# Patient Record
Sex: Male | Born: 1950
Health system: Southern US, Community
[De-identification: ages and names within clinical notes are randomized; demographics above are authoritative.]

## PROBLEM LIST (undated history)

## (undated) DIAGNOSIS — I259 Chronic ischemic heart disease, unspecified: Secondary | ICD-10-CM

## (undated) DIAGNOSIS — R079 Chest pain, unspecified: Secondary | ICD-10-CM

## (undated) DIAGNOSIS — E7800 Pure hypercholesterolemia, unspecified: Secondary | ICD-10-CM

## (undated) DIAGNOSIS — T8859XA Other complications of anesthesia, initial encounter: Secondary | ICD-10-CM

## (undated) DIAGNOSIS — E669 Obesity, unspecified: Secondary | ICD-10-CM

## (undated) DIAGNOSIS — I219 Acute myocardial infarction, unspecified: Secondary | ICD-10-CM

## (undated) DIAGNOSIS — E785 Hyperlipidemia, unspecified: Secondary | ICD-10-CM

## (undated) DIAGNOSIS — G4730 Sleep apnea, unspecified: Secondary | ICD-10-CM

## (undated) DIAGNOSIS — C801 Malignant (primary) neoplasm, unspecified: Secondary | ICD-10-CM

## (undated) DIAGNOSIS — I2510 Atherosclerotic heart disease of native coronary artery without angina pectoris: Secondary | ICD-10-CM

## (undated) DIAGNOSIS — I10 Essential (primary) hypertension: Secondary | ICD-10-CM

## (undated) HISTORY — DX: Hyperlipidemia, unspecified: E78.5

## (undated) HISTORY — DX: Sleep apnea, unspecified: G47.30

## (undated) HISTORY — DX: Chronic ischemic heart disease, unspecified: I25.9

## (undated) HISTORY — PX: HERNIA REPAIR: SHX51

## (undated) HISTORY — DX: Chest pain, unspecified: R07.9

## (undated) HISTORY — DX: Acute myocardial infarction, unspecified: I21.9

## (undated) HISTORY — DX: Pure hypercholesterolemia, unspecified: E78.00

## (undated) HISTORY — DX: Obesity, unspecified: E66.9

---

## 1999-06-07 ENCOUNTER — Emergency Department (HOSPITAL_COMMUNITY): Admission: EM | Admit: 1999-06-07 | Discharge: 1999-06-07 | Payer: Self-pay | Admitting: *Deleted

## 2002-09-07 DIAGNOSIS — I259 Chronic ischemic heart disease, unspecified: Secondary | ICD-10-CM

## 2002-09-07 HISTORY — DX: Chronic ischemic heart disease, unspecified: I25.9

## 2002-09-23 ENCOUNTER — Encounter: Payer: Self-pay | Admitting: Emergency Medicine

## 2002-09-23 ENCOUNTER — Inpatient Hospital Stay (HOSPITAL_COMMUNITY): Admission: EM | Admit: 2002-09-23 | Discharge: 2002-09-27 | Payer: Self-pay | Admitting: Emergency Medicine

## 2002-09-24 ENCOUNTER — Encounter: Payer: Self-pay | Admitting: Cardiology

## 2002-10-10 ENCOUNTER — Encounter (HOSPITAL_COMMUNITY): Admission: RE | Admit: 2002-10-10 | Discharge: 2003-01-08 | Payer: Self-pay | Admitting: Cardiology

## 2002-10-26 ENCOUNTER — Ambulatory Visit (HOSPITAL_COMMUNITY): Admission: RE | Admit: 2002-10-26 | Discharge: 2002-10-27 | Payer: Self-pay | Admitting: Cardiology

## 2003-01-09 ENCOUNTER — Encounter (HOSPITAL_COMMUNITY): Admission: RE | Admit: 2003-01-09 | Discharge: 2003-02-06 | Payer: Self-pay | Admitting: Cardiology

## 2003-11-28 ENCOUNTER — Ambulatory Visit (HOSPITAL_COMMUNITY): Admission: RE | Admit: 2003-11-28 | Discharge: 2003-11-28 | Payer: Self-pay | Admitting: Cardiology

## 2003-12-06 ENCOUNTER — Ambulatory Visit (HOSPITAL_BASED_OUTPATIENT_CLINIC_OR_DEPARTMENT_OTHER): Admission: RE | Admit: 2003-12-06 | Discharge: 2003-12-06 | Payer: Self-pay | Admitting: Family Medicine

## 2006-10-19 LAB — HM COLONOSCOPY

## 2006-11-18 ENCOUNTER — Ambulatory Visit (HOSPITAL_BASED_OUTPATIENT_CLINIC_OR_DEPARTMENT_OTHER): Admission: RE | Admit: 2006-11-18 | Discharge: 2006-11-18 | Payer: Self-pay | Admitting: General Surgery

## 2007-11-07 HISTORY — PX: CARDIAC CATHETERIZATION: SHX172

## 2007-11-25 ENCOUNTER — Inpatient Hospital Stay (HOSPITAL_BASED_OUTPATIENT_CLINIC_OR_DEPARTMENT_OTHER): Admission: RE | Admit: 2007-11-25 | Discharge: 2007-11-25 | Payer: Self-pay | Admitting: Cardiology

## 2007-11-29 ENCOUNTER — Inpatient Hospital Stay (HOSPITAL_COMMUNITY): Admission: RE | Admit: 2007-11-29 | Discharge: 2007-11-30 | Payer: Self-pay | Admitting: Cardiology

## 2010-01-28 ENCOUNTER — Ambulatory Visit: Payer: Self-pay | Admitting: Cardiology

## 2010-05-21 NOTE — Cardiovascular Report (Signed)
Keith Murillo, BERGDOLL            ACCOUNT NO.:  [id number]   MEDICAL RECORD NO.:  [id number]          PATIENT TYPE:  OIB   LOCATION:  1967                         FACILITY:  [facility]   PHYSICIAN:  Colleen Can. Deborah Chalk, M.D.DATE OF BIRTH:  August 21, 1950   DATE OF PROCEDURE:  DATE OF DISCHARGE:  11/25/2007                            CARDIAC CATHETERIZATION   PROCEDURE:  Left heart catheterization with selective coronary  angiography and left ventricular angiography.   TYPE AND SITE OF ENTRY:  Percutaneous right femoral artery.   CATHETERS:  4-French Judkins left coronary catheter, 4-French 3DRC right  coronary catheter, and 4-French pigtail ventriculography catheter.   CONTRAST MATERIAL:  Omnipaque.   MEDICATIONS GIVEN PRIOR TO PROCEDURE:  Valium 10 mg p.o.   MEDICATION GIVEN DURING THE PROCEDURE:  Versed 2 mg IV.   COMMENTS:  The patient tolerated the procedure well.   HEMODYNAMIC DATA:  The aortic pressure was 97/61, LV was 88/3-8.  There  was no aortic valve gradient.  There was no pullback.   ANGIOGRAPHIC DATA:  1. Left main coronary artery is normal.  2. Left circumflex.  The left circumflex has a 30-40% segmental      narrowing proximally.  The majority of the left circumflex      continues as a bifurcating obtuse marginal vessel with diffuse      irregularities but no significant focal stenosis.  The continuation      of the left circumflex is a moderately small vessel.  It has      diffuse 50-60% narrowing distally and one small branch has a 90%      narrowing.  These vessels would be too small to be a candidate for      angioplasty or possible bypass surgery.  3. Left anterior descending.  The left anterior descending has a stent      in its proximal segment.  The stent is widely patent.  In the      midportion of the vessel, there is a discrete area of 90% stenosis      which is approximately 10 mm in length.  The distal portion of the      left anterior descending is  considerably smaller than the more      proximal portion at this location, so, it would be suitable for a      stent placement.  The left anterior descending does have diffuse      disease distally as it crosses the apex.  4. Right coronary artery.  The right coronary artery is a small      dominant vessel.  There was a patent stent proximally.  There are      diffuse irregularities in the right coronary artery up to the crux.      Distally, the vessels are quite small and are diffusely diseased.      There was severe stenosis and a small 1-mm acute marginal vessel.      This would be approximately 90% stenotic but would not be suitable      for angioplasty or consideration for bypass grafting.   Left ventricular angiogram.  The left ventricular angiogram was  performed in an RAO position.  Overall cardiac size and silhouette are  normal, the global ejection fraction is estimated at 55% range.  There  is possibly some mild anterior hypokinesis.  There is no mitral  regurgitation noted.   OVERALL IMPRESSION:  1. Normal left ventricular function with mild anterior hypokinesis.  2. Severe three-vessel coronary artery disease involving mainly the      distal stenosis but with a severe stenosis in midportion of the      left anterior descending that would be approachable with      angioplasty and with patent stents in the proximal right coronary      artery and proximal left anterior descending.   DISCUSSION:  Phylliss Bob will need to be managed medically in general, but we  do have the opportunity for a stent placement in the mid left anterior  descending.  At least a large part of the issue does remain distal  coronary atherosclerosis.      Colleen Can. Deborah Chalk, M.D.  Electronically Signed     SNT/MEDQ  D:  11/25/2007  T:  11/26/2007  Job:  [id number]

## 2010-05-21 NOTE — H&P (Signed)
Keith Murillo, Keith Murillo            ACCOUNT NO.:  [id number]   MEDICAL RECORD NO.:  [id number]          PATIENT TYPE:  OIB   LOCATION:                               FACILITY:  [facility]   PHYSICIAN:  Colleen Can. Deborah Chalk, M.D.DATE OF BIRTH:  [birth year]   DATE OF ADMISSION:  11/29/2007  DATE OF DISCHARGE:                              HISTORY & PHYSICAL   CHIEF COMPLAINT:  Chest pain.   HISTORY OF PRESENT ILLNESS:  Keith Murillo is a 60 year old white male  who is referred for attempts at angioplasty.  He has undergone recent  cardiac catheterization on November 25, 2007.  He has known distal  severe 3-vessel disease but also has a discrete area of 90% stenosis in  the midportion of the left anterior descending.  Clinically, he has had  several week history of exertional chest pain.  He is now referred for  attempts at percutaneous coronary intervention.   PAST MEDICAL HISTORY:  1. Known ischemic heart disease with a non-Q-wave MI in September      2004.  He has had subsequent stenting of the right coronary as well      as LAD.  His last catheterization was on November 25, 2007, which      showed normal LV function with mild anterior hypokinesia and severe      3-vessel coronary disease, involving mainly the distal coronaries      but with severe stenosis in the midportion of the left anterior      descending.  2. Hyperlipidemia.  3. Chronic anticoagulation with Plavix.  4. Sleep apnea.  5. Obesity.   Allergies are none.   CURRENT MEDICATIONS:  1. Plavix 75 mg a day.  2. Toprol-XL 50 mg a day.  3. Aspirin daily.  4. Nitroglycerine p.r.n.  5. Lisinopril 10 mg a day.  6. Norvasc 5 mg a day.  7. Multivitamin daily.   Family history is positive for coronary artery disease.   SOCIAL HISTORY:  He is married.  He has no current alcohol or tobacco  use.   REVIEW OF SYSTEMS:  He tolerated cardiac catheterization without  problems and had no problems with his groin.  He has had no  fever, flu,  or cough.  His chest pain is as described above and all other review of  systems are negative.   PHYSICAL EXAMINATION:  GENERAL:  He is in no acute distress.  He is pain  free.  VITAL SIGNS:  Weight is 223.5 pounds; blood pressure is 110/80 sitting,  114/80 standing; heart rate 59 and regular; respirations 18.  He is  afebrile.  HEENT:  Normocephalic and atraumatic.  NECK:  Supple.  No JVD.  LUNGS:  Clear.  CARDIAC:  Regular rhythm.  SKIN:  Warm and dry.  Color is unremarkable.  ABDOMEN:  Obese and soft, positive bowel sounds, and nontender.  EXTREMITIES:  Without edema.  Distal pulses are intact.  NEUROLOGIC:  No gross focal deficits.   Pertinent labs are pending.   OVERALL IMPRESSION:  1. Chest pain with recent cardiac catheterization revealing a discrete      lesion  in the mid left anterior descending in the setting of,      otherwise, severe 3-vessel distal coronary disease.  2. Previous non-Q-wave infarction with stents in the left anterior      descending and right coronary.  3. Hyperlipidemia.  4. Hypertension.  5. Sleep apnea.  6. Obesity.   PLAN:  We will proceed on with attempts at percutaneous coronary  intervention on Monday, November 29, 2007.  The procedure has been  discussed in full detail including the risks and benefits, and he is  willing to proceed.      Sharlee Blew, N.P.      Colleen Can. Deborah Chalk, M.D.  Electronically Signed    LC/MEDQ  D:  11/26/2007  T:  11/27/2007  Job:  [id number]

## 2010-05-21 NOTE — Cardiovascular Report (Signed)
[name], Keith            ACCOUNT NO.:  [id number]   MEDICAL RECORD NO.:  [id number]          PATIENT TYPE:  INP   LOCATION:  6531                         FACILITY:  [facility]   PHYSICIAN:  Colleen Can. Deborah Chalk, M.D.DATE OF BIRTH:  05-22-1950   DATE OF PROCEDURE:  11/29/2007  DATE OF DISCHARGE:                            CARDIAC CATHETERIZATION   PROCEDURE:  Stent placement in the left anterior descending with a 23 x  2.75 mm Promus (drug-eluting stent).   PROCEDURE:  The 6-French sheaths were placed in the right femoral  artery.  Subsequent followup shots at the end of the procedure  demonstrated the puncture site was at the bifurcation point between the  superficial and profunda femoris.  No AngioSeal was performed after the  procedure.   The JL4 guide supplied adequate backup.  The Prowater guidewire was  passed easily into the distal left anterior descending.  We predilated  it with 2.5 x 15 mm apex balloon.  We then returned with a 2.75 x 23 mm  Promus (drug-eluting stent) inflated to 14 atmospheres.  We did not have  full expansion of the stent in the midportion.  We returned with a 2.75  x 15 mm Maverick balloon inflated to maximum of 18 atmospheres with full  expansion of the balloon.  The final angiographic result was taken after  intracoronary nitroglycerin.  This demonstrated a nice smooth contour of  the stent and adequate proximal taper.  Distally, the diffuse distal  disease began, but it was felt that it was a satisfactory outflow from  the stent into the distal left anterior descending.  The 90% stenosis  was reduced to 0% with only residual distal disease.  The patient  tolerated the procedure well.   ANESTHESIA:  Local Xylocaine anesthesia with 10 mg of Valium p.o. and 2  mg of Versed IV.   ANTICOAGULANTS:  Angiomax and Plavix given prior to the procedure.      Colleen Can. Deborah Chalk, M.D.  Electronically Signed     SNT/MEDQ  D:  11/29/2007  T:   11/30/2007  Job:  [id number]

## 2010-05-21 NOTE — Discharge Summary (Signed)
NAMEOAK, DOREY            ACCOUNT NO.:  [id number]   MEDICAL RECORD NO.:  [id number]          PATIENT TYPE:  INP   LOCATION:  6531                         FACILITY:  [facility]   PHYSICIAN:  Colleen Can. Deborah Chalk, M.D.DATE OF BIRTH:  10-05-1950   DATE OF ADMISSION:  11/29/2007  DATE OF DISCHARGE:  11/30/2007                               DISCHARGE SUMMARY   DISCHARGE DIAGNOSES:  1. Chest pain with recent cardiac catheterization showing distal      severe three-vessel disease but also a discrete area of 90%      stenosis in the midportion of the left anterior descending, now      status post stent placement with a 2.75 x 23-mm Promus (drug-      eluting) stent.  2. Known ischemic heart disease with a non-Q-wave myocardial      infarction in September 2004.  3. Hyperlipidemia.  4. Obesity.  5. Chronic anticoagulation with Plavix.  6. Sleep apnea.   HISTORY OF PRESENT ILLNESS:  The patient is a 60 year old white male who  was referred for percutaneous coronary intervention.  He has had recent  cardiac catheterization on November 25, 2007, due to several weeks of  exertional chest pain.   Please see the history and physical for further patient presentation and  profile.   LABORATORY DATA:  CBC was normal.  Chemistries were normal.  PT and PTT  were unremarkable.   HOSPITAL COURSE:  The patient was admitted electively.  He had already  been maintained on chronic Plavix, which was continued.  Percutaneous  coronary intervention was carried out to the lesion in the mid left  anterior descending.  A 2.75 x 23-mm Promus stent was placed and overall  satisfactory result was obtained.  Postprocedure, he was transferred to  6500 and today on November 30, 2007, he is doing well without  complaints.  Physical exam is unremarkable.  Postprocedure lab is  satisfactory, and he is felt to be a stable candidate for discharge  today.   DISCHARGE CONDITION:  Stable.   DISCHARGE DIET:   Low-salt heart-healthy.   He is to use an ice pack if needed to the groin.   DISCHARGE MEDICINES:  1. Plavix 75 mg a day.  2. Toprol-XL 50 mg a day.  3. Aspirin 325 a day.  4. Lisinopril 10 mg a day.  5. Norvasc 5 mg a day.  6. Multivitamin daily.  7. Nitroglycerin p.r.n.  All of his medicines are the same with no      changes made.   We will plan on seeing him back in the office in 1 week, certainly  sooner if problems arise in the interim.      Sharlee Blew, N.P.      Colleen Can. Deborah Chalk, M.D.  Electronically Signed    LC/MEDQ  D:  11/30/2007  T:  11/30/2007  Job:  [id number]

## 2010-05-21 NOTE — Op Note (Signed)
Keith Murillo, Keith Murillo            ACCOUNT NO.:  [id number]   MEDICAL RECORD NO.:  [id number]          PATIENT TYPE:  AMB   LOCATION:  [facility]                         FACILITY:  [facility]   PHYSICIAN:  Timothy E. Earlene Plater, M.D. DATE OF BIRTH:  04-Sep-1950   DATE OF PROCEDURE:  11/18/2006  DATE OF DISCHARGE:                               OPERATIVE REPORT   PREOPERATIVE DIAGNOSES:  Fistula in ano.   POSTOPERATIVE DIAGNOSES:  Fistula in ano.   PROCEDURE:  Fistulotomy.   SURGEON:  Timothy E. Earlene Plater, M.D.   ANESTHESIA:  General.   INDICATIONS FOR PROCEDURE:  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  He was seen, identified and  again I discussed the procedure with him his wife.   DESCRIPTION OF PROCEDURE:  He was taken to the operating room, placed  supine.  LMA anesthesia provided.  He was placed in lithotomy.  The  perianal area was inspected, prepped and draped in usual fashion.  The  fistula was simple and superficial.  It exited the right anterior  perianal skin.  A fistula probe gently applied, ran through the fistula  and into the anoderm to the very distal tip of the anterior midline  papillae.  The skin over this fistula was opened, incised and sides and  walls debrided and then the entire fistulous tract cauterized.  There  was no bleeding or complication.  This was the only pathology.  Dry  sterile dressing applied over Gelfoam.   Written and verbal instructions given including Vicodin.  To be seen and  followed as an outpatient.      Timothy E. Earlene Plater, M.D.  Electronically Signed     TED/MEDQ  D:  11/18/2006  T:  11/19/2006  Job:  [id number]   cc:   Oley Balm. Georgina Pillion, M.D.  Fax: [phone]

## 2010-05-21 NOTE — H&P (Signed)
NAMEHASSANI, SLINEY            ACCOUNT NO.:  [id number]   MEDICAL RECORD NO.:  [id number]           PATIENT TYPE:   LOCATION:                                 FACILITY:   PHYSICIAN:  Colleen Can. Deborah Chalk, M.D.DATE OF BIRTH:  1950-08-11   DATE OF ADMISSION:  DATE OF DISCHARGE:                              HISTORY & PHYSICAL   CHIEF COMPLAINT:  Chest pain.   HISTORY OF PRESENT ILLNESS:  [name] is a 60 year old white male  who has known ischemic heart disease.  He had his last catheterization  in 2005.  He has had previous stents placed to the LAD as well as the  right coronary artery.  He was seen on November 22, 2007, for his  regular followup appointment.  At that time, he was reporting several-  week history of chest pain that was exertional in nature.  He notes that  when he starts exercising, his heart rate will increase and then he will  have left substernal discomfort.  He has also had discomfort while  mowing the grass.  He has had relief with rest.  He has had some  symptoms actually at rest.  In light of his known ischemic heart  disease, he is now referred for repeat cardiac catheterization.   PAST MEDICAL HISTORY:  1. Known ischemic heart disease.  He had a non-Q-wave MI in September      2004 and had angioplasty and stenting of the right coronary      artery/posterior descending.  He had stenting of the LAD and the      right coronary artery in October 2004.  Last catheterization was in      November 2005, which showed mild LV dysfunction with very minimal      anterior hypokinesia, three-vessel coronary artery disease with      patent stents in the LAD and right coronary but with distal disease      involving the acute marginal vessel, the right coronary, distal      right coronary, distal continuation branch of the left circumflex      and distal LAD.  He is since that time continued with medical      management.  2. Hyperlipidemia.  He is currently off of all  statin therapy.  3. Chronic anticoagulation with Plavix.  4. Sleep apnea.  5. Obesity.   ALLERGIES:  None.   CURRENT MEDICATIONS:  1. Plavix 75 mg a day.  2. Toprol-XL 50 mg a day.  3. Aspirin daily.  4. Nitroglycerin p.r.n.  5. Lisinopril 10 mg a day.  6. Norvasc 5 mg a day.  7. Multivitamin daily.   Family history is positive for coronary artery disease.   SOCIAL HISTORY:  He is married.  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  He has no current alcohol or tobacco  use.   REVIEW OF SYSTEMS:  He has had no recent fever, flu or cough.  His chest  discomfort is as described above.  He has had some fatigue at night.  He  is off of his  statin therapy due to myalgia which has been a chronic  problem.  He has had no abdominal pain, constipation, or diarrhea.  He  denies any recent nausea or vomiting.  He has had some hematuria after  exercise three to four times over the past year and all other review of  systems are negative.   PHYSICAL EXAMINATION:  GENERAL:  He is a pleasant, he is in no acute  distress.  He is currently pain free.  VITAL SIGNS:  Weight is 223.5 pounds, blood pressure 110/80 sitting,  114/80 standing, heart rate is 59 and regular, respirations 18, he is  afebrile.  SKIN:  Warm and dry.  Color is unremarkable.  HEENT:  Pupils are equal and reactive to light.  NECK:  Supple.  LUNGS:  Clear.  CARDIAC:  Regular rhythm.  There is no murmur, rub or gallop.  ABDOMEN:  Obese yet soft, positive bowel sounds, and nontender.  EXTREMITIES:  Without edema.  Distal pulses are intact.  NEUROLOGIC:  No gross focal deficits.   His EKG shows normal sinus rhythm with no acute changes.  His other labs  are pending.   OVERALL IMPRESSION:  1. Chest pain.  2. Known ischemic heart disease with previous non-Q-wave myocardial      infarction.  He does have stents in the left anterior descending      and right coronary.  3. Hyperlipidemia  currently off therapy.  4. Obesity.  5. Sleep apnea.   PLAN:  We will proceed on with diagnostic cardiac catheterization.  The  risks of the procedure and benefits have been reviewed and he is willing  to proceed on Thursday, November 25, 2007.      Sharlee Blew, N.P.      Colleen Can. Deborah Chalk, M.D.  Electronically Signed    LC/MEDQ  D:  11/22/2007  T:  11/23/2007  Job:  [id number]   cc:   Oley Balm. Georgina Pillion, M.D.

## 2010-05-24 NOTE — Cardiovascular Report (Signed)
Keith Murillo, Keith Murillo            ACCOUNT NO.:  [id number]   MEDICAL RECORD NO.:  [id number]          PATIENT TYPE:  OIB   LOCATION:  2899                         FACILITY:  [facility]   PHYSICIAN:  Colleen Can. Deborah Chalk, M.D.DATE OF BIRTH:  [birth year]   DATE OF PROCEDURE:  11/28/2003  DATE OF DISCHARGE:                              CARDIAC CATHETERIZATION   Keith Murillo presents with progressive anginal pain.  He has had  previous stents in the left anterior descending as well as right coronary  artery and is referred now for repeat catheterization.   PROCEDURE:  Left heart catheterization with selective coronary angiography,  left ventricular angiography.   SURGEON:   TYPE AND SITE OF ENTRY:  Percutaneous right femoral artery.   CATHETER:  A 6 French 4 curve Judkins right and left coronary catheters, a 6  French pigtail ventriculography catheter.   CONTRAST MATERIAL:  Omnipaque.   MEDICATIONS GIVEN PRIOR TO THE PROCEDURE:  Valium 10 mg p.o.   MEDICATIONS GIVEN DURING THE PROCEDURE:  Versed 2 mg IV.   COMMENTS:  The patient tolerated the procedure well.   HEMODYNAMIC DATA:  The aortic pressure was 112/79, LV was 112/5-12.  There  is no aortic valve gradient.  There is no pullback.   ANGIOGRAPHIC DATA:  1.  Left main coronary artery is normal.  2.  Left circumflex continues as three major branches.  The obtuse marginal      vessel bifurcates into two relatively large branches on the      posterolateral wall.  The continuation of the left circumflex and AV      groove has moderate severe distal disease of 70 to 80% nature.  It is in      1.5 to 2 mm vessels distally.  The obtuse marginal has somewhat      aneurysmal segment in the proximal portion before bifurcation in two      large branches.  These large branches are relatively free of disease.  3.  Left anterior descending:  The left anterior descending has a patent      stent proximally.  There is diffuse disease  in the left anterior      descending and at its terminal portion as it wraps around the apex.      There is 70% distal narrowing.  It is somewhat diffuse distally.  There      is atherosclerosis in a relatively large septal perforating branch.  4.  Right coronary artery:  The right coronary artery is a small dominant      vessel.  There is a patent stent proximally.  In the mid portion of the      vessel, there is a 30 to 40% narrowing.  There is a small acute marginal      vessel that has a 90% stenosis.  It is a 1.5 to 2 mm vessel.  The distal      posterior descending and posterolateral branch have diffuse disease but      are relatively small.  Just prior to the crux, there appears to be 70%  narrowing.   Left ventricular angiogram is performed in the RAO position.  Overall  cardiac size and silhouette are normal.  The global ejection fraction is  50%.  There is very mild anterior hypokinesis in the mid portion.   IMPRESSION:  1.  Mild left ventricular dysfunction with very minimal anterior      hypokinesia.  2.  Three vessel coronary disease with patent stents in the left anterior      descending and right coronary artery but with distal disease involving      the acute marginal vessel, the right coronary artery, distal right      coronary artery, distal continuation branch of the left circumflex, and      distal left anterior descending as we cross the apex.   DISCUSSION:  These findings would suggest the best options are medical  management.       SNT/MEDQ  D:  11/28/2003  T:  11/28/2003  Job:  [id number]

## 2010-05-24 NOTE — H&P (Signed)
NAMEBELMONT, VALLI            ACCOUNT NO.:  [id number]   MEDICAL RECORD NO.:  [id number]          PATIENT TYPE:  OIB   LOCATION:                               FACILITY:  [facility]   PHYSICIAN:  Colleen Can. Deborah Chalk, M.D.DATE OF BIRTH:  October 05, 1950   DATE OF ADMISSION:  11/28/2003  DATE OF DISCHARGE:                                HISTORY & PHYSICAL   CHIEF COMPLAINT:  Chest pain.   HISTORY OF PRESENT ILLNESS:  Mr. Capote is a 60 year old male who has a  known history of extensive coronary disease.  He has had previous stent  placed to the proximal LAD with sequential stents placed to the right  coronary in October 2004.  He has a known history of dyslipidemia, obesity,  as well as a sedentary lifestyle.  He presented to our office on November 24, 2003, for evaluation of chest tightness.  This has been occurring with  activity and is clearly exacerbated by stress.  He has had significant  difficulty concentrating during the daytime hours.  He feels quite sleepy.  He does have an upcoming sleep study planned for later on this month at  [facility].  He is not using nitroglycerin, but he is concerned  about his chest pain syndrome as well as his overall prognosis.  He is  subsequently referred for cardiac catheterization.   PAST MEDICAL HISTORY:  1.  Atherosclerotic cardiovascular disease with previous history of non-Q-MI      in September 2004, with noted severe diffuse three-vessel disease with      subsequent angioplasty in the right coronary artery.  He was referred      for repeat catheterization with subsequent stents placed to the LAD with      a 3.5 x 18 mm Cypher stent, and subsequent 3 x 18 mm stent to the right      coronary in October 2004.  2.  Obesity.  3.  Hyperlipidemia.  4.  Sedentary lifestyle.  5.  Anxiety.   ALLERGIES:  No known drug allergies.   INTOLERANCES:  CODEINE which causes nausea.   CURRENT MEDICATIONS:  1.  Lisinopril 10 mg daily.  2.   Aspirin daily.  3.  Lipitor 20 mg daily.  4.  Toprol XL 50 mg daily.   FAMILY HISTORY:  Unchanged from the prior record.   SOCIAL HISTORY:  He is married.  He works for [employer] in [employer].  He has no current tobacco use and social alcohol use.  Unfortunately, he is  not exercising regularly.   REVIEW OF SYSTEMS:  Basically as noted above.  He has been under a  considerable amount of stress at work, and there is concern there for the  capability to do his job effectively.  He notes a poor sense of  concentration and a generalized feeling of fatigue.  He remains drowsy  throughout the course of the day.  His wife does note that he does snore and  has periods of apnea at night.  He is scheduled for a sleep study test later  on this month at [facility].  His chest tightness is described  more as a burning-like sensation.  He has not used nitroglycerin, and a lot  of his discomfort does occur with activity and subsequently resolves.   PHYSICAL EXAMINATION:  GENERAL:  He is a middle-aged white male in no acute  distress.  VITAL SIGNS:  Blood pressure is 130/80 sitting, 120/80 standing.  Heart rate  is 72.  LUNGS:  Clear.  HEART:  Regular rhythm.  ABDOMEN:  Soft, positive bowel sounds, obese, nontender.  EXTREMITIES:  Without edema.  NEUROLOGIC:  Intact.  There are no gross focal deficits.   LABORATORY DATA:  Pending.   IMPRESSION:  1.  Recurrent bouts of chest pain of uncertain etiology.  2.  Extensive history of known coronary disease with previous non-Q-wave      myocardial infarction in 2004, and subsequent revascularization to      include stents to the left anterior descending as well as right coronary      artery.  He does have coronary disease in a diffuse manner.  3.  Hyperlipidemia.  4.  Obesity.  5.  Sedentary lifestyle.   PLAN:  We will proceed on with repeat catheterization.  The procedure has  been reviewed in full detail with both he and his wife,  including the risks  and benefits, as well as possibility of being allergic to the x-ray dye,  having blood clots to the leg, as well as the serious complications to  include heart attack, irregular rhythm, stroke, and even the possibility of  death have all been discussed, and he is willing to proceed on Tuesday,  November 28, 2003.       ________________________________________  Sharlee Blew, N.P.  ___________________________________________  Colleen Can. Deborah Chalk, M.D.    LC/MEDQ  D:  11/27/2003  T:  11/27/2003  Job:  [id number]   cc:   Oley Balm. Georgina Pillion, M.D.  [address]  [place]  [place] [address]  Fax: [phone]

## 2010-05-24 NOTE — Discharge Summary (Signed)
NAME:  Keith Murillo, Keith Murillo                      ACCOUNT NO.:  [id number]   MEDICAL RECORD NO.:  [id number]                   PATIENT TYPE:  INP   LOCATION:  6531                                 FACILITY:  [facility]   PHYSICIAN:  Peter M. Swaziland, M.D.               DATE OF BIRTH:  03-Dec-1950   DATE OF ADMISSION:  09/23/2002  DATE OF DISCHARGE:  09/27/2002                                 DISCHARGE SUMMARY   HISTORY OF PRESENT ILLNESS:  Keith Murillo is a 60 year old male who  presented with new onset of angina the night prior to admission.  This was  associated with nausea and diaphoresis.  He complains of increasing fatigue  over the past few weeks.  He subsequently was referred to the emergency room  for evaluation of his chest pain.  The patient does have a history of  borderline hypertension, hyperlipidemia.  For details of his past medical  history, social history, family history, and physical exam, please see  admission history and physical.   LABORATORY DATA:  His resting ECG showed normal sinus rhythm with a normal  ECG.   His chest x-ray showed no active disease.   CMET was normal except for an AST of 42.  Glucose was normal at 97.  Coagulations were normal.  CBC was normal.  TSH was 1.907.  Point of care:  Cardiac enzymes revealed an elevated troponin of 2.1, then 2.53, then 1.68.  CPK-MB went from 14.3 to 14.7 and then 13.6.  Myoglobins were normal.  Subsequent CK-MB was 8.2, troponin of 0.96.  Cholesterol panel:  Cholesterol  233, triglycerides were 307, HDL 54, and LDL of 118.   HOSPITAL COURSE:  The patient was admitted to telemetry.  He was  anticoagulated with IV heparin.  Serial ECGs showed no significant change,  but cardiac enzymes were consistent with a non-Q-wave myocardial infarction.  He was also started on IV nitroglycerin, Lipitor, and oral Lopressor.  He  had no subsequent chest pain.  On September 26, 2002, he underwent cardiac  catheterization.  This  demonstrated diffuse three-vessel coronary disease.  He had moderate disease in the LAD at the first septal perforator,  approximately 60%.  He had diffuse 70-80% stenosis distally.  Left  circumflex showed a large first marginal branch which had a 50-70% ostial  stenosis.  The right coronary artery was diffusely diseased with 70%  stenosis in the mid vessel followed by 90% stenosis at the crux.  The PDA  was subtotally occluded proximally.  Left ventricular function was normal.  The patient underwent successful angioplasty of the right coronary artery  using a 2.5 x 18 mm POWERSAIL balloon in the mid vessel.  The PDA was opened  using a 2 x 15 mm Voyager balloon.  This reestablished TIMI grade 3 flow.  The vessel was still diffusely diseased with scattered 20-30%  irregularities.  Given the diffuse nature of the disease and  small vessels,  we did not stent this segment.  It was also difficult to cross the mid right  coronary artery due to the tortuosity of the vessel.  The patient did well  following this, with no recurrent chest pain.  The ECG remained stable.  He  was discharged home in stable condition on September 27, 2002.   DISCHARGE DIAGNOSES:  1. Non-Q-wave myocardial infarction.  2. Diffuse coronary artery disease, status post successful angioplasty of     the mid right coronary artery and posterior descending artery.  3. Hypercholesterolemia.  4. Obesity.   PLAN:  The patient will be discharged to home.  He will follow up with Dr.  Deborah Chalk in one week.  He is to avoid lifting or straining for five days.  He  is not to return to work until discussed with Dr. Deborah Chalk.  He was referred  to phase II cardiac rehabilitation.   DISCHARGE MEDICATIONS:  1. Coated aspirin daily.  2. Toprol-XL 50 mg per day.  3. Plavix 75 mg daily.  4. Lipitor 20 mg per day.  5. Nitroglycerin p.r.n.   DISCHARGE STATUS:  Improved.                                                Peter M. Swaziland,  M.D.    PMJ/MEDQ  D:  09/27/2002  T:  09/28/2002  Job:  [id number]   cc:   Oley Balm. Georgina Pillion, M.D.  [address]  [place]  [place] [address]  Fax: [phone]

## 2010-05-24 NOTE — Cardiovascular Report (Signed)
NAME:  AMAREON, PHUNG                      ACCOUNT NO.:  [id number]   MEDICAL RECORD NO.:  [id number]                   PATIENT TYPE:  OIB   LOCATION:  6523                                 FACILITY:  [facility]   PHYSICIAN:  Colleen Can. Deborah Chalk, M.D.            DATE OF BIRTH:  25-Sep-1950   DATE OF PROCEDURE:  10/26/2002  DATE OF DISCHARGE:                              CARDIAC CATHETERIZATION   PROCEDURE PERFORMED:  Stent placement in the proximal left anterior  descending with sequential stents placed in the right coronary artery.   CARDIOLOGIST:  Colleen Can. Deborah Chalk, M.D.   TYPE AND SITE OF ENTRY:  Percutaneous right femoral artery.   CATHETERS:  Six [place] JR-4 diagnostic catheter.  Seven [place] FL4 guide  catheter.  JR-4 with side holes guide catheter.  Hi-Torque floppy guide  wires.  Initial stent placement in the left anterior descending with a 3.0 x  18 mm CYPHER proximally with stent placement subsequent with dilatation of  the septal perforating branch with a 2.0 x 15 mm Maverick balloon that arose  within the stent and then subsequent sequential stents in the right coronary  artery.   MEDICATIONS:  Medication given prior to the procedure;  Valium 10 mg p.o..   Medication given during the procedure;  Versed 3 mg IV, heparin and  Integrilin.   COMMENTS:  The patient tolerated the procedure well.   DESCRIPTION OF PROCEDURE:  We initially took pictures of the right coronary  artery.  There was diffuse disease including severe proximal disease as well  as the diffuse disease distally.  We elected to proceed on with the planned  intervention on the proximal left anterior descending.   The JL-4 provided satisfactory backup.  The Hi-Torque floppy guide wire was  passed across the severe stenosis in the left anterior descending.  We then  returned and positioned a 3.5 x 18 mm CYPHER stent across the severe  stenosis in the LAD.  It was inflated to a maximum of 16 atmospheres.   The  final angiograpic result was felt to be satisfactory.  In the proximal  portions of the stent the vessel was felt to be larger than 3.5 mm and we  returned with a 4.0 x 9 mm Maverick balloon and it was inflated to a maximum  of 16 atmospheres.  We then elected to place the guide wire across the large  septal perforating branch, which had been compromised during the stent  placement.  A 2.0 x 15 mm Maverick balloon was positioned across the  stenosis and inflated to a maximum, and an excellent result was obtained.   We then turned our attention to the right coronary artery.  A JR-4 guide  with side holes provided adequate, but not excellent, backup.  Hi-Torque  floppy guide wire was positioned across the stenosis.  We initially  predilated with a 2.5 x 15 mm balloon proximally and distally.  We then  returned with the attempt to position the 2.5 x 13 mm CYPHER stent distally  and were unable to pass it across the more proximal lesion.  We finally  returned with a 3.0 x 15 mm Quantum Maverick balloon proximally and  predilated, then we were able to position the 3.0 x 18 mm CYPHER stent  proximally and it was inflated to a maximum of 17 atmospheres with a  satisfactory result.  We then attempted to pass the stent across the distal  stenosis.  We had to return and predilate that again with a 2.5 x 15 mm  Maverick balloon.  We then placed the 2.5 x 13 mm CYPHER stent distally and  inflated it to a maximum of 20 atmospheres; however, we had persistent  wasting.  We returned with the 2.5 x 15 mm balloon and then subsequently a  3.0 x 8 mm Quantum Maverick in the midportion of the vessel.  Finally, after  dilatation to greater than 26 atmospheres we had wasting of a defect within  the confines of the stent and satisfactory vessel expansion within the  stent.  The distal vessel remained patent with excellent flow.   Final angiographic result showed satisfactory patency of the proximal and   midportion of the vessel with diffuse disease distally, but patent distal  vessel with satisfactory stent placement also in the proximal left anterior  descending.   It is felt we will need to proceed on with a course of aggressive medical  management because of the rather extensive and diffuse coronary  atherosclerosis in Mr. Kann.                                                 Colleen Can. Deborah Chalk, M.D.    SNT/MEDQ  D:  10/26/2002  T:  10/26/2002  Job:  [id number]

## 2010-05-24 NOTE — H&P (Signed)
NAME:  Keith Murillo, Keith Murillo                      ACCOUNT NO.:  [id number]   MEDICAL RECORD NO.:  [id number]                   PATIENT TYPE:  OIB   LOCATION:                                       FACILITY:  [facility]   PHYSICIAN:  Colleen Can. Deborah Chalk, M.D.            DATE OF BIRTH:  1950/03/17   DATE OF ADMISSION:  10/26/2002  DATE OF DISCHARGE:                                HISTORY & PHYSICAL   CHIEF COMPLAINT:  None.   HISTORY OF PRESENT ILLNESS:  Keith Murillo is a 60 year old male who has  known coronary disease.  He had a previous non-Q-wave myocardial infarction  towards the mid-part of September.  He had angioplasty of the mid and right  coronary as well as the posterior descending.  He has significant residual  disease.  He now presents for elective attempts at percutaneous coronary  intervention to the LAD.  Clinically, he has done well without complaints of  chest pain or shortness of breath.  He is tolerating cardiac rehab without  problems.   PAST MEDICAL HISTORY:  1. Non-Q-wave MI, September 23, 2002, with moderate disease in the LAD at     the first septal perforator branch of approximately 60%, diffuse 70-80%     stenosis distally, left circumflex with a large first marginal branch     with 50-70% ostial disease, diffuse disease in the right coronary and 70%     in the mid-vessel, followed by 90% at the crux.  The PDA was subtotally     occluded proximally.  He subsequently underwent successful angioplasty of     the right coronary as well as the PDA.  LV function was normal.  2. Hypercholesterolemia.  3. Obesity.   ALLERGIES:  None.   CURRENT MEDICINES:  1. Aspirin daily.  2. Plavix 75 mg daily.  3. Toprol-XL 50 mg daily.  4. Lipitor 20 mg daily.  5. Nitroglycerin as needed (p.r.n.).   FAMILY HISTORY:  Family history unchanged.   SOCIAL HISTORY:  Social history unchanged.   REVIEW OF SYSTEMS:  Review of systems is basically as noted above and is  otherwise  unremarkable.   PHYSICAL EXAMINATION:  GENERAL:  On exam, he is currently in no acute  distress.  VITAL SIGNS:  Blood pressure is 130/70, sitting; 120/80, standing.  Heart  rate is 70 and regular.  Respirations are 18.  He is afebrile.  SKIN:  Skin is warm and dry.  Color is unremarkable.  LUNGS:  Lungs are basically clear.  HEART:  Heart shows a regular rhythm.  ABDOMEN:  Abdomen is obese yet soft with positive bowel sounds and  nontender.  EXTREMITIES:  Extremities are without edema.  NEUROLOGIC:  Neurologic shows no gross focal deficit.   PERTINENT LABORATORIES:  Pertinent labs are pending.   OVERALL IMPRESSION:  1. Recent non-Q-wave myocardial infarction with previous angioplasty to the     right coronary as well  as posterior descending, with known residual     disease.  2. Hyperlipidemia.  3. Obesity.   PLAN:  We will proceed on with attempts at further revascularization.  The  procedure was reviewed in full detail with the patient and he is willing to  proceed on Wednesday, October 26, 2002.      Juanell Fairly C. Earl Gala, N.P.                 Colleen Can. Deborah Chalk, M.D.    LCO/MEDQ  D:  10/21/2002  T:  10/21/2002  Job:  [id number]   cc:   Oley Balm. Georgina Pillion, M.D.  [address]  [place]  [place] [address]  Fax: [phone]

## 2010-05-24 NOTE — Procedures (Signed)
NAME:  Keith Murillo, Keith Murillo            ACCOUNT NO.:  [id number]   MEDICAL RECORD NO.:  [id number]          PATIENT TYPE:  OUT   LOCATION:  SLEEP CENTER                 [facility]ACILITY[facility]  [facility]   PHYSICIAN:  Clinton D. Maple Hudson, M.D. DATE OF BIRTH:  1950-12-16   DATE OF STUDY:  12/06/2003                              NOCTURNAL POLYSOMNOGRAM   REFERRING PHYSICIAN:  Schuyler Amor, M.D.   INDICATION FOR STUDY AND HISTORY:  Hypersomnia with sleep apnea.   Epworth sleepiness score 17/24, neck size 17 inches, BMI 37, weight 245  pounds.   RESPIRATORY DATA:  Split study protocol.  RDI 21.5 per hour indicating  moderate obstructive sleep apnea/hypopnea before CPAP.  This included 2  obstructive apneas and 47 hypopneas before CPAP.  Events were not  positional.  REM RDI 27.4 per hour.  CPAP was titrated at 13 CWP, RDI 7.8  per hour.  Technician indicated some difficulty with titration because the  patient had problems maintaining sleep and was restless.  A medium  Respironics Comfort Gel nasal mask with humidifier was used.   OXYGEN DATA:  Moderate snoring with oxygen desaturation to a nadir of 91%  before CPAP. After CPAP control saturation held 96 to 97%.   CARDIAC DATA:  Sinus rhythm with occasional PVC.   MOVEMENT AND PARASOMNIA:  None.   IMPRESSION AND RECOMMENDATIONS:  Moderate obstructive sleep apnea/hypopnea  syndrome, RDI 21.5 per hour with desaturation to 91%.  CPAP was titrated to  an initial suggested trial pressure of 13 CWP, RDI 7.8 per hour.  Some  outpatient adjustment may be necessary to find the best tolerated/most  effective pressure setting.  This can be reevaluated if need be.  A medium  Respironics Comfort Gel nasal mask was used with heated humidifier   Clinton D. Maple Hudson, M.D.  Diplomate, Biomedical engineer of Sleep Medicine                                                           Clinton D. Maple Hudson, M.D.  Diplomate, [employer] Board   CDY/MEDQ  D:  12/10/2003 14:42:58  T:   12/11/2003 14:45:37  Job:  [id number]   cc:   Schuyler Amor, M.D.  [address]  [place], [place] [address]  Fax: [phone]

## 2010-05-24 NOTE — H&P (Signed)
NAME:  Keith Murillo, HELZER                      ACCOUNT NO.:  [id number]   MEDICAL RECORD NO.:  [id number]                   PATIENT TYPE:  EMS   LOCATION:  MAJO                                 FACILITY:  [facility]   PHYSICIAN:  Colleen Can. Deborah Chalk, M.D.            DATE OF BIRTH:  1950/07/11   DATE OF ADMISSION:  09/23/2002  DATE OF DISCHARGE:                                HISTORY & PHYSICAL   CHIEF COMPLAINT:  Chest pain.   HISTORY OF PRESENT ILLNESS:  Keith Murillo is a very pleasant 60 year old  white male who basically has had no previous cardiac history.  He presents  to the emergency room department this evening with an episode of chest pain  that originally started yesterday.  He notes that while taking the garbage  out and sweeping off the sidewalk he had the onset of sharp chest discomfort  that was localized over the left breast.  It radiated into the left arm as  well as up into the neck.  It lasted for several hours.  It was worse with  walking and relieved with rest.  There was some question of shortness of  breath in association but he was nauseated and diaphoretic.  He noted that  he had felt tired over the past few weeks.  However, last night's episode  was certainly new.  He did take aspirin and drank some water and was able to  go on and go to sleep.  He slept well.  He went to work this morning, called  his primary care doctor, and he was seen in the office by Dr. Georgina Pillion.  EKG  there was normal.  He was subsequently referred here for further evaluation.  In the emergency room his first cardiac markers are positive.   PAST MEDICAL HISTORY:  1. Borderline hypertension.  2. Hypertriglyceridemia.  3. Previous extensive GU evaluation associated with internal bleeding     followed by Dr. Patsi Sears.   ALLERGIES:  None.   MEDICINES:  None.   FAMILY HISTORY:  Both of his parents are alive at age 51 with no cardiac  problems.  He has four younger brothers, one of which  had bypass surgery in  his early 73s.  His grandfather is deceased from heart attack as well.   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  There is no  smoking and very rare alcohol use.   REVIEW OF SYSTEMS:  He has had no problems with chewing, swallowing,  abdominal pain, constipation, or diarrhea.  He has had some intermittent  night sweats as well as insomnia.   PHYSICAL EXAMINATION:  GENERAL:  He is in no acute distress.  VITAL SIGNS:  Blood pressure 129/64, heart rate 70, respirations 20.  SKIN:  Warm and dry.  Color is unremarkable.  LUNGS:  Clear.  HEART:  Shows a regular rhythm.  ABDOMEN:  Soft, positive bowel sounds, nontender.  EXTREMITIES:  Without edema.  NEUROLOGIC:  Intact with no gross focal deficits.   PERTINENT LABORATORY DATA:  First troponin is 2.10, CK-MB is 14.  Other labs  are all pending.   EKG shows normal sinus rhythm.  There are no acute changes.   OVERALL IMPRESSION:  1. Chest pain.  2. Positive cardiac enzymes.  3. Probable metabolic syndrome.   PLAN:  He will be admitted to the service of Dr. Roger Shelter.  He will  continue on IV nitroglycerin and heparin.  We will add Lopressor, Lipitor,  and make plans for probable cardiac catheterization on Monday.      Juanell Fairly C. Earl Gala, N.P.                 Colleen Can. Deborah Chalk, M.D.    LCO/MEDQ  D:  09/23/2002  T:  09/24/2002  Job:  [id number]   cc:   Oley Balm. Georgina Pillion, M.D.  [address]  [place]  [place] [address]  Fax: [phone]

## 2010-05-24 NOTE — Discharge Summary (Signed)
NAME:  Keith Murillo, Keith Murillo                      ACCOUNT NO.:  [id number]   MEDICAL RECORD NO.:  [id number]                   PATIENT TYPE:  OIB   LOCATION:  6523                                 FACILITY:  [facility]   PHYSICIAN:  Colleen Can. Deborah Chalk, M.D.            DATE OF BIRTH:  13-Feb-1950   DATE OF ADMISSION:  10/26/2002  DATE OF DISCHARGE:  10/27/2002                                 DISCHARGE SUMMARY   PRIMARY DISCHARGE DIAGNOSIS:  Residual atherosclerotic cardiovascular  disease with subsequent percutaneous coronary intervention to the left  anterior descending with a 3.5 x 18 mm Cypher stent and subsequent  percutaneous coronary intervention of the right coronary with a 3.0 x 18 mm  Cypher stent placement.   SECONDARY DIAGNOSES:  1. Recent non Q wave myocardial infarction September 23, 2002.  At that time     he underwent successful angioplasty of the right coronary as well as     posterior descending.  2. Obesity.  3. Hypercholesterolemia.   HISTORY OF PRESENT ILLNESS:  Keith Murillo is a 60 year old male who has  known coronary disease.  He has had a recent non Q wave myocardial  infarction which was treated with angioplasty to the mid and right coronary  as well as the posterior descending.  He has significant residual disease.  He now presents for further percutaneous coronary intervention to the left  anterior descending.  Clinically he has done well without complaints.   Please see the dictated history and physical for the patient presentation  and profile.   LABORATORY DATA:  On admission chemistries were normal.  PT and PTT were  unremarkable.  CBC was within normal limits.   HOSPITAL COURSE:  The patient was admitted electively through short stay in  order to undergo further attempts at revascularization.  He had stent  placement to LAD as well as subsequent right coronary artery, that procedure  was tolerated well with no known problems.   He was subsequently  transferred to 6500 and today on October 27, 2002 he is  doing well without complaints.  Overall physical exam is unremarkable.  His  post-procedure labs are satisfactory, post CK-MB is negative, and he is a  stable candidate for discharge.   DISCHARGE CONDITION:  Stable.   DISCHARGE MEDICATIONS:  He will resume all of his previous medicines which  include:  1. Aspirin daily.  2. Plavix 75 mg daily for a total of six months.  3. Toprol XL 50 mg daily.  4. Lipitor 20 daily.  5. Nitroglycerin p.r.n.   DISCHARGE INSTRUCTIONS:  1. He is to place an ice pack if needed to the groin.  2. His activity is to be light for the remainder of this week then he may     resume his exercise program and cardiac rehabilitation.  3. He may return to work on Monday, October 31, 2002, half-days x1 week and     then  full time.  4. Will see him back in our office in approximately two to three weeks.  He     is asked to call to schedule that appointment.        Juanell Fairly C. Earl Gala, N.P.                 Colleen Can. Deborah Chalk, M.D.    LCO/MEDQ  D:  10/27/2002  T:  10/27/2002  Job:  [id number]   cc:   Oley Balm. Georgina Pillion, M.D.  [address]  [place]  [place] [address]  Fax: [phone]

## 2010-10-08 LAB — CBC
HCT: 40.1
HCT: 44.9
Hemoglobin: 14.3
Hemoglobin: 15.2
MCHC: 33.9
MCHC: 35.7
MCV: 87.7
MCV: 90.1
Platelets: 315
RBC: 4.58
WBC: 6.9
WBC: 8.3

## 2010-10-08 LAB — PROTIME-INR: Prothrombin Time: 13.3

## 2010-10-08 LAB — BASIC METABOLIC PANEL
BUN: 8
CO2: 26
Calcium: 8.8
GFR calc Af Amer: >60
GFR calc non Af Amer: >60
GFR calc non Af Amer: >60
Glucose, Bld: 85
Potassium: 3.8

## 2010-10-15 LAB — CBC
MCHC: 34.9
MCV: 86.9
Platelets: 328
RBC: 4.92
RDW: 13.3

## 2010-10-15 LAB — DIFFERENTIAL
Basophils Absolute: 0
Basophils Relative: 1
Eosinophils Absolute: 0.5
Monocytes Relative: 10
Neutrophils Relative %: 53

## 2010-10-15 LAB — BASIC METABOLIC PANEL
BUN: 11
CO2: 29
Calcium: 9.5
Chloride: 102
Creatinine, Ser: 0.88
Glucose, Bld: 84

## 2011-07-24 ENCOUNTER — Encounter: Payer: Self-pay | Admitting: Cardiology

## 2016-10-31 ENCOUNTER — Encounter: Payer: Self-pay | Admitting: Family Medicine

## 2016-10-31 ENCOUNTER — Ambulatory Visit (INDEPENDENT_AMBULATORY_CARE_PROVIDER_SITE_OTHER): Payer: Medicare Other | Admitting: Family Medicine

## 2016-10-31 VITALS — BP 142/94 | HR 71 | Temp 98.5°F | Ht 67.0 in | Wt 226.0 lb

## 2016-10-31 DIAGNOSIS — I2510 Atherosclerotic heart disease of native coronary artery without angina pectoris: Secondary | ICD-10-CM

## 2016-10-31 DIAGNOSIS — R7989 Other specified abnormal findings of blood chemistry: Secondary | ICD-10-CM | POA: Diagnosis not present

## 2016-10-31 DIAGNOSIS — Z7689 Persons encountering health services in other specified circumstances: Secondary | ICD-10-CM | POA: Diagnosis not present

## 2016-10-31 DIAGNOSIS — N529 Male erectile dysfunction, unspecified: Secondary | ICD-10-CM | POA: Diagnosis not present

## 2016-10-31 DIAGNOSIS — Z6835 Body mass index (BMI) 35.0-35.9, adult: Secondary | ICD-10-CM | POA: Diagnosis not present

## 2016-10-31 LAB — LIPID PANEL
CHOL/HDL RATIO: 4
Cholesterol: 160 mg/dL (ref 0–200)
HDL: 40.7 mg/dL (ref >39.00)
LDL Cholesterol: 82 mg/dL (ref 0–99)
NONHDL: 119.78
Triglycerides: 189 mg/dL — ABNORMAL HIGH (ref 0.0–149.0)
VLDL: 37.8 mg/dL (ref 0.0–40.0)

## 2016-10-31 LAB — COMPREHENSIVE METABOLIC PANEL
ALK PHOS: 75 U/L (ref 39–117)
ALT: 38 U/L (ref 0–53)
AST: 27 U/L (ref 0–37)
Albumin: 4.2 g/dL (ref 3.5–5.2)
BUN: 16 mg/dL (ref 6–23)
CO2: 27 mEq/L (ref 19–32)
Calcium: 9.5 mg/dL (ref 8.4–10.5)
Chloride: 103 mEq/L (ref 96–112)
Creatinine, Ser: 0.9 mg/dL (ref 0.40–1.50)
GFR: 89.95 mL/min (ref >60.00)
GLUCOSE: 127 mg/dL — AB (ref 70–99)
Potassium: 4.3 mEq/L (ref 3.5–5.1)
SODIUM: 137 meq/L (ref 135–145)
Total Bilirubin: 0.4 mg/dL (ref 0.2–1.2)
Total Protein: 6.9 g/dL (ref 6.0–8.3)

## 2016-10-31 LAB — HEMOGLOBIN A1C: Hgb A1c MFr Bld: 5.4 % (ref 4.6–6.5)

## 2016-10-31 LAB — CBC
HEMATOCRIT: 45.8 % (ref 39.0–52.0)
Hemoglobin: 15.8 g/dL (ref 13.0–17.0)
MCHC: 34.5 g/dL (ref 30.0–36.0)
MCV: 88.1 fl (ref 78.0–100.0)
Platelets: 316 10*3/uL (ref 150.0–400.0)
RBC: 5.2 Mil/uL (ref 4.22–5.81)
RDW: 13.4 % (ref 11.5–15.5)
WBC: 7.6 10*3/uL (ref 4.0–10.5)

## 2016-10-31 LAB — TSH: TSH: 2.14 u[IU]/mL (ref 0.35–4.50)

## 2016-10-31 LAB — TESTOSTERONE: Testosterone: 253.05 ng/dL — ABNORMAL LOW (ref 300.00–890.00)

## 2016-10-31 NOTE — Progress Notes (Signed)
Subjective:    Patient ID: Keith Murillo, male    DOB: 1950/03/06, 66 y.o.   MRN: [id number]  HPI This is a 66 yo male who presents today to establish care. He has not had any regular care for about 3 years. He is married, from this area. Has 3 daughters, 6 grandchildren. 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   Has history of non Q wave MI and had multiple stents placed. Has not had any cardiology follow up in several years. Take a regular ASA, came off Plavix- made him feel unwell.   Was diagnosed with OSA and used CPAP for awhile. Has lost some weight and stopped using his CPAP machine.   .Doing a Keto type diet for about 1 month. Trying to lose weight. Was previously seen at [facility].   Has been on testosterone supplements in the past. Has tried Viagra in the past. Didn't like the way it made him feel.    Past Medical History:  Diagnosis Date  . Chest pain   . Chronic anticoagulation   . Hypercholesterolemia   . Hyperlipidemia   . Ischemic heart disease September 2004   Known--with non-Q-wave myocardial infarction   . Obesity   . Sleep apnea    Past Surgical History:  Procedure Laterality Date  . CARDIAC CATHETERIZATION  11/2007   Family History  Problem Relation Age of Onset  . Cancer Mother   . Hearing loss Father   . Cancer Maternal Grandmother   . Parkinson's disease Maternal Grandfather   . Stroke Maternal Grandfather   . Stroke Paternal Grandmother   . Heart attack Paternal Grandfather    Social History  Substance Use Topics  . Smoking status: Never Smoker  . Smokeless tobacco: Never Used  . Alcohol use Yes     Comment: occas       Review of Systems  Constitutional: Negative for fatigue and fever.  HENT: Positive for rhinorrhea. Postnasal drip: this week.   Eyes: Negative.   Respiratory: Negative.   Cardiovascular: Positive for leg swelling (sock marks, goes down at night.). Negative  for chest pain and palpitations.  Gastrointestinal: Positive for constipation (occasional).  Genitourinary: Negative.   Musculoskeletal: Positive for arthralgias (popping in shoulders).  Allergic/Immunologic: Negative for environmental allergies.  Neurological: Positive for headaches (occasional).  Psychiatric/Behavioral: Positive for sleep disturbance (occasionally takes melatonin).       Objective:   Physical Exam Physical Exam  Constitutional: Oriented to person, place, and time. He appears well-developed and well-nourished.  HENT:  Head: Normocephalic and atraumatic.  Eyes: Conjunctivae are normal.  Neck: Normal range of motion. Neck supple.  Cardiovascular: Normal rate, regular rhythm and normal heart sounds.   Pulmonary/Chest: Effort normal and breath sounds normal.  Musculoskeletal: Normal range of motion. No pedal edema.  Neurological: Alert and oriented to person, place, and time.  Skin: Skin is warm and dry.  Psychiatric: Normal mood and affect. Behavior is normal. Judgment and thought content normal.  Vitals reviewed.    BP (!) 142/94 (BP Location: Left Arm, Patient Position: Sitting, Cuff Size: Normal)   Pulse 71   Temp 98.5 F (36.9 C) (Oral)   Ht 5\' 7"  (1.702 m)   Wt 226 lb (102.5 kg)   SpO2 97%   BMI 35.40 kg/m    Depression screen [facility] 2/9 10/31/2016  Decreased Interest 0  Down, Depressed, Hopeless 0  PHQ - 2 Score 0   . Assessment & Plan:  1. Encounter to establish care - Discussed and encouraged healthy lifestyle choices- adequate sleep, regular exercise, stress management and healthy food choices.   2. Erectile dysfunction, unspecified erectile dysfunction type - will check labs, may need referral to Urology - CBC - Comprehensive metabolic panel - Hemoglobin A1c - TSH - Testosterone  3. Coronary artery disease involving native heart without angina pectoris, unspecified vessel or lesion type - Comprehensive metabolic panel - Hemoglobin A1c -  Lipid panel - TSH  4. BMI 35.0-35.9,adult - Hemoglobin A1c - Lipid panel - TSH  - 3 month follow up, welcome to Medicare visit  Clarene Reamer, FNP-BC  [facility] at [facility], [facility]  11/04/2016 8:06 AM

## 2016-10-31 NOTE — Patient Instructions (Signed)
It was a pleasure to meet you today! I look forward to partnering with you for your health care needs  Please schedule your Welcome to Medicare Visit

## 2016-11-05 ENCOUNTER — Encounter: Payer: Self-pay | Admitting: Family Medicine

## 2016-11-06 ENCOUNTER — Encounter: Payer: Self-pay | Admitting: Family Medicine

## 2016-11-06 NOTE — Addendum Note (Signed)
Addended by: Clarene Reamer B on: 11/06/2016 10:58 AM   Modules accepted: Orders

## 2016-11-07 ENCOUNTER — Other Ambulatory Visit (INDEPENDENT_AMBULATORY_CARE_PROVIDER_SITE_OTHER): Payer: Medicare Other

## 2016-11-07 ENCOUNTER — Other Ambulatory Visit: Payer: Self-pay | Admitting: Family Medicine

## 2016-11-07 DIAGNOSIS — R7989 Other specified abnormal findings of blood chemistry: Secondary | ICD-10-CM

## 2016-11-07 DIAGNOSIS — Z125 Encounter for screening for malignant neoplasm of prostate: Secondary | ICD-10-CM

## 2016-11-07 LAB — TESTOSTERONE: TESTOSTERONE: 215.09 ng/dL — AB (ref 300.00–890.00)

## 2016-11-07 LAB — FOLLICLE STIMULATING HORMONE: FSH: 17.6 m[IU]/mL (ref 1.4–18.1)

## 2016-11-07 LAB — LUTEINIZING HORMONE: LH: 4.71 m[IU]/mL (ref 1.50–9.30)

## 2016-11-07 NOTE — Addendum Note (Signed)
Addended by: Ellamae Sia on: 11/07/2016 03:06 PM   Modules accepted: Orders

## 2016-11-14 ENCOUNTER — Other Ambulatory Visit: Payer: Self-pay | Admitting: Family Medicine

## 2016-11-14 ENCOUNTER — Encounter: Payer: Self-pay | Admitting: Family Medicine

## 2016-11-14 DIAGNOSIS — E291 Testicular hypofunction: Secondary | ICD-10-CM

## 2016-11-14 NOTE — Addendum Note (Signed)
Addended by: Ellamae Sia on: 11/14/2016 08:51 AM   Modules accepted: Orders

## 2016-11-21 ENCOUNTER — Other Ambulatory Visit (INDEPENDENT_AMBULATORY_CARE_PROVIDER_SITE_OTHER): Payer: Medicare Other

## 2016-11-21 DIAGNOSIS — Z125 Encounter for screening for malignant neoplasm of prostate: Secondary | ICD-10-CM | POA: Diagnosis not present

## 2016-11-21 LAB — PSA, MEDICARE: PSA: 3.04 ng/mL (ref 0.10–4.00)

## 2016-12-03 ENCOUNTER — Ambulatory Visit: Payer: Self-pay | Admitting: Urology

## 2016-12-05 ENCOUNTER — Encounter: Payer: Self-pay | Admitting: Urology

## 2016-12-05 ENCOUNTER — Ambulatory Visit (INDEPENDENT_AMBULATORY_CARE_PROVIDER_SITE_OTHER): Payer: Medicare Other | Admitting: Urology

## 2016-12-05 VITALS — BP 150/88 | HR 84 | Ht 67.0 in | Wt 227.2 lb

## 2016-12-05 DIAGNOSIS — E291 Testicular hypofunction: Secondary | ICD-10-CM | POA: Diagnosis not present

## 2016-12-05 MED ORDER — CLOMIPHENE CITRATE 50 MG PO TABS: ORAL_TABLET | ORAL | 1 refills | Status: DC

## 2016-12-05 NOTE — Progress Notes (Signed)
12/05/2016 10:22 AM   Keith Murillo 05/12/1950 [id number]  Referring provider: [name], [place], [place] [address]  Chief Complaint  Patient presents with  . Hypogonadism    HPI: Keith Murillo is a 66 y.o. male seen in consultation at the request of Clarene Reamer for evaluation of hypogonadism.  He was previously on testosterone replacement by a provider in [place] and stopped approximately 1 year ago due to headaches.  He was on replacement for 2-3 years.  He has an absent left testis and underwent laparoscopy and exploration by Dr. Gaynelle Arabian in the mid 1990s.  He currently complains of difficulty achieving and maintaining erection, tiredness, fatigue and muscle wasting/weakness.  He has mild lower urinary tract symptoms of urgency when changing from sitting to standing position and nocturia x1-2.  He denies dysuria or gross hematuria.  Denies flank, abdominal, pelvic or scrotal pain.  Evaluation included testosterone levels x2 which were low at 215 and 250 ng/dL.  His LH was low normal at 4.71.  PSA was normal at 3.04   PMH: Past Medical History:  Diagnosis Date  . Chest pain   . Chronic anticoagulation   . Heart attack ([place])   . Hypercholesterolemia   . Hyperlipidemia   . Ischemic heart disease September 2004   Known--with non-Q-wave myocardial infarction   . Obesity   . Sleep apnea     Surgical History: Past Surgical History:  Procedure Laterality Date  . CARDIAC CATHETERIZATION  11/2007    Home Medications:  Allergies as of 12/05/2016   No Known Allergies     Medication List        Accurate as of 12/05/16 10:22 AM. Always use your most recent med list.          aspirin 325 MG tablet Take 325 mg by mouth daily.   AZO-CRANBERRY PO Take by mouth.   B COMPLEX PO Take by mouth daily.   Biotin 5000 MCG Caps Take by mouth.   DHEA 25 MG Caps Take by mouth.   folic acid 983 MCG tablet Commonly known as:   FOLVITE Take 400 mcg by mouth daily.   GLUCOSAMINE HCL PO Take by mouth.   GRAPE SEED PO Take by mouth daily.   L-Arginine 1000 MG Tabs Take by mouth.   magnesium oxide 400 MG tablet Commonly known as:  MAG-OX Take 400 mg by mouth daily.   vitamin C 500 MG tablet Commonly known as:  ASCORBIC ACID Take 500 mg by mouth daily.   VITAMIN D-3 PO Take by mouth as directed.   VITAMIN K2 PO Take by mouth.       Allergies: No Known Allergies  Family History: Family History  Problem Relation Age of Onset  . Cancer Mother   . Hearing loss Father   . Cancer Maternal Grandmother   . Parkinson's disease Maternal Grandfather   . Stroke Maternal Grandfather   . Stroke Paternal Grandmother   . Heart attack Paternal Grandfather     Social History:  reports that  has never smoked. he has never used smokeless tobacco. He reports that he drinks alcohol. He reports that he does not use drugs.  ROS: UROLOGY Frequent Urination?: Yes Hard to postpone urination?: Yes Burning/pain with urination?: No Get up at night to urinate?: Yes Leakage of urine?: No Urine stream starts and stops?: No Trouble starting stream?: No Do you have to strain to urinate?: No Blood in urine?: No Urinary tract  infection?: No Sexually transmitted disease?: No Injury to kidneys or bladder?: No Painful intercourse?: No Weak stream?: Yes Erection problems?: Yes Penile pain?: No  Gastrointestinal Nausea?: No Vomiting?: No Indigestion/heartburn?: No Diarrhea?: No Constipation?: Yes  Constitutional Fever: No Night sweats?: No Weight loss?: No Fatigue?: Yes  Skin Skin rash/lesions?: No Itching?: No  Eyes Blurred vision?: No Double vision?: No  Ears/Nose/Throat Sore throat?: No Sinus problems?: No  Hematologic/Lymphatic Swollen glands?: No Easy bruising?: No  Cardiovascular Leg swelling?: Yes Chest pain?: No  Respiratory Cough?: No Shortness of breath?:  No  Endocrine Excessive thirst?: No  Musculoskeletal Back pain?: No Joint pain?: Yes  Neurological Headaches?: No Dizziness?: No  Psychologic Depression?: No Anxiety?: No  Physical Exam: BP (!) 150/88 (BP Location: Right Arm, Patient Position: Sitting, Cuff Size: Large)   Pulse 84   Ht 5\' 7"  (1.702 m)   Wt 227 lb 3.2 oz (103.1 kg)   BMI 35.58 kg/m   Constitutional:  Alert and oriented, No acute distress. HEENT: [place] AT, moist mucus membranes.  Trachea midline, no masses. Cardiovascular: No clubbing, cyanosis, or edema. Respiratory: Normal respiratory effort, no increased work of breathing. GI: Abdomen is soft, nontender, nondistended, no abdominal masses GU: No CVA tenderness.  Penis circumcised without lesions, left testis absent.  Right testis without masses or tenderness.  Prostate 40 g, smooth without nodules. Skin: No rashes, bruises or suspicious lesions. Lymph: No cervical or inguinal adenopathy. Neurologic: Grossly intact, no focal deficits, moving all 4 extremities. Psychiatric: Normal mood and affect.  Laboratory Data: Lab Results  Component Value Date   WBC 7.6 10/31/2016   HGB 15.8 10/31/2016   HCT 45.8 10/31/2016   MCV 88.1 10/31/2016   PLT 316.0 10/31/2016    Lab Results  Component Value Date   CREATININE 0.90 10/31/2016    No results found for: PSA1  Lab Results  Component Value Date   TESTOSTERONE 215.09 (L) 11/07/2016    Lab Results  Component Value Date   HGBA1C 5.4 10/31/2016     Assessment & Plan:    1. Hypogonadism in male Potential side effects of testosterone replacement were discussed including stimulation of benign prostatic growth with lower urinary tract symptoms; erythrocytosis; edema; gynecomastia; worsening sleep apnea; venous thromboembolism; testicular atrophy and infertility. Recent studies suggesting an increased incidence of heart attack and stroke in patients taking testosterone was discussed. He was informed there is  conflicting evidence regarding the impact of testosterone therapy on cardiovascular risk. The theoretical risk of growth stimulation of an undetected prostate cancer was also discussed.  He was informed that current evidence does not provide any definitive answers regarding the risks of testosterone therapy on prostate cancer and cardiovascular disease. The need for periodic monitoring of his testosterone level, PSA, hematocrit and DRE was discussed.  His LH was low normal and we did discuss the alternative of off label use of clomiphene citrate.  He would like to try this first and Rx was sent to his pharmacy.  Follow-up 6 weeks for symptom reassessment and testosterone level.     Abbie Sons, [place] [address], [place], [place] [address] [phone]

## 2017-01-01 ENCOUNTER — Encounter: Payer: Self-pay | Admitting: Family Medicine

## 2017-01-14 ENCOUNTER — Encounter: Payer: Self-pay | Admitting: Urology

## 2017-01-14 ENCOUNTER — Ambulatory Visit (INDEPENDENT_AMBULATORY_CARE_PROVIDER_SITE_OTHER): Payer: Medicare Other | Admitting: Urology

## 2017-01-14 DIAGNOSIS — E291 Testicular hypofunction: Secondary | ICD-10-CM | POA: Diagnosis not present

## 2017-01-14 MED ORDER — SILDENAFIL CITRATE 20 MG PO TABS: ORAL_TABLET | ORAL | 0 refills | Status: DC

## 2017-01-14 MED ORDER — TESTOSTERONE CYPIONATE 200 MG/ML IM SOLN: 100.0000 mg | INTRAMUSCULAR | 0 refills | Status: DC

## 2017-01-14 NOTE — Progress Notes (Signed)
01/14/2017 8:48 AM   Keith Murillo Jun 25, 1950 [id number]  Referring provider: [name], [place], [place] [address]  Chief Complaint  Patient presents with  . Hypogonadism    HPI: 40 67-year-old male presents for follow-up of hypogonadism.  He was seen on 12/05/2016.  He did have a low LH and elected to start Clomid.  He has been on this medication approximately 6 weeks and has not seen any improvement in his libido or energy.  He also complains of erectile dysfunction.  The   PMH: Past Medical History:  Diagnosis Date  . Chest pain   . Chronic anticoagulation   . Heart attack ([place])   . Hypercholesterolemia   . Hyperlipidemia   . Ischemic heart disease September 2004   Known--with non-Q-wave myocardial infarction   . Obesity   . Sleep apnea     Surgical History: Past Surgical History:  Procedure Laterality Date  . CARDIAC CATHETERIZATION  11/2007    Home Medications:  Allergies as of 01/14/2017   No Known Allergies     Medication List        Accurate as of 01/14/17  8:48 AM. Always use your most recent med list.          aspirin 325 MG tablet Take 325 mg by mouth daily.   AZO-CRANBERRY PO Take by mouth.   B COMPLEX PO Take by mouth daily.   Biotin 5000 MCG Caps Take by mouth.   clomiPHENE 50 MG tablet Commonly known as:  CLOMID 1/2 tab daily or 1 tab qod   DHEA 25 MG Caps Take by mouth.   folic acid 259 MCG tablet Commonly known as:  FOLVITE Take 400 mcg by mouth daily.   GLUCOSAMINE HCL PO Take by mouth.   GRAPE SEED PO Take by mouth daily.   L-Arginine 1000 MG Tabs Take by mouth.   magnesium oxide 400 MG tablet Commonly known as:  MAG-OX Take 400 mg by mouth daily.   vitamin C 500 MG tablet Commonly known as:  ASCORBIC ACID Take 500 mg by mouth daily.   VITAMIN D-3 PO Take by mouth as directed.   VITAMIN K2 PO Take by mouth.       Allergies: No Known Allergies  Family  History: Family History  Problem Relation Age of Onset  . Cancer Mother   . Hearing loss Father   . Cancer Maternal Grandmother   . Parkinson's disease Maternal Grandfather   . Stroke Maternal Grandfather   . Stroke Paternal Grandmother   . Heart attack Paternal Grandfather     Social History:  reports that  has never smoked. he has never used smokeless tobacco. He reports that he drinks alcohol. He reports that he does not use drugs.  ROS: UROLOGY Frequent Urination?: No Hard to postpone urination?: No Burning/pain with urination?: No Get up at night to urinate?: Yes Leakage of urine?: No Urine stream starts and stops?: No Trouble starting stream?: No Do you have to strain to urinate?: No Blood in urine?: No Urinary tract infection?: No Sexually transmitted disease?: No Injury to kidneys or bladder?: No Painful intercourse?: No Weak stream?: No Erection problems?: Yes Penile pain?: No  Gastrointestinal Nausea?: No Vomiting?: No Indigestion/heartburn?: No Diarrhea?: No Constipation?: No  Constitutional Fever: No Night sweats?: Yes Weight loss?: No Fatigue?: No  Skin Skin rash/lesions?: No Itching?: No  Eyes Blurred vision?: Yes Double vision?: No  Ears/Nose/Throat Sore throat?: No Sinus problems?: No  Hematologic/Lymphatic Swollen glands?: No Easy bruising?: No  Cardiovascular Leg swelling?: No Chest pain?: No  Respiratory Cough?: No Shortness of breath?: No  Endocrine Excessive thirst?: No  Musculoskeletal Back pain?: Yes Joint pain?: Yes  Neurological Headaches?: No Dizziness?: No  Psychologic Depression?: No Anxiety?: No  Physical Exam: BP (!) 159/80 (BP Location: Right Arm, Patient Position: Sitting, Cuff Size: Normal)   Pulse 80   Ht 5\' 7"  (1.702 m)   Wt 230 lb 8 oz (104.6 kg)   BMI 36.10 kg/m   Constitutional:  Alert and oriented, No acute distress. HEENT: [place] AT, moist mucus membranes.  Trachea midline, no  masses. Cardiovascular: No clubbing, cyanosis, or edema. Respiratory: Normal respiratory effort, no increased work of breathing. GI: Abdomen is soft, nontender, nondistended, no abdominal masses GU: No CVA tenderness.  Skin: No rashes, bruises or suspicious lesions. Lymph: No cervical or inguinal adenopathy. Neurologic: Grossly intact, no focal deficits, moving all 4 extremities. Psychiatric: Normal mood and affect.  Laboratory Data: Lab Results  Component Value Date   WBC 7.6 10/31/2016   HGB 15.8 10/31/2016   HCT 45.8 10/31/2016   MCV 88.1 10/31/2016   PLT 316.0 10/31/2016    Lab Results  Component Value Date   CREATININE 0.90 10/31/2016    Lab Results  Component Value Date   TESTOSTERONE 215.09 (L) 11/07/2016    Lab Results  Component Value Date   HGBA1C 5.4 10/31/2016     Assessment & Plan:   Testosterone level was drawn today.  He would like to restart testosterone injections and Rx will be sent to his pharmacy.  He was instructed to discontinue the Clomid.  Follow-up testosterone level in 6 weeks.  He also requested Rx for generic sildenafil for erectile dysfunction.    1. Hypogonadism in male  - Testosterone    Abbie Sons, MD  [facility] [address] [place],  [address] [phone]

## 2017-01-15 LAB — TESTOSTERONE: Testosterone: 394 ng/dL (ref 264–916)

## 2017-01-16 ENCOUNTER — Telehealth: Payer: Self-pay

## 2017-01-16 NOTE — Telephone Encounter (Signed)
Patient notified

## 2017-01-16 NOTE — Telephone Encounter (Signed)
-----   Message from Abbie Sons, MD sent at 01/15/2017  8:55 AM EST ----- T level better at 394.  We discussed yesterday stopping the clomid and starting testosterone

## 2017-01-19 ENCOUNTER — Other Ambulatory Visit: Payer: Self-pay | Admitting: Family Medicine

## 2017-01-27 ENCOUNTER — Other Ambulatory Visit: Payer: Medicare Other

## 2017-01-30 ENCOUNTER — Ambulatory Visit (INDEPENDENT_AMBULATORY_CARE_PROVIDER_SITE_OTHER): Payer: Medicare Other | Admitting: Family Medicine

## 2017-01-30 ENCOUNTER — Encounter: Payer: Self-pay | Admitting: Family Medicine

## 2017-01-30 ENCOUNTER — Telehealth: Payer: Self-pay | Admitting: Urology

## 2017-01-30 VITALS — BP 144/82 | HR 82 | Temp 98.3°F | Ht 67.25 in | Wt 230.2 lb

## 2017-01-30 DIAGNOSIS — I2510 Atherosclerotic heart disease of native coronary artery without angina pectoris: Secondary | ICD-10-CM | POA: Diagnosis not present

## 2017-01-30 DIAGNOSIS — H9313 Tinnitus, bilateral: Secondary | ICD-10-CM | POA: Diagnosis not present

## 2017-01-30 DIAGNOSIS — Z6835 Body mass index (BMI) 35.0-35.9, adult: Secondary | ICD-10-CM | POA: Diagnosis not present

## 2017-01-30 DIAGNOSIS — Z0000 Encounter for general adult medical examination without abnormal findings: Secondary | ICD-10-CM

## 2017-01-30 NOTE — Progress Notes (Signed)
Subjective:    Keith Murillo is a 67 y.o. male who presents for Medicare Initial preventive examination.   Preventive Screening-Counseling & Management  Tobacco Social History   Tobacco Use  Smoking Status Never Smoker  Smokeless Tobacco Never Used    Problems Prior to Visit 1.  Patient Active Problem List   Diagnosis Date Noted  . Hypogonadism in male 12/05/2016     Current Problems (verified) Patient Active Problem List   Diagnosis Date Noted  . Hypogonadism in male 12/05/2016    Medications Prior to Visit Current Outpatient Medications on File Prior to Visit  Medication Sig Dispense Refill  . aspirin 325 MG tablet Take 325 mg by mouth daily.    . AZO-CRANBERRY PO Take by mouth.    . B Complex Vitamins (B COMPLEX PO) Take by mouth daily.    Marland Kitchen Bioflavonoid Products (GRAPE SEED PO) Take by mouth daily.    . Biotin 5000 MCG CAPS Take by mouth.    . Cholecalciferol (VITAMIN D-3 PO) Take by mouth as directed.    . clomiPHENE (CLOMID) 50 MG tablet 1/2 tab daily or 1 tab qod 15 tablet 1  . DHEA 25 MG CAPS Take by mouth.    . folic acid (FOLVITE) 409 MCG tablet Take 400 mcg by mouth daily.    Marland Kitchen GLUCOSAMINE HCL PO Take by mouth.    Marland Kitchen L-Arginine 1000 MG TABS Take by mouth.    . magnesium oxide (MAG-OX) 400 MG tablet Take 400 mg by mouth daily.    . Menaquinone-7 (VITAMIN K2 PO) Take by mouth.    . sildenafil (REVATIO) 20 MG tablet 2-5 tabs 1 hour prior to intercourse 10 tablet 0  . testosterone cypionate (DEPOTESTOSTERONE CYPIONATE) 200 MG/ML injection Inject 0.5 mLs (100 mg total) into the muscle once a week. 10 mL 0  . vitamin C (ASCORBIC ACID) 500 MG tablet Take 500 mg by mouth daily.     No current facility-administered medications on file prior to visit.     Current Medications (verified) Current Outpatient Medications  Medication Sig Dispense Refill  . aspirin 325 MG tablet Take 325 mg by mouth daily.    . AZO-CRANBERRY PO Take by mouth.    . B Complex  Vitamins (B COMPLEX PO) Take by mouth daily.    Marland Kitchen Bioflavonoid Products (GRAPE SEED PO) Take by mouth daily.    . Biotin 5000 MCG CAPS Take by mouth.    . Cholecalciferol (VITAMIN D-3 PO) Take by mouth as directed.    . clomiPHENE (CLOMID) 50 MG tablet 1/2 tab daily or 1 tab qod 15 tablet 1  . DHEA 25 MG CAPS Take by mouth.    . folic acid (FOLVITE) 811 MCG tablet Take 400 mcg by mouth daily.    Marland Kitchen GLUCOSAMINE HCL PO Take by mouth.    Marland Kitchen L-Arginine 1000 MG TABS Take by mouth.    . magnesium oxide (MAG-OX) 400 MG tablet Take 400 mg by mouth daily.    . Menaquinone-7 (VITAMIN K2 PO) Take by mouth.    . sildenafil (REVATIO) 20 MG tablet 2-5 tabs 1 hour prior to intercourse 10 tablet 0  . testosterone cypionate (DEPOTESTOSTERONE CYPIONATE) 200 MG/ML injection Inject 0.5 mLs (100 mg total) into the muscle once a week. 10 mL 0  . vitamin C (ASCORBIC ACID) 500 MG tablet Take 500 mg by mouth daily.     No current facility-administered medications for this visit.      Allergies (verified)  Patient has no known allergies.   PAST HISTORY  Family History Family History  Problem Relation Age of Onset  . Cancer Mother   . Hearing loss Father   . Cancer Maternal Grandmother   . Parkinson's disease Maternal Grandfather   . Stroke Maternal Grandfather   . Stroke Paternal Grandmother   . Heart attack Paternal Grandfather     Social History Social History   Tobacco Use  . Smoking status: Never Smoker  . Smokeless tobacco: Never Used  Substance Use Topics  . Alcohol use: Yes    Comment: occas    Are there smokers in your home (other than you)?  No  Risk Factors Current exercise habits: The patient does not participate in regular exercise at present.  Dietary issues discussed: Decreased portions, increased fiber/vegetables.    Cardiac risk factors: advanced age (older than 53 for men, 48 for women), dyslipidemia, hypertension, male gender and obesity (BMI >= 30 kg/m2).  Depression  Screen (Note: if answer to either of the following is "Yes", a more complete depression screening is indicated)   Q1: Over the past two weeks, have you felt down, depressed or hopeless? No  Q2: Over the past two weeks, have you felt little interest or pleasure in doing things? No  Have you lost interest or pleasure in daily life? No  Do you often feel hopeless? No  Do you cry easily over simple problems? No  Activities of Daily Living In your present state of health, do you have any difficulty performing the following activities?:  Driving? No Managing money?  No Feeding yourself? No Getting from bed to chair? No Climbing a flight of stairs? No Preparing food and eating?: No Bathing or showering? No Getting dressed: No Getting to the toilet? No Using the toilet:No Moving around from place to place: No In the past year have you fallen or had a near fall?:No   Are you sexually active?  Yes  Do you have more than one partner?  No  Hearing Difficulties: Yes Do you often ask people to speak up or repeat themselves? No Do you experience ringing or noises in your ears? Yes Do you have difficulty understanding soft or whispered voices? Yes   Do you feel that you have a problem with memory? No  Do you often misplace items? No  Do you feel safe at home?  Yes  Cognitive Testing  Alert? Yes  Normal Appearance?Yes  Oriented to person? Yes  Place? Yes   Time? Yes  Recall of three objects?  Yes  Can perform simple calculations? Yes  Displays appropriate judgment?Yes  Can read the correct time from a watch face?Yes   Advanced Directives have been discussed with the patient? Yes   List the Names of Other Physician/Practitioners you currently use: 1.  [name], urology  Indicate any recent Medical Services you may have received from other than [facility] providers in the past year (date may be approximate).   There is no immunization history on file for this patient.  Screening  Tests Health Maintenance  Topic Date Due  . Hepatitis C Screening  Sep 08, 1950  . HIV Screening  08/18/1966  . COLONOSCOPY  10/18/2016  . INFLUENZA VACCINE  10/31/2017 (Originally 08/06/2016)  . TETANUS/TDAP  10/31/2017 (Originally 08/18/1970)  . PNA vac Low Risk Adult (1 of 2 - PCV13) 10/31/2017 (Originally 08/17/2016)    All answers were reviewed with the patient and necessary referrals were made:  [name], FNP  01/30/2017   History reviewed: allergies, current medications, past family history, past medical history, past social history, past surgical history and problem list  Review of Systems Ears, nose, mouth, throat, and face: positive for hearing loss, nasal congestion and tinnitus    Objective:     Vision by Snellen chart: right eye:20/20, left eye:20/20 Blood pressure (!) 144/82, pulse 82, temperature 98.3 F (36.8 C), temperature source Oral, height 5' 7.25" (1.708 m), weight 230 lb 4 oz (104.4 kg), SpO2 96 %. Body mass index is 35.79 kg/m.  General appearance: alert, cooperative, appears stated age and no distress Lungs: clear to auscultation bilaterally Chest wall: no tenderness Heart: regular rate and rhythm, S1, S2 normal, no murmur, click, rub or gallop Abdomen: soft, non-tender; bowel sounds normal; no masses,  no organomegaly Extremities: extremities normal, atraumatic, no cyanosis or edema Skin: Skin color, texture, turgor normal. No rashes or lesions    BP (!) 144/82   Pulse 82   Temp 98.3 F (36.8 C) (Oral)   Ht 5' 7.25" (1.708 m)   Wt 230 lb 4 oz (104.4 kg)   SpO2 96%   BMI 35.79 kg/m   Wt Readings from Last 3 Encounters:  01/30/17 230 lb 4 oz (104.4 kg)  01/14/17 230 lb 8 oz (104.6 kg)  12/05/16 227 lb 3.2 oz (103.1 kg)  EKG- NSR rate 79  Assessment:     1. Welcome to Medicare preventive visit - EKG 12-Lead  2. Tinnitus of both ears - ongoing problem, he is not interested in ENT referral at this time  3. Class 2 severe obesity  due to excess calories with serious comorbidity and body mass index (BMI) of 35.0 to 35.9 in adult [facility]) - encouraged him to eat out less, discussed increasing fiber/vegetables, avoid sweetened beberages  4. Coronary artery disease involving native heart without angina pectoris, unspecified vessel or lesion type - continue current meds   Clarene Reamer, FNP-BC  [facility]  01/30/2017 1:52 PM      Plan:     During the course of the visit the patient was educated and counseled about appropriate screening and preventive services including:    Pneumococcal vaccine   Influenza vaccine  Td vaccine  Screening electrocardiogram  Nutrition counseling   Advanced directives: has NO advanced directive - not interested in additional information  Diet review for nutrition referral? Yes ____  Not Indicated _x___   Patient Instructions (the written plan) was given to the patient.  Medicare Attestation I have personally reviewed: The patient's medical and social history Their use of alcohol, tobacco or illicit drugs Their current medications and supplements The patient's functional ability including ADLs,fall risks, home safety risks, cognitive, and hearing and visual impairment Diet and physical activities Evidence for depression or mood disorders  The patient's weight, height, BMI, and visual acuity have been recorded in the chart.  I have made referrals, counseling, and provided education to the patient based on review of the above and I have provided the patient with a written personalized care plan for preventive services.     [name], FNP   01/30/2017      Patient ID: Brent General, male   DOB: 1950/05/09, 67 y.o.   MRN: [id number]

## 2017-01-30 NOTE — Telephone Encounter (Signed)
Pt said Dr Bernardo Heater told him of a local pharmacy where he could get generic sildenafil 20 mg.  Please give pt a call [phone]

## 2017-01-30 NOTE — Patient Instructions (Addendum)
Try saline nasal spray several times a day to moisturize  You will be notified of appointment for sleep study  Please follow up in 6 months   Keith Murillo , Thank you for taking time to come for your Medicare Wellness Visit. I appreciate your ongoing commitment to your health goals. Please review the following plan we discussed and let me know if I can assist you in the future.   These are the goals we discussed:  Weight loss of 1/2-1 pound per week, increase vegetables, decrease sweet tea   This is a list of the screening recommended for you and due dates:  Health Maintenance  Topic Date Due  .  Hepatitis C: One time screening is recommended by Center for Disease Control  (CDC) for  adults born from [birth year] through 1964.   1950-06-23  . HIV Screening  08/18/1966  . Colon Cancer Screening  10/18/2016  . Flu Shot  10/31/2017*  . Tetanus Vaccine  10/31/2017*  . Pneumonia vaccines (1 of 2 - PCV13) 10/31/2017*  *Topic was postponed. The date shown is not the original due date.

## 2017-02-04 NOTE — Telephone Encounter (Signed)
Attempted to call pt without success.

## 2017-02-05 ENCOUNTER — Encounter: Payer: Self-pay | Admitting: Family Medicine

## 2017-02-05 ENCOUNTER — Encounter (INDEPENDENT_AMBULATORY_CARE_PROVIDER_SITE_OTHER): Payer: Self-pay

## 2017-02-06 ENCOUNTER — Other Ambulatory Visit: Payer: Self-pay

## 2017-02-06 MED ORDER — SILDENAFIL CITRATE 20 MG PO TABS: ORAL_TABLET | ORAL | 0 refills | Status: DC

## 2017-02-16 ENCOUNTER — Other Ambulatory Visit: Payer: Self-pay

## 2017-02-16 MED ORDER — SILDENAFIL CITRATE 20 MG PO TABS: ORAL_TABLET | ORAL | 0 refills | Status: DC

## 2017-02-18 ENCOUNTER — Other Ambulatory Visit: Payer: Self-pay | Admitting: Urology

## 2017-02-18 MED ORDER — SILDENAFIL CITRATE 20 MG PO TABS: ORAL_TABLET | ORAL | 1 refills | Status: DC

## 2017-02-24 ENCOUNTER — Other Ambulatory Visit: Payer: Medicare Other

## 2017-02-24 DIAGNOSIS — E291 Testicular hypofunction: Secondary | ICD-10-CM | POA: Diagnosis not present

## 2017-02-25 LAB — TESTOSTERONE: TESTOSTERONE: 825 ng/dL (ref 264–916)

## 2017-03-02 ENCOUNTER — Ambulatory Visit (INDEPENDENT_AMBULATORY_CARE_PROVIDER_SITE_OTHER): Payer: Medicare Other | Admitting: Family Medicine

## 2017-03-02 ENCOUNTER — Telehealth: Payer: Self-pay | Admitting: Family Medicine

## 2017-03-02 ENCOUNTER — Encounter: Payer: Self-pay | Admitting: Family Medicine

## 2017-03-02 VITALS — BP 180/84 | HR 103 | Temp 98.5°F | Wt 240.0 lb

## 2017-03-02 DIAGNOSIS — J0100 Acute maxillary sinusitis, unspecified: Secondary | ICD-10-CM

## 2017-03-02 DIAGNOSIS — K121 Other forms of stomatitis: Secondary | ICD-10-CM | POA: Diagnosis not present

## 2017-03-02 DIAGNOSIS — I10 Essential (primary) hypertension: Secondary | ICD-10-CM | POA: Diagnosis not present

## 2017-03-02 DIAGNOSIS — I2510 Atherosclerotic heart disease of native coronary artery without angina pectoris: Secondary | ICD-10-CM

## 2017-03-02 MED ORDER — AMOXICILLIN 500 MG PO CAPS: 500.0000 mg | ORAL_CAPSULE | Freq: Two times a day (BID) | ORAL | 0 refills | Status: AC

## 2017-03-02 MED ORDER — MAGIC MOUTHWASH W/LIDOCAINE: 5.0000 mL | Freq: Three times a day (TID) | ORAL | 0 refills | Status: DC | PRN

## 2017-03-02 NOTE — Telephone Encounter (Signed)
Copied from [place] [id number]. Topic: Quick Communication - See Telephone Encounter >> Mar 02, 2017  4:18 PM Vernona Rieger wrote: CRM for notification. See Telephone encounter for:   03/02/17.  [facility] pharmacy called and said that the pt came in with a script for "magic mouthwash", he said that he needs clarification on this. There are several different recipes for this. Pt is at pharmacy now. Please advise.  [id number] HIT 0 as soon as it comes on.

## 2017-03-02 NOTE — Progress Notes (Signed)
Subjective:    Patient ID: Keith Murillo, male    DOB: 1950-06-25, 67 y.o.   MRN: [id number]  Chief Complaint  Patient presents with  . Follow-up    HPI Patient was seen today for ongoing concern.  Patient endorses battling cold/sinus symptoms x 2 or 3 weeks. Pt is starting to have yellow green thick mucus, teeth pain, facial pain and pressure.  Pt also mentions he has a sore area on the top of his mouth.  He noticed this on Saturday.  Pt thinks he was eating chips on Friday evening.  Pt notes because sore with eating.  He has tried [employer].  Pt also made an appointment w/ a dentist, however that isn't until a wks or so.  Patient asked about blood pressure as it is elevated today in clinic.  Patient states in the past he was on blood pressure medicine but then was able to get off of it.  Patient states at last office visit it was noted to be elevated but not this high.  Past Medical History:  Diagnosis Date  . Chest pain   . Chronic anticoagulation   . Heart attack ([place])   . Hypercholesterolemia   . Hyperlipidemia   . Ischemic heart disease September 2004   Known--with non-Q-wave myocardial infarction   . Obesity   . Sleep apnea     No Known Allergies  ROS General: Denies fever, chills, night sweats, changes in weight, changes in appetite HEENT: Denies headaches, ear pain, changes in vision, rhinorrhea, sore throat  + sore on the roof of mouth, sinus pressure, facial pain, tooth pain CV: Denies CP, palpitations, SOB, orthopnea Pulm: Denies SOB, cough, wheezing GI: Denies abdominal pain, nausea, vomiting, diarrhea, constipation GU: Denies dysuria, hematuria, frequency, vaginal discharge Msk: Denies muscle cramps, joint pains Neuro: Denies weakness, numbness, tingling Skin: Denies rashes, bruising Psych: Denies depression, anxiety, hallucinations     Objective:    Blood pressure (!) 180/84, pulse (!) 103, temperature 98.5 F (36.9 C), temperature source Oral, weight  240 lb (108.9 kg), SpO2 98 %.   Gen. Pleasant, well-nourished, in no distress, normal affect   HEENT: [place]/AT, face symmetric, no scleral icterus, PERRLA, nares patent with mild drainage, no TTP of face, soft palate small area of irritation, no laceration noted.  pharynx without erythema or exudate.  TMs normal bilaterally.  No cervical lymphadenopathy. Lungs: no accessory muscle use, CTAB, no wheezes or rales Cardiovascular: RRR, no m/r/g, no peripheral edema Abdomen: BS present, soft, NT/ND Neuro:  A&Ox3, CN II-XII intact, normal gait    Wt Readings from Last 3 Encounters:  03/02/17 240 lb (108.9 kg)  01/30/17 230 lb 4 oz (104.4 kg)  01/14/17 230 lb 8 oz (104.6 kg)    Lab Results  Component Value Date   WBC 7.6 10/31/2016   HGB 15.8 10/31/2016   HCT 45.8 10/31/2016   PLT 316.0 10/31/2016   GLUCOSE 127 (H) 10/31/2016   CHOL 160 10/31/2016   TRIG 189.0 (H) 10/31/2016   HDL 40.70 10/31/2016   LDLCALC 82 10/31/2016   ALT 38 10/31/2016   AST 27 10/31/2016   NA 137 10/31/2016   K 4.3 10/31/2016   CL 103 10/31/2016   CREATININE 0.90 10/31/2016   BUN 16 10/31/2016   CO2 27 10/31/2016   TSH 2.14 10/31/2016   PSA 3.04 11/21/2016   INR 1.0 11/29/2007   HGBA1C 5.4 10/31/2016    Assessment/Plan:  Subacute maxillary sinusitis  -Given handout -Okay to take Tylenol  as needed pain/discomfort - Plan: amoxicillin (AMOXIL) 500 MG capsule  Soft palate ulceration -Avoid hard foods until area heals -Can also use warm salt water to rinse mouth out - Plan: magic mouthwash w/lidocaine SOLN  Essential hypertension -BP uncontrolled -Patient reluctant to start medication -Schedule BP follow-up appointment with patient's PCP on Friday.  Patient encouraged to keep this appointment. -Patient given RTC or ED precautions  Follow-up PRN  Grier Mitts, MD

## 2017-03-02 NOTE — Patient Instructions (Addendum)
DASH Eating Plan DASH stands for "Dietary Approaches to Stop Hypertension." The DASH eating plan is a healthy eating plan that has been shown to reduce high blood pressure (hypertension). It may also reduce your risk for type 2 diabetes, heart disease, and stroke. The DASH eating plan may also help with weight loss. What are tips for following this plan? General guidelines  Avoid eating more than 2,300 mg (milligrams) of salt (sodium) a day. If you have hypertension, you may need to reduce your sodium intake to 1,500 mg a day.  Limit alcohol intake to no more than 1 drink a day for nonpregnant women and 2 drinks a day for men. One drink equals 12 oz of beer, 5 oz of wine, or 1 oz of hard liquor.  Work with your health care provider to maintain a healthy body weight or to lose weight. Ask what an ideal weight is for you.  Get at least 30 minutes of exercise that causes your heart to beat faster (aerobic exercise) most days of the week. Activities may include walking, swimming, or biking.  Work with your health care provider or diet and nutrition specialist (dietitian) to adjust your eating plan to your individual calorie needs. Reading food labels  Check food labels for the amount of sodium per serving. Choose foods with less than 5 percent of the Daily Value of sodium. Generally, foods with less than 300 mg of sodium per serving fit into this eating plan.  To find whole grains, look for the word "whole" as the first word in the ingredient list. Shopping  Buy products labeled as "low-sodium" or "no salt added."  Buy fresh foods. Avoid canned foods and premade or frozen meals. Cooking  Avoid adding salt when cooking. Use salt-free seasonings or herbs instead of table salt or sea salt. Check with your health care provider or pharmacist before using salt substitutes.  Do not fry foods. Cook foods using healthy methods such as baking, boiling, grilling, and broiling instead.  Cook with  heart-healthy oils, such as olive, canola, soybean, or sunflower oil. Meal planning   Eat a balanced diet that includes: ? 5 or more servings of fruits and vegetables each day. At each meal, try to fill half of your plate with fruits and vegetables. ? Up to 6-8 servings of whole grains each day. ? Less than 6 oz of lean meat, poultry, or fish each day. A 3-oz serving of meat is about the same size as a deck of cards. One egg equals 1 oz. ? 2 servings of low-fat dairy each day. ? A serving of nuts, seeds, or beans 5 times each week. ? Heart-healthy fats. Healthy fats called Omega-3 fatty acids are found in foods such as flaxseeds and coldwater fish, like sardines, salmon, and mackerel.  Limit how much you eat of the following: ? Canned or prepackaged foods. ? Food that is high in trans fat, such as fried foods. ? Food that is high in saturated fat, such as fatty meat. ? Sweets, desserts, sugary drinks, and other foods with added sugar. ? Full-fat dairy products.  Do not salt foods before eating.  Try to eat at least 2 vegetarian meals each week.  Eat more home-cooked food and less restaurant, buffet, and fast food.  When eating at a restaurant, ask that your food be prepared with less salt or no salt, if possible. What foods are recommended? The items listed may not be a complete list. Talk with your dietitian about what   dietary choices are best for you. Grains Whole-grain or whole-wheat bread. Whole-grain or whole-wheat pasta. Brown rice. Oatmeal. Quinoa. Bulgur. Whole-grain and low-sodium cereals. Pita bread. Low-fat, low-sodium crackers. Whole-wheat flour tortillas. Vegetables Fresh or frozen vegetables (raw, steamed, roasted, or grilled). Low-sodium or reduced-sodium tomato and vegetable juice. Low-sodium or reduced-sodium tomato sauce and tomato paste. Low-sodium or reduced-sodium canned vegetables. Fruits All fresh, dried, or frozen fruit. Canned fruit in natural juice (without  added sugar). Meat and other protein foods Skinless chicken or turkey. Ground chicken or turkey. Pork with fat trimmed off. Fish and seafood. Egg whites. Dried beans, peas, or lentils. Unsalted nuts, nut butters, and seeds. Unsalted canned beans. Lean cuts of beef with fat trimmed off. Low-sodium, lean deli meat. Dairy Low-fat (1%) or fat-free (skim) milk. Fat-free, low-fat, or reduced-fat cheeses. Nonfat, low-sodium ricotta or cottage cheese. Low-fat or nonfat yogurt. Low-fat, low-sodium cheese. Fats and oils Soft margarine without trans fats. Vegetable oil. Low-fat, reduced-fat, or light mayonnaise and salad dressings (reduced-sodium). Canola, safflower, olive, soybean, and sunflower oils. Avocado. Seasoning and other foods Herbs. Spices. Seasoning mixes without salt. Unsalted popcorn and pretzels. Fat-free sweets. What foods are not recommended? The items listed may not be a complete list. Talk with your dietitian about what dietary choices are best for you. Grains Baked goods made with fat, such as croissants, muffins, or some breads. Dry pasta or rice meal packs. Vegetables Creamed or fried vegetables. Vegetables in a cheese sauce. Regular canned vegetables (not low-sodium or reduced-sodium). Regular canned tomato sauce and paste (not low-sodium or reduced-sodium). Regular tomato and vegetable juice (not low-sodium or reduced-sodium). Pickles. Olives. Fruits Canned fruit in a light or heavy syrup. Fried fruit. Fruit in cream or butter sauce. Meat and other protein foods Fatty cuts of meat. Ribs. Fried meat. Bacon. Sausage. Bologna and other processed lunch meats. Salami. Fatback. Hotdogs. Bratwurst. Salted nuts and seeds. Canned beans with added salt. Canned or smoked fish. Whole eggs or egg yolks. Chicken or turkey with skin. Dairy Whole or 2% milk, cream, and half-and-half. Whole or full-fat cream cheese. Whole-fat or sweetened yogurt. Full-fat cheese. Nondairy creamers. Whipped toppings.  Processed cheese and cheese spreads. Fats and oils Butter. Stick margarine. Lard. Shortening. Ghee. Bacon fat. Tropical oils, such as coconut, palm kernel, or palm oil. Seasoning and other foods Salted popcorn and pretzels. Onion salt, garlic salt, seasoned salt, table salt, and sea salt. Worcestershire sauce. Tartar sauce. Barbecue sauce. Teriyaki sauce. Soy sauce, including reduced-sodium. Steak sauce. Canned and packaged gravies. Fish sauce. Oyster sauce. Cocktail sauce. Horseradish that you find on the shelf. Ketchup. Mustard. Meat flavorings and tenderizers. Bouillon cubes. Hot sauce and Tabasco sauce. Premade or packaged marinades. Premade or packaged taco seasonings. Relishes. Regular salad dressings. Where to find more information:  National Heart, Lung, and Blood Institute: www.nhlbi.nih.gov  American Heart Association: www.heart.org Summary  The DASH eating plan is a healthy eating plan that has been shown to reduce high blood pressure (hypertension). It may also reduce your risk for type 2 diabetes, heart disease, and stroke.  With the DASH eating plan, you should limit salt (sodium) intake to 2,300 mg a day. If you have hypertension, you may need to reduce your sodium intake to 1,500 mg a day.  When on the DASH eating plan, aim to eat more fresh fruits and vegetables, whole grains, lean proteins, low-fat dairy, and heart-healthy fats.  Work with your health care provider or diet and nutrition specialist (dietitian) to adjust your eating plan to your individual   calorie needs. This information is not intended to replace advice given to you by your health care provider. Make sure you discuss any questions you have with your health care provider. Document Released: 12/12/2010 Document Revised: 12/17/2015 Document Reviewed: 12/17/2015 Elsevier Interactive Patient Education  2018 [employer].  Sinusitis, Adult Sinusitis is soreness and inflammation of your sinuses. Sinuses are hollow  spaces in the bones around your face. Your sinuses are located:  Around your eyes.  In the middle of your forehead.  Behind your nose.  In your cheekbones.  Your sinuses and nasal passages are lined with a stringy fluid (mucus). Mucus normally drains out of your sinuses. When your nasal tissues become inflamed or swollen, the mucus can become trapped or blocked so air cannot flow through your sinuses. This allows bacteria, viruses, and funguses to grow, which leads to infection. Sinusitis can develop quickly and last for 7?10 days (acute) or for more than 12 weeks (chronic). Sinusitis often develops after a cold. What are the causes? This condition is caused by anything that creates swelling in the sinuses or stops mucus from draining, including:  Allergies.  Asthma.  Bacterial or viral infection.  Abnormally shaped bones between the nasal passages.  Nasal growths that contain mucus (nasal polyps).  Narrow sinus openings.  Pollutants, such as chemicals or irritants in the air.  A foreign object stuck in the nose.  A fungal infection. This is rare.  What increases the risk? The following factors may make you more likely to develop this condition:  Having allergies or asthma.  Having had a recent cold or respiratory tract infection.  Having structural deformities or blockages in your nose or sinuses.  Having a weak immune system.  Doing a lot of swimming or diving.  Overusing nasal sprays.  Smoking.  What are the signs or symptoms? The main symptoms of this condition are pain and a feeling of pressure around the affected sinuses. Other symptoms include:  Upper toothache.  Earache.  Headache.  Bad breath.  Decreased sense of smell and taste.  A cough that may get worse at night.  Fatigue.  Fever.  Thick drainage from your nose. The drainage is often green and it may contain pus (purulent).  Stuffy nose or congestion.  Postnasal drip. This is when  extra mucus collects in the throat or back of the nose.  Swelling and warmth over the affected sinuses.  Sore throat.  Sensitivity to light.  How is this diagnosed? This condition is diagnosed based on symptoms, a medical history, and a physical exam. To find out if your condition is acute or chronic, your health care provider may:  Look in your nose for signs of nasal polyps.  Tap over the affected sinus to check for signs of infection.  View the inside of your sinuses using an imaging device that has a light attached (endoscope).  If your health care provider suspects that you have chronic sinusitis, you may also:  Be tested for allergies.  Have a sample of mucus taken from your nose (nasal culture) and checked for bacteria.  Have a mucus sample examined to see if your sinusitis is related to an allergy.  If your sinusitis does not respond to treatment and it lasts longer than 8 weeks, you may have an MRI or CT scan to check your sinuses. These scans also help to determine how severe your infection is. In rare cases, a bone biopsy may be done to rule out more serious types  of fungal sinus disease. How is this treated? Treatment for sinusitis depends on the cause and whether your condition is chronic or acute. If a virus is causing your sinusitis, your symptoms will go away on their own within 10 days. You may be given medicines to relieve your symptoms, including:  Topical nasal decongestants. They shrink swollen nasal passages and let mucus drain from your sinuses.  Antihistamines. These drugs block inflammation that is triggered by allergies. This can help to ease swelling in your nose and sinuses.  Topical nasal corticosteroids. These are nasal sprays that ease inflammation and swelling in your nose and sinuses.  Nasal saline washes. These rinses can help to get rid of thick mucus in your nose.  If your condition is caused by bacteria, you will be given an antibiotic  medicine. If your condition is caused by a fungus, you will be given an antifungal medicine. Surgery may be needed to correct underlying conditions, such as narrow nasal passages. Surgery may also be needed to remove polyps. Follow these instructions at home: Medicines  Take, use, or apply over-the-counter and prescription medicines only as told by your health care provider. These may include nasal sprays.  If you were prescribed an antibiotic medicine, take it as told by your health care provider. Do not stop taking the antibiotic even if you start to feel better. Hydrate and Humidify  Drink enough water to keep your urine clear or pale yellow. Staying hydrated will help to thin your mucus.  Use a cool mist humidifier to keep the humidity level in your home above 50%.  Inhale steam for 10-15 minutes, 3-4 times a day or as told by your health care provider. You can do this in the bathroom while a hot shower is running.  Limit your exposure to cool or dry air. Rest  Rest as much as possible.  Sleep with your head raised (elevated).  Make sure to get enough sleep each night. General instructions  Apply a warm, moist washcloth to your face 3-4 times a day or as told by your health care provider. This will help with discomfort.  Wash your hands often with soap and water to reduce your exposure to viruses and other germs. If soap and water are not available, use hand sanitizer.  Do not smoke. Avoid being around people who are smoking (secondhand smoke).  Keep all follow-up visits as told by your health care provider. This is important. Contact a health care provider if:  You have a fever.  Your symptoms get worse.  Your symptoms do not improve within 10 days. Get help right away if:  You have a severe headache.  You have persistent vomiting.  You have pain or swelling around your face or eyes.  You have vision problems.  You develop confusion.  Your neck is  stiff.  You have trouble breathing. This information is not intended to replace advice given to you by your health care provider. Make sure you discuss any questions you have with your health care provider. Document Released: 12/23/2004 Document Revised: 08/19/2015 Document Reviewed: 10/18/2014 Elsevier Interactive Patient Education  2018 [employer].  Oral Ulcers Oral ulcers are sores inside the mouth or near the mouth. They may be called canker sores or cold sores, which are two types of oral ulcers. Many oral ulcers are harmless and go away on their own. In some cases, oral ulcers may require medical care to determine the cause and proper treatment. What are the causes? Common  causes of this condition include:  Viral, bacterial, or fungal infection.  Emotional stress.  Foods or chemicals that irritate the mouth.  Injury or physical irritation of the mouth.  Medicines.  Allergies.  Tobacco use.  Less common causes include:  Skin disease.  A type of herpes virus infection (herpes simplexor herpes zoster).  Oral cancer.  In some cases, the cause of this condition may not be known. What increases the risk? Oral ulcers are more likely to develop in:  People who wear dental braces, dentures, or retainers.  People who do not keep their mouth clean or brush their teeth regularly.  People who have sensitive skin.  People who have conditions that affect the entire body (systemic conditions), such as immune disorders.  What are the signs or symptoms? The main symptom of this condition is one or more oval-shaped or round ulcers that have red borders. Details about symptoms may vary depending on the cause.  Location of the ulcers. They may be inside the mouth, on the gums, or on the insides of the lips or cheeks. They may also be on the lips or on skin that is near the mouth, such as the cheeks and chin.  Pain. Ulcers can be painful and uncomfortable, or they can be  painless.  Appearance of the ulcers. They may look like red blisters and be filled with fluid, or they may be white or yellow patches.  Frequency of outbreaks. Ulcers may go away permanently after one outbreak, or they may come back (recur) often or rarely.  How is this diagnosed? This condition is diagnosed with a physical exam. Your health care provider may ask you questions about your lifestyle and your medical history. You may have tests, including:  Blood tests.  Removal of a small number of cells from an ulcer to be examined under a microscope (biopsy).  How is this treated? This condition is treated by managing any pain and discomfort, and by treating the underlying cause of the ulcers, if necessary. Usually, oral ulcers resolve by themselves in 1-2 weeks. You may be told to keep your mouth clean and avoid things that cause or irritate your ulcers. Your health care provider may prescribe medicines to reduce pain and discomfort or treat the underlying cause, if this applies. Follow these instructions at home: Lifestyle  Follow instructions from your health care provider about eating or drinking restrictions. ? Drink enough fluid to keep your urine clear or pale yellow. ? Avoid foods and drinks that irritate your ulcers.  Avoid tobacco products, including cigarettes, chewing tobacco, or e-cigarettes. If you need help quitting, ask your health care provider.  Avoid excessive alcohol use. Oral Hygiene  Avoid physical or chemical irritants that may have caused the ulcers or made them worse, such as mouthwashes that contain alcohol (ethanol). If you wear dental braces, dentures, or retainers, work with your health care provider to make sure these devices are fitted correctly.  Brush and floss your teeth at least once every day, and get regular dental cleanings and checkups.  Gargle with a salt-water mixture 3-4 times per day or as told by your health care provider. To make a  salt-water mixture, completely dissolve -1 tsp of salt in 1 cup of warm water. General instructions  Take over-the-counter and prescription medicines only as told by your health care provider.  If you have pain, wrap a cold compress in a towel and gently press it against your face to help reduce pain.  Keep  all follow-up visits as told by your health care provider. This is important. Contact a health care provider if:  You have pain that gets worse or does not get better with medicine.  You have 4 or more ulcers at one time.  You have a fever.  You have new ulcers that look or feel different from other ulcers you have.  You have inflammation in one eye or both eyes.  You have ulcers that do not go away after 10 days.  You develop new symptoms in your mouth, such as: ? Bleeding or crusting around your lips or gums. ? Tooth pain. ? Difficulty swallowing.  You develop symptoms on your skin or genitals, such as: ? A rash or blisters. ? Burning or itching sensations.  Your ulcers begin or get worse after you start a new medicine. Get help right away if:  You have difficulty breathing.  You have swelling in your face or neck.  You have excessive bleeding from your mouth.  You have severe pain. This information is not intended to replace advice given to you by your health care provider. Make sure you discuss any questions you have with your health care provider. Document Released: 01/31/2004 Document Revised: 05/28/2015 Document Reviewed: 05/10/2014 Elsevier Interactive Patient Education  [employer].

## 2017-03-04 ENCOUNTER — Ambulatory Visit (INDEPENDENT_AMBULATORY_CARE_PROVIDER_SITE_OTHER): Payer: Medicare Other | Admitting: Internal Medicine

## 2017-03-04 ENCOUNTER — Ambulatory Visit (INDEPENDENT_AMBULATORY_CARE_PROVIDER_SITE_OTHER): Payer: Medicare Other | Admitting: Family Medicine

## 2017-03-04 ENCOUNTER — Encounter: Payer: Self-pay | Admitting: Family Medicine

## 2017-03-04 ENCOUNTER — Encounter: Payer: Self-pay | Admitting: Internal Medicine

## 2017-03-04 ENCOUNTER — Telehealth: Payer: Self-pay

## 2017-03-04 VITALS — BP 158/100 | HR 71 | Temp 98.5°F | Wt 232.5 lb

## 2017-03-04 VITALS — BP 142/82 | HR 87 | Ht 67.25 in | Wt 233.0 lb

## 2017-03-04 DIAGNOSIS — I10 Essential (primary) hypertension: Secondary | ICD-10-CM | POA: Diagnosis not present

## 2017-03-04 DIAGNOSIS — K121 Other forms of stomatitis: Secondary | ICD-10-CM | POA: Diagnosis not present

## 2017-03-04 DIAGNOSIS — G4719 Other hypersomnia: Secondary | ICD-10-CM

## 2017-03-04 DIAGNOSIS — I2510 Atherosclerotic heart disease of native coronary artery without angina pectoris: Secondary | ICD-10-CM

## 2017-03-04 DIAGNOSIS — N401 Enlarged prostate with lower urinary tract symptoms: Secondary | ICD-10-CM

## 2017-03-04 DIAGNOSIS — G4733 Obstructive sleep apnea (adult) (pediatric): Secondary | ICD-10-CM | POA: Diagnosis not present

## 2017-03-04 DIAGNOSIS — E291 Testicular hypofunction: Secondary | ICD-10-CM

## 2017-03-04 MED ORDER — LISINOPRIL 10 MG PO TABS
10.0000 mg | ORAL_TABLET | Freq: Every day | ORAL | 3 refills | Status: DC
Start: 2017-03-04 — End: 2017-07-14

## 2017-03-04 NOTE — Patient Instructions (Signed)
Good to see you today  Please stop and see Rosaria Ferries on your way out  Schedule a follow up in 4 weeks for blood pressure check and labs- check your blood pressure 2-3 times per week, bring your log with you  Lisinopril tablets What is this medicine? LISINOPRIL (lyse IN oh pril) is an ACE inhibitor. This medicine is used to treat high blood pressure and heart failure. It is also used to protect the heart immediately after a heart attack. This medicine may be used for other purposes; ask your health care provider or pharmacist if you have questions. COMMON BRAND NAME(S): Prinivil, Zestril What should I tell my health care provider before I take this medicine? They need to know if you have any of these conditions: -diabetes -heart or blood vessel disease -kidney disease -low blood pressure -previous swelling of the tongue, face, or lips with difficulty breathing, difficulty swallowing, hoarseness, or tightening of the throat -an unusual or allergic reaction to lisinopril, other ACE inhibitors, insect venom, foods, dyes, or preservatives -pregnant or trying to get pregnant -breast-feeding How should I use this medicine? Take this medicine by mouth with a glass of water. Follow the directions on your prescription label. You may take this medicine with or without food. If it upsets your stomach, take it with food. Take your medicine at regular intervals. Do not take it more often than directed. Do not stop taking except on your doctor's advice. Talk to your pediatrician regarding the use of this medicine in children. Special care may be needed. While this drug may be prescribed for children as young as 22 years of age for selected conditions, precautions do apply. Overdosage: If you think you have taken too much of this medicine contact a poison control center or emergency room at once. NOTE: This medicine is only for you. Do not share this medicine with others. What if I miss a dose? If you miss a  dose, take it as soon as you can. If it is almost time for your next dose, take only that dose. Do not take double or extra doses. What may interact with this medicine? Do not take this medicine with any of the following medications: -hymenoptera venom -sacubitril; valsartan This medicines may also interact with the following medications: -aliskiren -angiotensin receptor blockers, like losartan or valsartan -certain medicines for diabetes -diuretics -everolimus -gold compounds -lithium -NSAIDs, medicines for pain and inflammation, like ibuprofen or naproxen -potassium salts or supplements -salt substitutes -sirolimus -temsirolimus This list may not describe all possible interactions. Give your health care provider a list of all the medicines, herbs, non-prescription drugs, or dietary supplements you use. Also tell them if you smoke, drink alcohol, or use illegal drugs. Some items may interact with your medicine. What should I watch for while using this medicine? Visit your doctor or health care professional for regular check ups. Check your blood pressure as directed. Ask your doctor what your blood pressure should be, and when you should contact him or her. Do not treat yourself for coughs, colds, or pain while you are using this medicine without asking your doctor or health care professional for advice. Some ingredients may increase your blood pressure. Women should inform their doctor if they wish to become pregnant or think they might be pregnant. There is a potential for serious side effects to an unborn child. Talk to your health care professional or pharmacist for more information. Check with your doctor or health care professional if you get an attack  of severe diarrhea, nausea and vomiting, or if you sweat a lot. The loss of too much body fluid can make it dangerous for you to take this medicine. You may get drowsy or dizzy. Do not drive, use machinery, or do anything that needs  mental alertness until you know how this drug affects you. Do not stand or sit up quickly, especially if you are an older patient. This reduces the risk of dizzy or fainting spells. Alcohol can make you more drowsy and dizzy. Avoid alcoholic drinks. Avoid salt substitutes unless you are told otherwise by your doctor or health care professional. What side effects may I notice from receiving this medicine? Side effects that you should report to your doctor or health care professional as soon as possible: -allergic reactions like skin rash, itching or hives, swelling of the hands, feet, face, lips, throat, or tongue -breathing problems -signs and symptoms of kidney injury like trouble passing urine or change in the amount of urine -signs and symptoms of increased potassium like muscle weakness; chest pain; or fast, irregular heartbeat -signs and symptoms of liver injury like dark yellow or brown urine; general ill feeling or flu-like symptoms; light-colored stools; loss of appetite; nausea; right upper belly pain; unusually weak or tired; yellowing of the eyes or skin -signs and symptoms of low blood pressure like dizziness; feeling faint or lightheaded, falls; unusually weak or tired -stomach pain with or without nausea and vomiting Side effects that usually do not require medical attention (report to your doctor or health care professional if they continue or are bothersome): -changes in taste -cough -dizziness -fever -headache -sensitivity to light This list may not describe all possible side effects. Call your doctor for medical advice about side effects. You may report side effects to FDA at 1-800-FDA-1088. Where should I keep my medicine? Keep out of the reach of children. Store at room temperature between 15 and 30 degrees C (59 and 86 degrees F). Protect from moisture. Keep container tightly closed. Throw away any unused medicine after the expiration date. NOTE: This sheet is a summary. It  may not cover all possible information. If you have questions about this medicine, talk to your doctor, pharmacist, or health care provider.  2018 Elsevier/Gold Standard (2015-02-12 12:52:35)

## 2017-03-04 NOTE — Telephone Encounter (Signed)
Spoke with pt in reference to lab results and needing OV in April.  Pt voiced understanding. Appts made and orders placed.

## 2017-03-04 NOTE — Patient Instructions (Signed)
Will order sleep study.   Sleep Apnea       Sleep apnea is disorder that affects a person's sleep. A person with sleep apnea has abnormal pauses in their breathing when they sleep. It is hard for them to get a good sleep. This makes a person tired during the day. It also can lead to other physical problems. There are three types of sleep apnea. One type is when breathing stops for a short time because your airway is blocked (obstructive sleep apnea). Another type is when the brain sometimes fails to give the normal signal to breathe to the muscles that control your breathing (central sleep apnea). The third type is a combination of the other two types. HOME CARE   Take all medicine as told by your doctor.  Avoid alcohol, calming medicines (sedatives), and depressant drugs.  Try to lose weight if you are overweight. Talk to your doctor about a healthy weight goal.  Your doctor may have you use a device that helps to open your airway. It can help you get the air that you need. It is called a positive airway pressure (PAP) device.   MAKE SURE YOU:   Understand these instructions.  Will watch your condition.  Will get help right away if you are not doing well or get worse.  It may take approximately 1 month for you to get used to wearing her CPAP every night.  Be sure to work with your machine to get used to it, be patient, it may take time!

## 2017-03-04 NOTE — Progress Notes (Signed)
Subjective:    Patient ID: Keith Murillo, male    DOB: [birth year], 67 y.o.   MRN: [id number]  HPI This is a 67 yo male who presents today for follow up of elevated blood pressure readings. He brings home readings showing readings of 145-201/90-112. Was on antihypertensives prior but came off of then and blood pressure was ok following weight loss. Denies chest pain, SOB, has had mild headache. 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   Was seen two days ago and diagnosed with dub acute maxillary sinusitis and soft palate ulceration. Doing better since starting antibiotic.   Improved energy with testosterone.   Has history of OSA and has not used CPAP for over 2 years. Machine over 42 years old. Wife witnesses periods of apnea and snoring. Was able to stop using CPAP when weight was down.    Past Medical History:  Diagnosis Date  . Chest pain   . Chronic anticoagulation   . Heart attack ([place])   . Hypercholesterolemia   . Hyperlipidemia   . Ischemic heart disease September 2004   Known--with non-Q-wave myocardial infarction   . Obesity   . Sleep apnea    Past Surgical History:  Procedure Laterality Date  . CARDIAC CATHETERIZATION  11/2007   Family History  Problem Relation Age of Onset  . Cancer Mother   . Hearing loss Father   . Cancer Maternal Grandmother   . Parkinson's disease Maternal Grandfather   . Stroke Maternal Grandfather   . Stroke Paternal Grandmother   . Heart attack Paternal Grandfather    Social History   Tobacco Use  . Smoking status: Never Smoker  . Smokeless tobacco: Never Used  Substance Use Topics  . Alcohol use: Yes    Comment: occas  . Drug use: No     Review of Systems Per HPI    Objective:   Physical Exam  Constitutional: He is oriented to person, place, and time. He appears well-developed and well-nourished. No distress.  HENT:  Head: Atraumatic.  Mouth/Throat:    Eyes: Conjunctivae are normal.    Cardiovascular: Normal rate, regular rhythm and normal heart sounds.  Pulmonary/Chest: Effort normal and breath sounds normal.  Musculoskeletal: He exhibits no edema.  Neurological: He is alert and oriented to person, place, and time.  Skin: Skin is warm and dry. He is not diaphoretic.  Vitals reviewed.     BP (!) 158/100   Pulse 71   Temp 98.5 F (36.9 C) (Oral)   Wt 232 lb 8 oz (105.5 kg)   SpO2 97%   BMI 36.14 kg/m  Wt Readings from Last 3 Encounters:  03/04/17 232 lb 8 oz (105.5 kg)  03/02/17 240 lb (108.9 kg)  01/30/17 230 lb 4 oz (104.4 kg)   BP Readings from Last 3 Encounters:  03/04/17 (!) 158/100  03/02/17 (!) 180/84  01/30/17 (!) 144/82   Recheck BP- 148/94    Assessment & Plan:  1. Essential hypertension - Provided written and verbal information regarding diagnosis and treatment. - lisinopril (PRINIVIL,ZESTRIL) 10 MG tablet; Take 1 tablet (10 mg total) by mouth daily.  Dispense: 30 tablet; Refill: 3 - follow up in 1 month for bp recheck and labs, he will keep bp log and notify me if no improvement of BP after starting medication  2. OSA (obstructive sleep apnea) - known OSA and hasn't used CPAP in years, needs new sleep study and equipment - Ambulatory referral to Pulmonology  3.  Soft palate ulceration - improving, he was not able to get magic mouthwash prescription filled but is doing warm salt water gargles with improvement   Clarene Reamer, FNP-BC  [facility]  03/04/2017 1:57 PM

## 2017-03-04 NOTE — Telephone Encounter (Signed)
-----   Message from Abbie Sons, MD sent at 03/01/2017  9:28 AM EST ----- Testosterone level was normal at 825.  He may continue present dose.  Recommend a follow-up visit with testosterone, hematocrit and PSA April 2019

## 2017-03-04 NOTE — Progress Notes (Signed)
[facility] Consultation      Assessment and Plan:  Obstructive sleep apnea with excessive daytime sleepiness. -Known history of obstructive sleep apnea, now with worsening daytime sleepiness. -We will send for new sleep study in order to requalify him for CPAP.  Essential hypertension. - Sleep apnea can contribute to elevated blood pressure, therefore treatment of sleep apnea is important part of treatment of the patient's blood pressure.   Date: 03/04/2017  MRN# [id number] JYAIR KIRALY [birth year]  Referring Physician: NP Terese Door is a 67 y.o. old male seen in consultation for chief complaint of:    Chief Complaint  Patient presents with  . Advice Only    sleep consult: daytime sleepiness: snores, wakes up strugging for air    HPI:   Patient typically goes to bed between 10 PM and midnight.  He falls asleep quickly, usually wakes up a few times per night, and gets out of bed at 6 AM. He has been diagnosed with OSA around 2005, he used it but eventually lost some weight and felt that he was sleeping better. He stopped using it about 5 years ago, then has used it on and off if he felt that he was feeling more tired during the day. He has falleen asleep at stop light and this was an indicator that he needed to be back on CPAP, so he would use it.  He continues to have significant daytime sleepiness symptoms, has trouble staying awake during the day. His old CPAP still works, he recently has been having trouble with hypertension.    PMHX:   Past Medical History:  Diagnosis Date  . Chest pain   . Chronic anticoagulation   . Heart attack ([place])   . Hypercholesterolemia   . Hyperlipidemia   . Ischemic heart disease September 2004   Known--with non-Q-wave myocardial infarction   . Obesity   . Sleep apnea    Surgical Hx:  Past Surgical History:  Procedure Laterality Date  . CARDIAC CATHETERIZATION  11/2007   Family Hx:  Family  History  Problem Relation Age of Onset  . Cancer Mother   . Hearing loss Father   . Cancer Maternal Grandmother   . Parkinson's disease Maternal Grandfather   . Stroke Maternal Grandfather   . Stroke Paternal Grandmother   . Heart attack Paternal Grandfather    Social Hx:   Social History   Tobacco Use  . Smoking status: Never Smoker  . Smokeless tobacco: Never Used  Substance Use Topics  . Alcohol use: Yes    Comment: occas  . Drug use: No   Medication:    Current Outpatient Medications:  .  amoxicillin (AMOXIL) 500 MG capsule, Take 1 capsule (500 mg total) by mouth 2 (two) times daily for 7 days., Disp: 14 capsule, Rfl: 0 .  aspirin 325 MG tablet, Take 325 mg by mouth daily., Disp: , Rfl:  .  AZO-CRANBERRY PO, Take by mouth., Disp: , Rfl:  .  B Complex Vitamins (B COMPLEX PO), Take by mouth daily., Disp: , Rfl:  .  Bioflavonoid Products (GRAPE SEED PO), Take by mouth daily., Disp: , Rfl:  .  Biotin 5000 MCG CAPS, Take by mouth., Disp: , Rfl:  .  DHEA 25 MG CAPS, Take by mouth., Disp: , Rfl:  .  GLUCOSAMINE HCL PO, Take by mouth., Disp: , Rfl:  .  L-Arginine 1000 MG TABS, Take by mouth., Disp: , Rfl:  .  lisinopril (PRINIVIL,ZESTRIL)  10 MG tablet, Take 1 tablet (10 mg total) by mouth daily., Disp: 30 tablet, Rfl: 3 .  sildenafil (REVATIO) 20 MG tablet, 2-5 tabs 1 hour prior to intercourse, Disp: 90 tablet, Rfl: 1 .  testosterone cypionate (DEPOTESTOSTERONE CYPIONATE) 200 MG/ML injection, Inject 0.5 mLs (100 mg total) into the muscle once a week., Disp: 10 mL, Rfl: 0 .  vitamin C (ASCORBIC ACID) 500 MG tablet, Take 500 mg by mouth daily., Disp: , Rfl:    Allergies:  Patient has no known allergies.  Review of Systems: Gen:  Denies  fever, sweats, chills HEENT: Denies blurred vision, double vision. bleeds, sore throat Cvc:  No dizziness, chest pain. Resp:   Denies cough or sputum production, shortness of breath Gi: Denies swallowing difficulty, stomach pain. Gu:  Denies  bladder incontinence, burning urine Ext:   No Joint pain, stiffness. Skin: No skin rash,  hives  Endoc:  No polyuria, polydipsia. Psych: No depression, insomnia. Other:  All other systems were reviewed with the patient and were negative other that what is mentioned in the HPI.   Physical Examination:   VS: BP (!) 142/82 (BP Location: Left Arm, Cuff Size: Normal)   Pulse 87   Ht 5' 7.25" (1.708 m)   Wt 233 lb (105.7 kg)   SpO2 99%   BMI 36.22 kg/m   General Appearance: No distress  Neuro:without focal findings,  speech normal,  HEENT: PERRLA, EOM intact.   Pulmonary: normal breath sounds, No wheezing.  CardiovascularNormal S1,S2.  No m/r/g.   Abdomen: Benign, Soft, non-tender. Renal:  No costovertebral tenderness  GU:  No performed at this time. Endoc: No evident thyromegaly, no signs of acromegaly. Skin:   warm, no rashes, no ecchymosis  Extremities: normal, no cyanosis, clubbing.  Other findings:    LABORATORY PANEL:   CBC No results for input(s): WBC, HGB, HCT, PLT in the last 168 hours. ------------------------------------------------------------------------------------------------------------------  Chemistries  No results for input(s): NA, K, CL, CO2, GLUCOSE, BUN, CREATININE, CALCIUM, MG, AST, ALT, ALKPHOS, BILITOT in the last 168 hours.  Invalid input(s): GFRCGP ------------------------------------------------------------------------------------------------------------------  Cardiac Enzymes No results for input(s): TROPONINI in the last 168 hours. ------------------------------------------------------------  RADIOLOGY:  No results found.     Thank  you for the consultation and for allowing [facility] to assist in the care of your patient. Our recommendations are noted above.  Please contact us if we can be of further service.   Marda Stalker, MD.  Board Certified in Internal Medicine, Pulmonary Medicine, [place], and Sleep Medicine.  [facility] Office Number: [phone]  Patricia Pesa, M.D.  Merton Border, M.D  03/04/2017

## 2017-03-04 NOTE — Telephone Encounter (Signed)
Called pharmacy and spoke with the pharmacist, she stated that there is no record of this Rx on file.

## 2017-03-06 ENCOUNTER — Ambulatory Visit: Payer: Medicare Other | Admitting: Family Medicine

## 2017-03-20 ENCOUNTER — Telehealth: Payer: Self-pay | Admitting: Internal Medicine

## 2017-03-20 NOTE — Telephone Encounter (Signed)
Patient is calling to check on status of when it may be possible for him to do a home sleep study  Please call to discuss

## 2017-03-20 NOTE — Telephone Encounter (Signed)
Pt returned call and he has been scheduled to p/u HST device on Friday 04/03/17 between 3:30-4:30 pm. Pt is aware. Nothing else needed at this time. Rhonda J Cobb

## 2017-03-20 NOTE — Telephone Encounter (Signed)
ATC patient. No answer. LMOAM for pt to return my call to schedule HST.Rhonda J Cobb

## 2017-03-20 NOTE — Telephone Encounter (Signed)
Please contact pt about message below.

## 2017-03-25 DIAGNOSIS — G4733 Obstructive sleep apnea (adult) (pediatric): Secondary | ICD-10-CM | POA: Diagnosis not present

## 2017-04-03 ENCOUNTER — Ambulatory Visit (INDEPENDENT_AMBULATORY_CARE_PROVIDER_SITE_OTHER): Payer: Medicare Other | Admitting: Family Medicine

## 2017-04-03 ENCOUNTER — Encounter: Payer: Self-pay | Admitting: Family Medicine

## 2017-04-03 VITALS — BP 144/86 | HR 67 | Temp 97.8°F | Wt 234.0 lb

## 2017-04-03 DIAGNOSIS — I10 Essential (primary) hypertension: Secondary | ICD-10-CM | POA: Diagnosis not present

## 2017-04-03 DIAGNOSIS — I2510 Atherosclerotic heart disease of native coronary artery without angina pectoris: Secondary | ICD-10-CM

## 2017-04-03 DIAGNOSIS — M546 Pain in thoracic spine: Secondary | ICD-10-CM

## 2017-04-03 LAB — BASIC METABOLIC PANEL
BUN: 15 mg/dL (ref 6–23)
CALCIUM: 8.8 mg/dL (ref 8.4–10.5)
CO2: 27 mEq/L (ref 19–32)
Chloride: 103 mEq/L (ref 96–112)
Creatinine, Ser: 0.99 mg/dL (ref 0.40–1.50)
GFR: 80.48 mL/min (ref >60.00)
Glucose, Bld: 117 mg/dL — ABNORMAL HIGH (ref 70–99)
Potassium: 4.1 mEq/L (ref 3.5–5.1)
SODIUM: 138 meq/L (ref 135–145)

## 2017-04-03 NOTE — Progress Notes (Signed)
   Subjective:    Patient ID: Keith Murillo, male    DOB: March 17, 1950, 67 y.o.   MRN: [id number]  HPI This is a 67 yo male who presents today for follow up of HTN. Started lisinopril last month, no side effects.   OSA- saw Dr. Ashby Dawes and will be doing a home sleep study this weekend.   Right sided thoracic pain- Has had some right sided mid back pain under shoulder blade for several weeks. Started after he lifted a heavy carpet. Pain intermittent, relief with heat/ice. Has been getting better, was sharp, no achy. Doesn't notice it if he is busy, more if he is sitting still. Occasional radiation down arm. Rarely takes acetaminophen for pain.   Past Medical History:  Diagnosis Date  . Chest pain   . Chronic anticoagulation   . Heart attack ([place])   . Hypercholesterolemia   . Hyperlipidemia   . Ischemic heart disease September 2004   Known--with non-Q-wave myocardial infarction   . Obesity   . Sleep apnea    Past Surgical History:  Procedure Laterality Date  . CARDIAC CATHETERIZATION  11/2007   Family History  Problem Relation Age of Onset  . Cancer Mother   . Hearing loss Father   . Cancer Maternal Grandmother   . Parkinson's disease Maternal Grandfather   . Stroke Maternal Grandfather   . Stroke Paternal Grandmother   . Heart attack Paternal Grandfather    Social History   Tobacco Use  . Smoking status: Never Smoker  . Smokeless tobacco: Never Used  Substance Use Topics  . Alcohol use: Yes    Comment: occas  . Drug use: No      Review of Systems Per HPI    Objective:   Physical Exam  Constitutional: He is oriented to person, place, and time. He appears well-developed and well-nourished. No distress.  HENT:  Head: Normocephalic and atraumatic.  Eyes: Conjunctivae are normal.  Cardiovascular: Normal rate, regular rhythm and normal heart sounds.  Pulmonary/Chest: Effort normal and breath sounds normal.  Musculoskeletal: He exhibits no edema.   Cervical back: He exhibits normal range of motion, no tenderness and no bony tenderness.       Thoracic back: He exhibits tenderness (under right scapula). He exhibits normal range of motion and no bony tenderness.  Neurological: He is alert and oriented to person, place, and time.  Skin: Skin is warm and dry. He is not diaphoretic.  Vitals reviewed.     BP (!) 144/86   Pulse 67   Temp 97.8 F (36.6 C) (Oral)   Wt 234 lb (106.1 kg)   SpO2 97%   BMI 36.38 kg/m  Wt Readings from Last 3 Encounters:  04/03/17 234 lb (106.1 kg)  03/04/17 233 lb (105.7 kg)  03/04/17 232 lb 8 oz (105.5 kg)   BP Readings from Last 3 Encounters:  04/03/17 (!) 144/86  03/04/17 (!) 142/82  03/04/17 (!) 158/100       Assessment & Plan:  1. Essential hypertension - much better on lisinopril, he will continue to check periodic home readings - Basic Metabolic Panel - follow up in 6 months  2. Acute right-sided thoracic back pain - improving, continue ROM, heat/ice, OTC analgesics PRN   Clarene Reamer, FNP-BC  [facility]  04/03/2017 8:35 AM

## 2017-04-03 NOTE — Patient Instructions (Signed)
Good to see you today!  Your blood pressure looks good.   Please follow up in 6 months

## 2017-04-04 ENCOUNTER — Encounter: Payer: Self-pay | Admitting: Internal Medicine

## 2017-04-04 DIAGNOSIS — G4719 Other hypersomnia: Secondary | ICD-10-CM

## 2017-04-09 ENCOUNTER — Telehealth: Payer: Self-pay | Admitting: *Deleted

## 2017-04-09 DIAGNOSIS — G4733 Obstructive sleep apnea (adult) (pediatric): Secondary | ICD-10-CM

## 2017-04-09 NOTE — Telephone Encounter (Signed)
Pt informed of sleep study results. CPAP titration ordered. Nothing further needed.

## 2017-04-10 ENCOUNTER — Other Ambulatory Visit: Payer: Medicare Other

## 2017-04-10 DIAGNOSIS — N401 Enlarged prostate with lower urinary tract symptoms: Secondary | ICD-10-CM | POA: Diagnosis not present

## 2017-04-10 DIAGNOSIS — E291 Testicular hypofunction: Secondary | ICD-10-CM | POA: Diagnosis not present

## 2017-04-11 LAB — HEMATOCRIT: Hematocrit: 52.8 % — ABNORMAL HIGH (ref 37.5–51.0)

## 2017-04-11 LAB — PSA: PROSTATE SPECIFIC AG, SERUM: 3.9 ng/mL (ref 0.0–4.0)

## 2017-04-11 LAB — TESTOSTERONE: TESTOSTERONE: 662 ng/dL (ref 264–916)

## 2017-04-15 ENCOUNTER — Ambulatory Visit (INDEPENDENT_AMBULATORY_CARE_PROVIDER_SITE_OTHER): Payer: Medicare Other | Admitting: Urology

## 2017-04-15 ENCOUNTER — Encounter: Payer: Self-pay | Admitting: Urology

## 2017-04-15 VITALS — BP 161/88 | HR 66 | Ht 68.0 in | Wt 233.0 lb

## 2017-04-15 DIAGNOSIS — E291 Testicular hypofunction: Secondary | ICD-10-CM | POA: Diagnosis not present

## 2017-04-15 NOTE — Progress Notes (Signed)
04/15/2017 10:46 AM   B[facility] May 05, 1950 [id number]  Referring provider: [name], [place], [place] [address]  Chief Complaint  Patient presents with  . Hypogonadism    HPI: 67 year old male presents for follow-up of hypogonadism.  He had previously been on TRT several years ago in [place].  He had a low normal LH and initially tried clomiphene.  He had improvement in his testosterone levels but no symptomatic improvement.  He was subsequently placed back on IM injections at 100 mg weekly.  He has noted significant improvement in his energy level and feels he has increased muscle mass.  He continues with the ED and is taking sildenafil with good results.  He has no bothersome lower urinary tract symptoms.  Denies breast tenderness/enlargement or lower extremity edema.  He had labs earlier this month and his testosterone level looks good at 662.  Hematocrit was slightly increased at 52.8 and PSA was 3.9 which was up from 3.04 in November 2018.   PMH: Past Medical History:  Diagnosis Date  . Chest pain   . Heart attack ([place])   . Hypercholesterolemia   . Hyperlipidemia   . Ischemic heart disease September 2004   Known--with non-Q-wave myocardial infarction   . Obesity   . Sleep apnea     Surgical History: Past Surgical History:  Procedure Laterality Date  . CARDIAC CATHETERIZATION  11/2007    Home Medications:  Allergies as of 04/15/2017   No Known Allergies     Medication List        Accurate as of 04/15/17 10:46 AM. Always use your most recent med list.          aspirin 325 MG tablet Take 325 mg by mouth daily.   AZO-CRANBERRY PO Take by mouth.   B COMPLEX PO Take by mouth daily.   Biotin 5000 MCG Caps Take by mouth.   DHEA 25 MG Caps Take by mouth.   GLUCOSAMINE HCL PO Take by mouth.   GRAPE SEED PO Take by mouth daily.   L-Arginine 1000 MG Tabs Take by mouth.   lisinopril 10 MG tablet Commonly  known as:  PRINIVIL,ZESTRIL Take 1 tablet (10 mg total) by mouth daily.   sildenafil 20 MG tablet Commonly known as:  REVATIO 2-5 tabs 1 hour prior to intercourse   testosterone cypionate 200 MG/ML injection Commonly known as:  DEPOTESTOSTERONE CYPIONATE Inject 0.5 mLs (100 mg total) into the muscle once a week.   vitamin C 500 MG tablet Commonly known as:  ASCORBIC ACID Take 500 mg by mouth daily.       Allergies: No Known Allergies  Family History: Family History  Problem Relation Age of Onset  . Cancer Mother   . Hearing loss Father   . Cancer Maternal Grandmother   . Parkinson's disease Maternal Grandfather   . Stroke Maternal Grandfather   . Stroke Paternal Grandmother   . Heart attack Paternal Grandfather     Social History:  reports that he has never smoked. He has never used smokeless tobacco. He reports that he drinks alcohol. He reports that he does not use drugs.  ROS: UROLOGY Frequent Urination?: No Hard to postpone urination?: No Burning/pain with urination?: No Get up at night to urinate?: No Leakage of urine?: No Urine stream starts and stops?: No Trouble starting stream?: No Do you have to strain to urinate?: No Blood in urine?: No Urinary tract infection?: No Sexually transmitted disease?: No  Injury to kidneys or bladder?: No Painful intercourse?: No Weak stream?: No Erection problems?: Yes Penile pain?: No  Gastrointestinal Nausea?: No Vomiting?: No Indigestion/heartburn?: No Diarrhea?: No Constipation?: No  Constitutional Fever: No Night sweats?: Yes Weight loss?: No Fatigue?: Yes  Skin Skin rash/lesions?: No Itching?: No  Eyes Blurred vision?: No Double vision?: No  Ears/Nose/Throat Sore throat?: No Sinus problems?: No  Hematologic/Lymphatic Swollen glands?: No Easy bruising?: No  Cardiovascular Leg swelling?: No Chest pain?: No  Respiratory Cough?: No Shortness of breath?: No  Endocrine Excessive thirst?:  No  Musculoskeletal Back pain?: Yes Joint pain?: No  Neurological Headaches?: No Dizziness?: No  Psychologic Depression?: No Anxiety?: No  Physical Exam: BP (!) 161/88   Pulse 66   Ht 5\' 8"  (1.727 m)   Wt 233 lb (105.7 kg)   BMI 35.43 kg/m   Constitutional:  Alert and oriented, No acute distress. HEENT: [place] AT, moist mucus membranes.  Trachea midline, no masses. Cardiovascular: No clubbing, cyanosis, or edema. Respiratory: Normal respiratory effort, no increased work of breathing. GI: Abdomen is soft, nontender, nondistended, no abdominal masses GU: No CVA tenderness.  Prostate 40 g, smooth without nodules Lymph: No cervical or inguinal lymphadenopathy. Skin: No rashes, bruises or suspicious lesions. Neurologic: Grossly intact, no focal deficits, moving all 4 extremities. Psychiatric: Normal mood and affect.  Laboratory Data: Lab Results  Component Value Date   WBC 7.6 10/31/2016   HGB 15.8 10/31/2016   HCT 52.8 (H) 04/10/2017   MCV 88.1 10/31/2016   PLT 316.0 10/31/2016    Lab Results  Component Value Date   CREATININE 0.99 04/03/2017    Lab Results  Component Value Date   PSA 3.04 11/21/2016    Lab Results  Component Value Date   TESTOSTERONE 662 04/10/2017    Lab Results  Component Value Date   HGBA1C 5.4 10/31/2016    Assessment & Plan:   He has slight increase in his hematocrit and recommended he donate blood.  His PSA has also increased.  Will recheck this in approximately 6 weeks and if it remains elevated above baseline he will need further evaluation before continuing testosterone.   Return in about 6 months (around 10/15/2017) for Recheck, labs.  Abbie Sons, [place] [address] [place], [place] [address] [phone]

## 2017-04-17 ENCOUNTER — Ambulatory Visit: Payer: Self-pay | Admitting: Urology

## 2017-04-22 ENCOUNTER — Ambulatory Visit: Payer: Medicare Other | Attending: Internal Medicine

## 2017-04-22 DIAGNOSIS — G4733 Obstructive sleep apnea (adult) (pediatric): Secondary | ICD-10-CM | POA: Diagnosis not present

## 2017-04-23 ENCOUNTER — Telehealth: Payer: Self-pay | Admitting: *Deleted

## 2017-04-23 DIAGNOSIS — G4733 Obstructive sleep apnea (adult) (pediatric): Secondary | ICD-10-CM | POA: Diagnosis not present

## 2017-04-23 NOTE — Telephone Encounter (Signed)
Pt aware. Orders placed. 

## 2017-04-27 ENCOUNTER — Telehealth: Payer: Self-pay | Admitting: Internal Medicine

## 2017-04-27 NOTE — Telephone Encounter (Signed)
Keith Murillo states he will need copy of sleep study. He has copy of titration. Will find HST and fax to [phone].

## 2017-04-27 NOTE — Telephone Encounter (Signed)
HST faxed to [facility]. Nothing further needed.

## 2017-04-27 NOTE — Telephone Encounter (Signed)
Please call regarding pt sleep study.

## 2017-05-04 ENCOUNTER — Telehealth: Payer: Self-pay | Admitting: Urology

## 2017-05-04 NOTE — Telephone Encounter (Signed)
Pt needs a refill on testosterone.

## 2017-05-05 MED ORDER — TESTOSTERONE CYPIONATE 200 MG/ML IM SOLN: 100.0000 mg | INTRAMUSCULAR | 0 refills | Status: DC

## 2017-05-05 NOTE — Telephone Encounter (Signed)
Limited Rx was sent.  He needs a PSA and hematocrit in late May 2019

## 2017-05-06 NOTE — Telephone Encounter (Signed)
Pt informed, he has lab appointment for 05/29/17.

## 2017-05-26 ENCOUNTER — Other Ambulatory Visit: Payer: Self-pay | Admitting: Family Medicine

## 2017-05-26 DIAGNOSIS — N401 Enlarged prostate with lower urinary tract symptoms: Secondary | ICD-10-CM

## 2017-05-26 DIAGNOSIS — E291 Testicular hypofunction: Secondary | ICD-10-CM

## 2017-05-28 NOTE — Progress Notes (Signed)
[facility] Consultation      Assessment and Plan:  Obstructive sleep apnea with excessive daytime sleepiness. -Positive HST on 04/04/2017.  CPAP titration study on 04/22/2017, started on auto CPAP 5-12. -Doing very well on CPAP, continue use every night. -Discussed improving sleep hygiene, when he feels sleepy on the couch try to go to bed immediately.   Essential hypertension. - Sleep apnea can contribute to elevated blood pressure, therefore treatment of sleep apnea is important part of treatment of the patient's blood pressure.   Date: 05/28/2017  MRN# [id number] Keith Murillo 1950/12/11   Keith Murillo is a 67 y.o. old male seen in consultation for chief complaint of:    Chief Complaint  Patient presents with  . Follow-up    Sleep Apnea-DME [facility]. Pt wears CPAP at least 70% of the time; feels pressure works well for him and no new supplies needed at this time. DL attachec.    HPI:   The patient is a 67 year old male, he was sent for a home sleep test on 04/04/2017 which showed severe complex sleep apnea.  He was subsequently sent for a CPAP titration test on 4/17, showed good control of obstructive sleep apnea pressures of 5 and above.  He was started on auto CPAP with pressure range of 05-18-10 cm water.  He was previously diagnosed with OSA in 2005 but stopped using it after weight loss.  Then more recently started to become sleepy again.  He has been using CPAP every night, he is more awake during the day, he is no longer sleepy. He is no longer snoring.  He sometimes takes melatonin to fall asleep.  He often falls asleep on the couch for about 30 minutes before going to bed.  **Review of download data, raw data personally reviewed, usage greater than 4 hours is 20/21 days.  Average usage on days used is 6 hours 50 minutes.  Pressure setting is 5-12.  Median pressure 7, 95th percentile pressure 9, maximum pressure 10.  Residual AHI 1.7.  Overall this  shows good compliance with excellent control of obstructive sleep apnea when CPAP is used. **Home sleep test 04/04/2017, severe sleep apnea AHI of 61, central apnea index of 6.5 consistent with mixed complex sleep apnea.  AHI was as high as 127.8 in the supine position.    Medication:    Current Outpatient Medications:  .  aspirin 325 MG tablet, Take 325 mg by mouth daily., Disp: , Rfl:  .  AZO-CRANBERRY PO, Take by mouth., Disp: , Rfl:  .  B Complex Vitamins (B COMPLEX PO), Take by mouth daily., Disp: , Rfl:  .  Bioflavonoid Products (GRAPE SEED PO), Take by mouth daily., Disp: , Rfl:  .  Biotin 5000 MCG CAPS, Take by mouth., Disp: , Rfl:  .  DHEA 25 MG CAPS, Take by mouth., Disp: , Rfl:  .  GLUCOSAMINE HCL PO, Take by mouth., Disp: , Rfl:  .  L-Arginine 1000 MG TABS, Take by mouth., Disp: , Rfl:  .  lisinopril (PRINIVIL,ZESTRIL) 10 MG tablet, Take 1 tablet (10 mg total) by mouth daily., Disp: 30 tablet, Rfl: 3 .  sildenafil (REVATIO) 20 MG tablet, 2-5 tabs 1 hour prior to intercourse, Disp: 90 tablet, Rfl: 1 .  testosterone cypionate (DEPOTESTOSTERONE CYPIONATE) 200 MG/ML injection, Inject 0.5 mLs (100 mg total) into the muscle once a week., Disp: 2 mL, Rfl: 0 .  vitamin C (ASCORBIC ACID) 500 MG tablet, Take 500 mg by mouth  daily., Disp: , Rfl:    Allergies:  Patient has no known allergies.  Review of Systems: Gen:  Denies  fever, sweats, chills HEENT: Denies blurred vision, double vision. bleeds, sore throat Cvc:  No dizziness, chest pain. Resp:   Denies cough or sputum production, shortness of breath Gi: Denies swallowing difficulty, stomach pain. Gu:  Denies bladder incontinence, burning urine Ext:   No Joint pain, stiffness. Skin: No skin rash,  hives  Endoc:  No polyuria, polydipsia. Psych: No depression, insomnia. Other:  All other systems were reviewed with the patient and were negative other that what is mentioned in the HPI.   Physical Examination:   VS: BP 118/72  (BP Location: Left Arm, Cuff Size: Normal)   Pulse 65   Ht 5\' 6"  (1.676 m)   Wt 235 lb (106.6 kg)   SpO2 95%   BMI 37.93 kg/m   General Appearance: No distress  Neuro:without focal findings,  speech normal,  HEENT: PERRLA, EOM intact.   Pulmonary: normal breath sounds, No wheezing.  CardiovascularNormal S1,S2.  No m/r/g.   Abdomen: Benign, Soft, non-tender. Renal:  No costovertebral tenderness  GU:  No performed at this time. Endoc: No evident thyromegaly, no signs of acromegaly. Skin:   warm, no rashes, no ecchymosis  Extremities: normal, no cyanosis, clubbing.  Other findings:    LABORATORY PANEL:   CBC No results for input(s): WBC, HGB, HCT, PLT in the last 168 hours. ------------------------------------------------------------------------------------------------------------------  Chemistries  No results for input(s): NA, K, CL, CO2, GLUCOSE, BUN, CREATININE, CALCIUM, MG, AST, ALT, ALKPHOS, BILITOT in the last 168 hours.  Invalid input(s): GFRCGP ------------------------------------------------------------------------------------------------------------------  Cardiac Enzymes No results for input(s): TROPONINI in the last 168 hours. ------------------------------------------------------------  RADIOLOGY:  No results found.     Thank  you for the consultation and for allowing [facility] to assist in the care of your patient. Our recommendations are noted above.  Please contact us if we can be of further service.   Marda Stalker, MD.  Board Certified in Internal Medicine, Pulmonary Medicine, [place], and Sleep Medicine.  [facility] Office Number: [phone]  Patricia Pesa, M.D.  Merton Border, M.D  05/28/2017

## 2017-05-29 ENCOUNTER — Encounter: Payer: Self-pay | Admitting: Internal Medicine

## 2017-05-29 ENCOUNTER — Other Ambulatory Visit: Payer: Medicare Other

## 2017-05-29 ENCOUNTER — Ambulatory Visit (INDEPENDENT_AMBULATORY_CARE_PROVIDER_SITE_OTHER): Payer: Medicare Other | Admitting: Internal Medicine

## 2017-05-29 VITALS — BP 118/72 | HR 65 | Ht 66.0 in | Wt 235.0 lb

## 2017-05-29 DIAGNOSIS — E291 Testicular hypofunction: Secondary | ICD-10-CM

## 2017-05-29 DIAGNOSIS — G4733 Obstructive sleep apnea (adult) (pediatric): Secondary | ICD-10-CM | POA: Diagnosis not present

## 2017-05-29 DIAGNOSIS — N401 Enlarged prostate with lower urinary tract symptoms: Secondary | ICD-10-CM | POA: Diagnosis not present

## 2017-05-29 DIAGNOSIS — I2510 Atherosclerotic heart disease of native coronary artery without angina pectoris: Secondary | ICD-10-CM

## 2017-05-29 NOTE — Patient Instructions (Addendum)
--  Continue to use CPAP every night, for the whole night.  --Try to rinse mask and tube about once per week.

## 2017-05-30 LAB — HEMATOCRIT: Hematocrit: 48.5 % (ref 37.5–51.0)

## 2017-05-30 LAB — PSA: PROSTATE SPECIFIC AG, SERUM: 4.6 ng/mL — AB (ref 0.0–4.0)

## 2017-06-03 ENCOUNTER — Telehealth: Payer: Self-pay

## 2017-06-03 NOTE — Telephone Encounter (Signed)
Pt informed. He agrees to do Prostate Biopsy. He is currently taking Aspirin daily, I have faxed clearance to stop Aspirin 7 days prior to his pcp Clarene Reamer, FNP.  Please schedule once clearance received.

## 2017-06-03 NOTE — Telephone Encounter (Signed)
-----   Message from Abbie Sons, MD sent at 06/02/2017 11:39 AM EDT ----- PSA has increased to 4.6.  He will need a prostate biopsy if he desires to continue testosterone replacement.

## 2017-06-09 ENCOUNTER — Telehealth: Payer: Self-pay | Admitting: Urology

## 2017-06-09 NOTE — Telephone Encounter (Signed)
apps made and LM for pt to cb to confirm   Will mail instructions to patient once I have spoken to him and gone over them with him   Sharyn Lull

## 2017-06-10 ENCOUNTER — Other Ambulatory Visit: Payer: Medicare Other

## 2017-07-07 ENCOUNTER — Encounter (HOSPITAL_COMMUNITY): Payer: Self-pay | Admitting: Emergency Medicine

## 2017-07-07 ENCOUNTER — Emergency Department (HOSPITAL_COMMUNITY)
Admission: EM | Admit: 2017-07-07 | Discharge: 2017-07-08 | Disposition: A | Discharge status: Home / Self Care | Payer: Medicare Other | Attending: Emergency Medicine | Admitting: Emergency Medicine

## 2017-07-07 DIAGNOSIS — S91002A Unspecified open wound, left ankle, initial encounter: Secondary | ICD-10-CM | POA: Diagnosis present

## 2017-07-07 DIAGNOSIS — I83899 Varicose veins of unspecified lower extremities with other complications: Secondary | ICD-10-CM

## 2017-07-07 DIAGNOSIS — Z7982 Long term (current) use of aspirin: Secondary | ICD-10-CM | POA: Insufficient documentation

## 2017-07-07 DIAGNOSIS — Z79899 Other long term (current) drug therapy: Secondary | ICD-10-CM | POA: Diagnosis not present

## 2017-07-07 DIAGNOSIS — X58XXXA Exposure to other specified factors, initial encounter: Secondary | ICD-10-CM | POA: Diagnosis not present

## 2017-07-07 DIAGNOSIS — Y929 Unspecified place or not applicable: Secondary | ICD-10-CM | POA: Insufficient documentation

## 2017-07-07 DIAGNOSIS — I2510 Atherosclerotic heart disease of native coronary artery without angina pectoris: Secondary | ICD-10-CM | POA: Insufficient documentation

## 2017-07-07 DIAGNOSIS — Y939 Activity, unspecified: Secondary | ICD-10-CM | POA: Diagnosis not present

## 2017-07-07 DIAGNOSIS — I83812 Varicose veins of left lower extremities with pain: Secondary | ICD-10-CM | POA: Diagnosis not present

## 2017-07-07 DIAGNOSIS — Y999 Unspecified external cause status: Secondary | ICD-10-CM | POA: Diagnosis not present

## 2017-07-07 DIAGNOSIS — I83892 Varicose veins of left lower extremities with other complications: Secondary | ICD-10-CM | POA: Insufficient documentation

## 2017-07-07 NOTE — ED Triage Notes (Addendum)
Pt reports one of his varicose veins to L foot popped and was "squiritng" blood. No active bleeding in triage. Denies thinners.

## 2017-07-08 NOTE — Discharge Instructions (Addendum)
You were seen in the ER for bleeding from varicose vein.   Bleeding stopped after elevation and pressure dressing.  Keep your foot elevated and pressure dressing on for the next 2 to 4 hours.  If you notice blood seeping through, you can apply another pressure dressing, do not take the original dressing off as this will dislodge the clots that are forming.  Return to the ER for profuse, persistent bleeding.

## 2017-07-08 NOTE — ED Provider Notes (Signed)
[facility] EMERGENCY DEPARTMENT Provider Note   CSN: [id number] Arrival date & time: 07/07/17  2306     History   Chief Complaint Chief Complaint  Patient presents with  . Varicose Veins    HPI Keith Murillo is a 67 y.o. male with history as documented below is here for evaluation of bleeding from varicose vein to the left medial aspect of his ankle.  Sudden onset today.  States that he has had a small purple "bump" to this area for quite some time, thinks that he may have nicked it with something and cause it to bleed.  Wife put gauze and tape around it but bleeding would not stop.  States it was "squirting out".  While in the waiting room, bleeding started to slow down.  He denies distal coldness, numbness, paresthesias.  No anticoagulants.   HPI  Past Medical History:  Diagnosis Date  . Chest pain   . Heart attack ([place])   . Hypercholesterolemia   . Hyperlipidemia   . Ischemic heart disease September 2004   Known--with non-Q-wave myocardial infarction   . Obesity   . Sleep apnea     Patient Active Problem List   Diagnosis Date Noted  . Coronary artery disease involving native heart without angina pectoris 01/30/2017  . Hypogonadism in male 12/05/2016    Past Surgical History:  Procedure Laterality Date  . CARDIAC CATHETERIZATION  11/2007        Home Medications    Prior to Admission medications   Medication Sig Start Date End Date Taking? Authorizing Provider  aspirin 325 MG tablet Take 325 mg by mouth daily.    [provider]  AZO-CRANBERRY PO Take by mouth.    [provider]  B Complex Vitamins (B COMPLEX PO) Take by mouth daily.    [provider]  Bioflavonoid Products (GRAPE SEED PO) Take by mouth daily.    [provider]  Biotin 5000 MCG CAPS Take by mouth.    [provider]  DHEA 25 MG CAPS Take by mouth.    [provider]  GLUCOSAMINE HCL PO Take by mouth.    [provider]  L-Arginine 1000 MG TABS Take by mouth.    [provider]  lisinopril (PRINIVIL,ZESTRIL) 10 MG tablet Take 1 tablet (10 mg total) by mouth daily. 03/04/17   [name], FNP  sildenafil (REVATIO) 20 MG tablet 2-5 tabs 1 hour prior to intercourse 02/18/17   Stoioff, Ronda Fairly, MD  testosterone cypionate (DEPOTESTOSTERONE CYPIONATE) 200 MG/ML injection Inject 0.5 mLs (100 mg total) into the muscle once a week. 05/05/17   Stoioff, Ronda Fairly, MD  vitamin C (ASCORBIC ACID) 500 MG tablet Take 500 mg by mouth daily.    [provider]    Family History Family History  Problem Relation Age of Onset  . Cancer Mother   . Hearing loss Father   . Cancer Maternal Grandmother   . Parkinson's disease Maternal Grandfather   . Stroke Maternal Grandfather   . Stroke Paternal Grandmother   . Heart attack Paternal Grandfather     Social History Social History   Tobacco Use  . Smoking status: Never Smoker  . Smokeless tobacco: Never Used  Substance Use Topics  . Alcohol use: Yes    Comment: occas  . Drug use: No     Allergies   Patient has no known allergies.   Review of Systems Review of Systems  Skin: Positive  for wound.  All other systems reviewed and are negative.    Physical Exam Updated Vital Signs BP (!) 142/90 (BP Location: Left Arm)   Pulse 65   Temp 98.5 F (36.9 C) (Oral)   Resp 20   SpO2 100%   Physical Exam  Constitutional: He is oriented to person, place, and time. He appears well-developed and well-nourished.  Non-toxic appearance.  HENT:  Head: Normocephalic.  Right Ear: External ear normal.  Left Ear: External ear normal.  Nose: Nose normal.  Eyes: Conjunctivae and EOM are normal.  Neck: Full passive range of motion without pain.  Cardiovascular: Normal rate.  Multiple, enlarged varicose veins to the medial aspect of bilateral calves.  No chronic venous stasis changes to skin in lower extremities. 2+ Dp and PT pulses  bilaterally.   Pulmonary/Chest: Effort normal. No tachypnea. No respiratory distress.  Musculoskeletal: Normal range of motion.  Neurological: He is alert and oriented to person, place, and time.  Sensation to light touch intact in lower extremities   Skin: Skin is warm and dry. Capillary refill takes less than 2 seconds.  Approximately 1 mm circular wound to the medial aspect of the left ankle, oozing dark blood slowly.  Using completely stopped with elevation of the foot.  Psychiatric: His behavior is normal. Thought content normal.     ED Treatments / Results  Labs (all labs ordered are listed, but only abnormal results are displayed) Labs Reviewed - No data to display  EKG None  Radiology No results found.  Procedures Procedures (including critical care time)  Medications Ordered in ED Medications - No data to display   Initial Impression / Assessment and Plan / ED Course  I have reviewed the triage vital signs and the nursing notes.  Pertinent labs & imaging results that were available during my care of the patient were reviewed by me and considered in my medical decision making (see chart for details).    Patient here with bleeding from varicose vein.  Bleeding controlled with pressure dressing in the ER.  He was observed for 30 minutes and attempted to ambulate and there was no recurrence of bleed.  Extremities neurovascularly intact otherwise.  No anticoagulants.  Will defer to primary care doctor for further evaluation of varicose veins and likely chronic venous stasis.  Final Clinical Impressions(s) / ED Diagnoses   Final diagnoses:  Bleeding from varicose vein    ED Discharge Orders    None       Kinnie Feil, PA-C 07/08/17 0101    Pattricia Boss, MD 07/08/17 1454

## 2017-07-13 ENCOUNTER — Other Ambulatory Visit: Payer: Self-pay | Admitting: Family Medicine

## 2017-07-13 ENCOUNTER — Telehealth: Payer: Self-pay | Admitting: *Deleted

## 2017-07-13 DIAGNOSIS — I83899 Varicose veins of unspecified lower extremities with other complications: Secondary | ICD-10-CM

## 2017-07-13 DIAGNOSIS — I8390 Asymptomatic varicose veins of unspecified lower extremity: Secondary | ICD-10-CM

## 2017-07-13 NOTE — Telephone Encounter (Signed)
Copied from [place] [id number]. Topic: Referral - Request >> Jul 13, 2017  8:40 AM Synthia Innocent wrote: Reason for CRM: Requesting to referral to vein specialists for varicose vein. Was seen in ER at [facility] regarding this.

## 2017-07-13 NOTE — Progress Notes (Signed)
Patient requests referral to vein specialist, recently seen in ER with bleeding varicose veins.

## 2017-07-13 NOTE — Telephone Encounter (Signed)
Please call patient and let him know that referral has been placed.

## 2017-07-13 NOTE — Telephone Encounter (Signed)
Called and spoke with patient making him aware. Understanding verbalized nothing further needed at this time.

## 2017-07-14 ENCOUNTER — Other Ambulatory Visit: Payer: Self-pay | Admitting: Family Medicine

## 2017-07-14 DIAGNOSIS — I10 Essential (primary) hypertension: Secondary | ICD-10-CM

## 2017-07-17 ENCOUNTER — Ambulatory Visit (INDEPENDENT_AMBULATORY_CARE_PROVIDER_SITE_OTHER): Payer: Medicare Other | Admitting: Vascular Surgery

## 2017-07-17 ENCOUNTER — Encounter (INDEPENDENT_AMBULATORY_CARE_PROVIDER_SITE_OTHER): Payer: Self-pay | Admitting: Vascular Surgery

## 2017-07-17 VITALS — BP 144/80 | HR 67 | Resp 17 | Ht 66.0 in | Wt 221.4 lb

## 2017-07-17 DIAGNOSIS — I83893 Varicose veins of bilateral lower extremities with other complications: Secondary | ICD-10-CM | POA: Diagnosis not present

## 2017-07-17 DIAGNOSIS — I2510 Atherosclerotic heart disease of native coronary artery without angina pectoris: Secondary | ICD-10-CM

## 2017-07-17 DIAGNOSIS — E785 Hyperlipidemia, unspecified: Secondary | ICD-10-CM | POA: Diagnosis not present

## 2017-07-17 NOTE — Assessment & Plan Note (Signed)
lipid control important in reducing the progression of atherosclerotic disease.   

## 2017-07-17 NOTE — Patient Instructions (Signed)

## 2017-07-17 NOTE — Progress Notes (Signed)
Patient ID: Keith Murillo, male   DOB: August 06, 1950, 67 y.o.   MRN: [id number]  Chief Complaint  Patient presents with  . New Patient (Initial Visit)    varicose veins    HPI Keith Murillo is a 67 y.o. male.  I am asked to see the patient by D. Gessenger, FNP for evaluation of varicose vein with bleeding.  The patient presents with complaints of symptomatic varicosities of the lower extremities. The patient reports a long standing history of varicosities and they have become painful over time. There was no clear inciting event or causative factor that started the symptoms.  A few weeks ago getting out of the bath, his left ankle varicosities bled dramatically requiring an extended amount of time to get the bleeding stopped.  He still has scab overlying the varicosity which bled a few weeks ago.  The left leg is more severly affected. The patient elevates the legs for relief. The pain is described as heaviness and tiredness in the legs. The symptoms are generally most severe in the evening, particularly when they have been on their feet for long periods of time.  Elevation has been used to try to improve the symptoms with limited success. The patient complains of intermittent swelling as an associated symptom. The patient has no previous history of deep venous thrombosis or superficial thrombophlebitis to their knowledge.     Past Medical History:  Diagnosis Date  . Chest pain   . Heart attack ([place])   . Hypercholesterolemia   . Hyperlipidemia   . Ischemic heart disease September 2004   Known--with non-Q-wave myocardial infarction   . Obesity   . Sleep apnea     Past Surgical History:  Procedure Laterality Date  . CARDIAC CATHETERIZATION  11/2007    Family History  Problem Relation Age of Onset  . Cancer Mother   . Hearing loss Father   . Cancer Maternal Grandmother   . Parkinson's disease Maternal Grandfather   . Stroke Maternal Grandfather   . Stroke Paternal  Grandmother   . Heart attack Paternal Grandfather   Father has significant varicose veins  Social History Social History   Tobacco Use  . Smoking status: Never Smoker  . Smokeless tobacco: Never Used  Substance Use Topics  . Alcohol use: Yes    Comment: occas  . Drug use: No     No Known Allergies  Current Outpatient Medications  Medication Sig Dispense Refill  . aspirin 325 MG tablet Take 325 mg by mouth daily.    . AZO-CRANBERRY PO Take by mouth.    . B Complex Vitamins (B COMPLEX PO) Take by mouth daily.    Marland Kitchen Bioflavonoid Products (GRAPE SEED PO) Take by mouth daily.    . Biotin 5000 MCG CAPS Take by mouth.    Marland Kitchen DHEA 25 MG CAPS Take by mouth.    Marland Kitchen GLUCOSAMINE HCL PO Take by mouth.    Marland Kitchen L-Arginine 1000 MG TABS Take by mouth.    Marland Kitchen lisinopril (PRINIVIL,ZESTRIL) 10 MG tablet TAKE ONE TABLET BY MOUTH ONE TIME DAILY  30 tablet 2  . sildenafil (REVATIO) 20 MG tablet 2-5 tabs 1 hour prior to intercourse 90 tablet 1  . testosterone cypionate (DEPOTESTOSTERONE CYPIONATE) 200 MG/ML injection Inject 0.5 mLs (100 mg total) into the muscle once a week. 2 mL 0  . vitamin C (ASCORBIC ACID) 500 MG tablet Take 500 mg by mouth daily.     No current facility-administered medications for  this visit.       REVIEW OF SYSTEMS (Negative unless checked)  Constitutional: [] Weight loss  [] Fever  [] Chills Cardiac: [x] Chest pain   [] Chest pressure   [] Palpitations   [] Shortness of breath when laying flat   [] Shortness of breath at rest   [] Shortness of breath with exertion. Vascular:  [] Pain in legs with walking   [] Pain in legs at rest   [] Pain in legs when laying flat   [] Claudication   [] Pain in feet when walking  [] Pain in feet at rest  [] Pain in feet when laying flat   [] History of DVT   [] Phlebitis   [x] Swelling in legs   [x] Varicose veins   [] Non-healing ulcers Pulmonary:   [] Uses home oxygen   [] Productive cough   [] Hemoptysis   [] Wheeze  [] COPD   [] Asthma Neurologic:  [] Dizziness   [] Blackouts   [] Seizures   [] History of stroke   [] History of TIA  [] Aphasia   [] Temporary blindness   [] Dysphagia   [] Weakness or numbness in arms   [] Weakness or numbness in legs Musculoskeletal:  [x] Arthritis   [] Joint swelling   [] Joint pain   [] Low back pain Hematologic:  [] Easy bruising  [] Easy bleeding   [] Hypercoagulable state   [] Anemic  [] Hepatitis Gastrointestinal:  [] Blood in stool   [] Vomiting blood  [] Gastroesophageal reflux/heartburn   [] Abdominal pain Genitourinary:  [] Chronic kidney disease   [] Difficult urination  [] Frequent urination  [] Burning with urination   [] Hematuria Skin:  [] Rashes   [] Ulcers   [] Wounds Psychological:  [] History of anxiety   []  History of major depression.    Physical Exam BP (!) 144/80 (BP Location: Right Arm)   Pulse 67   Resp 17   Ht 5\' 6"  (1.676 m)   Wt 221 lb 6.4 oz (100.4 kg)   BMI 35.73 kg/m  Gen:  WD/WN, NAD.  Appears younger than stated age Head: [place]/AT, No temporalis wasting.  Ear/Nose/Throat: Hearing grossly intact, dentition good Eyes: Sclera non-icteric. Conjunctiva clear Neck: Supple. Trachea midline Pulmonary:  Good air movement, no use of accessory muscles, respirations not labored.  Cardiac: RRR, No JVD Vascular: Varicosities scattered and measuring up to 1-2 mm in the right lower extremity        Varicosities diffuse and measuring up to 2-3 mm in the left lower extremity with a large patch of varicosities in the medial left calf Vessel Right Left  Radial Palpable Palpable                          PT Palpable Palpable  DP Palpable Palpable   Gastrointestinal: soft, non-tender/non-distended.  Musculoskeletal: M/S 5/5 throughout.   Trace BLE edema Neurologic: Sensation grossly intact in extremities.  Symmetrical.  Speech is fluent.  Psychiatric: Judgment intact, Mood & affect appropriate for pt's clinical situation. Dermatologic: No rashes or ulcers noted.  No cellulitis or open wounds.    Radiology No results  found.  Labs Recent Results (from the past 2160 hour(s))  Hematocrit     Status: None   Collection Time: 05/29/17  9:15 AM  Result Value Ref Range   Hematocrit 48.5 37.5 - 51.0 %  PSA     Status: Abnormal   Collection Time: 05/29/17  9:15 AM  Result Value Ref Range   Prostate Specific Ag, Serum 4.6 (H) 0.0 - 4.0 ng/mL    Comment: Roche ECLIA methodology. According to the American Urological Association, Serum PSA should decrease and remain at undetectable levels after radical  prostatectomy. The AUA defines biochemical recurrence as an initial PSA value 0.2 ng/mL or greater followed by a subsequent confirmatory PSA value 0.2 ng/mL or greater. Values obtained with different assay methods or kits cannot be used interchangeably. Results cannot be interpreted as absolute evidence of the presence or absence of malignant disease.     Assessment/Plan:  Hyperlipidemia lipid control important in reducing the progression of atherosclerotic disease.    Coronary artery disease involving native heart without angina pectoris Our office procedures would have a minimum of cardiac risk so no extensive cardiac evaluation will be undertaken unless his symptoms worsen.  Varicose veins of both lower extremities with complications    The patient has symptoms consistent with chronic venous insufficiency.  He has had significant hemorrhage from varicosities on the left leg and has some pain and swelling bilaterally.  We discussed the natural history and treatment options for venous disease. I recommended the regular use of 20 - 30 mm Hg compression stockings, and prescribed these today. I recommended leg elevation and anti-inflammatories as needed for pain. I have also recommended a complete venous duplex to assess the venous system for reflux or thrombotic issues. This can be done at the patient's convenience. I will see the patient back after the duplex to assess the response to conservative management,  and determine further treatment options.     Leotis Pain 07/17/2017, 9:30 AM   This note was created with Dragon medical transcription system.  Any errors from dictation are unintentional.

## 2017-07-17 NOTE — Assessment & Plan Note (Signed)
Our office procedures would have a minimum of cardiac risk so no extensive cardiac evaluation will be undertaken unless his symptoms worsen.

## 2017-07-20 ENCOUNTER — Other Ambulatory Visit: Payer: Self-pay | Admitting: Urology

## 2017-07-20 ENCOUNTER — Encounter: Payer: Self-pay | Admitting: Urology

## 2017-07-20 ENCOUNTER — Ambulatory Visit (INDEPENDENT_AMBULATORY_CARE_PROVIDER_SITE_OTHER): Payer: Medicare Other | Admitting: Urology

## 2017-07-20 ENCOUNTER — Ambulatory Visit (INDEPENDENT_AMBULATORY_CARE_PROVIDER_SITE_OTHER): Payer: Medicare Other | Admitting: Vascular Surgery

## 2017-07-20 ENCOUNTER — Ambulatory Visit (INDEPENDENT_AMBULATORY_CARE_PROVIDER_SITE_OTHER): Payer: Medicare Other

## 2017-07-20 ENCOUNTER — Encounter (INDEPENDENT_AMBULATORY_CARE_PROVIDER_SITE_OTHER): Payer: Self-pay | Admitting: Vascular Surgery

## 2017-07-20 ENCOUNTER — Other Ambulatory Visit (INDEPENDENT_AMBULATORY_CARE_PROVIDER_SITE_OTHER): Payer: Self-pay | Admitting: Vascular Surgery

## 2017-07-20 VITALS — BP 156/78 | HR 73 | Resp 16 | Ht 66.0 in | Wt 224.0 lb

## 2017-07-20 VITALS — BP 161/88 | HR 61 | Resp 17 | Ht 66.0 in | Wt 222.0 lb

## 2017-07-20 DIAGNOSIS — I890 Lymphedema, not elsewhere classified: Secondary | ICD-10-CM | POA: Diagnosis not present

## 2017-07-20 DIAGNOSIS — I2510 Atherosclerotic heart disease of native coronary artery without angina pectoris: Secondary | ICD-10-CM | POA: Diagnosis not present

## 2017-07-20 DIAGNOSIS — R972 Elevated prostate specific antigen [PSA]: Secondary | ICD-10-CM

## 2017-07-20 DIAGNOSIS — I83891 Varicose veins of right lower extremities with other complications: Secondary | ICD-10-CM | POA: Diagnosis not present

## 2017-07-20 DIAGNOSIS — I872 Venous insufficiency (chronic) (peripheral): Secondary | ICD-10-CM | POA: Diagnosis not present

## 2017-07-20 DIAGNOSIS — I83893 Varicose veins of bilateral lower extremities with other complications: Secondary | ICD-10-CM

## 2017-07-20 DIAGNOSIS — I83892 Varicose veins of left lower extremities with other complications: Secondary | ICD-10-CM

## 2017-07-20 DIAGNOSIS — C61 Malignant neoplasm of prostate: Secondary | ICD-10-CM | POA: Diagnosis not present

## 2017-07-20 MED ORDER — GENTAMICIN SULFATE 40 MG/ML IJ SOLN: 80.0000 mg | Freq: Once | INTRAMUSCULAR | Status: DC

## 2017-07-20 MED ORDER — LEVOFLOXACIN 500 MG PO TABS: 500.0000 mg | ORAL_TABLET | Freq: Once | ORAL | Status: DC

## 2017-07-20 NOTE — Progress Notes (Signed)
Subjective:    Patient ID: Keith Murillo, male    DOB: 07-07-1950, 67 y.o.   MRN: [id number] Chief Complaint  Patient presents with  . Follow-up    bil reflux results   Patient presents to review vascular studies.  The patient was last seen on July 17, 2017 and evaluation of a bleeding varicosity noted to the left lower extremity.  The patient does not experience any additional hemorrhage from the site.  Since his initial visit, the patient has started to engage in conservative therapy including wearing medical grade 1 compression socks, elevating his legs and remaining active.  The patient underwent a bilateral lower extremity venous duplex which was notable for abnormal reflux times in the right common femoral vein, femoral vein in the thigh, popliteal vein, and great saphenous vein at the mid thigh.  There is no evidence of deep vein thrombosis in the right lower extremity.  Abnormal reflux times were noted in the left femoral vein in the thigh, popliteal vein, great saphenous vein at the proximal thigh, great saphenous vein at the mid thigh, and proximal small saphenous vein.  There was no evidence of deep vein thrombosis in the left lower extremity.  Baker's cyst was found in the left popliteal fossa.  The patient denies any rest pain or ulcer formation to the bilateral lower extremity.  The patient denies any fever, nausea vomiting.  Review of Systems  Constitutional: Negative.   HENT: Negative.   Eyes: Negative.   Respiratory: Negative.   Cardiovascular: Positive for leg swelling.       Painful varicose veins Hemorrhage from left lower extremity varicosity  Gastrointestinal: Negative.   Endocrine: Negative.   Genitourinary: Negative.   Musculoskeletal: Negative.   Skin: Negative.   Allergic/Immunologic: Negative.   Neurological: Negative.   Hematological: Negative.   Psychiatric/Behavioral: Negative.       Objective:   Physical Exam  Constitutional: He is oriented to  person, place, and time. He appears well-developed and well-nourished. No distress.  HENT:  Head: Normocephalic and atraumatic.  Right Ear: External ear normal.  Left Ear: External ear normal.  Eyes: Pupils are equal, round, and reactive to light. Conjunctivae and EOM are normal.  Neck: Normal range of motion.  Cardiovascular: Normal rate, regular rhythm, normal heart sounds and intact distal pulses.  Pulses:      Radial pulses are 2+ on the right side, and 2+ on the left side.       Dorsalis pedis pulses are 2+ on the right side, and 2+ on the left side.       Posterior tibial pulses are 2+ on the right side, and 2+ on the left side.  Pulmonary/Chest: Effort normal and breath sounds normal.  Musculoskeletal: Normal range of motion. He exhibits edema (Mild to moderate nonpitting edema noted bilaterally).  Neurological: He is alert and oriented to person, place, and time.  Skin: He is not diaphoretic.  Mixture of diffuse greater than 1 cm and less than 1 cm varicosities noted to the bilateral lower extremity.  Mild stasis dermatitis to the bilateral lower extremity.  There is no fibrosis, cellulitis or active ulcerations noted to the bilateral legs.  The area of hemorrhage to the varicosities noted to the medial aspect of the left leg is healing well.  Psychiatric: He has a normal mood and affect. His behavior is normal. Judgment and thought content normal.  Vitals reviewed.  BP (!) 161/88 (BP Location: Right Arm)   Pulse 61  Resp 17   Ht 5\' 6"  (1.676 m)   Wt 222 lb (100.7 kg)   BMI 35.83 kg/m   Past Medical History:  Diagnosis Date  . Chest pain   . Heart attack ([place])   . Hypercholesterolemia   . Hyperlipidemia   . Ischemic heart disease September 2004   Known--with non-Q-wave myocardial infarction   . Obesity   . Sleep apnea    Social History   Socioeconomic History  . Marital status: Married    Spouse name: Not on file  . Number of children: Not on file  . Years of  education: Not on file  . Highest education level: Not on file  Occupational History  . Not on file  Social Needs  . Financial resource strain: Not on file  . Food insecurity:    Worry: Not on file    Inability: Not on file  . Transportation needs:    Medical: Not on file    Non-medical: Not on file  Tobacco Use  . Smoking status: Never Smoker  . Smokeless tobacco: Never Used  Substance and Sexual Activity  . Alcohol use: Yes    Comment: occas  . Drug use: No  . Sexual activity: Not on file  Lifestyle  . Physical activity:    Days per week: Not on file    Minutes per session: Not on file  . Stress: Not on file  Relationships  . Social connections:    Talks on phone: Not on file    Gets together: Not on file    Attends religious service: Not on file    Active member of club or organization: Not on file    Attends meetings of clubs or organizations: Not on file    Relationship status: Not on file  . Intimate partner violence:    Fear of current or ex partner: Not on file    Emotionally abused: Not on file    Physically abused: Not on file    Forced sexual activity: Not on file  Other Topics Concern  . Not on file  Social History Narrative  . Not on file   Past Surgical History:  Procedure Laterality Date  . CARDIAC CATHETERIZATION  11/2007   Family History  Problem Relation Age of Onset  . Cancer Mother   . Hearing loss Father   . Cancer Maternal Grandmother   . Parkinson's disease Maternal Grandfather   . Stroke Maternal Grandfather   . Stroke Paternal Grandmother   . Heart attack Paternal Grandfather    No Known Allergies     Assessment & Plan:  Patient presents to review vascular studies.  The patient was last seen on July 17, 2017 and evaluation of a bleeding varicosity noted to the left lower extremity.  The patient does not experience any additional hemorrhage from the site.  Since his initial visit, the patient has started to engage in conservative  therapy including wearing medical grade 1 compression socks, elevating his legs and remaining active.  The patient underwent a bilateral lower extremity venous duplex which was notable for abnormal reflux times in the right common femoral vein, femoral vein in the thigh, popliteal vein, and great saphenous vein at the mid thigh.  There is no evidence of deep vein thrombosis in the right lower extremity.  Abnormal reflux times were noted in the left femoral vein in the thigh, popliteal vein, great saphenous vein at the proximal thigh, great saphenous vein at the mid thigh, and  proximal small saphenous vein.  There was no evidence of deep vein thrombosis in the left lower extremity.  Baker's cyst was found in the left popliteal fossa.  The patient denies any rest pain or ulcer formation to the bilateral lower extremity.  The patient denies any fever, nausea vomiting.  1. Chronic venous insufficiency - New Patient was found to have chronic venous insufficiency to both the deep and superficial system located to the bilateral lower extremity Due to the location of the venous reflux found in the deep system the patient is not a candidate for laser or sclerotherapy into these particular veins. The patient is a candidate for bilateral laser ablation to the great saphenous vein and the left small saphenous vein. Due to the patient's recent episode of hemorrhage from a varicosity located to the left lower extremity the patient is likely to benefit from endovenous laser ablation. I have discussed the risks and benefits of the procedure. The risks primarily include DVT, recanalization, bleeding, infection, and inability to gain access. I will apply to his insurance The patient should undergo saline sclerotherapy to the large superficial varicosities noted to the left medial aspect where the patient has had a prior hemorrhage. He is at high risk for another episode of hemorrhage if these veins are not treated  appropriately. I will also apply to his insurance for saline sclerotherapy to this area In the meantime the patient is to continue engaging in conservative therapy including wearing medical grade 1 compression socks, elevating his legs and remaining active  2. Lymphedema - New The patient may be a candidate for lymphedema pump in the future if conservative therapy, laser ablation and sclerotherapy is not successful.  3. Varicose veins of both lower extremities with complications - Stable As above  Current Outpatient Medications on File Prior to Visit  Medication Sig Dispense Refill  . aspirin 325 MG tablet Take 325 mg by mouth daily.    . AZO-CRANBERRY PO Take by mouth.    . B Complex Vitamins (B COMPLEX PO) Take by mouth daily.    Marland Kitchen Bioflavonoid Products (GRAPE SEED PO) Take by mouth daily.    . Biotin 5000 MCG CAPS Take by mouth.    Marland Kitchen DHEA 25 MG CAPS Take by mouth.    Marland Kitchen GLUCOSAMINE HCL PO Take by mouth.    Marland Kitchen L-Arginine 1000 MG TABS Take by mouth.    Marland Kitchen lisinopril (PRINIVIL,ZESTRIL) 10 MG tablet TAKE ONE TABLET BY MOUTH ONE TIME DAILY  30 tablet 2  . sildenafil (REVATIO) 20 MG tablet 2-5 tabs 1 hour prior to intercourse 90 tablet 1  . testosterone cypionate (DEPOTESTOSTERONE CYPIONATE) 200 MG/ML injection Inject 0.5 mLs (100 mg total) into the muscle once a week. 2 mL 0  . vitamin C (ASCORBIC ACID) 500 MG tablet Take 500 mg by mouth daily.     No current facility-administered medications on file prior to visit.    There are no Patient Instructions on file for this visit. No follow-ups on file.  Caedon Bond A Zion Ta, PA-C

## 2017-07-20 NOTE — Progress Notes (Signed)
Prostate Biopsy Procedure   Informed consent was obtained after discussing risks/benefits of the procedure.  A time out was performed to ensure correct patient identity.  Pre-Procedure: - Last PSA Level: Rising PSA of 4.6 on testosterone replacement therapy.  - Gentamicin given prophylactically - Levaquin 500 mg administered PO -Transrectal Ultrasound performed revealing a 62 gm prostate -No significant hypoechoic or median lobe noted  Procedure: - Prostate block performed using 10 cc 1% lidocaine and biopsies taken from sextant areas, a total of 12 under ultrasound guidance.  Post-Procedure: - Patient tolerated the procedure well - He was counseled to seek immediate medical attention if experiences any severe pain, significant bleeding, or fevers - Return in one week to discuss biopsy results   [name], MD

## 2017-07-23 ENCOUNTER — Other Ambulatory Visit: Payer: Self-pay | Admitting: Urology

## 2017-07-23 LAB — PATHOLOGY REPORT

## 2017-07-31 ENCOUNTER — Ambulatory Visit: Payer: Medicare Other | Admitting: Family Medicine

## 2017-08-03 ENCOUNTER — Ambulatory Visit (INDEPENDENT_AMBULATORY_CARE_PROVIDER_SITE_OTHER): Payer: Medicare Other | Admitting: Urology

## 2017-08-03 ENCOUNTER — Encounter: Payer: Self-pay | Admitting: Urology

## 2017-08-03 VITALS — BP 149/82 | HR 80 | Ht 66.0 in | Wt 224.0 lb

## 2017-08-03 DIAGNOSIS — C61 Malignant neoplasm of prostate: Secondary | ICD-10-CM

## 2017-08-03 NOTE — Progress Notes (Signed)
08/03/2017 3:05 PM   JAYSHUN GALENTINE 10-Dec-1950 [id number]  Referring provider: [name], [place], [place] [address]  Chief Complaint  Patient presents with  . Results    HPI: 67 year old male presents for prostate biopsy results.  Biopsy was performed on 07/20/2017 for a rising PSA of 4.6 on TRT.  He had no post biopsy complaints.  Prostate volume was 62 g.  Standard sextant biopsies were performed.  Pathology: The core from the left lateral apex showed Gleason 3+3 adenocarcinoma involving 23% of the submitted tissue.  The remaining biopsies were benign.   PMH: Past Medical History:  Diagnosis Date  . Chest pain   . Heart attack ([place])   . Hypercholesterolemia   . Hyperlipidemia   . Ischemic heart disease September 2004   Known--with non-Q-wave myocardial infarction   . Obesity   . Sleep apnea     Surgical History: Past Surgical History:  Procedure Laterality Date  . CARDIAC CATHETERIZATION  11/2007    Home Medications:  Allergies as of 08/03/2017   No Known Allergies     Medication List        Accurate as of 08/03/17  3:05 PM. Always use your most recent med list.          aspirin 325 MG tablet Take 325 mg by mouth daily.   AZO-CRANBERRY PO Take by mouth.   B COMPLEX PO Take by mouth daily.   Biotin 5000 MCG Caps Take by mouth.   DHEA 25 MG Caps Take by mouth.   GLUCOSAMINE HCL PO Take by mouth.   GRAPE SEED PO Take by mouth daily.   L-Arginine 1000 MG Tabs Take by mouth.   lisinopril 10 MG tablet Commonly known as:  PRINIVIL,ZESTRIL TAKE ONE TABLET BY MOUTH ONE TIME DAILY   sildenafil 20 MG tablet Commonly known as:  REVATIO 2-5 tabs 1 hour prior to intercourse   vitamin C 500 MG tablet Commonly known as:  ASCORBIC ACID Take 500 mg by mouth daily.       Allergies: No Known Allergies  Family History: Family History  Problem Relation Age of Onset  . Cancer Mother   . Hearing loss Father     . Cancer Maternal Grandmother   . Parkinson's disease Maternal Grandfather   . Stroke Maternal Grandfather   . Stroke Paternal Grandmother   . Heart attack Paternal Grandfather     Social History:  reports that he has never smoked. He has never used smokeless tobacco. He reports that he drinks alcohol. He reports that he does not use drugs.  ROS: UROLOGY Frequent Urination?: No Hard to postpone urination?: No Burning/pain with urination?: No Get up at night to urinate?: Yes Leakage of urine?: No Urine stream starts and stops?: No Trouble starting stream?: No Do you have to strain to urinate?: No Blood in urine?: No Urinary tract infection?: No Sexually transmitted disease?: No Injury to kidneys or bladder?: No Painful intercourse?: No Weak stream?: No Erection problems?: No Penile pain?: No  Gastrointestinal Nausea?: No Vomiting?: No Indigestion/heartburn?: No Diarrhea?: No Constipation?: No  Constitutional Fever: No Night sweats?: No Weight loss?: No Fatigue?: No  Skin Skin rash/lesions?: No Itching?: No  Eyes Blurred vision?: No Double vision?: No  Ears/Nose/Throat Sore throat?: No Sinus problems?: No  Hematologic/Lymphatic Swollen glands?: No Easy bruising?: No  Cardiovascular Leg swelling?: No Chest pain?: No  Respiratory Cough?: No Shortness of breath?: No  Endocrine Excessive thirst?: No  Musculoskeletal Back pain?: No Joint pain?: No  Neurological Headaches?: No Dizziness?: No  Psychologic Depression?: No Anxiety?: No  Physical Exam: BP (!) 149/82 (BP Location: Left Arm, Patient Position: Sitting, Cuff Size: Large)   Pulse 80   Ht 5\' 6"  (1.676 m)   Wt 224 lb (101.6 kg)   BMI 36.15 kg/m   Constitutional:  Alert and oriented, No acute distress.   Assessment & Plan:   67 year old male with clinical T1c low risk adenocarcinoma the prostate.  The pathology report was discussed in detail with Mr. Fennell and his wife.  We  discussed curative treatment options of radical prostatectomy, radiation modalities and HIFU.  He would also be a candidate for active surveillance which was discussed in detail.  At this time he initially desires active surveillance and may consider HIFU in the future.  Follow-up in 3 months for a PSA/DRE.  Recommend a prostate MRI 6-9 months.  Greater than 50% of this 15-minute visit was spent counseling the patient.   Return in about 3 months (around 11/03/2017) for Recheck, PSA.  Abbie Sons, [place] [address], [place],  [address] [phone]

## 2017-08-20 ENCOUNTER — Ambulatory Visit (INDEPENDENT_AMBULATORY_CARE_PROVIDER_SITE_OTHER): Payer: Medicare Other | Admitting: Vascular Surgery

## 2017-08-20 ENCOUNTER — Encounter (INDEPENDENT_AMBULATORY_CARE_PROVIDER_SITE_OTHER): Payer: Self-pay | Admitting: Vascular Surgery

## 2017-08-20 VITALS — BP 143/79 | HR 68 | Resp 12 | Ht 69.0 in | Wt 225.0 lb

## 2017-08-20 DIAGNOSIS — I83892 Varicose veins of left lower extremities with other complications: Secondary | ICD-10-CM | POA: Diagnosis not present

## 2017-08-20 DIAGNOSIS — I83893 Varicose veins of bilateral lower extremities with other complications: Secondary | ICD-10-CM

## 2017-08-20 DIAGNOSIS — I872 Venous insufficiency (chronic) (peripheral): Secondary | ICD-10-CM | POA: Diagnosis not present

## 2017-08-20 NOTE — Progress Notes (Signed)
Varicose veins of left lower extremity with inflammation (454.1  I83.10) Current Plans   Indication: Patient presents with symptomatic varicose veins of the left lower extremity.   Procedure: Sclerotherapy using hypertonic saline mixed with 1% Lidocaine was performed on the left lower extremity. Compression wraps were placed. The patient tolerated the procedure well.  The patient has received insurance approval for endovenous laser ablation.  I reviewed the patient's venous reflux study with him and discuss the anatomy and where the laser ablation with take place.  We discussed the procedure, risks and benefits.  The patient expresses understanding and at this time would like to think about it.  The patient was given a brochure about the procedure.  The patient will call the office she would like to move forward with the endovenous laser ablation to the left leg then followed by the right leg.

## 2017-08-26 ENCOUNTER — Other Ambulatory Visit: Payer: Self-pay

## 2017-08-26 MED ORDER — SILDENAFIL CITRATE 20 MG PO TABS: ORAL_TABLET | ORAL | 1 refills | Status: DC

## 2017-10-14 ENCOUNTER — Ambulatory Visit: Payer: Medicare Other | Admitting: Urology

## 2017-10-20 ENCOUNTER — Ambulatory Visit: Payer: Medicare Other | Admitting: Urology

## 2017-11-02 ENCOUNTER — Other Ambulatory Visit: Payer: Self-pay | Admitting: Family Medicine

## 2017-11-02 DIAGNOSIS — C61 Malignant neoplasm of prostate: Secondary | ICD-10-CM

## 2017-11-04 ENCOUNTER — Other Ambulatory Visit: Payer: Medicare Other

## 2017-11-04 DIAGNOSIS — C61 Malignant neoplasm of prostate: Secondary | ICD-10-CM

## 2017-11-05 LAB — PSA: Prostate Specific Ag, Serum: 4.1 ng/mL — ABNORMAL HIGH (ref 0.0–4.0)

## 2017-11-06 ENCOUNTER — Ambulatory Visit: Payer: Medicare Other | Admitting: Urology

## 2017-11-09 ENCOUNTER — Other Ambulatory Visit: Payer: Self-pay | Admitting: Family Medicine

## 2017-11-09 DIAGNOSIS — I10 Essential (primary) hypertension: Secondary | ICD-10-CM

## 2017-11-13 ENCOUNTER — Ambulatory Visit: Payer: Medicare Other | Admitting: Urology

## 2017-11-19 ENCOUNTER — Encounter: Payer: Self-pay | Admitting: Urology

## 2017-11-19 ENCOUNTER — Ambulatory Visit (INDEPENDENT_AMBULATORY_CARE_PROVIDER_SITE_OTHER): Payer: Medicare Other | Admitting: Urology

## 2017-11-19 VITALS — BP 174/79 | HR 80 | Ht 66.0 in | Wt 235.0 lb

## 2017-11-19 DIAGNOSIS — C61 Malignant neoplasm of prostate: Secondary | ICD-10-CM | POA: Diagnosis not present

## 2017-11-19 DIAGNOSIS — E291 Testicular hypofunction: Secondary | ICD-10-CM | POA: Diagnosis not present

## 2017-11-19 DIAGNOSIS — N5201 Erectile dysfunction due to arterial insufficiency: Secondary | ICD-10-CM | POA: Diagnosis not present

## 2017-11-19 NOTE — Progress Notes (Signed)
11/19/2017 9:36 AM   Keith Murillo September 27, 1950 [id number]  Referring provider: [name], [place],  [address]  Chief Complaint  Patient presents with  . Prostate Cancer   Urologic history: 1. cT1c prostate cancer; PSA 4.6; volume 62 g; 1/12 cores 3 +3 adenocarcinoma left lateral apex (23%) -Elected active surveillance  2.  Hypogonadism -TRT discontinued  3.  Erectile dysfunction -Generic sildenafil, venous compression band  HPI: 67 year old male presents for follow-up of prostate cancer.  Since his last visit he has had some increased nocturia and does note frequency and urgency when drinking black tea.  Denies dysuria or gross hematuria.  Denies flank, abdominal, pelvic or scrotal pain.  Since stopping testosterone he does note increased tiredness, fatigue and decreased libido.  PSA on 11/04/2017 was 4.1   PMH: Past Medical History:  Diagnosis Date  . Chest pain   . Heart attack ([place])   . Hypercholesterolemia   . Hyperlipidemia   . Ischemic heart disease September 2004   Known--with non-Q-wave myocardial infarction   . Obesity   . Sleep apnea     Surgical History: Past Surgical History:  Procedure Laterality Date  . CARDIAC CATHETERIZATION  11/2007    Home Medications:  Allergies as of 11/19/2017   No Known Allergies     Medication List        Accurate as of 11/19/17  9:36 AM. Always use your most recent med list.          aspirin 325 MG tablet Take 325 mg by mouth daily.   AZO-CRANBERRY PO Take by mouth.   B COMPLEX PO Take by mouth daily.   Biotin 5000 MCG Caps Take by mouth.   DHEA 25 MG Caps Take by mouth.   GLUCOSAMINE HCL PO Take by mouth.   GRAPE SEED PO Take by mouth daily.   L-Arginine 1000 MG Tabs Take by mouth.   lisinopril 10 MG tablet Commonly known as:  PRINIVIL,ZESTRIL TAKE ONE TABLET BY MOUTH ONE TIME DAILY   sildenafil 20 MG tablet Commonly known as:  REVATIO 2-5  tabs 1 hour prior to intercourse   vitamin C 500 MG tablet Commonly known as:  ASCORBIC ACID Take 500 mg by mouth daily.       Allergies: No Known Allergies  Family History: Family History  Problem Relation Age of Onset  . Cancer Mother   . Hearing loss Father   . Cancer Maternal Grandmother   . Parkinson's disease Maternal Grandfather   . Stroke Maternal Grandfather   . Stroke Paternal Grandmother   . Heart attack Paternal Grandfather     Social History:  reports that he has never smoked. He has never used smokeless tobacco. He reports that he drinks alcohol. He reports that he does not use drugs.  ROS: UROLOGY Frequent Urination?: No Hard to postpone urination?: Yes Burning/pain with urination?: No Get up at night to urinate?: Yes Leakage of urine?: No Urine stream starts and stops?: No Trouble starting stream?: No Do you have to strain to urinate?: No Blood in urine?: No Urinary tract infection?: No Sexually transmitted disease?: No Injury to kidneys or bladder?: No Painful intercourse?: No Weak stream?: No Erection problems?: Yes Penile pain?: No  Gastrointestinal Nausea?: No Vomiting?: No Indigestion/heartburn?: No Diarrhea?: No Constipation?: No  Constitutional Fever: No Night sweats?: No Weight loss?: No Fatigue?: No  Skin Skin rash/lesions?: No Itching?: No  Eyes Blurred vision?: No Double vision?: No  Ears/Nose/Throat  Sore throat?: No Sinus problems?: No  Hematologic/Lymphatic Swollen glands?: No Easy bruising?: No  Cardiovascular Leg swelling?: No Chest pain?: No  Respiratory Cough?: No Shortness of breath?: No  Endocrine Excessive thirst?: No  Musculoskeletal Back pain?: No Joint pain?: Yes  Neurological Headaches?: No Dizziness?: No  Psychologic Depression?: No Anxiety?: No  Physical Exam: BP (!) 174/79 (BP Location: Left Arm, Patient Position: Sitting, Cuff Size: Large) Comment: Pt had not had BP meds   Pulse 80   Ht 5\' 6"  (1.676 m)   Wt 235 lb (106.6 kg)   BMI 37.93 kg/m   Constitutional:  Alert and oriented, No acute distress. HEENT: [place] AT, moist mucus membranes.  Trachea midline, no masses. Cardiovascular: No clubbing, cyanosis, or edema. Respiratory: Normal respiratory effort, no increased work of breathing. GI: Abdomen is soft, nontender, nondistended, no abdominal masses GU: No CVA tenderness.  Prostate 50 g, smooth without nodules Lymph: No cervical or inguinal lymphadenopathy. Skin: No rashes, bruises or suspicious lesions. Neurologic: Grossly intact, no focal deficits, moving all 4 extremities. Psychiatric: Normal mood and affect.  Assessment & Plan:   67 year old male with low risk prostate cancer.  PSA and DRE are stable.  Increased symptoms hypogonadism off TRT however not able to start at this time.  Recommend follow-up March 2020 with PSA and MRI prior to visit.   Abbie Sons, [place] [address] [place], [place] [address] [phone]

## 2017-11-23 ENCOUNTER — Encounter: Payer: Self-pay | Admitting: Urology

## 2017-11-24 ENCOUNTER — Telehealth: Payer: Self-pay | Admitting: Family Medicine

## 2017-11-24 NOTE — Telephone Encounter (Signed)
Called both numbers on file to schedule bp f/u in 2-4 weeks from 11/18. No option to leave voicemail. Will try again at a later time.  KED

## 2017-11-24 NOTE — Telephone Encounter (Signed)
-----   Message from [name], [place] sent at 11/24/2017  6:43 AM EST ----- 2-4 weeks would be fine Thanks,  Debbie ----- Message ----- From: Jake Bathe Sent: 11/23/2017  11:34 AM EST To: [name], FNP  How quickly would you like for the patient to come in for the appointment?  ----- Message ----- From: [name], FNP Sent: 11/23/2017  11:17 AM EST To: Libby Maw [place]  Will you call patient and schedule him for a blood pressure follow up. Tell him I got the note from his urologist and his blood pressure is too high. If he takes readings at home or work, please ask him to bring those with him.  Thanks,  [employer]

## 2017-12-31 ENCOUNTER — Telehealth: Payer: Self-pay | Admitting: Internal Medicine

## 2017-12-31 NOTE — Telephone Encounter (Signed)
LM with [facility] to send [place] supply request.

## 2018-01-04 NOTE — Telephone Encounter (Signed)
Order for cpap supplies was faxed on 12/25/17 to [facility]. Nothing further needed.

## 2018-01-04 NOTE — Telephone Encounter (Signed)
Patient has been contacted by [facility] and supplies are being shipped.

## 2018-02-06 DIAGNOSIS — G4733 Obstructive sleep apnea (adult) (pediatric): Secondary | ICD-10-CM | POA: Diagnosis not present

## 2018-03-12 ENCOUNTER — Emergency Department (HOSPITAL_COMMUNITY): Payer: Medicare Other

## 2018-03-12 ENCOUNTER — Other Ambulatory Visit: Payer: Self-pay

## 2018-03-12 ENCOUNTER — Inpatient Hospital Stay (HOSPITAL_COMMUNITY)
Admission: EM | Admit: 2018-03-12 | Discharge: 2018-03-17 | DRG: 872 | Disposition: A | Discharge status: Home / Self Care | Payer: Medicare Other | Attending: Internal Medicine | Admitting: Internal Medicine

## 2018-03-12 ENCOUNTER — Encounter (HOSPITAL_COMMUNITY): Payer: Self-pay | Admitting: Emergency Medicine

## 2018-03-12 DIAGNOSIS — R7989 Other specified abnormal findings of blood chemistry: Secondary | ICD-10-CM | POA: Diagnosis not present

## 2018-03-12 DIAGNOSIS — A411 Sepsis due to other specified staphylococcus: Secondary | ICD-10-CM | POA: Diagnosis present

## 2018-03-12 DIAGNOSIS — I252 Old myocardial infarction: Secondary | ICD-10-CM

## 2018-03-12 DIAGNOSIS — K760 Fatty (change of) liver, not elsewhere classified: Secondary | ICD-10-CM | POA: Diagnosis present

## 2018-03-12 DIAGNOSIS — R110 Nausea: Secondary | ICD-10-CM | POA: Diagnosis not present

## 2018-03-12 DIAGNOSIS — Z809 Family history of malignant neoplasm, unspecified: Secondary | ICD-10-CM | POA: Diagnosis not present

## 2018-03-12 DIAGNOSIS — R0789 Other chest pain: Secondary | ICD-10-CM | POA: Diagnosis present

## 2018-03-12 DIAGNOSIS — Z888 Allergy status to other drugs, medicaments and biological substances status: Secondary | ICD-10-CM | POA: Diagnosis not present

## 2018-03-12 DIAGNOSIS — I2510 Atherosclerotic heart disease of native coronary artery without angina pectoris: Secondary | ICD-10-CM | POA: Diagnosis present

## 2018-03-12 DIAGNOSIS — Z955 Presence of coronary angioplasty implant and graft: Secondary | ICD-10-CM | POA: Diagnosis not present

## 2018-03-12 DIAGNOSIS — N3091 Cystitis, unspecified with hematuria: Secondary | ICD-10-CM | POA: Diagnosis not present

## 2018-03-12 DIAGNOSIS — Z823 Family history of stroke: Secondary | ICD-10-CM | POA: Diagnosis not present

## 2018-03-12 DIAGNOSIS — E785 Hyperlipidemia, unspecified: Secondary | ICD-10-CM | POA: Diagnosis not present

## 2018-03-12 DIAGNOSIS — I10 Essential (primary) hypertension: Secondary | ICD-10-CM | POA: Diagnosis present

## 2018-03-12 DIAGNOSIS — E7800 Pure hypercholesterolemia, unspecified: Secondary | ICD-10-CM | POA: Diagnosis present

## 2018-03-12 DIAGNOSIS — B957 Other staphylococcus as the cause of diseases classified elsewhere: Secondary | ICD-10-CM | POA: Diagnosis not present

## 2018-03-12 DIAGNOSIS — Z8249 Family history of ischemic heart disease and other diseases of the circulatory system: Secondary | ICD-10-CM | POA: Diagnosis not present

## 2018-03-12 DIAGNOSIS — R319 Hematuria, unspecified: Secondary | ICD-10-CM | POA: Diagnosis not present

## 2018-03-12 DIAGNOSIS — C61 Malignant neoplasm of prostate: Secondary | ICD-10-CM | POA: Diagnosis present

## 2018-03-12 DIAGNOSIS — R079 Chest pain, unspecified: Secondary | ICD-10-CM | POA: Diagnosis not present

## 2018-03-12 DIAGNOSIS — I340 Nonrheumatic mitral (valve) insufficiency: Secondary | ICD-10-CM | POA: Diagnosis present

## 2018-03-12 DIAGNOSIS — Z820 Family history of epilepsy and other diseases of the nervous system: Secondary | ICD-10-CM

## 2018-03-12 DIAGNOSIS — E669 Obesity, unspecified: Secondary | ICD-10-CM | POA: Diagnosis not present

## 2018-03-12 DIAGNOSIS — I248 Other forms of acute ischemic heart disease: Secondary | ICD-10-CM | POA: Diagnosis not present

## 2018-03-12 DIAGNOSIS — A419 Sepsis, unspecified organism: Secondary | ICD-10-CM | POA: Diagnosis not present

## 2018-03-12 DIAGNOSIS — A4181 Sepsis due to Enterococcus: Secondary | ICD-10-CM | POA: Diagnosis not present

## 2018-03-12 DIAGNOSIS — Z6837 Body mass index (BMI) 37.0-37.9, adult: Secondary | ICD-10-CM

## 2018-03-12 DIAGNOSIS — A408 Other streptococcal sepsis: Secondary | ICD-10-CM | POA: Diagnosis not present

## 2018-03-12 DIAGNOSIS — Z79899 Other long term (current) drug therapy: Secondary | ICD-10-CM | POA: Diagnosis not present

## 2018-03-12 DIAGNOSIS — R0602 Shortness of breath: Secondary | ICD-10-CM | POA: Diagnosis not present

## 2018-03-12 DIAGNOSIS — Z7982 Long term (current) use of aspirin: Secondary | ICD-10-CM | POA: Diagnosis not present

## 2018-03-12 DIAGNOSIS — Z8551 Personal history of malignant neoplasm of bladder: Secondary | ICD-10-CM | POA: Diagnosis not present

## 2018-03-12 DIAGNOSIS — R7881 Bacteremia: Secondary | ICD-10-CM | POA: Diagnosis not present

## 2018-03-12 DIAGNOSIS — R6520 Severe sepsis without septic shock: Secondary | ICD-10-CM | POA: Diagnosis not present

## 2018-03-12 DIAGNOSIS — B9561 Methicillin susceptible Staphylococcus aureus infection as the cause of diseases classified elsewhere: Secondary | ICD-10-CM | POA: Diagnosis not present

## 2018-03-12 DIAGNOSIS — B952 Enterococcus as the cause of diseases classified elsewhere: Secondary | ICD-10-CM | POA: Diagnosis not present

## 2018-03-12 DIAGNOSIS — R000 Tachycardia, unspecified: Secondary | ICD-10-CM | POA: Diagnosis not present

## 2018-03-12 DIAGNOSIS — G4733 Obstructive sleep apnea (adult) (pediatric): Secondary | ICD-10-CM | POA: Diagnosis not present

## 2018-03-12 LAB — RESPIRATORY PANEL BY PCR
Adenovirus: NOT DETECTED
BORDETELLA PERTUSSIS-RVPCR: NOT DETECTED
Chlamydophila pneumoniae: NOT DETECTED
Coronavirus 229E: NOT DETECTED
Coronavirus HKU1: NOT DETECTED
Coronavirus NL63: NOT DETECTED
Coronavirus OC43: NOT DETECTED
Influenza A: NOT DETECTED
Influenza B: NOT DETECTED
METAPNEUMOVIRUS-RVPPCR: NOT DETECTED
Mycoplasma pneumoniae: NOT DETECTED
PARAINFLUENZA VIRUS 2-RVPPCR: NOT DETECTED
Parainfluenza Virus 1: NOT DETECTED
Parainfluenza Virus 3: NOT DETECTED
Parainfluenza Virus 4: NOT DETECTED
Respiratory Syncytial Virus: NOT DETECTED
Rhinovirus / Enterovirus: NOT DETECTED

## 2018-03-12 LAB — COMPREHENSIVE METABOLIC PANEL
ALT: 46 U/L — ABNORMAL HIGH (ref 0–44)
AST: 42 U/L — ABNORMAL HIGH (ref 15–41)
Albumin: 3.5 g/dL (ref 3.5–5.0)
Alkaline Phosphatase: 60 U/L (ref 38–126)
Anion gap: 11 (ref 5–15)
BUN: 15 mg/dL (ref 8–23)
CO2: 21 mmol/L — ABNORMAL LOW (ref 22–32)
Calcium: 9.3 mg/dL (ref 8.9–10.3)
Chloride: 107 mmol/L (ref 98–111)
Creatinine, Ser: 1.23 mg/dL (ref 0.61–1.24)
GFR calc Af Amer: >60 mL/min (ref >60)
Glucose, Bld: 133 mg/dL — ABNORMAL HIGH (ref 70–99)
Potassium: 3.6 mmol/L (ref 3.5–5.1)
Sodium: 139 mmol/L (ref 135–145)
Total Bilirubin: 1.2 mg/dL (ref 0.3–1.2)
Total Protein: 5.9 g/dL — ABNORMAL LOW (ref 6.5–8.1)

## 2018-03-12 LAB — CBC WITH DIFFERENTIAL/PLATELET
Abs Immature Granulocytes: 0.08 10*3/uL — ABNORMAL HIGH (ref 0.00–0.07)
Basophils Absolute: 0 10*3/uL (ref 0.0–0.1)
Basophils Relative: 0 %
EOS PCT: 1 %
Eosinophils Absolute: 0.1 10*3/uL (ref 0.0–0.5)
HCT: 44.6 % (ref 39.0–52.0)
Hemoglobin: 14.9 g/dL (ref 13.0–17.0)
Immature Granulocytes: 1 %
Lymphocytes Relative: 3 %
Lymphs Abs: 0.4 10*3/uL — ABNORMAL LOW (ref 0.7–4.0)
MCH: 29.4 pg (ref 26.0–34.0)
MCHC: 33.4 g/dL (ref 30.0–36.0)
MCV: 88 fL (ref 80.0–100.0)
Monocytes Absolute: 0.1 10*3/uL (ref 0.1–1.0)
Monocytes Relative: 1 %
Neutro Abs: 10.4 10*3/uL — ABNORMAL HIGH (ref 1.7–7.7)
Neutrophils Relative %: 94 %
Platelets: 258 10*3/uL (ref 150–400)
RBC: 5.07 MIL/uL (ref 4.22–5.81)
RDW: 13 % (ref 11.5–15.5)
WBC: 11 10*3/uL — ABNORMAL HIGH (ref 4.0–10.5)
nRBC: 0 % (ref 0.0–0.2)

## 2018-03-12 LAB — PROTIME-INR
INR: 1.1 (ref 0.8–1.2)
Prothrombin Time: 14.1 seconds (ref 11.4–15.2)

## 2018-03-12 LAB — LACTIC ACID, PLASMA
LACTIC ACID, VENOUS: 3.1 mmol/L — AB (ref 0.5–1.9)
Lactic Acid, Venous: 2.5 mmol/L (ref 0.5–1.9)
Lactic Acid, Venous: 2.3 mmol/L (ref 0.5–1.9)

## 2018-03-12 LAB — URINALYSIS, ROUTINE W REFLEX MICROSCOPIC
Bilirubin Urine: NEGATIVE
Glucose, UA: NEGATIVE mg/dL
Ketones, ur: NEGATIVE mg/dL
Nitrite: NEGATIVE
Protein, ur: 30 mg/dL — AB
RBC / HPF: >50 RBC/hpf — ABNORMAL HIGH (ref 0–5)
SPECIFIC GRAVITY, URINE: 1.01 (ref 1.005–1.030)
pH: 6 (ref 5.0–8.0)

## 2018-03-12 LAB — I-STAT TROPONIN, ED: Troponin i, poc: 0.02 ng/mL (ref 0.00–0.08)

## 2018-03-12 LAB — INFLUENZA PANEL BY PCR (TYPE A & B)
Influenza A By PCR: NEGATIVE
Influenza B By PCR: NEGATIVE

## 2018-03-12 LAB — PROCALCITONIN: Procalcitonin: 15.9 ng/mL

## 2018-03-12 LAB — LIPASE, BLOOD: Lipase: 35 U/L (ref 11–51)

## 2018-03-12 LAB — CK: Total CK: 159 U/L (ref 49–397)

## 2018-03-12 LAB — TROPONIN I: Troponin I: <0.03 ng/mL (ref <0.03)

## 2018-03-12 MED ORDER — SODIUM CHLORIDE 0.9 % IV BOLUS
500.0000 mL | Freq: Once | INTRAVENOUS | Status: AC
  Administered 2018-03-12: 500 mL via INTRAVENOUS

## 2018-03-12 MED ORDER — VANCOMYCIN HCL IN DEXTROSE 750-5 MG/150ML-% IV SOLN
750.0000 mg | Freq: Three times a day (TID) | INTRAVENOUS | Status: DC
  Administered 2018-03-13 (×2): 750 mg via INTRAVENOUS
  Filled 2018-03-12 (×3): qty 150

## 2018-03-12 MED ORDER — SODIUM CHLORIDE 0.9 % IV SOLN
2.0000 g | Freq: Three times a day (TID) | INTRAVENOUS | Status: DC
  Administered 2018-03-12 – 2018-03-13 (×2): 2 g via INTRAVENOUS
  Filled 2018-03-12 (×3): qty 2

## 2018-03-12 MED ORDER — VANCOMYCIN HCL IN DEXTROSE 1-5 GM/200ML-% IV SOLN: 1000.0000 mg | Freq: Once | INTRAVENOUS | Status: DC

## 2018-03-12 MED ORDER — ONDANSETRON HCL 4 MG/2ML IJ SOLN
4.0000 mg | Freq: Four times a day (QID) | INTRAMUSCULAR | Status: DC | PRN
  Administered 2018-03-14: 4 mg via INTRAVENOUS
  Filled 2018-03-12: qty 2

## 2018-03-12 MED ORDER — HYDROCODONE-ACETAMINOPHEN 5-325 MG PO TABS: 1.0000 | ORAL_TABLET | ORAL | Status: DC | PRN

## 2018-03-12 MED ORDER — METRONIDAZOLE IN NACL 5-0.79 MG/ML-% IV SOLN
500.0000 mg | Freq: Three times a day (TID) | INTRAVENOUS | Status: DC
  Administered 2018-03-12 – 2018-03-13 (×2): 500 mg via INTRAVENOUS
  Filled 2018-03-12 (×2): qty 100

## 2018-03-12 MED ORDER — ACETAMINOPHEN 650 MG RE SUPP: 650.0000 mg | Freq: Four times a day (QID) | RECTAL | Status: DC | PRN

## 2018-03-12 MED ORDER — ACETAMINOPHEN 325 MG PO TABS
650.0000 mg | ORAL_TABLET | Freq: Once | ORAL | Status: AC
  Administered 2018-03-12: 650 mg via ORAL
  Filled 2018-03-12: qty 2

## 2018-03-12 MED ORDER — IOHEXOL 300 MG/ML  SOLN
100.0000 mL | Freq: Once | INTRAMUSCULAR | Status: AC | PRN
  Administered 2018-03-12: 100 mL via INTRAVENOUS

## 2018-03-12 MED ORDER — VANCOMYCIN HCL 10 G IV SOLR
2000.0000 mg | Freq: Once | INTRAVENOUS | Status: AC
  Administered 2018-03-12: 2000 mg via INTRAVENOUS
  Filled 2018-03-12: qty 2000

## 2018-03-12 MED ORDER — SODIUM CHLORIDE 0.9 % IV SOLN
1.0000 g | Freq: Once | INTRAVENOUS | Status: AC
  Administered 2018-03-12: 1 g via INTRAVENOUS
  Filled 2018-03-12: qty 10

## 2018-03-12 MED ORDER — SODIUM CHLORIDE 0.9% FLUSH: 3.0000 mL | Freq: Once | INTRAVENOUS | Status: DC

## 2018-03-12 MED ORDER — ASPIRIN 81 MG PO CHEW
81.0000 mg | CHEWABLE_TABLET | Freq: Every day | ORAL | Status: DC
  Administered 2018-03-13 – 2018-03-17 (×5): 81 mg via ORAL
  Filled 2018-03-12 (×5): qty 1

## 2018-03-12 MED ORDER — SODIUM CHLORIDE 0.9 % IV SOLN
INTRAVENOUS | Status: AC
  Administered 2018-03-13 (×2): via INTRAVENOUS

## 2018-03-12 MED ORDER — ONDANSETRON HCL 4 MG PO TABS: 4.0000 mg | ORAL_TABLET | Freq: Four times a day (QID) | ORAL | Status: DC | PRN

## 2018-03-12 MED ORDER — ACETAMINOPHEN 325 MG PO TABS
650.0000 mg | ORAL_TABLET | Freq: Four times a day (QID) | ORAL | Status: DC | PRN
  Administered 2018-03-13 – 2018-03-14 (×5): 650 mg via ORAL
  Filled 2018-03-12 (×5): qty 2

## 2018-03-12 NOTE — ED Notes (Signed)
Critical lactic acid 3.1. Dr. Lita Mains made aware.

## 2018-03-12 NOTE — H&P (Signed)
Keith Murillo:[id number] DOB: Dec 15, 1950 DOA: 03/12/2018     PCP: [name], FNP   Outpatient Specialists:     Pulmonary  Dr. Lynelle Smoke    Urology Dr. Bernardo Heater from [facility]  Patient arrived to ER on 03/12/18 at 1555  Patient coming from: home Lives   With family    Chief Complaint:  Chief Complaint  Patient presents with  . Chest Pain    HPI: Keith Murillo is a 68 y.o. 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 He was helping his brother in law they were doing some heavy lifting and digging. By the time he got home he was having chills. Tried to take aspirin.   Developed some myalgias and fevers and chills this afternoon on EMS arrival patient was pale and diaphoretic he took 02/09/2023 aspirins and pain started to feel a little bit better but he vomited x1. No associated diarrhea he has had some urinary complaints hematuria and dysuria starting this afternoon as well. Reports he did not had a flu shot this year.  He denies any travel but his daughter traveled to [place] No prior hx of Kidney stones   Regarding pertinent Chronic problems:  Hx of CAD reports had stents in 2009 and 2004   Recently diagnosed with Low grade Prostate cancer was supposed to get MRI done   While in ER: Febrile up to 103.1 respirations up to 28 tachycardic up to 110 blood pressure now 107/66 down from initial 127/70 elevated white blood cell count In sepsis parameters  Trop 0.02  The following Work up has been ordered so far:  Orders Placed This Encounter  Procedures  . Culture, blood (Routine x 2)  . Urine culture  . DG Chest Portable 1 View  . CT ABDOMEN PELVIS W CONTRAST  . Comprehensive metabolic panel  . Lactic acid, plasma  . CBC  with Differential  . Urinalysis, Routine w reflex microscopic  . Lipase, blood  . Influenza panel by PCR (type A & B)  . Protime-INR  . CBC with Differential/Platelet  . Lactic acid, plasma  . Notify Physician if pt is possible Sepsis patient  . Document Actual / Estimated Weight  . Insert / maintain saline lock  . Consult for [facility] Admission  . I-Stat Troponin, ED (not at [facility])  . ED EKG  . EKG 12-Lead    Following Medications were ordered in ER: Medications  cefTRIAXone (ROCEPHIN) 1 g in sodium chloride 0.9 % 100 mL IVPB (1 g Intravenous New Bag/Given 03/12/18 1949)  sodium chloride 0.9 % bolus 500 mL (500 mLs Intravenous New Bag/Given 03/12/18 1657)  acetaminophen (TYLENOL) tablet 650 mg (650 mg Oral Given 03/12/18 1731)  sodium chloride 0.9 % bolus 500 mL (500 mLs Intravenous New Bag/Given 03/12/18 1927)  iohexol (OMNIPAQUE) 300 MG/ML solution 100 mL (100 mLs Intravenous Contrast Given 03/12/18 1822)    Significant initial  Findings: Abnormal Labs Reviewed  COMPREHENSIVE METABOLIC PANEL - Abnormal; Notable for the following components:      Result Value   CO2 21 (*)    Glucose, Bld 133 (*)    Total Protein 5.9 (*)    AST 42 (*)    ALT 46 (*)    All other components within  normal limits  LACTIC ACID, PLASMA - Abnormal; Notable for the following components:   Lactic Acid, Venous 3.1 (*)    All other components within normal limits  URINALYSIS, ROUTINE W REFLEX MICROSCOPIC - Abnormal; Notable for the following components:   APPearance HAZY (*)    Hgb urine dipstick LARGE (*)    Protein, ur 30 (*)    Leukocytes,Ua TRACE (*)    RBC / HPF >50 (*)    Bacteria, UA FEW (*)    All other components within normal limits  CBC WITH DIFFERENTIAL/PLATELET - Abnormal; Notable for the following components:   WBC 11.0 (*)    Neutro Abs 10.4 (*)    Lymphs Abs 0.4 (*)    Abs Immature Granulocytes 0.08 (*)    All other components within normal limits    Lactic Acid, Venous      Component Value Date/Time   LATICACIDVEN 3.1 (HH) 03/12/2018 1634    Na 139 K 3.6 Elevated LFT AST 42 ALT 46 Lipase 35  Influenza negative  Lactic acid 3.1  Cr   Up from baseline see below Lab Results  Component Value Date   CREATININE 1.23 03/12/2018   CREATININE 0.99 04/03/2017   CREATININE 0.90 10/31/2016    Lactic Acid 3.1  WBC  11  HG/HCT   stable,      Component Value Date/Time   HGB 14.9 03/12/2018 1830   HCT 44.6 03/12/2018 1830   HCT 48.5 05/29/2017 0915    Troponin (Point of Care Test) Recent Labs    03/12/18 1657  TROPIPOC 0.02     BNP (last 3 results) No results for input(s): BNP in the last 8760 hours.  ProBNP (last 3 results) No results for input(s): PROBNP in the last 8760 hours.   UA hematuria   CXR -  NON acute  CTabd/pelvis -  cystitis  ECG:  Personally reviewed by me showing: HR : 116 Rhythm:  Sinus tachycardia    no evidence of ischemic changes QTC 480     ED Triage Vitals  Enc Vitals Group     BP 03/12/18 1651 127/70     Pulse Rate 03/12/18 1651 (!) 109     Resp 03/12/18 1651 (!) 23     Temp 03/12/18 1606 (!) 103.1 F (39.5 C)     Temp Source 03/12/18 1606 Oral     SpO2 03/12/18 1601 95 %     Weight 03/12/18 1607 232 lb (105.2 kg)     Height 03/12/18 1607 5\' 7"  (1.702 m)     Head Circumference --      Peak Flow --      Pain Score 03/12/18 1607 8     Pain Loc --      Pain Edu? --      Excl. in [place]? --   TMAX(24)@       Latest  Blood pressure 107/66, pulse 96, temperature 100.1 F (37.8 C), temperature source Oral, resp. rate (!) 28, height 5\' 7"  (1.702 m), weight 105.2 kg, SpO2 96 %.    Hospitalist was called for admission for Sepsis   Review of Systems:    Pertinent positives include:  Fevers, chills, fatigue,  Constitutional:  No weight loss, night sweats weight loss  HEENT:  No headaches, Difficulty swallowing,Tooth/dental problems,Sore throat,  No sneezing, itching, ear ache, nasal congestion, post  nasal drip,  Cardio-vascular:  No chest pain, Orthopnea, PND, anasarca, dizziness, palpitations.no Bilateral lower extremity swelling  GI:  No heartburn, indigestion,  abdominal pain, nausea, vomiting, diarrhea, change in bowel habits, loss of appetite, melena, blood in stool, hematemesis Resp:  no shortness of breath at rest. No dyspnea on exertion, No excess mucus, no productive cough, No non-productive cough, No coughing up of blood.No change in color of mucus.No wheezing. Skin:  no rash or lesions. No jaundice GU:  no dysuria, change in color of urine, no urgency or frequency. No straining to urinate.  No flank pain.  Musculoskeletal:  No joint pain or no joint swelling. No decreased range of motion. No back pain.  Psych:  No change in mood or affect. No depression or anxiety. No memory loss.  Neuro: no localizing neurological complaints, no tingling, no weakness, no double vision, no gait abnormality, no slurred speech, no confusion  All systems reviewed and apart from [place] all are negative  Past Medical History:   Past Medical History:  Diagnosis Date  . Chest pain   . Heart attack ([place])   . Hypercholesterolemia   . Hyperlipidemia   . Ischemic heart disease September 2004   Known--with non-Q-wave myocardial infarction   . Obesity   . Sleep apnea       Past Surgical History:  Procedure Laterality Date  . CARDIAC CATHETERIZATION  11/2007    Social History:  Ambulatory  Independently      reports that he has never smoked. He has never used smokeless tobacco. He reports current alcohol use. He reports that he does not use drugs.  Family History:  Family History  Problem Relation Age of Onset  . Cancer Mother   . Hearing loss Father   . Cancer Maternal Grandmother   . Parkinson's disease Maternal Grandfather   . Stroke Maternal Grandfather   . Stroke Paternal Grandmother   . Heart attack Paternal Grandfather     Allergies: No Known Allergies   Prior to  Admission medications   Medication Sig Start Date End Date Taking? Authorizing Provider  aspirin 325 MG tablet Take 325 mg by mouth daily.    [provider]  AZO-CRANBERRY PO Take by mouth.    [provider]  B Complex Vitamins (B COMPLEX PO) Take by mouth daily.    [provider]  Bioflavonoid Products (GRAPE SEED PO) Take by mouth daily.    [provider]  Biotin 5000 MCG CAPS Take by mouth.    [provider]  DHEA 25 MG CAPS Take by mouth.    [provider]  GLUCOSAMINE HCL PO Take by mouth.    [provider]  L-Arginine 1000 MG TABS Take by mouth.    [provider]  lisinopril (PRINIVIL,ZESTRIL) 10 MG tablet TAKE ONE TABLET BY MOUTH ONE TIME DAILY  11/09/17   [name], FNP  sildenafil (REVATIO) 20 MG tablet 2-5 tabs 1 hour prior to intercourse 08/26/17   Stoioff, Ronda Fairly, MD  vitamin C (ASCORBIC ACID) 500 MG tablet Take 500 mg by mouth daily.    [provider]   Physical Exam: Blood pressure 107/66, pulse 96, temperature 100.1 F (37.8 C), temperature source Oral, resp. rate (!) 28, height 5\' 7"  (1.702 m), weight 105.2 kg, SpO2 96 %. 1. General:  in No Acute distress   acutely ill -appearing 2. Psychological: Alert and  Oriented 3. Head/ENT:    Dry Mucous Membranes                          Head Non traumatic,  neck supple                          Poor Dentition 4. SKIN:  decreased Skin turgor,  Skin clean Dry and intact no rash 5. Heart: Regular rate and rhythm no Murmur, no Rub or gallop 6. Lungs:   Clear to auscultation bilaterally, no wheezes or crackles   7. Abdomen: Soft,  non-tender, Non distended  Obese increased bowel sounds present 8. Lower extremities: no clubbing, cyanosis, no  edema 9. Neurologically Grossly intact, moving all 4 extremities equally   10. MSK: Normal range of motion   LABS:     Recent Labs  Lab 03/12/18 1830  WBC 11.0*  NEUTROABS 10.4*  HGB 14.9    HCT 44.6  MCV 88.0  PLT 053   Basic Metabolic Panel: Recent Labs  Lab 03/12/18 1655  NA 139  K 3.6  CL 107  CO2 21*  GLUCOSE 133*  BUN 15  CREATININE 1.23  CALCIUM 9.3      Recent Labs  Lab 03/12/18 1655  AST 42*  ALT 46*  ALKPHOS 60  BILITOT 1.2  PROT 5.9*  ALBUMIN 3.5   Recent Labs  Lab 03/12/18 1655  LIPASE 35   No results for input(s): AMMONIA in the last 168 hours.    HbA1C: No results for input(s): HGBA1C in the last 72 hours. CBG: No results for input(s): GLUCAP in the last 168 hours.    Urine analysis:    Component Value Date/Time   COLORURINE YELLOW 03/12/2018 1656   APPEARANCEUR HAZY (A) 03/12/2018 1656   LABSPEC 1.010 03/12/2018 1656   PHURINE 6.0 03/12/2018 1656   GLUCOSEU NEGATIVE 03/12/2018 1656   HGBUR LARGE (A) 03/12/2018 1656   BILIRUBINUR NEGATIVE 03/12/2018 1656   KETONESUR NEGATIVE 03/12/2018 1656   PROTEINUR 30 (A) 03/12/2018 1656   NITRITE NEGATIVE 03/12/2018 1656   LEUKOCYTESUR TRACE (A) 03/12/2018 1656    Cultures: No results found for: SDES, SPECREQUEST, CULT, REPTSTATUS   Radiological Exams on Admission: Ct Abdomen Pelvis W Contrast  Result Date: 03/12/2018 CLINICAL DATA:  Left-sided chest pain.  Fever and vomiting. EXAM: CT ABDOMEN AND PELVIS WITH CONTRAST TECHNIQUE: Multidetector CT imaging of the abdomen and pelvis was performed using the standard protocol following bolus administration of intravenous contrast. CONTRAST:  154mL OMNIPAQUE IOHEXOL 300 MG/ML  SOLN COMPARISON:  None. FINDINGS: Lower chest: Top normal heart size without pericardial effusion. At least three-vessel coronary arteriosclerosis is noted. Dependent bibasilar atelectasis is seen. Hepatobiliary: Steatosis of the liver. Gallbladder is unremarkable and free of stones. No biliary dilatation. No hepatic mass is identified. Pancreas: Normal Spleen: Normal Adrenals/Urinary Tract: Normal bilateral adrenal glands, kidneys and ureters without evidence of  obstructive uropathy. Asymmetric eccentric thickening of the anterior right lateral wall of the urinary bladder in part due to infolding of the bladder. Focal cystitis or a subtle mucosal bladder lesion is not entirely excluded. Consider direct visual correlation. Focal inflammatory change or mucosal lesion is not entirely excluded. Stomach/Bowel: Small hiatal hernia. Decompressed stomach. Normal small bowel rotation. Normal appearing appendix. Average stool retention within the colon without bowel obstruction or inflammation. Vascular/Lymphatic: Nonaneurysmal atherosclerotic aorta. No lymphadenopathy. Reproductive: On the large prostate with central zone calcifications. Other: Fat containing inguinal hernia.  No free air nor free fluid. Musculoskeletal: Degenerative disc disease of the lower lumbar spine. Multilevel mild facet arthropathy and hypertrophy. IMPRESSION: 1. Hepatic steatosis. 2. Focal right-sided anterior bladder thickening in part due to infolding  of the bladder. A mucosal lesion or focal cystitis are among differential possibilities. Consider direct visual correlation as deemed clinically warranted or repeat imaging with better bladder distension and opacification of the urine. 3. Coronary arteriosclerosis. 4. Fat containing right inguinal hernia. Electronically Signed   By: [name] M.D.   On: 03/12/2018 19:23   Dg Chest Portable 1 View  Result Date: 03/12/2018 CLINICAL DATA:  LEFT chest pain, shortness of breath and vomiting today, blood in urine, body aches EXAM: PORTABLE CHEST 1 VIEW COMPARISON:  Portable exam 1616 hours without priors for comparison FINDINGS: Upper normal heart size. Mediastinal contours and pulmonary vascularity normal. Lungs clear. No infiltrate, pleural effusion or pneumothorax. Bones unremarkable. IMPRESSION: No acute abnormalities. Electronically Signed   By: Lavonia Dana M.D.   On: 03/12/2018 16:48    Chart has been reviewed    Assessment/Plan  68 y.o. male  with medical history significant of Prostate Cancer, Erectile Dysfunction, CAD status post multiple stent placement, HLD, OSA Admitted for SIRS/SEPSIS  Present on Admission: . Chest pain -currently resolved in the setting of acute illness.  And sepsis.  We will continue to cycle cardiac enzymes patient will likely benefit from further cardiac evaluation please consult cardiology in the morning.  Given history of CAD and symptoms similar to cardiac etiology  Monitor on telemetry and obtain echo . Coronary artery disease involving native heart -will need further work-up given exertional chest pain.  Continue to observe on telemetry and cycle cardiac enzymes check lipid panel in the morning . Hyperlipidemia -check lipid panel in a.m. Marland Kitchen Sepsis (HCC)-Sirs etiology unclear could be urinary.  Possibly patient passed a stone given straining and hematuria that she currently has improved.  Also evaluate for viral etiology.  Chest x-ray unremarkable.  For now order blood cultures and urine cultures broad spectrum antibiotics. When hypertension elevated lactic acid will monitor in stepdown administer IV fluids and fluid resuscitate   Other plan as per orders.  DVT prophylaxis:  SCD   Code Status:  FULL CODE  as per patient   I had personally discussed CODE STATUS with patient    Family Communication:   Family not  at  Bedside    Disposition Plan:   To home once workup is complete and patient is stable                                     Consults called:   need cardiology consult in AM   Admission status:  Obs    Level of care         SDU tele indefinitely please discontinue once patient no longer qualifies      Ellisyn Icenhower 03/12/2018, 9:10 PM    [facility]     after 2 AM please page floor coverage PA If 7AM-7PM, please contact the day team taking care of the patient using [email or web address]

## 2018-03-12 NOTE — ED Triage Notes (Signed)
Pt arrived by [facility] with reports of left sided chest pain. When EMS arrived pt was pale and diaphoretic. Pt reports taking 2 325mg  aspirin and pain subsided but began to vomit X1. Pt has his of 2004 MI with multiple stent placement.

## 2018-03-12 NOTE — ED Notes (Signed)
Lab running CBC now.

## 2018-03-12 NOTE — ED Provider Notes (Signed)
[facility] EMERGENCY DEPARTMENT Provider Note   CSN: [id number] Arrival date & time: 03/12/18  1555    History   Chief Complaint Chief Complaint  Patient presents with  . Chest Pain    HPI Keith Murillo is a 68 y.o. male.     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  Developed diffuse myalgias and fevers and chills this afternoon.  Around 1 PM developed left-sided chest pressure that did not radiate.  States he vomited once.  Has had no diarrhea.  Has had urinary hesitancy, dysuria and gross hematuria this afternoon.  Did not get a flu shot this year.  No new lower extremity swelling or pain.  No new rashes.  No recent travel.  Patient does admit to some mild nasal congestion and nonproductive cough. Past Medical History:  Diagnosis Date  . Chest pain   . Heart attack ([place])   . Hypercholesterolemia   . Hyperlipidemia   . Ischemic heart disease September 2004   Known--with non-Q-wave myocardial infarction   . Obesity   . Sleep apnea     Patient Active Problem List   Diagnosis Date Noted  . Sepsis ([place]) 03/12/2018  . Chest pain 03/12/2018  . Prostate cancer ([place]) 08/03/2017  . Chronic venous insufficiency 07/20/2017  . Lymphedema 07/20/2017  . Hyperlipidemia 07/17/2017  . Varicose veins of both lower extremities with complications 16/01/930  . Coronary artery disease involving native heart without angina pectoris 01/30/2017  . Hypogonadism in male 12/05/2016    Past Surgical History:  Procedure Laterality Date  . CARDIAC CATHETERIZATION  11/2007        Home Medications    Prior to Admission medications   Medication Sig Start Date End Date Taking? Authorizing Provider  aspirin 325 MG tablet Take 325 mg by mouth daily as needed (pain).    Yes [provider]  Cholecalciferol (VITAMIN D-3) 125 MCG (5000 UT) TABS Take 10,000 Units by mouth daily.   Yes  [provider]  CRANBERRY PO Take 1 capsule by mouth daily.   Yes [provider]  Cyanocobalamin (VITAMIN B-12 PO) Take 1 tablet by mouth daily.   Yes [provider]  DHEA 25 MG CAPS Take 25 mg by mouth daily.    Yes [provider]  Omega-3 Fatty Acids (OMEGA 3 PO) Take 1 capsule by mouth daily.   Yes [provider]  OVER THE COUNTER MEDICATION Take 1,300 mg by mouth See admin instructions. [place] grape capsules - 650 mg each - take 2 capsules by mouth daily   Yes [provider]  OVER THE COUNTER MEDICATION Take 1 capsule by mouth daily. EDTA   Yes [provider]  sildenafil (REVATIO) 20 MG tablet 2-5 tabs 1 hour prior to intercourse Patient taking differently: Take 60-80 mg by mouth daily as needed (prior to intercourse).  08/26/17  Yes Stoioff, Ronda Fairly, MD  TURMERIC PO Take 1 capsule by mouth daily.   Yes [provider]  vitamin C (ASCORBIC ACID) 500 MG tablet Take 500 mg by mouth daily.   Yes [provider]  lisinopril (PRINIVIL,ZESTRIL) 10 MG tablet TAKE ONE TABLET BY MOUTH ONE TIME DAILY  Patient not taking: Reported on 03/12/2018 11/09/17   [name], FNP    Family History Family History  Problem Relation Age of Onset  . Cancer Mother   . Hearing loss Father   .  Cancer Maternal Grandmother   . Parkinson's disease Maternal Grandfather   . Stroke Maternal Grandfather   . Stroke Paternal Grandmother   . Heart attack Paternal Grandfather     Social History Social History   Tobacco Use  . Smoking status: Never Smoker  . Smokeless tobacco: Never Used  Substance Use Topics  . Alcohol use: Yes    Comment: occas  . Drug use: No     Allergies   Lisinopril   Review of Systems Review of Systems  Constitutional: Positive for chills, diaphoresis, fatigue and fever.  HENT: Positive for congestion. Negative for sore throat and trouble swallowing.   Eyes: Negative for visual  disturbance.  Respiratory: Positive for cough and chest tightness. Negative for shortness of breath.   Cardiovascular: Negative for palpitations and leg swelling.  Gastrointestinal: Positive for nausea and vomiting. Negative for abdominal pain, blood in stool and diarrhea.  Genitourinary: Positive for difficulty urinating, dysuria and hematuria. Negative for flank pain.  Musculoskeletal: Positive for back pain and myalgias. Negative for neck pain and neck stiffness.  Skin: Negative for rash and wound.  Neurological: Negative for dizziness, weakness, light-headedness, numbness and headaches.  All other systems reviewed and are negative.    Physical Exam Updated Vital Signs BP 136/64 (BP Location: Right Arm)   Pulse (!) 105   Temp 98.3 F (36.8 C) (Oral)   Resp (!) 22   Ht 5\' 7"  (1.702 m)   Wt 107.4 kg   SpO2 98%   BMI 37.08 kg/m   Physical Exam Vitals signs and nursing note reviewed.  Constitutional:      General: He is not in acute distress.    Appearance: He is well-developed.  HENT:     Head: Normocephalic and atraumatic.     Nose: Nose normal.     Mouth/Throat:     Mouth: Mucous membranes are moist.     Pharynx: No oropharyngeal exudate or posterior oropharyngeal erythema.  Eyes:     Extraocular Movements: Extraocular movements intact.     Conjunctiva/sclera: Conjunctivae normal.     Pupils: Pupils are equal, round, and reactive to light.  Neck:     Musculoskeletal: Normal range of motion and neck supple. No neck rigidity or muscular tenderness.     Vascular: No carotid bruit.     Comments: No meningismus Cardiovascular:     Rate and Rhythm: Normal rate and regular rhythm.     Heart sounds: No murmur. No friction rub. No gallop.   Pulmonary:     Effort: Pulmonary effort is normal. No respiratory distress.     Breath sounds: Normal breath sounds. No stridor. No wheezing, rhonchi or rales.  Chest:     Chest wall: No tenderness.  Abdominal:     General: Bowel  sounds are normal. There is no distension.     Palpations: Abdomen is soft. There is no mass.     Tenderness: There is abdominal tenderness. There is no right CVA tenderness, left CVA tenderness, guarding or rebound.     Hernia: No hernia is present.     Comments: Mild right upper quadrant and right lower quadrant tenderness to palpation.  No rebound or guarding.  No CVA tenderness bilaterally.  Musculoskeletal: Normal range of motion.        General: Tenderness present. No swelling, deformity or signs of injury.     Right lower leg: No edema.     Left lower leg: No edema.     Comments: Patient has  mild inferior thoracic/superior lumbar midline tenderness to palpation.  There is no step-off or deformity.  Lymphadenopathy:     Cervical: No cervical adenopathy.  Skin:    General: Skin is warm and dry.     Capillary Refill: Capillary refill takes less than 2 seconds.     Findings: No erythema or rash.  Neurological:     General: No focal deficit present.     Mental Status: He is alert and oriented to person, place, and time.     Comments: Moving all extremities without focal deficit.  Sensation fully intact.  Psychiatric:        Mood and Affect: Mood normal.        Behavior: Behavior normal.      ED Treatments / Results  Labs (all labs ordered are listed, but only abnormal results are displayed) Labs Reviewed  COMPREHENSIVE METABOLIC PANEL - Abnormal; Notable for the following components:      Result Value   CO2 21 (*)    Glucose, Bld 133 (*)    Total Protein 5.9 (*)    AST 42 (*)    ALT 46 (*)    All other components within normal limits  LACTIC ACID, PLASMA - Abnormal; Notable for the following components:   Lactic Acid, Venous 3.1 (*)    All other components within normal limits  URINALYSIS, ROUTINE W REFLEX MICROSCOPIC - Abnormal; Notable for the following components:   APPearance HAZY (*)    Hgb urine dipstick LARGE (*)    Protein, ur 30 (*)    Leukocytes,Ua TRACE (*)     RBC / HPF >50 (*)    Bacteria, UA FEW (*)    All other components within normal limits  CBC WITH DIFFERENTIAL/PLATELET - Abnormal; Notable for the following components:   WBC 11.0 (*)    Neutro Abs 10.4 (*)    Lymphs Abs 0.4 (*)    Abs Immature Granulocytes 0.08 (*)    All other components within normal limits  LACTIC ACID, PLASMA - Abnormal; Notable for the following components:   Lactic Acid, Venous 2.5 (*)    All other components within normal limits  LACTIC ACID, PLASMA - Abnormal; Notable for the following components:   Lactic Acid, Venous 2.3 (*)    All other components within normal limits  RESPIRATORY PANEL BY PCR  CULTURE, BLOOD (ROUTINE X 2)  CULTURE, BLOOD (ROUTINE X 2)  URINE CULTURE  LIPASE, BLOOD  INFLUENZA PANEL BY PCR (TYPE A & B)  PROTIME-INR  CK  TROPONIN I  PROCALCITONIN  LACTIC ACID, PLASMA  CBC WITH DIFFERENTIAL/PLATELET  TROPONIN I  TROPONIN I  LACTIC ACID, PLASMA  LACTIC ACID, PLASMA  LIPID PANEL  HIV ANTIBODY (ROUTINE TESTING W REFLEX)  MAGNESIUM  PHOSPHORUS  TSH  COMPREHENSIVE METABOLIC PANEL  CBC  I-STAT TROPONIN, ED    EKG EKG Interpretation  Date/Time:  Friday March 12 2018 16:34:27 EST Ventricular Rate:  116 PR Interval:    QRS Duration: 96 QT Interval:  345 QTC Calculation: 480 R Axis:   51 Text Interpretation:  Sinus tachycardia RSR' in V1 or V2, right VCD or RVH Borderline prolonged QT interval Confirmed by Julianne Rice [id number]) on 03/12/2018 4:40:03 PM   Radiology Ct Abdomen Pelvis W Contrast  Result Date: 03/12/2018 CLINICAL DATA:  Left-sided chest pain.  Fever and vomiting. EXAM: CT ABDOMEN AND PELVIS WITH CONTRAST TECHNIQUE: Multidetector CT imaging of the abdomen and pelvis was performed using the standard protocol following bolus administration  of intravenous contrast. CONTRAST:  198mL OMNIPAQUE IOHEXOL 300 MG/ML  SOLN COMPARISON:  None. FINDINGS: Lower chest: Top normal heart size without pericardial effusion. At least  three-vessel coronary arteriosclerosis is noted. Dependent bibasilar atelectasis is seen. Hepatobiliary: Steatosis of the liver. Gallbladder is unremarkable and free of stones. No biliary dilatation. No hepatic mass is identified. Pancreas: Normal Spleen: Normal Adrenals/Urinary Tract: Normal bilateral adrenal glands, kidneys and ureters without evidence of obstructive uropathy. Asymmetric eccentric thickening of the anterior right lateral wall of the urinary bladder in part due to infolding of the bladder. Focal cystitis or a subtle mucosal bladder lesion is not entirely excluded. Consider direct visual correlation. Focal inflammatory change or mucosal lesion is not entirely excluded. Stomach/Bowel: Small hiatal hernia. Decompressed stomach. Normal small bowel rotation. Normal appearing appendix. Average stool retention within the colon without bowel obstruction or inflammation. Vascular/Lymphatic: Nonaneurysmal atherosclerotic aorta. No lymphadenopathy. Reproductive: On the large prostate with central zone calcifications. Other: Fat containing inguinal hernia.  No free air nor free fluid. Musculoskeletal: Degenerative disc disease of the lower lumbar spine. Multilevel mild facet arthropathy and hypertrophy. IMPRESSION: 1. Hepatic steatosis. 2. Focal right-sided anterior bladder thickening in part due to infolding of the bladder. A mucosal lesion or focal cystitis are among differential possibilities. Consider direct visual correlation as deemed clinically warranted or repeat imaging with better bladder distension and opacification of the urine. 3. Coronary arteriosclerosis. 4. Fat containing right inguinal hernia. Electronically Signed   By: [name] M.D.   On: 03/12/2018 19:23   Dg Chest Portable 1 View  Result Date: 03/12/2018 CLINICAL DATA:  LEFT chest pain, shortness of breath and vomiting today, blood in urine, body aches EXAM: PORTABLE CHEST 1 VIEW COMPARISON:  Portable exam 1616 hours without priors  for comparison FINDINGS: Upper normal heart size. Mediastinal contours and pulmonary vascularity normal. Lungs clear. No infiltrate, pleural effusion or pneumothorax. Bones unremarkable. IMPRESSION: No acute abnormalities. Electronically Signed   By: Lavonia Dana M.D.   On: 03/12/2018 16:48    Procedures Procedures (including critical care time)  Medications Ordered in ED Medications  metroNIDAZOLE (FLAGYL) IVPB 500 mg (0 mg Intravenous Stopped 03/12/18 2237)  ceFEPIme (MAXIPIME) 2 g in sodium chloride 0.9 % 100 mL IVPB (0 g Intravenous Stopped 03/12/18 2146)  vancomycin (VANCOCIN) 2,000 mg in sodium chloride 0.9 % 500 mL IVPB (2,000 mg Intravenous New Bag/Given 03/12/18 2248)  vancomycin (VANCOCIN) IVPB 750 mg/150 ml premix (has no administration in time range)  acetaminophen (TYLENOL) tablet 650 mg (has no administration in time range)    Or  acetaminophen (TYLENOL) suppository 650 mg (has no administration in time range)  HYDROcodone-acetaminophen (NORCO/VICODIN) 5-325 MG per tablet 1-2 tablet (has no administration in time range)  ondansetron (ZOFRAN) tablet 4 mg (has no administration in time range)    Or  ondansetron (ZOFRAN) injection 4 mg (has no administration in time range)  0.9 %  sodium chloride infusion (has no administration in time range)  aspirin chewable tablet 81 mg (has no administration in time range)  sodium chloride 0.9 % bolus 500 mL (0 mLs Intravenous Stopped 03/12/18 1900)  acetaminophen (TYLENOL) tablet 650 mg (650 mg Oral Given 03/12/18 1731)  sodium chloride 0.9 % bolus 500 mL (0 mLs Intravenous Stopped 03/12/18 2027)  iohexol (OMNIPAQUE) 300 MG/ML solution 100 mL (100 mLs Intravenous Contrast Given 03/12/18 1822)  cefTRIAXone (ROCEPHIN) 1 g in sodium chloride 0.9 % 100 mL IVPB (0 g Intravenous Stopped 03/12/18 2019)  sodium chloride 0.9 % bolus 500  mL (0 mLs Intravenous Stopped 03/12/18 2315)  sodium chloride 0.9 % bolus 500 mL (500 mLs Intravenous New Bag/Given 03/12/18 2344)      Initial Impression / Assessment and Plan / ED Course  I have reviewed the triage vital signs and the nursing notes.  Pertinent labs & imaging results that were available during my care of the patient were reviewed by me and considered in my medical decision making (see chart for details).       Patient presents with febrile illness and hematuria.  Patient with fever of 103 at presentation.  CT abdomen and pelvis obtained.  Patient does have some bladder wall thickening on CT.  Negative influenza.  Chest x-ray without acute findings.  Initial elevation in lactic acid improved with IV fluids.  Initiated antibiotics and discussed with hospitalist.  Final Clinical Impressions(s) / ED Diagnoses   Final diagnoses:  Sepsis without acute organ dysfunction, due to unspecified organism [facility])    ED Discharge Orders    None       Julianne Rice, MD 03/13/18 [phone]

## 2018-03-12 NOTE — ED Notes (Signed)
Nurse will collect labs. 

## 2018-03-12 NOTE — ED Notes (Signed)
  Attempted to call report.  RN will call me back. 

## 2018-03-12 NOTE — Progress Notes (Signed)
Pharmacy Antibiotic Note  Keith Murillo is a 68 y.o. male admitted on 03/12/2018 with sepsis.  Pharmacy has been consulted for vancomycin and cefepime dosing. Tmax 103.1 on admission, WBC elevated at 11, elevated lactic acid of 2.5. Scr 1.23.  Plan: Cefepime 2g IV q8h Vancomycin 2000 mg IV x 1, then vancomycin 750 mg IV q8h (expected AUC 486, Cmax ss 25.6, Cmin ss 16.7 using Scr 1.23) Obtain vancomycin peak and trough at steady state Monitor CBC and Scr F/u C&S, de-escalation plans, and LOT  Height: 5\' 7"  (170.2 cm) Weight: 232 lb (105.2 kg) IBW/kg (Calculated) : 66.1  Temp (24hrs), Avg:101.6 F (38.7 C), Min:100.1 F (37.8 C), Max:103.1 F (39.5 C)  Recent Labs  Lab 03/12/18 1634 03/12/18 1655 03/12/18 1830 03/12/18 1929  WBC  --   --  11.0*  --   CREATININE  --  1.23  --   --   LATICACIDVEN 3.1*  --   --  2.5*    Estimated Creatinine Clearance: 68.3 mL/min (by C-G formula based on SCr of 1.23 mg/dL).    No Known Allergies  Antimicrobials this admission: Vancomycin 3/6 >>  Cefepime 3/6 >>  Metronidazole 3/6 >>  Ceftriaxone 3/6 x 1  Dose adjustments this admission: N/A  Microbiology results: 3/6 BCx:  3/6 UCx:    Thank you for allowing pharmacy to be a part of this patient's care.  Leron Croak, PharmD PGY1 Pharmacy Resident  Please check AMION for all [facility] Pharmacy phone numbers 03/12/2018 8:25 PM

## 2018-03-13 ENCOUNTER — Other Ambulatory Visit: Payer: Self-pay

## 2018-03-13 ENCOUNTER — Observation Stay (HOSPITAL_COMMUNITY): Payer: Medicare Other

## 2018-03-13 DIAGNOSIS — Z820 Family history of epilepsy and other diseases of the nervous system: Secondary | ICD-10-CM | POA: Diagnosis not present

## 2018-03-13 DIAGNOSIS — C61 Malignant neoplasm of prostate: Secondary | ICD-10-CM | POA: Diagnosis not present

## 2018-03-13 DIAGNOSIS — R7881 Bacteremia: Secondary | ICD-10-CM

## 2018-03-13 DIAGNOSIS — Z8249 Family history of ischemic heart disease and other diseases of the circulatory system: Secondary | ICD-10-CM | POA: Diagnosis not present

## 2018-03-13 DIAGNOSIS — Z809 Family history of malignant neoplasm, unspecified: Secondary | ICD-10-CM | POA: Diagnosis not present

## 2018-03-13 DIAGNOSIS — R319 Hematuria, unspecified: Secondary | ICD-10-CM | POA: Diagnosis not present

## 2018-03-13 DIAGNOSIS — E7800 Pure hypercholesterolemia, unspecified: Secondary | ICD-10-CM | POA: Diagnosis present

## 2018-03-13 DIAGNOSIS — Z7982 Long term (current) use of aspirin: Secondary | ICD-10-CM | POA: Diagnosis not present

## 2018-03-13 DIAGNOSIS — E669 Obesity, unspecified: Secondary | ICD-10-CM | POA: Diagnosis not present

## 2018-03-13 DIAGNOSIS — I10 Essential (primary) hypertension: Secondary | ICD-10-CM | POA: Diagnosis present

## 2018-03-13 DIAGNOSIS — B952 Enterococcus as the cause of diseases classified elsewhere: Secondary | ICD-10-CM | POA: Diagnosis not present

## 2018-03-13 DIAGNOSIS — A411 Sepsis due to other specified staphylococcus: Secondary | ICD-10-CM | POA: Diagnosis not present

## 2018-03-13 DIAGNOSIS — B9561 Methicillin susceptible Staphylococcus aureus infection as the cause of diseases classified elsewhere: Secondary | ICD-10-CM | POA: Diagnosis not present

## 2018-03-13 DIAGNOSIS — A4181 Sepsis due to Enterococcus: Secondary | ICD-10-CM | POA: Diagnosis not present

## 2018-03-13 DIAGNOSIS — A408 Other streptococcal sepsis: Secondary | ICD-10-CM | POA: Diagnosis not present

## 2018-03-13 DIAGNOSIS — K760 Fatty (change of) liver, not elsewhere classified: Secondary | ICD-10-CM | POA: Diagnosis not present

## 2018-03-13 DIAGNOSIS — E785 Hyperlipidemia, unspecified: Secondary | ICD-10-CM | POA: Diagnosis not present

## 2018-03-13 DIAGNOSIS — R079 Chest pain, unspecified: Secondary | ICD-10-CM

## 2018-03-13 DIAGNOSIS — Z888 Allergy status to other drugs, medicaments and biological substances status: Secondary | ICD-10-CM | POA: Diagnosis not present

## 2018-03-13 DIAGNOSIS — R7989 Other specified abnormal findings of blood chemistry: Secondary | ICD-10-CM

## 2018-03-13 DIAGNOSIS — Z955 Presence of coronary angioplasty implant and graft: Secondary | ICD-10-CM

## 2018-03-13 DIAGNOSIS — Z823 Family history of stroke: Secondary | ICD-10-CM | POA: Diagnosis not present

## 2018-03-13 DIAGNOSIS — Z8551 Personal history of malignant neoplasm of bladder: Secondary | ICD-10-CM

## 2018-03-13 DIAGNOSIS — R6520 Severe sepsis without septic shock: Secondary | ICD-10-CM | POA: Diagnosis not present

## 2018-03-13 DIAGNOSIS — Z6837 Body mass index (BMI) 37.0-37.9, adult: Secondary | ICD-10-CM | POA: Diagnosis not present

## 2018-03-13 DIAGNOSIS — I2510 Atherosclerotic heart disease of native coronary artery without angina pectoris: Secondary | ICD-10-CM

## 2018-03-13 DIAGNOSIS — B957 Other staphylococcus as the cause of diseases classified elsewhere: Secondary | ICD-10-CM | POA: Diagnosis not present

## 2018-03-13 DIAGNOSIS — Z79899 Other long term (current) drug therapy: Secondary | ICD-10-CM | POA: Diagnosis not present

## 2018-03-13 DIAGNOSIS — I340 Nonrheumatic mitral (valve) insufficiency: Secondary | ICD-10-CM | POA: Diagnosis not present

## 2018-03-13 DIAGNOSIS — I252 Old myocardial infarction: Secondary | ICD-10-CM | POA: Diagnosis not present

## 2018-03-13 DIAGNOSIS — I248 Other forms of acute ischemic heart disease: Secondary | ICD-10-CM | POA: Diagnosis not present

## 2018-03-13 DIAGNOSIS — N3091 Cystitis, unspecified with hematuria: Secondary | ICD-10-CM | POA: Diagnosis not present

## 2018-03-13 DIAGNOSIS — A419 Sepsis, unspecified organism: Secondary | ICD-10-CM | POA: Diagnosis not present

## 2018-03-13 DIAGNOSIS — G4733 Obstructive sleep apnea (adult) (pediatric): Secondary | ICD-10-CM | POA: Diagnosis not present

## 2018-03-13 LAB — BLOOD CULTURE ID PANEL (REFLEXED)
Acinetobacter baumannii: NOT DETECTED
CANDIDA GLABRATA: NOT DETECTED
Candida albicans: NOT DETECTED
Candida krusei: NOT DETECTED
Candida parapsilosis: NOT DETECTED
Candida tropicalis: NOT DETECTED
Enterobacter cloacae complex: NOT DETECTED
Enterobacteriaceae species: NOT DETECTED
Enterococcus species: DETECTED — AB
Escherichia coli: NOT DETECTED
Haemophilus influenzae: NOT DETECTED
KLEBSIELLA PNEUMONIAE: NOT DETECTED
Klebsiella oxytoca: NOT DETECTED
Listeria monocytogenes: NOT DETECTED
Methicillin resistance: NOT DETECTED
Neisseria meningitidis: NOT DETECTED
Proteus species: NOT DETECTED
Pseudomonas aeruginosa: NOT DETECTED
Serratia marcescens: NOT DETECTED
Staphylococcus aureus (BCID): NOT DETECTED
Staphylococcus species: DETECTED — AB
Streptococcus agalactiae: NOT DETECTED
Streptococcus pneumoniae: NOT DETECTED
Streptococcus pyogenes: NOT DETECTED
Streptococcus species: NOT DETECTED
VANCOMYCIN RESISTANCE: NOT DETECTED

## 2018-03-13 LAB — ECHOCARDIOGRAM COMPLETE
Height: 67 in
WEIGHTICAEL: 3788.38 [oz_av]

## 2018-03-13 LAB — LIPID PANEL
Cholesterol: 152 mg/dL (ref 0–200)
HDL: 39 mg/dL — ABNORMAL LOW (ref >40)
LDL Cholesterol: 100 mg/dL — ABNORMAL HIGH (ref 0–99)
Total CHOL/HDL Ratio: 3.9 RATIO
Triglycerides: 66 mg/dL (ref <150)
VLDL: 13 mg/dL (ref 0–40)

## 2018-03-13 LAB — CBC
HCT: 41.8 % (ref 39.0–52.0)
Hemoglobin: 13.7 g/dL (ref 13.0–17.0)
MCH: 29 pg (ref 26.0–34.0)
MCHC: 32.8 g/dL (ref 30.0–36.0)
MCV: 88.6 fL (ref 80.0–100.0)
Platelets: 185 10*3/uL (ref 150–400)
RBC: 4.72 MIL/uL (ref 4.22–5.81)
RDW: 13.1 % (ref 11.5–15.5)
WBC: 13.9 10*3/uL — ABNORMAL HIGH (ref 4.0–10.5)
nRBC: 0 % (ref 0.0–0.2)

## 2018-03-13 LAB — COMPREHENSIVE METABOLIC PANEL
ALBUMIN: 3.3 g/dL — AB (ref 3.5–5.0)
ALT: 43 U/L (ref 0–44)
AST: 39 U/L (ref 15–41)
Alkaline Phosphatase: 49 U/L (ref 38–126)
Anion gap: 9 (ref 5–15)
BUN: 15 mg/dL (ref 8–23)
CO2: 23 mmol/L (ref 22–32)
Calcium: 8.6 mg/dL — ABNORMAL LOW (ref 8.9–10.3)
Chloride: 106 mmol/L (ref 98–111)
Creatinine, Ser: 1.14 mg/dL (ref 0.61–1.24)
GFR calc Af Amer: >60 mL/min (ref >60)
GFR calc non Af Amer: >60 mL/min (ref >60)
Glucose, Bld: 112 mg/dL — ABNORMAL HIGH (ref 70–99)
Potassium: 4 mmol/L (ref 3.5–5.1)
Sodium: 138 mmol/L (ref 135–145)
Total Bilirubin: 1 mg/dL (ref 0.3–1.2)
Total Protein: 6 g/dL — ABNORMAL LOW (ref 6.5–8.1)

## 2018-03-13 LAB — LACTIC ACID, PLASMA
Lactic Acid, Venous: 1.7 mmol/L (ref 0.5–1.9)
Lactic Acid, Venous: 1.3 mmol/L (ref 0.5–1.9)
Lactic Acid, Venous: 1.5 mmol/L (ref 0.5–1.9)

## 2018-03-13 LAB — TROPONIN I
Troponin I: 0.06 ng/mL (ref <0.03)
Troponin I: 0.12 ng/mL (ref <0.03)
Troponin I: 0.29 ng/mL (ref <0.03)

## 2018-03-13 LAB — PHOSPHORUS: Phosphorus: 3.6 mg/dL (ref 2.5–4.6)

## 2018-03-13 LAB — MAGNESIUM: MAGNESIUM: 1.7 mg/dL (ref 1.7–2.4)

## 2018-03-13 LAB — TSH: TSH: 0.833 u[IU]/mL (ref 0.350–4.500)

## 2018-03-13 MED ORDER — SODIUM CHLORIDE 0.9 % IV SOLN
3.0000 g | Freq: Four times a day (QID) | INTRAVENOUS | Status: DC
  Administered 2018-03-13 – 2018-03-17 (×15): 3 g via INTRAVENOUS
  Filled 2018-03-13 (×16): qty 3

## 2018-03-13 MED ORDER — SODIUM CHLORIDE 0.9 % IV SOLN
2.0000 g | Freq: Four times a day (QID) | INTRAVENOUS | Status: DC
  Administered 2018-03-13: 2 g via INTRAVENOUS
  Filled 2018-03-13 (×3): qty 2000

## 2018-03-13 MED ORDER — HEPARIN BOLUS VIA INFUSION
4000.0000 [IU] | Freq: Once | INTRAVENOUS | Status: AC
  Administered 2018-03-13: 4000 [IU] via INTRAVENOUS
  Filled 2018-03-13: qty 4000

## 2018-03-13 MED ORDER — HEPARIN (PORCINE) 25000 UT/250ML-% IV SOLN
1600.0000 [IU]/h | INTRAVENOUS | Status: DC
  Administered 2018-03-13: 1100 [IU]/h via INTRAVENOUS
  Administered 2018-03-14: 1300 [IU]/h via INTRAVENOUS
  Filled 2018-03-13 (×3): qty 250

## 2018-03-13 NOTE — Progress Notes (Signed)
Message sent to Dr. Marcie Bal r/t elevated troponin.

## 2018-03-13 NOTE — Progress Notes (Signed)
ANTICOAGULATION CONSULT NOTE - Initial Consult  Pharmacy Consult for heparin Indication: elevated troponin in setting of sepsis  Allergies  Allergen Reactions  . Lisinopril Cough    Patient Measurements: Height: 5\' 7"  (170.2 cm) Weight: 236 lb 12.4 oz (107.4 kg) IBW/kg (Calculated) : 66.1 Heparin Dosing Weight: 90.1 kg  Vital Signs: Temp: 100.1 F (37.8 C) (03/07 1400) Temp Source: Oral (03/07 1400) BP: 99/59 (03/07 0800) Pulse Rate: 87 (03/07 1400)  Labs: Recent Labs    03/12/18 1655 03/12/18 1750 03/12/18 1830  03/12/18 2021 03/13/18 0354 03/13/18 0908 03/13/18 1432  HGB  --   --  14.9  --   --   --  13.7  --   HCT  --   --  44.6  --   --   --  41.8  --   PLT  --   --  258  --   --   --  185  --   LABPROT  --  14.1  --   --   --   --   --   --   INR  --  1.1  --   --   --   --   --   --   CREATININE 1.23  --   --   --   --   --  1.14  --   CKTOTAL  --   --   --   --  159  --   --   --   TROPONINI  --   --   --    < > <0.03 0.06* 0.12* 0.29*   < > = values in this interval not displayed.    Estimated Creatinine Clearance: 74.5 mL/min (by C-G formula based on SCr of 1.14 mg/dL).   Medical History: Past Medical History:  Diagnosis Date  . Chest pain   . Heart attack ([place])   . Hypercholesterolemia   . Hyperlipidemia   . Ischemic heart disease September 2004   Known--with non-Q-wave myocardial infarction   . Obesity   . Sleep apnea     Medications:  Scheduled:  . aspirin  81 mg Oral Daily    Assessment: 9 yom found to have enterococcal bacteremia, now awaiting TEE to rule out endocarditis. Has hx of CAD with stents. Now found to have increasing troponin (0.02>0.06>0.29) with an EKG yesterday showing sinus tachycardia and no reports of chest pain per nursing.   Hgb 13.7, plt 185. No s/sx of bleeding.   Goal of Therapy:  Heparin level 0.3-0.7 units/ml Monitor platelets by anticoagulation protocol: Yes   Plan:  Give 4000 units bolus x 1 Start  heparin infusion at 1100 units/hr Check anti-Xa level in 6 hours and daily while on heparin Continue to monitor H&H and platelets  F/u plans for heparin - would limit to 48 hours max if no further cardiology work-up being pursued   [name], PharmD, [place] Clinical Pharmacist  Pager: [phone] Phone: [phone] 03/13/2018,6:03 PM

## 2018-03-13 NOTE — Plan of Care (Signed)
Care plans reviewed and patient is progressing.  

## 2018-03-13 NOTE — Progress Notes (Signed)
PHARMACY - PHYSICIAN COMMUNICATION CRITICAL VALUE ALERT - BLOOD CULTURE IDENTIFICATION (BCID)  Keith Murillo is an 68 y.o. male who presented to [facility] on 03/12/2018   Assessment: 2/2 blood cx with enterococcus and staph species  Name of physician (or Provider) Contacted: Dr Marthenia Rolling  Current antibiotics: cefepime flagyl vanc  Changes to prescribed antibiotics recommended:  Dc cefepime flagyl Amp 2 g q6 Continue vanc Auto ID Consult  Results for orders placed or performed during the hospital encounter of 03/12/18  Blood Culture ID Panel (Reflexed) (Collected: 03/12/2018  4:34 PM)  Result Value Ref Range   Enterococcus species DETECTED (A) NOT DETECTED   Vancomycin resistance NOT DETECTED NOT DETECTED   Listeria monocytogenes NOT DETECTED NOT DETECTED   Staphylococcus species DETECTED (A) NOT DETECTED   Staphylococcus aureus (BCID) NOT DETECTED NOT DETECTED   Methicillin resistance NOT DETECTED NOT DETECTED   Streptococcus species NOT DETECTED NOT DETECTED   Streptococcus agalactiae NOT DETECTED NOT DETECTED   Streptococcus pneumoniae NOT DETECTED NOT DETECTED   Streptococcus pyogenes NOT DETECTED NOT DETECTED   Acinetobacter baumannii NOT DETECTED NOT DETECTED   Enterobacteriaceae species NOT DETECTED NOT DETECTED   Enterobacter cloacae complex NOT DETECTED NOT DETECTED   Escherichia coli NOT DETECTED NOT DETECTED   Klebsiella oxytoca NOT DETECTED NOT DETECTED   Klebsiella pneumoniae NOT DETECTED NOT DETECTED   Proteus species NOT DETECTED NOT DETECTED   Serratia marcescens NOT DETECTED NOT DETECTED   Haemophilus influenzae NOT DETECTED NOT DETECTED   Neisseria meningitidis NOT DETECTED NOT DETECTED   Pseudomonas aeruginosa NOT DETECTED NOT DETECTED   Candida albicans NOT DETECTED NOT DETECTED   Candida glabrata NOT DETECTED NOT DETECTED   Candida krusei NOT DETECTED NOT DETECTED   Candida parapsilosis NOT DETECTED NOT DETECTED   Candida tropicalis NOT DETECTED NOT  DETECTED   Levester Fresh, PharmD, BCPS, BCCCP Clinical Pharmacist [phone]  Please check AMION for all [facility] numbers  03/13/2018 10:23 AM

## 2018-03-13 NOTE — Progress Notes (Signed)
PROGRESS NOTE    Keith Murillo  QQV:[id number] DOB: 26-Sep-1950 DOA: 03/12/2018 PCP: [name], FNP  Outpatient Specialists:   Brief Narrative:  Keith Murillo is a 68 y.o. male with medical history significant of Prostate Cancer recent diagnosis, Erectile Dysfunction, CAD status post multiple stent placement, HLD, OSA on CPAP.  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  Apparently, patient was helping out people who had known flu.  Patient's chest pain seems to have improved, but the troponin has been on the rise.  Echocardiogram is pending.  T-max is 103.1 F.  Influenza A and B came back negative.  Respiratory viral panel by PCR came back negative.  Blood culture (BC ID) is positive for enterococcus species and Staphylococcus.  Patient is currently on vancomycin and the Unasyn.    Assessment & Plan:   Active Problems:   Coronary artery disease involving native heart without angina pectoris   Hyperlipidemia   Sepsis ([place])   Chest pain   Chest pain -currently resolved in the setting of acute illness.  And sepsis.  We will continue to cycle cardiac enzymes patient will likely benefit from further cardiac evaluation please consult cardiology in the morning.  Given history of CAD and symptoms similar to cardiac etiology  Monitor on telemetry and obtain echo 03/13/2018: Chest pain has improved.  However, troponin has trended upwards from 0.06 to 0 0.12 to 0.29.  Will await cardiology.  States start patient on heparin.  Monitor closely as patient has history of hematuria.   . Coronary artery disease involving native heart -will need further work-up given exertional chest pain.  Continue to observe on telemetry and cycle cardiac enzymes check lipid panel in the morning 03/13/2018: Currently see above.  . Hyperlipidemia: Follow repeat lipid panel.  . Sepsis (HCC)-Sirs etiology unclear could be urinary.  Possibly patient passed a stone given straining  and hematuria that she currently has improved.  Also evaluate for viral etiology.  Chest x-ray unremarkable.  For now order blood cultures and urine cultures broad spectrum antibiotics. 03/13/2018: BCID is positive for enterococcus and Staphylococcus.  Infectious disease input is appreciated.  Patient is currently on IV vancomycin and Unasyn.  Will repeat blood culture.  Likely TEE for persistent bacteremia, however, will defer to the infectious disease team.   Recent history of prostate cancer: Bladder thickening is noted. Follow with urology on discharge. Patient may need cystoscopy. Monitor closely as patient is at risk for hematuria.  DVT prophylaxis: Heparin drip.  Monitor closely for bleeding.   Code Status: Full code Family Communication: Patient's daughter Disposition Plan: This will depend on hospital course.  Consultants:   Infectious disease  Cardiology.  Procedures:   Echocardiogram  Antimicrobials:   IV vancomycin  IV the Unasyn    Subjective: Chest pain has improved Fever documented, but none at the moment. Weakness and muscle aches though, improving.  Objective: Vitals:   03/13/18 0800 03/13/18 0943 03/13/18 1232 03/13/18 1400  BP: (!) 99/59     Pulse: 79 94 90 87  Resp: 18 (!) 29 13 (!) 28  Temp: 99.3 F (37.4 C) 98.4 F (36.9 C) (!) 102.4 F (39.1 C) 100.1 F (37.8 C)  TempSrc: Oral Oral Oral Oral  SpO2: 95% 91% 93% 98%  Weight:      Height:        Intake/Output Summary (Last 24 hours) at 03/13/2018 1423 Last data filed at 03/13/2018 1403 Gross per 24 hour  Intake 1144.16 ml  Output 750 ml  Net 394.16 ml   Filed Weights   03/12/18 1607 03/12/18 2312  Weight: 105.2 kg 107.4 kg    Examination:  General exam: Appears calm and comfortable  Respiratory system: Clear to auscultation.  Cardiovascular system: S1 & S2 heard Gastrointestinal system: Abdomen is nondistended, soft and nontender. No organomegaly or masses felt. Normal bowel sounds  heard. Central nervous system: Alert and oriented. No focal neurological deficits. Extremities: There is no leg edema.  Data Reviewed: I have personally reviewed following labs and imaging studies  CBC: Recent Labs  Lab 03/12/18 1830 03/13/18 0908  WBC 11.0* 13.9*  NEUTROABS 10.4*  --   HGB 14.9 13.7  HCT 44.6 41.8  MCV 88.0 88.6  PLT 258 409   Basic Metabolic Panel: Recent Labs  Lab 03/12/18 1655 03/13/18 0908  NA 139 138  K 3.6 4.0  CL 107 106  CO2 21* 23  GLUCOSE 133* 112*  BUN 15 15  CREATININE 1.23 1.14  CALCIUM 9.3 8.6*  MG  --  1.7  PHOS  --  3.6   GFR: Estimated Creatinine Clearance: 74.5 mL/min (by C-G formula based on SCr of 1.14 mg/dL). Liver Function Tests: Recent Labs  Lab 03/12/18 1655 03/13/18 0908  AST 42* 39  ALT 46* 43  ALKPHOS 60 49  BILITOT 1.2 1.0  PROT 5.9* 6.0*  ALBUMIN 3.5 3.3*   Recent Labs  Lab 03/12/18 1655  LIPASE 35   No results for input(s): AMMONIA in the last 168 hours. Coagulation Profile: Recent Labs  Lab 03/12/18 1750  INR 1.1   Cardiac Enzymes: Recent Labs  Lab 03/12/18 2021 03/13/18 0354 03/13/18 0908  CKTOTAL 159  --   --   TROPONINI <0.03 0.06* 0.12*   BNP (last 3 results) No results for input(s): PROBNP in the last 8760 hours. HbA1C: No results for input(s): HGBA1C in the last 72 hours. CBG: No results for input(s): GLUCAP in the last 168 hours. Lipid Profile: Recent Labs    03/13/18 0354  CHOL 152  HDL 39*  LDLCALC 100*  TRIG 66  CHOLHDL 3.9   Thyroid Function Tests: Recent Labs    03/13/18 0354  TSH 0.833   Anemia Panel: No results for input(s): VITAMINB12, FOLATE, FERRITIN, TIBC, IRON, RETICCTPCT in the last 72 hours. Urine analysis:    Component Value Date/Time   COLORURINE YELLOW 03/12/2018 1656   APPEARANCEUR HAZY (A) 03/12/2018 1656   LABSPEC 1.010 03/12/2018 1656   PHURINE 6.0 03/12/2018 1656   GLUCOSEU NEGATIVE 03/12/2018 1656   HGBUR LARGE (A) 03/12/2018 1656    BILIRUBINUR NEGATIVE 03/12/2018 1656   KETONESUR NEGATIVE 03/12/2018 1656   PROTEINUR 30 (A) 03/12/2018 1656   NITRITE NEGATIVE 03/12/2018 1656   LEUKOCYTESUR TRACE (A) 03/12/2018 1656   Sepsis Labs: @LABRCNTIP (procalcitonin:4,lacticidven:4)  ) Recent Results (from the past 240 hour(s))  Culture, blood (Routine x 2)     Status: None (Preliminary result)   Collection Time: 03/12/18  4:34 PM  Result Value Ref Range Status   Specimen Description BLOOD LEFT HAND  Final   Special Requests   Final    BOTTLES DRAWN AEROBIC AND ANAEROBIC Blood Culture adequate volume   Culture  Setup Time   Final    IN BOTH AEROBIC AND ANAEROBIC BOTTLES GRAM POSITIVE COCCI CRITICAL RESULT CALLED TO, READ BACK BY AND VERIFIED WITH: Ellin Mayhew [place] [id number] AT 1012 BY CM Performed at [facility], [place] [address] [place], [place]  [address]    Culture GRAM POSITIVE COCCI  Final   Report Status PENDING  Incomplete  Culture, blood (Routine x 2)     Status: None (Preliminary result)   Collection Time: 03/12/18  4:34 PM  Result Value Ref Range Status   Specimen Description BLOOD RIGHT HAND  Final   Special Requests   Final    BOTTLES DRAWN AEROBIC AND ANAEROBIC Blood Culture adequate volume   Culture  Setup Time   Final    GRAM POSITIVE COCCI AEROBIC BOTTLE ONLY CRITICAL VALUE NOTED.  VALUE IS CONSISTENT WITH PREVIOUSLY REPORTED AND CALLED VALUE.    Culture   Final    NO GROWTH < 24 HOURS Performed at [facility], [place] [address], [place], [place] [address]    Report Status PENDING  Incomplete  Blood Culture ID Panel (Reflexed)     Status: Abnormal   Collection Time: 03/12/18  4:34 PM  Result Value Ref Range Status   Enterococcus species DETECTED (A) NOT DETECTED Final    Comment: CRITICAL RESULT CALLED TO, READ BACK BY AND VERIFIED WITH: PHARMD M MACCIA [id number] AT 1012 BY CM    Vancomycin resistance NOT DETECTED NOT DETECTED Final   Listeria monocytogenes NOT DETECTED NOT DETECTED Final    Staphylococcus species DETECTED (A) NOT DETECTED Final    Comment: Methicillin (oxacillin) susceptible coagulase negative staphylococcus. Possible blood culture contaminant (unless isolated from more than one blood culture draw or clinical case suggests pathogenicity). No antibiotic treatment is indicated for blood  culture contaminants. CRITICAL RESULT CALLED TO, READ BACK BY AND VERIFIED WITH: PHARMD M [place] [id number] AT 1012 BY CM    Staphylococcus aureus (BCID) NOT DETECTED NOT DETECTED Final   Methicillin resistance NOT DETECTED NOT DETECTED Final   Streptococcus species NOT DETECTED NOT DETECTED Final   Streptococcus agalactiae NOT DETECTED NOT DETECTED Final   Streptococcus pneumoniae NOT DETECTED NOT DETECTED Final   Streptococcus pyogenes NOT DETECTED NOT DETECTED Final   Acinetobacter baumannii NOT DETECTED NOT DETECTED Final   Enterobacteriaceae species NOT DETECTED NOT DETECTED Final   Enterobacter cloacae complex NOT DETECTED NOT DETECTED Final   Escherichia coli NOT DETECTED NOT DETECTED Final   Klebsiella oxytoca NOT DETECTED NOT DETECTED Final   Klebsiella pneumoniae NOT DETECTED NOT DETECTED Final   Proteus species NOT DETECTED NOT DETECTED Final   Serratia marcescens NOT DETECTED NOT DETECTED Final   Haemophilus influenzae NOT DETECTED NOT DETECTED Final   Neisseria meningitidis NOT DETECTED NOT DETECTED Final   Pseudomonas aeruginosa NOT DETECTED NOT DETECTED Final   Candida albicans NOT DETECTED NOT DETECTED Final   Candida glabrata NOT DETECTED NOT DETECTED Final   Candida krusei NOT DETECTED NOT DETECTED Final   Candida parapsilosis NOT DETECTED NOT DETECTED Final   Candida tropicalis NOT DETECTED NOT DETECTED Final    Comment: Performed at [facility], [address] [place], [place] [address]  Respiratory Panel by PCR     Status: None   Collection Time: 03/12/18  5:33 PM  Result Value Ref Range Status   Adenovirus NOT DETECTED NOT DETECTED Final    Coronavirus 229E NOT DETECTED NOT DETECTED Final    Comment: (NOTE) The Coronavirus on the Respiratory Panel, DOES NOT test for the novel  Coronavirus (2019 nCoV)    Coronavirus HKU1 NOT DETECTED NOT DETECTED Final   Coronavirus NL63 NOT DETECTED NOT DETECTED Final   Coronavirus OC43 NOT DETECTED NOT DETECTED Final   Metapneumovirus NOT DETECTED NOT DETECTED Final  Rhinovirus / Enterovirus NOT DETECTED NOT DETECTED Final   Influenza A NOT DETECTED NOT DETECTED Final   Influenza B NOT DETECTED NOT DETECTED Final   Parainfluenza Virus 1 NOT DETECTED NOT DETECTED Final   Parainfluenza Virus 2 NOT DETECTED NOT DETECTED Final   Parainfluenza Virus 3 NOT DETECTED NOT DETECTED Final   Parainfluenza Virus 4 NOT DETECTED NOT DETECTED Final   Respiratory Syncytial Virus NOT DETECTED NOT DETECTED Final   Bordetella pertussis NOT DETECTED NOT DETECTED Final   Chlamydophila pneumoniae NOT DETECTED NOT DETECTED Final   Mycoplasma pneumoniae NOT DETECTED NOT DETECTED Final    Comment: Performed at [facility], [place] [address], [place], [place] [address]         Radiology Studies: Ct Abdomen Pelvis W Contrast  Result Date: 03/12/2018 CLINICAL DATA:  Left-sided chest pain.  Fever and vomiting. EXAM: CT ABDOMEN AND PELVIS WITH CONTRAST TECHNIQUE: Multidetector CT imaging of the abdomen and pelvis was performed using the standard protocol following bolus administration of intravenous contrast. CONTRAST:  14mL OMNIPAQUE IOHEXOL 300 MG/ML  SOLN COMPARISON:  None. FINDINGS: Lower chest: Top normal heart size without pericardial effusion. At least three-vessel coronary arteriosclerosis is noted. Dependent bibasilar atelectasis is seen. Hepatobiliary: Steatosis of the liver. Gallbladder is unremarkable and free of stones. No biliary dilatation. No hepatic mass is identified. Pancreas: Normal Spleen: Normal Adrenals/Urinary Tract: Normal bilateral adrenal glands, kidneys and ureters without evidence of  obstructive uropathy. Asymmetric eccentric thickening of the anterior right lateral wall of the urinary bladder in part due to infolding of the bladder. Focal cystitis or a subtle mucosal bladder lesion is not entirely excluded. Consider direct visual correlation. Focal inflammatory change or mucosal lesion is not entirely excluded. Stomach/Bowel: Small hiatal hernia. Decompressed stomach. Normal small bowel rotation. Normal appearing appendix. Average stool retention within the colon without bowel obstruction or inflammation. Vascular/Lymphatic: Nonaneurysmal atherosclerotic aorta. No lymphadenopathy. Reproductive: On the large prostate with central zone calcifications. Other: Fat containing inguinal hernia.  No free air nor free fluid. Musculoskeletal: Degenerative disc disease of the lower lumbar spine. Multilevel mild facet arthropathy and hypertrophy. IMPRESSION: 1. Hepatic steatosis. 2. Focal right-sided anterior bladder thickening in part due to infolding of the bladder. A mucosal lesion or focal cystitis are among differential possibilities. Consider direct visual correlation as deemed clinically warranted or repeat imaging with better bladder distension and opacification of the urine. 3. Coronary arteriosclerosis. 4. Fat containing right inguinal hernia. Electronically Signed   By: [name] M.D.   On: 03/12/2018 19:23   Dg Chest Portable 1 View  Result Date: 03/12/2018 CLINICAL DATA:  LEFT chest pain, shortness of breath and vomiting today, blood in urine, body aches EXAM: PORTABLE CHEST 1 VIEW COMPARISON:  Portable exam 1616 hours without priors for comparison FINDINGS: Upper normal heart size. Mediastinal contours and pulmonary vascularity normal. Lungs clear. No infiltrate, pleural effusion or pneumothorax. Bones unremarkable. IMPRESSION: No acute abnormalities. Electronically Signed   By: Lavonia [place] M.D.   On: 03/12/2018 16:48        Scheduled Meds: . aspirin  81 mg Oral Daily    Continuous Infusions: . ampicillin (OMNIPEN) IV 2 g (03/13/18 1230)  . vancomycin 750 mg (03/13/18 1403)     LOS: 0 days    Time spent: 35-minute   [place] Allan, MD  [facility] Pager #: [phone] 7PM-7AM contact night coverage as above

## 2018-03-13 NOTE — Progress Notes (Signed)
  Echocardiogram 2D Echocardiogram has been performed.  Keith Murillo 03/13/2018, 10:17 AM

## 2018-03-13 NOTE — Consult Note (Signed)
[facility] for Infectious Disease       Reason for Consult: Enterococcal bacteremia    Referring Physician: CHAMP autoconsult  Active Problems:   Coronary artery disease involving native heart without angina pectoris   Hyperlipidemia   Sepsis ([place])   Chest pain   . aspirin  81 mg Oral Daily    Recommendations: Can use amp/sulbactam for Enterococcus and Staph TEE Repeat blood cultures   Assessment: He has had blood in the urine including clots and possible bladder thickening and now Enterococcus in blood cultures and MSSE, both on BCID.  May have had bladder stone, unknown but did have significant pain.    Antibiotics: Amp vancomycin  HPI: Keith Murillo is a 68 y.o. male with history of prostate cancer and biopsy last Fall, CAD and stent placement who came in with chest pain similar to previous MI and also fever, rigors.  Had been working outside, digging, heavy lifting and also fever, chills, myalgias developed.  Vomited.  Noted hematuria and clots.  No history of kidney stones.  Blood cultures as above.  Troponin with some elevation, lactate initially 3.1.    Review of Systems:  Constitutional: positive for chills or negative for anorexia Gastrointestinal: negative for diarrhea Integument/breast: negative for rash All other systems reviewed and are negative    Past Medical History:  Diagnosis Date  . Chest pain   . Heart attack ([place])   . Hypercholesterolemia   . Hyperlipidemia   . Ischemic heart disease September 2004   Known--with non-Q-wave myocardial infarction   . Obesity   . Sleep apnea     Social History   Tobacco Use  . Smoking status: Never Smoker  . Smokeless tobacco: Never Used  Substance Use Topics  . Alcohol use: Yes    Comment: occas  . Drug use: No    Family History  Problem Relation Age of Onset  . Cancer Mother   . Hearing loss Father   . Cancer Maternal Grandmother   . Parkinson's disease Maternal Grandfather   .  Stroke Maternal Grandfather   . Stroke Paternal Grandmother   . Heart attack Paternal Grandfather     Allergies  Allergen Reactions  . Lisinopril Cough    Physical Exam: Constitutional: in no apparent distress and alert  Vitals:   03/13/18 1232 03/13/18 1400  BP:    Pulse: 90 87  Resp: 13 (!) 28  Temp: (!) 102.4 F (39.1 C) 100.1 F (37.8 C)  SpO2: 93% 98%   EYES: anicteric ENMT: no thrush Cardiovascular: Cor RRR Respiratory: CTA B; normal respiratory effort GI: Bowel sounds are normal, liver is not enlarged, spleen is not enlarged Musculoskeletal: no pedal edema noted Skin: negatives: no rash Hematologic: no cervical lad  Lab Results  Component Value Date   WBC 13.9 (H) 03/13/2018   HGB 13.7 03/13/2018   HCT 41.8 03/13/2018   MCV 88.6 03/13/2018   PLT 185 03/13/2018    Lab Results  Component Value Date   CREATININE 1.14 03/13/2018   BUN 15 03/13/2018   NA 138 03/13/2018   K 4.0 03/13/2018   CL 106 03/13/2018   CO2 23 03/13/2018    Lab Results  Component Value Date   ALT 43 03/13/2018   AST 39 03/13/2018   ALKPHOS 49 03/13/2018     Microbiology: Recent Results (from the past 240 hour(s))  Culture, blood (Routine x 2)     Status: None (Preliminary result)   Collection Time: 03/12/18  4:34 PM  Result Value Ref Range Status   Specimen Description BLOOD LEFT HAND  Final   Special Requests   Final    BOTTLES DRAWN AEROBIC AND ANAEROBIC Blood Culture adequate volume   Culture  Setup Time   Final    IN BOTH AEROBIC AND ANAEROBIC BOTTLES GRAM POSITIVE COCCI CRITICAL RESULT CALLED TO, READ BACK BY AND VERIFIED WITH: Ailene Rud [id number] AT 1012 BY CM Performed at [facility], [place] [address] [place], [place] [address]    Culture GRAM POSITIVE COCCI  Final   Report Status PENDING  Incomplete  Culture, blood (Routine x 2)     Status: None (Preliminary result)   Collection Time: 03/12/18  4:34 PM  Result Value Ref Range Status   Specimen  Description BLOOD RIGHT HAND  Final   Special Requests   Final    BOTTLES DRAWN AEROBIC AND ANAEROBIC Blood Culture adequate volume   Culture  Setup Time   Final    GRAM POSITIVE COCCI IN BOTH AEROBIC AND ANAEROBIC BOTTLES CRITICAL VALUE NOTED.  VALUE IS CONSISTENT WITH PREVIOUSLY REPORTED AND CALLED VALUE.    Culture   Final    NO GROWTH < 24 HOURS Performed at [facility], [place] [address] [place], [place] [address]    Report Status PENDING  Incomplete  Blood Culture ID Panel (Reflexed)     Status: Abnormal   Collection Time: 03/12/18  4:34 PM  Result Value Ref Range Status   Enterococcus species DETECTED (A) NOT DETECTED Final    Comment: CRITICAL RESULT CALLED TO, READ BACK BY AND VERIFIED WITH: PHARMD M MACCIA [id number] AT 1012 BY CM    Vancomycin resistance NOT DETECTED NOT DETECTED Final   Listeria monocytogenes NOT DETECTED NOT DETECTED Final   Staphylococcus species DETECTED (A) NOT DETECTED Final    Comment: Methicillin (oxacillin) susceptible coagulase negative staphylococcus. Possible blood culture contaminant (unless isolated from more than one blood culture draw or clinical case suggests pathogenicity). No antibiotic treatment is indicated for blood  culture contaminants. CRITICAL RESULT CALLED TO, READ BACK BY AND VERIFIED WITH: PHARMD M [place] [id number] AT 1012 BY CM    Staphylococcus aureus (BCID) NOT DETECTED NOT DETECTED Final   Methicillin resistance NOT DETECTED NOT DETECTED Final   Streptococcus species NOT DETECTED NOT DETECTED Final   Streptococcus agalactiae NOT DETECTED NOT DETECTED Final   Streptococcus pneumoniae NOT DETECTED NOT DETECTED Final   Streptococcus pyogenes NOT DETECTED NOT DETECTED Final   Acinetobacter baumannii NOT DETECTED NOT DETECTED Final   Enterobacteriaceae species NOT DETECTED NOT DETECTED Final   Enterobacter cloacae complex NOT DETECTED NOT DETECTED Final   Escherichia coli NOT DETECTED NOT DETECTED Final   Klebsiella oxytoca  NOT DETECTED NOT DETECTED Final   Klebsiella pneumoniae NOT DETECTED NOT DETECTED Final   Proteus species NOT DETECTED NOT DETECTED Final   Serratia marcescens NOT DETECTED NOT DETECTED Final   Haemophilus influenzae NOT DETECTED NOT DETECTED Final   Neisseria meningitidis NOT DETECTED NOT DETECTED Final   Pseudomonas aeruginosa NOT DETECTED NOT DETECTED Final   Candida albicans NOT DETECTED NOT DETECTED Final   Candida glabrata NOT DETECTED NOT DETECTED Final   Candida krusei NOT DETECTED NOT DETECTED Final   Candida parapsilosis NOT DETECTED NOT DETECTED Final   Candida tropicalis NOT DETECTED NOT DETECTED Final    Comment: Performed at [facility], [address], [place], [place] [address]  Respiratory Panel by PCR  Status: None   Collection Time: 03/12/18  5:33 PM  Result Value Ref Range Status   Adenovirus NOT DETECTED NOT DETECTED Final   Coronavirus 229E NOT DETECTED NOT DETECTED Final    Comment: (NOTE) The Coronavirus on the Respiratory Panel, DOES NOT test for the novel  Coronavirus (2019 nCoV)    Coronavirus HKU1 NOT DETECTED NOT DETECTED Final   Coronavirus NL63 NOT DETECTED NOT DETECTED Final   Coronavirus OC43 NOT DETECTED NOT DETECTED Final   Metapneumovirus NOT DETECTED NOT DETECTED Final   Rhinovirus / Enterovirus NOT DETECTED NOT DETECTED Final   Influenza A NOT DETECTED NOT DETECTED Final   Influenza B NOT DETECTED NOT DETECTED Final   Parainfluenza Virus 1 NOT DETECTED NOT DETECTED Final   Parainfluenza Virus 2 NOT DETECTED NOT DETECTED Final   Parainfluenza Virus 3 NOT DETECTED NOT DETECTED Final   Parainfluenza Virus 4 NOT DETECTED NOT DETECTED Final   Respiratory Syncytial Virus NOT DETECTED NOT DETECTED Final   Bordetella pertussis NOT DETECTED NOT DETECTED Final   Chlamydophila pneumoniae NOT DETECTED NOT DETECTED Final   Mycoplasma pneumoniae NOT DETECTED NOT DETECTED Final    Comment: Performed at [facility], [place] [address]  [place], [place] [address]    Robert W Comer, [place] for Infectious Disease [facility] [email or web address] 03/13/2018, 3:43 PM

## 2018-03-13 NOTE — Progress Notes (Signed)
Page to Dr. Edd Fabian with new results of troponin.

## 2018-03-13 NOTE — Progress Notes (Signed)
Pharmacy Antibiotic Note  MASTER TOUCHET is a 68 y.o. male admitted on 03/12/2018 with bacteremia.  Pharmacy has been consulted for bacteremia dosing.  Blood cultures showing 3/4 GPC identified on BCID as enterococcus and coag-negative staph. WBC 11>13.9, PCT 15.90, LA 2.3>1.5. Tmax 102.6 in 24 hours. Scr 1.14 (CrCl 74 mL/min).   Plan: Unasyn 3 g IV every 6 hours  Monitor renal fx, cx results, clinical pic, and ID w/u  Height: 5\' 7"  (170.2 cm) Weight: 236 lb 12.4 oz (107.4 kg) IBW/kg (Calculated) : 66.1  Temp (24hrs), Avg:100.6 F (38.1 C), Min:98.3 F (36.8 C), Max:103.1 F (39.5 C)  Recent Labs  Lab 03/12/18 1655 03/12/18 1830 03/12/18 1929 03/12/18 2119 03/13/18 0354 03/13/18 0634 03/13/18 0908  WBC  --  11.0*  --   --   --   --  13.9*  CREATININE 1.23  --   --   --   --   --  1.14  LATICACIDVEN  --   --  2.5* 2.3* 1.7 1.3 1.5    Estimated Creatinine Clearance: 74.5 mL/min (by C-G formula based on SCr of 1.14 mg/dL).    Allergies  Allergen Reactions  . Lisinopril Cough    Antimicrobials this admission: Unasyn 3/7>> Ampicillin 3/7 x1 Vancomycin 3/6 >>  Cefepime 3/6 >> 3/7 Metronidazole 3/6 >> 3/7 Ceftriaxone 3/6 x 1  Dose adjustments this admission: N/A  Microbiology results: 3/6 BCx: 3/4 GPC (BCID coag neg + Enterococcus) 3/6 UCx:  sent 3/6 Resp panel: neg  Thank you for allowing pharmacy to be a part of this patient's care.  [name], PharmD, [place] Clinical Pharmacist  Pager: [phone] Phone: [phone] 03/13/2018 3:58 PM

## 2018-03-14 DIAGNOSIS — E785 Hyperlipidemia, unspecified: Secondary | ICD-10-CM

## 2018-03-14 DIAGNOSIS — A408 Other streptococcal sepsis: Secondary | ICD-10-CM

## 2018-03-14 DIAGNOSIS — R6520 Severe sepsis without septic shock: Secondary | ICD-10-CM

## 2018-03-14 DIAGNOSIS — I248 Other forms of acute ischemic heart disease: Secondary | ICD-10-CM

## 2018-03-14 LAB — CBC
HCT: 39.6 % (ref 39.0–52.0)
Hemoglobin: 13.8 g/dL (ref 13.0–17.0)
MCH: 30.1 pg (ref 26.0–34.0)
MCHC: 34.8 g/dL (ref 30.0–36.0)
MCV: 86.3 fL (ref 80.0–100.0)
Platelets: 172 10*3/uL (ref 150–400)
RBC: 4.59 MIL/uL (ref 4.22–5.81)
RDW: 13.2 % (ref 11.5–15.5)
WBC: 7.8 10*3/uL (ref 4.0–10.5)
nRBC: 0 % (ref 0.0–0.2)

## 2018-03-14 LAB — HEPARIN LEVEL (UNFRACTIONATED)
Heparin Unfractionated: 0.12 IU/mL — ABNORMAL LOW (ref 0.30–0.70)
Heparin Unfractionated: <0.1 IU/mL — ABNORMAL LOW (ref 0.30–0.70)
Heparin Unfractionated: 0.13 IU/mL — ABNORMAL LOW (ref 0.30–0.70)

## 2018-03-14 LAB — TROPONIN I: Troponin I: 0.23 ng/mL (ref <0.03)

## 2018-03-14 LAB — HIV ANTIBODY (ROUTINE TESTING W REFLEX): HIV SCREEN 4TH GENERATION: NONREACTIVE

## 2018-03-14 MED ORDER — HEPARIN BOLUS VIA INFUSION
1500.0000 [IU] | Freq: Once | INTRAVENOUS | Status: AC
  Administered 2018-03-14: 1500 [IU] via INTRAVENOUS
  Filled 2018-03-14: qty 1500

## 2018-03-14 NOTE — Progress Notes (Signed)
Mr Guastella was sleeping and woke up needing to urinate.  Unable to urinate sitting on the side of the bed.  Stood up to urinate.  Became sob with mild mid sternal chest pain and nausea.  VS taken, temp 100.0 oral.  B/p elevated to 175/ 95, RR 28-30, HR 117, O2Sat 97%.  Page placed to Dr. Marthenia Rolling.

## 2018-03-14 NOTE — Consult Note (Addendum)
Cardiology Consultation:   Patient ID: JEVIN CAMINO MRN: [id number]; DOB: Apr 07, 1950  Admit date: 03/12/2018 Date of Consult: 03/14/2018  Primary Care Provider: [name], FNP Primary Cardiologist: New.  Previously followed by Dr. Romeo Apple Primary Electrophysiologist:  None   Patient Profile:   KYDEN POTASH is a 68 y.o. male with a hx of CAD status post remote PCI to the LAD and RCA, hyperlipidemia, obstructive sleep apnea on CPAP and prostate cancer who presented to the ED on 3/7 with chief complaint of left-sided chest pain.  In the ED he met sepsis criteria and found to have positive blood cultures growing enterococcus species and Staphylococcus.  Influenza and respiratory panels negative.  Also found to have elevated troponin.  He is being seen today for evaluation of chest pain and elevated troponin at the request of Dr. Marthenia Rolling, Internal Medicine.    History of Present Illness:   As noted above, Mr. Bunyard has a known history of coronary artery disease.  He was previously being followed by Dr. Cherylann Parr.  There are limited records available in electronic chart.  However it appears his last cardiac catheterization was in 2009 and he underwent stenting to the LAD at that time.  He also has a prior history of RCA stents.  Patient reports he was first diagnosed with CAD in 2004 when he presented with a non-STEMI.  Unfortunately he has not followed up with a cardiologist since [name] left.  Other medical problems include hyperlipidemia, obstructive sleep apnea on CPAP as well as recent diagnosis of prostate cancer.  He presented to the ED on 3/7 with chief complaint of left-sided chest pain, malaise, chills, nausea and vomiting.  He was in his usual state of health until Friday morning, 03/12/2018.  He spent most of the morning helping his brother-in-law doing yard work.  He was helping him dig a trench in a driveway.  This took significant effort.  Patient did  not feel any chest discomfort at the time but just felt tired and weak.  He worked in the yard for several hours.  He returned home in the afternoon and developed chills.  He took a hot shower  but did not feel any better.  He later had something to eat and immediately became nauseated and started to vomit.  He then developed left-sided chest discomfort which felt very similar to his chest discomfort in 2004 when he had his non-STEMI.  It did not radiate.  He took 2 aspirin which helped to relieve the discomfort.  Chest discomfort left lasted for approximately 1 hour.  He continued to feel unwell with nausea and chills and decided to come into the ED for evaluation.   In the ED he met sepsis criteria.  He was tachycardic and tachypneic.  He was febrile with a temperature of 102.7.  White count elevated at 13.9.  Influenza and respiratory panels were negative.  He was found to have positive blood cultures growing enterococcus species and Staphylococcus.  He denies any recent dental work.  No colonoscopies or joint injections.  He did have a biopsy of his prostate but this was several months ago in the fall.    As part of his work-up patient was also found to have elevated troponin.  Troponin trend, 0.06>> 0.12>>> 0.29.  Initial EKG showed sinus tachycardia however repeat EKG later showed normal sinus rhythm.  No acute ST abnormalities.  Echocardiogram was done yesterday and showed normal left ventricular ejection fraction  of 60 to 65% and no wall motion abnormalities.  No significant valvular abnormalities.  He denies any recurrent chest pain.  His last episode of chest discomfort was Friday afternoon.   Infectious disease was consulted for bacteremia patient was placed on antibiotics.  He is currently on ampicillin-Sulbactam.  IV heparin has also been initiated for elevated troponin.  At last vitals checked this morning, he was afebrile.    Past Medical History:  Diagnosis Date  . Chest pain   . Heart  attack ([place])   . Hypercholesterolemia   . Hyperlipidemia   . Ischemic heart disease September 2004   Known--with non-Q-wave myocardial infarction   . Obesity   . Sleep apnea     Past Surgical History:  Procedure Laterality Date  . CARDIAC CATHETERIZATION  11/2007     Home Medications:  Prior to Admission medications   Medication Sig Start Date End Date Taking? Authorizing Provider  aspirin 325 MG tablet Take 325 mg by mouth daily as needed (pain).    Yes [provider]  Cholecalciferol (VITAMIN D-3) 125 MCG (5000 UT) TABS Take 10,000 Units by mouth daily.   Yes [provider]  CRANBERRY PO Take 1 capsule by mouth daily.   Yes [provider]  Cyanocobalamin (VITAMIN B-12 PO) Take 1 tablet by mouth daily.   Yes [provider]  DHEA 25 MG CAPS Take 25 mg by mouth daily.    Yes [provider]  Omega-3 Fatty Acids (OMEGA 3 PO) Take 1 capsule by mouth daily.   Yes [provider]  OVER THE COUNTER MEDICATION Take 1,300 mg by mouth See admin instructions. [place] grape capsules - 650 mg each - take 2 capsules by mouth daily   Yes [provider]  OVER THE COUNTER MEDICATION Take 1 capsule by mouth daily. EDTA   Yes [provider]  sildenafil (REVATIO) 20 MG tablet 2-5 tabs 1 hour prior to intercourse Patient taking differently: Take 60-80 mg by mouth daily as needed (prior to intercourse).  08/26/17  Yes Stoioff, Ronda Fairly, MD  TURMERIC PO Take 1 capsule by mouth daily.   Yes [provider]  vitamin C (ASCORBIC ACID) 500 MG tablet Take 500 mg by mouth daily.   Yes [provider]  lisinopril (PRINIVIL,ZESTRIL) 10 MG tablet TAKE ONE TABLET BY MOUTH ONE TIME DAILY  Patient not taking: Reported on 03/12/2018 11/09/17   [name], FNP    Inpatient Medications: Scheduled Meds: . aspirin  81 mg Oral Daily   Continuous Infusions: . ampicillin-sulbactam (UNASYN) IV 3 g (03/14/18 0453)  .  heparin 1,300 Units/hr (03/14/18 0143)   PRN Meds: acetaminophen **OR** acetaminophen, HYDROcodone-acetaminophen, ondansetron **OR** ondansetron (ZOFRAN) IV  Allergies:    Allergies  Allergen Reactions  . Lisinopril Cough    Social History:   Social History   Socioeconomic History  . Marital status: Married    Spouse name: Not on file  . Number of children: Not on file  . Years of education: Not on file  . Highest education level: Not on file  Occupational History  . Not on file  Social Needs  . Financial resource strain: Not on file  . Food insecurity:    Worry: Not on file    Inability: Not on file  . Transportation needs:    Medical: Not on file    Non-medical: Not on file  Tobacco Use  . Smoking status: Never Smoker  . Smokeless tobacco: Never Used  Substance and Sexual Activity  . Alcohol use: Yes    Comment: occas  . Drug use: No  . Sexual activity: Yes  Lifestyle  . Physical activity:    Days per week: Not on file    Minutes per session: Not on file  . Stress: Not on file  Relationships  . Social connections:    Talks on phone: Not on file    Gets together: Not on file    Attends religious service: Not on file    Active member of club or organization: Not on file    Attends meetings of clubs or organizations: Not on file    Relationship status: Not on file  . Intimate partner violence:    Fear of current or ex partner: Not on file    Emotionally abused: Not on file    Physically abused: Not on file    Forced sexual activity: Not on file  Other Topics Concern  . Not on file  Social History Narrative  . Not on file    Family History:    Family History  Problem Relation Age of Onset  . Cancer Mother   . Hearing loss Father   . Cancer Maternal Grandmother   . Parkinson's disease Maternal Grandfather   . Stroke Maternal Grandfather   . Stroke Paternal Grandmother   . Heart attack Paternal Grandfather      ROS:  Please see the history of  present illness.   All other ROS reviewed and negative.     Physical Exam/Data:   Vitals:   03/13/18 2200 03/13/18 2331 03/14/18 0313 03/14/18 0800  BP:  130/72 (!) 142/88 (!) 142/75  Pulse:  68 84 80  Resp:  (!) 21 19 (!) 30  Temp: 100 F (37.8 C) 99.6 F (37.6 C) 98.8 F (37.1 C) 99.3 F (37.4 C)  TempSrc:  Oral Oral Oral  SpO2:  95% 94% 97%  Weight:      Height:        Intake/Output Summary (Last 24 hours) at 03/14/2018 0819 Last data filed at 03/14/2018 0800 Gross per 24 hour  Intake 850 ml  Output 2125 ml  Net -1275 ml   Last 3 Weights 03/12/2018 03/12/2018 11/19/2017  Weight (lbs) 236 lb 12.4 oz 232 lb 235 lb  Weight (kg) 107.4 kg 105.235 kg 106.595 kg     Body mass index is 37.08 kg/m.  General: Moderately obese middle-aged white male.  Slightly diaphoretic but no acute distress, respirations normal HEENT: normal Lymph: no adenopathy Neck: no JVD Endocrine:  No thryomegaly Vascular: No carotid bruits; FA pulses 2+ bilaterally without bruits  Cardiac:  normal S1, S2; RRR; no murmur Lungs:  clear to auscultation bilaterally, no wheezing, rhonchi or rales  Abd: soft, nontender, no hepatomegaly  Ext: no edema Musculoskeletal:  No deformities, BUE and BLE strength normal and equal Skin: warm and dry  Neuro:  CNs 2-12 intact, no focal abnormalities noted Psych:  Normal affect   EKG:  The EKG 03/14/2018 was personally reviewed and demonstrates: Normal sinus rhythm.  77 bpm.  No acute ST segment abnormalities Telemetry:  Telemetry was personally reviewed and demonstrates: Normal sinus rhythm.  Relevant CV Studies: Cardiac catheterization with LAD PCI 11/29/2007  ANGIOGRAPHIC DATA:  1. Left main coronary artery is normal.  2. Left circumflex.  The left circumflex has a 30-40% segmental      narrowing proximally.  The majority of the left circumflex      continues as a  bifurcating obtuse marginal vessel with diffuse      irregularities but no significant focal stenosis.   The continuation      of the left circumflex is a moderately small vessel.  It has      diffuse 50-60% narrowing distally and one small branch has a 90%      narrowing.  These vessels would be too small to be a candidate for      angioplasty or possible bypass surgery.  3. Left anterior descending.  The left anterior descending has a stent      in its proximal segment.  The stent is widely patent.  In the      midportion of the vessel, there is a discrete area of 90% stenosis      which is approximately 10 mm in length.  The distal portion of the      left anterior descending is considerably smaller than the more      proximal portion at this location, so, it would be suitable for a      stent placement.  The left anterior descending does have diffuse      disease distally as it crosses the apex.  4. Right coronary artery.  The right coronary artery is a small      dominant vessel.  There was a patent stent proximally.  There are      diffuse irregularities in the right coronary artery up to the crux.      Distally, the vessels are quite small and are diffusely diseased.      There was severe stenosis and a small 1-mm acute marginal vessel.      This would be approximately 90% stenotic but would not be suitable      for angioplasty or consideration for bypass grafting.   Left ventricular angiogram.  The left ventricular angiogram was  performed in an RAO position.  Overall cardiac size and silhouette are  normal, the global ejection fraction is estimated at 55% range.  There  is possibly some mild anterior hypokinesis.  There is no mitral  regurgitation noted.   OVERALL IMPRESSION:  1. Normal left ventricular function with mild anterior hypokinesis.  2. Severe three-vessel coronary artery disease involving mainly the      distal stenosis but with a severe stenosis in midportion of the      left anterior descending that would be approachable with      angioplasty and with patent  stents in the proximal right coronary      artery and proximal left anterior descending.   DISCUSSION:  Justine Null will need to be managed medically in general, but we  do have the opportunity for a stent placement in the mid left anterior  descending.  At least a large part of the issue does remain distal  coronary atherosclerosis.   PROCEDURE:  Stent placement in the left anterior descending with a 23 x  2.75 mm Promus (drug-eluting stent).  _________________________ 2D echocardiogram 03/13/2018 IMPRESSIONS    1. The left ventricle has normal systolic function with an ejection fraction of 60-65%. The cavity size was normal. There is moderately increased left ventricular wall thickness. Left ventricular diastolic Doppler parameters are consistent with impaired  relaxation Indeterminent filling pressures The E/e' is 8-15. No evidence of left ventricular regional wall motion abnormalities.  2. The right ventricle has normal systolic function. The cavity was normal. There is no increase in right ventricular wall thickness.  3. Left atrial size  was mildly dilated.  4. The mitral valve is degenerative. Mild thickening of the mitral valve leaflet. Mild calcification of the mitral valve leaflet.  5. The aortic valve was not well visualized.  6. The ascending aorta and aortic root are normal in size and structure.  7. No evidence of left ventricular regional wall motion abnormalities.  8. The interatrial septum was not well visualized.  Laboratory Data:  Chemistry Recent Labs  Lab 03/12/18 1655 03/13/18 0908  NA 139 138  K 3.6 4.0  CL 107 106  CO2 21* 23  GLUCOSE 133* 112*  BUN 15 15  CREATININE 1.23 1.14  CALCIUM 9.3 8.6*  GFRNONAA >60 >60  GFRAA >60 >60  ANIONGAP 11 9    Recent Labs  Lab 03/12/18 1655 03/13/18 0908  PROT 5.9* 6.0*  ALBUMIN 3.5 3.3*  AST 42* 39  ALT 46* 43  ALKPHOS 60 49  BILITOT 1.2 1.0   Hematology Recent Labs  Lab 03/12/18 1830 03/13/18 0908  WBC  11.0* 13.9*  RBC 5.07 4.72  HGB 14.9 13.7  HCT 44.6 41.8  MCV 88.0 88.6  MCH 29.4 29.0  MCHC 33.4 32.8  RDW 13.0 13.1  PLT 258 185   Cardiac Enzymes Recent Labs  Lab 03/12/18 2021 03/13/18 0354 03/13/18 0908 03/13/18 1432  TROPONINI <0.03 0.06* 0.12* 0.29*    Recent Labs  Lab 03/12/18 1657  TROPIPOC 0.02    BNPNo results for input(s): BNP, PROBNP in the last 168 hours.  DDimer No results for input(s): DDIMER in the last 168 hours.  Radiology/Studies:  Ct Abdomen Pelvis W Contrast  Result Date: 03/12/2018 CLINICAL DATA:  Left-sided chest pain.  Fever and vomiting. EXAM: CT ABDOMEN AND PELVIS WITH CONTRAST TECHNIQUE: Multidetector CT imaging of the abdomen and pelvis was performed using the standard protocol following bolus administration of intravenous contrast. CONTRAST:  111m OMNIPAQUE IOHEXOL 300 MG/ML  SOLN COMPARISON:  None. FINDINGS: Lower chest: Top normal heart size without pericardial effusion. At least three-vessel coronary arteriosclerosis is noted. Dependent bibasilar atelectasis is seen. Hepatobiliary: Steatosis of the liver. Gallbladder is unremarkable and free of stones. No biliary dilatation. No hepatic mass is identified. Pancreas: Normal Spleen: Normal Adrenals/Urinary Tract: Normal bilateral adrenal glands, kidneys and ureters without evidence of obstructive uropathy. Asymmetric eccentric thickening of the anterior right lateral wall of the urinary bladder in part due to infolding of the bladder. Focal cystitis or a subtle mucosal bladder lesion is not entirely excluded. Consider direct visual correlation. Focal inflammatory change or mucosal lesion is not entirely excluded. Stomach/Bowel: Small hiatal hernia. Decompressed stomach. Normal small bowel rotation. Normal appearing appendix. Average stool retention within the colon without bowel obstruction or inflammation. Vascular/Lymphatic: Nonaneurysmal atherosclerotic aorta. No lymphadenopathy. Reproductive: On the  large prostate with central zone calcifications. Other: Fat containing inguinal hernia.  No free air nor free fluid. Musculoskeletal: Degenerative disc disease of the lower lumbar spine. Multilevel mild facet arthropathy and hypertrophy. IMPRESSION: 1. Hepatic steatosis. 2. Focal right-sided anterior bladder thickening in part due to infolding of the bladder. A mucosal lesion or focal cystitis are among differential possibilities. Consider direct visual correlation as deemed clinically warranted or repeat imaging with better bladder distension and opacification of the urine. 3. Coronary arteriosclerosis. 4. Fat containing right inguinal hernia. Electronically Signed   By: DAshley RoyaltyM.D.   On: 03/12/2018 19:23   Dg Chest Portable 1 View  Result Date: 03/12/2018 CLINICAL DATA:  LEFT chest pain, shortness of breath and vomiting today, blood in urine,  body aches EXAM: PORTABLE CHEST 1 VIEW COMPARISON:  Portable exam 1616 hours without priors for comparison FINDINGS: Upper normal heart size. Mediastinal contours and pulmonary vascularity normal. Lungs clear. No infiltrate, pleural effusion or pneumothorax. Bones unremarkable. IMPRESSION: No acute abnormalities. Electronically Signed   By: Lavonia Dana M.D.   On: 03/12/2018 16:48    Assessment and Plan:   BELFORD PASCUCCI is a 68 y.o. male with a hx of CAD status post remote PCI to the LAD and RCA, hyperlipidemia, obstructive sleep apnea on CPAP and prostate cancer who presented to the ED on 3/7 with chief complaint of left-sided chest pain, generalized malaise, weakness, chills, n/v.  In the ED he met sepsis criteria and found to have positive blood cultures growing enterococcus species and Staphylococcus.  Influenza and respiratory panels negative.  Also found to have elevated troponin.  He is being seen today for evaluation of chest pain and elevated troponin at the request of Dr. Marthenia Rolling, Internal Medicine.    1.  Chest pain with elevated troponin: Recent  left-sided chest discomfort was similar to previous angina and relieved with aspirin.  EKG shows normal sinus rhythm.  No ST/T wave abnormalities.  Troponin levels mildly elevated with upward trend 0.06>> 0.12>>> 0.29.  Echocardiogram with normal LV function and no wall motion abnormalities.  He is currently chest pain-free and on IV heparin.  While it is possible that his recent chest discomfort and elevated troponin may be secondary to demand ischemia in the setting of underlying CAD and infection, would recommend at some point that the patient undergo repeat ischemic testing given his known history and patient being lost to follow-up for several years.  However would prefer to wait until he is more medically stable from an ID standpoint.  Not a candidate for invasive cardiac cath currently given his known infection. We can consider noninvasive nuclear stress testing once his infection resolves.    2.  Sepsis: In the ED patient was tachycardic, tachypneic and febrile with leukocytosis.  Found to be bacteremic.  Source is unknown at this time.  He on antibiotics.  Hemodynamics improved.  Normal sinus rhythm on telemetry.  Currently afebrile.  Respirations normal.  White count now within normal limits down from 13.9-7.8.  3.  Bacteremia: Blood cultures positive for enterococcus species and Staphylococcus.  Etiology unknown.  Patient denies any recent dental work, articular injections or colonoscopies.  He did have a prostate biopsy but this was several months ago in the fall of 2019.  He is on antibiotics per infectious disease, ampicillin-Sulbactam.  Transthoracic echocardiogram unremarkable for vegetations however suspect he will need a transesophageal echocardiogram for definitive rule out of endocarditis.  4.  CAD: History of non-STEMI in 2004 with coronary stenting to the RCA.  His last cardiac catheterization was in 2009 and he required PCI to the LAD.  He was previously followed by Dr. Romeo Apple  but did not establish care with another cardiologist after his departure.  He did have recent chest discomfort that felt similar to his chest discomfort in 2004 when he had his non-STEMI.  This was relieved with aspirin.  His troponins are also mildly elevated but also in the setting of infection.  While it is possible that his recent chest discomfort and elevated troponin may be secondary to demand ischemia in the setting of underlying CAD and infection, would recommend at some point that the patient undergo repeat ischemic testing given his known history and patient being lost to follow-up  for several years.  However would prefer to wait until he is more medically stable from an ID standpoint.  He is currently chest pain-free and on IV heparin and his echocardiogram is reassuring.  Systolic function is normal and there are no wall motion abnormalities.  We can consider noninvasive nuclear stress testing once his infection resolves. Continue ASA. Recommend adding statin.   5.  Hyperlipidemia: Per review of home meds, patient is not on statin therapy.  Lipid panel 03/13/2018 showed elevated LDL at 100 mg/dL.  Comprehensive metabolic panel shows normal hepatic enzymes.  He does have known multivessel coronary artery disease.  LDL goal should be less than 70.  Recommend initiation of statin therapy.  Consider atorvastatin 40.  Repeat fasting lipid panel and hepatic function test as an outpatient in 6 to 8 weeks.  6.  Obstructive sleep apnea: On CPAP.  He reports full compliance at home.  7. Prostate CA: Recently diagnosed in the fall 2019.  No treatment thus far, however patient reports that he was previously on testosterone and this has been discontinued.  Patient is being followed by urology.    For questions or updates, please contact [place] Please consult [email or web address] for contact info under   Signed, Lyda Jester, PA-C  03/14/2018 8:19 AM  The patient was seen, examined and discussed with  Brittainy M. Ladoris Gene and I agree with the above.   68 y.o. male with a hx of CAD status post remote PCI to the LAD and RCA, hyperlipidemia, obstructive sleep apnea on CPAP and prostate cancer who presented to the ED on 3/7 with chief complaint of left-sided chest pain, fever/chills, weakness that started on Friday 3/6. He was found to positive blood cultures growing enterococcus species and Staphylococcus.  Influenza and respiratory panels negative.  Also found to have elevated troponin 0.12->0.29->0.23. On Heparin drip, iv ATB, currently asymptomatic.  Physical exam shows no JVD, RRR, no murmurs, lungs with mild crackles at the bases, warm extremities with LE edema.  Echo shows LVEF 60-65%, moderate LVH, normal wall motion, grade 1 DD, indeterminate LV filling pressure, mild LAE, mild MR, mild TR, RVSP 29 mmHg + RAP, IVC not visualized. No vegetations. ECG shows SR, normal ECG, no ischemic changes. His last cath was in 2009 and he underwent stenting to the LAD at that time.  He also has a prior history of RCA stents.   Plan:  Treatment of bacteremia by primary team. Stress test vs cath once sepsis improved, negative blood cultures.  Symptoms were typical as during NSTEMI, however the patient was febrile during the episode, no prior chest pain in the last 10 years. This is possibly demand ischemia, LVEF is normal with no regional wall motion abnormalities, I would inclined to an outpatient D SPECT stress test once he recovers from an acute illness.   [name], MD 03/14/2018

## 2018-03-14 NOTE — Progress Notes (Signed)
[place] for heparin Indication: elevated troponin in setting of sepsis  Allergies  Allergen Reactions  . Lisinopril Cough    Patient Measurements: Height: 5\' 7"  (170.2 cm) Weight: 236 lb 12.4 oz (107.4 kg) IBW/kg (Calculated) : 66.1 Heparin Dosing Weight: 90.1 kg  Vital Signs: Temp: 99.3 F (37.4 C) (03/08 0800) Temp Source: Oral (03/08 0800) BP: 142/75 (03/08 0800) Pulse Rate: 80 (03/08 0800)  Labs: Recent Labs    03/12/18 1655 03/12/18 1750  03/12/18 1830 03/12/18 2021  03/13/18 0908 03/13/18 1432 03/14/18 0008 03/14/18 0741 03/14/18 0952  HGB  --   --    < > 14.9  --   --  13.7  --   --  13.8  --   HCT  --   --   --  44.6  --   --  41.8  --   --  39.6  --   PLT  --   --   --  258  --   --  185  --   --  172  --   LABPROT  --  14.1  --   --   --   --   --   --   --   --   --   INR  --  1.1  --   --   --   --   --   --   --   --   --   HEPARINUNFRC  --   --   --   --   --   --   --   --  0.12*  --  <0.10*  CREATININE 1.23  --   --   --   --   --  1.14  --   --   --   --   CKTOTAL  --   --   --   --  159  --   --   --   --   --   --   TROPONINI  --   --   --   --  <0.03   < > 0.12* 0.29*  --  0.23*  --    < > = values in this interval not displayed.    Estimated Creatinine Clearance: 74.5 mL/min (by C-G formula based on SCr of 1.14 mg/dL).   Medical History: Past Medical History:  Diagnosis Date  . Chest pain   . Heart attack ([place])   . Hypercholesterolemia   . Hyperlipidemia   . Ischemic heart disease September 2004   Known--with non-Q-wave myocardial infarction   . Obesity   . Sleep apnea     Medications:  Scheduled:  . aspirin  81 mg Oral Daily    Assessment: 79 yom found to have enterococcal bacteremia, now awaiting TEE to rule out endocarditis. Has hx of CAD with stents. Now found to have increasing troponin (0.02>0.06>0.29) with an EKG 3/6 showing sinus tachycardia and no reports of chest pain per  nursing.   Heparin level this morning came back undetectable, on 1300 units/hr. Trop trending down from 0.29 to 0.23. Hgb 13.8, plt 172. No s/sx of bleeding. No infusion issues- IV was changed to [facility] overnight since was beeping every 15 minutes; however, no longer having issues.   Goal of Therapy:  Heparin level 0.3-0.7 units/ml Monitor platelets by anticoagulation protocol: Yes   Plan:  Increase heparin infusion to 1600 units/hr Check anti-Xa level in 6 hours and  daily while on heparin Continue to monitor H&H and platelets   [name], PharmD, [place] Pharmacist  Pager: [phone] Phone: [phone] 03/14/2018,12:23 PM

## 2018-03-14 NOTE — Plan of Care (Signed)
Care plans reviewed and patient is progressing.  

## 2018-03-14 NOTE — Progress Notes (Signed)
PROGRESS NOTE    Keith KAZEE  Murillo:[id number] DOB: Feb 03, 1950 DOA: 03/12/2018 PCP: [name], FNP  Outpatient Specialists:   Brief Narrative:  Keith Murillo is a 68 y.o. male with medical history significant of Prostate Cancer recent diagnosis, Erectile Dysfunction, CAD status post multiple stent placement, HLD, OSA on CPAP.  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  Apparently, patient was helping out people who had known flu.  Patient's chest pain seems to have improved, but the troponin has been on the rise.  Echocardiogram is pending.  T-max is 103.1 F.  Influenza A and B came back negative.  Respiratory viral panel by PCR came back negative.  Blood culture (BC ID) is positive for enterococcus species and Staphylococcus.  Patient is currently on vancomycin and the Unasyn.  03/14/2018: Cardiology input is appreciated.  Chest pain has resolved significantly.  Patient is still on heparin Drip.  Blood culture has grown Enterococcus faecalis as well as coagulase-negative Staphylococcus.  The coagulase-negative staph may be contaminant.  Follow repeat cultures.  Follow final culture results.  Infectious disease input is also appreciated.  Fever seems to be improving.  Patient looks much better today.  Patient was seen alongside patient's wife and daughter.  Assessment & Plan:   Active Problems:   Coronary artery disease involving native heart without angina pectoris   Hyperlipidemia   Sepsis ([place])   Chest pain   Chest pain -currently resolved in the setting of acute illness.  And sepsis.  We will continue to cycle cardiac enzymes patient will likely benefit from further cardiac evaluation please consult cardiology in the morning.  Given history of CAD and symptoms similar to cardiac etiology  Monitor on telemetry and obtain echo 03/13/2018: Chest pain has improved.  However, troponin has trended upwards from 0.06 to 0 0.12 to 0.29.  Will await  cardiology.  States start patient on heparin.  Monitor closely as patient has history of hematuria. 03/14/2018: Cardiology input is appreciated.  Troponin this morning is 0.23.  EKG remains nonrevealing.  Chest pain has resolved.  Cardiology team is directing care.  Heparin drip is continued for now.  . Coronary artery disease involving native heart -will need further work-up given exertional chest pain.  Continue to observe on telemetry and cycle cardiac enzymes check lipid panel in the morning. 03/26/2018: Kindly see above.  . Hyperlipidemia: Lipid panel in a.m.  Mar[place] Kitchen Sepsis (HCC)-Sirs etiology unclear could be urinary.  Possibly patient passed a stone given straining and hematuria that she currently has improved.  Also evaluate for viral etiology.  Chest x-ray unremarkable.  For now order blood cultures and urine cultures broad spectrum antibiotics. 03/13/2018: BCID is positive for enterococcus and Staphylococcus.  Infectious disease input is appreciated.  Patient is currently on IV vancomycin and Unasyn.  Will repeat blood culture.  Likely TEE for persistent bacteremia, however, will defer to the infectious disease team.  03/14/2018: Cultures growing Enterococcus faecalis.  Final cultures are pending.  Blood culture also grew coagulase-negative Staphylococcus, possible contaminant.  We will follow repeat blood cultures.  Infectious disease input is appreciated.  We will continue current antibiotics for now.  Recent history of prostate cancer: Bladder thickening is noted. Follow with urology on discharge. Patient may need cystoscopy. Monitor closely as patient is at risk for hematuria.  DVT prophylaxis: Heparin drip.  Monitor closely for bleeding.   Code Status: Full code Family Communication: Patient's daughter Disposition Plan: This will depend on  hospital course.  Consultants:   Infectious disease  Cardiology.  Procedures:   Echocardiogram  Antimicrobials:   IV vancomycin  IV the  Unasyn    Subjective: Chest pain has resolved. Fever documented, but improving. Patient looks and feels better today.  Objective: Vitals:   03/13/18 2331 03/14/18 0313 03/14/18 0800 03/14/18 1500  BP: 130/72 (!) 142/88 (!) 142/75 137/77  Pulse: 68 84 80   Resp: (!) 21 19 (!) 30 (!) 22  Temp: 99.6 F (37.6 C) 98.8 F (37.1 C) 99.3 F (37.4 C) 99 F (37.2 C)  TempSrc: Oral Oral Oral Oral  SpO2: 95% 94% 97% 97%  Weight:      Height:        Intake/Output Summary (Last 24 hours) at 03/14/2018 1643 Last data filed at 03/14/2018 1500 Gross per 24 hour  Intake 1263.52 ml  Output 2175 ml  Net -911.48 ml   Filed Weights   03/12/18 1607 03/12/18 2312  Weight: 105.2 kg 107.4 kg    Examination:  General exam: Appears calm and comfortable  Respiratory system: Clear to auscultation.  Cardiovascular system: S1 & S2 heard Gastrointestinal system: Abdomen is nondistended, soft and nontender. No organomegaly or masses felt. Normal bowel sounds heard. Central nervous system: Alert and oriented. No focal neurological deficits. Extremities: There is no leg edema.  Data Reviewed: I have personally reviewed following labs and imaging studies  CBC: Recent Labs  Lab 03/12/18 1830 03/13/18 0908 03/14/18 0741  WBC 11.0* 13.9* 7.8  NEUTROABS 10.4*  --   --   HGB 14.9 13.7 13.8  HCT 44.6 41.8 39.6  MCV 88.0 88.6 86.3  PLT 258 185 297   Basic Metabolic Panel: Recent Labs  Lab 03/12/18 1655 03/13/18 0908  NA 139 138  K 3.6 4.0  CL 107 106  CO2 21* 23  GLUCOSE 133* 112*  BUN 15 15  CREATININE 1.23 1.14  CALCIUM 9.3 8.6*  MG  --  1.7  PHOS  --  3.6   GFR: Estimated Creatinine Clearance: 74.5 mL/min (by C-G formula based on SCr of 1.14 mg/dL). Liver Function Tests: Recent Labs  Lab 03/12/18 1655 03/13/18 0908  AST 42* 39  ALT 46* 43  ALKPHOS 60 49  BILITOT 1.2 1.0  PROT 5.9* 6.0*  ALBUMIN 3.5 3.3*   Recent Labs  Lab 03/12/18 1655  LIPASE 35   No results for  input(s): AMMONIA in the last 168 hours. Coagulation Profile: Recent Labs  Lab 03/12/18 1750  INR 1.1   Cardiac Enzymes: Recent Labs  Lab 03/12/18 2021 03/13/18 0354 03/13/18 0908 03/13/18 1432 03/14/18 0741  CKTOTAL 159  --   --   --   --   TROPONINI <0.03 0.06* 0.12* 0.29* 0.23*   BNP (last 3 results) No results for input(s): PROBNP in the last 8760 hours. HbA1C: No results for input(s): HGBA1C in the last 72 hours. CBG: No results for input(s): GLUCAP in the last 168 hours. Lipid Profile: Recent Labs    03/13/18 0354  CHOL 152  HDL 39*  LDLCALC 100*  TRIG 66  CHOLHDL 3.9   Thyroid Function Tests: Recent Labs    03/13/18 0354  TSH 0.833   Anemia Panel: No results for input(s): VITAMINB12, FOLATE, FERRITIN, TIBC, IRON, RETICCTPCT in the last 72 hours. Urine analysis:    Component Value Date/Time   COLORURINE YELLOW 03/12/2018 1656   APPEARANCEUR HAZY (A) 03/12/2018 1656   LABSPEC 1.010 03/12/2018 1656   PHURINE 6.0 03/12/2018 1656  GLUCOSEU NEGATIVE 03/12/2018 1656   HGBUR LARGE (A) 03/12/2018 1656   BILIRUBINUR NEGATIVE 03/12/2018 1656   KETONESUR NEGATIVE 03/12/2018 1656   PROTEINUR 30 (A) 03/12/2018 1656   NITRITE NEGATIVE 03/12/2018 1656   LEUKOCYTESUR TRACE (A) 03/12/2018 1656   Sepsis Labs: @LABRCNTIP (procalcitonin:4,lacticidven:4)  ) Recent Results (from the past 240 hour(s))  Culture, blood (Routine x 2)     Status: Abnormal (Preliminary result)   Collection Time: 03/12/18  4:34 PM  Result Value Ref Range Status   Specimen Description BLOOD LEFT HAND  Final   Special Requests   Final    BOTTLES DRAWN AEROBIC AND ANAEROBIC Blood Culture adequate volume   Culture  Setup Time   Final    IN BOTH AEROBIC AND ANAEROBIC BOTTLES GRAM POSITIVE COCCI CRITICAL RESULT CALLED TO, READ BACK BY AND VERIFIED WITH: Ailene Rud [id number] AT 45 BY CM Performed at [facility], [place] [address], [place], [place] [address]    Culture ENTEROCOCCUS  FAECALIS (A)  Final   Report Status PENDING  Incomplete  Culture, blood (Routine x 2)     Status: Abnormal (Preliminary result)   Collection Time: 03/12/18  4:34 PM  Result Value Ref Range Status   Specimen Description BLOOD RIGHT HAND  Final   Special Requests   Final    BOTTLES DRAWN AEROBIC AND ANAEROBIC Blood Culture adequate volume   Culture  Setup Time   Final    GRAM POSITIVE COCCI IN BOTH AEROBIC AND ANAEROBIC BOTTLES CRITICAL VALUE NOTED.  VALUE IS CONSISTENT WITH PREVIOUSLY REPORTED AND CALLED VALUE. Performed at [facility], [place] [address], [place], [place] [address]    Culture (A)  Final    ENTEROCOCCUS FAECALIS STAPHYLOCOCCUS SPECIES (COAGULASE NEGATIVE)    Report Status PENDING  Incomplete  Blood Culture ID Panel (Reflexed)     Status: Abnormal   Collection Time: 03/12/18  4:34 PM  Result Value Ref Range Status   Enterococcus species DETECTED (A) NOT DETECTED Final    Comment: CRITICAL RESULT CALLED TO, READ BACK BY AND VERIFIED WITH: PHARMD M MACCIA [id number] AT 1012 BY CM    Vancomycin resistance NOT DETECTED NOT DETECTED Final   Listeria monocytogenes NOT DETECTED NOT DETECTED Final   Staphylococcus species DETECTED (A) NOT DETECTED Final    Comment: Methicillin (oxacillin) susceptible coagulase negative staphylococcus. Possible blood culture contaminant (unless isolated from more than one blood culture draw or clinical case suggests pathogenicity). No antibiotic treatment is indicated for blood  culture contaminants. CRITICAL RESULT CALLED TO, READ BACK BY AND VERIFIED WITH: PHARMD M [place] [id number] AT 1012 BY CM    Staphylococcus aureus (BCID) NOT DETECTED NOT DETECTED Final   Methicillin resistance NOT DETECTED NOT DETECTED Final   Streptococcus species NOT DETECTED NOT DETECTED Final   Streptococcus agalactiae NOT DETECTED NOT DETECTED Final   Streptococcus pneumoniae NOT DETECTED NOT DETECTED Final   Streptococcus pyogenes NOT DETECTED NOT DETECTED Final    Acinetobacter baumannii NOT DETECTED NOT DETECTED Final   Enterobacteriaceae species NOT DETECTED NOT DETECTED Final   Enterobacter cloacae complex NOT DETECTED NOT DETECTED Final   Escherichia coli NOT DETECTED NOT DETECTED Final   Klebsiella oxytoca NOT DETECTED NOT DETECTED Final   Klebsiella pneumoniae NOT DETECTED NOT DETECTED Final   Proteus species NOT DETECTED NOT DETECTED Final   Serratia marcescens NOT DETECTED NOT DETECTED Final   Haemophilus influenzae NOT DETECTED NOT DETECTED Final   Neisseria meningitidis NOT DETECTED NOT DETECTED Final  Pseudomonas aeruginosa NOT DETECTED NOT DETECTED Final   Candida albicans NOT DETECTED NOT DETECTED Final   Candida glabrata NOT DETECTED NOT DETECTED Final   Candida krusei NOT DETECTED NOT DETECTED Final   Candida parapsilosis NOT DETECTED NOT DETECTED Final   Candida tropicalis NOT DETECTED NOT DETECTED Final    Comment: Performed at [facility], [place] [address] [place], [place] [address]  Respiratory Panel by PCR     Status: None   Collection Time: 03/12/18  5:33 PM  Result Value Ref Range Status   Adenovirus NOT DETECTED NOT DETECTED Final   Coronavirus 229E NOT DETECTED NOT DETECTED Final    Comment: (NOTE) The Coronavirus on the Respiratory Panel, DOES NOT test for the novel  Coronavirus (2019 nCoV)    Coronavirus HKU1 NOT DETECTED NOT DETECTED Final   Coronavirus NL63 NOT DETECTED NOT DETECTED Final   Coronavirus OC43 NOT DETECTED NOT DETECTED Final   Metapneumovirus NOT DETECTED NOT DETECTED Final   Rhinovirus / Enterovirus NOT DETECTED NOT DETECTED Final   Influenza A NOT DETECTED NOT DETECTED Final   Influenza B NOT DETECTED NOT DETECTED Final   Parainfluenza Virus 1 NOT DETECTED NOT DETECTED Final   Parainfluenza Virus 2 NOT DETECTED NOT DETECTED Final   Parainfluenza Virus 3 NOT DETECTED NOT DETECTED Final   Parainfluenza Virus 4 NOT DETECTED NOT DETECTED Final   Respiratory Syncytial Virus NOT DETECTED NOT  DETECTED Final   Bordetella pertussis NOT DETECTED NOT DETECTED Final   Chlamydophila pneumoniae NOT DETECTED NOT DETECTED Final   Mycoplasma pneumoniae NOT DETECTED NOT DETECTED Final    Comment: Performed at [facility], [place]. [address] [place], [place] [address]  Urine culture     Status: None (Preliminary result)   Collection Time: 03/12/18 10:14 PM  Result Value Ref Range Status   Specimen Description URINE, RANDOM  Final   Special Requests NONE  Final   Culture   Final    CULTURE REINCUBATED FOR BETTER GROWTH Performed at [facility], [place] [address], [place], [place] [address]    Report Status PENDING  Incomplete         Radiology Studies: Ct Abdomen Pelvis W Contrast  Result Date: 03/12/2018 CLINICAL DATA:  Left-sided chest pain.  Fever and vomiting. EXAM: CT ABDOMEN AND PELVIS WITH CONTRAST TECHNIQUE: Multidetector CT imaging of the abdomen and pelvis was performed using the standard protocol following bolus administration of intravenous contrast. CONTRAST:  17mL OMNIPAQUE IOHEXOL 300 MG/ML  SOLN COMPARISON:  None. FINDINGS: Lower chest: Top normal heart size without pericardial effusion. At least three-vessel coronary arteriosclerosis is noted. Dependent bibasilar atelectasis is seen. Hepatobiliary: Steatosis of the liver. Gallbladder is unremarkable and free of stones. No biliary dilatation. No hepatic mass is identified. Pancreas: Normal Spleen: Normal Adrenals/Urinary Tract: Normal bilateral adrenal glands, kidneys and ureters without evidence of obstructive uropathy. Asymmetric eccentric thickening of the anterior right lateral wall of the urinary bladder in part due to infolding of the bladder. Focal cystitis or a subtle mucosal bladder lesion is not entirely excluded. Consider direct visual correlation. Focal inflammatory change or mucosal lesion is not entirely excluded. Stomach/Bowel: Small hiatal hernia. Decompressed stomach. Normal small bowel rotation. Normal  appearing appendix. Average stool retention within the colon without bowel obstruction or inflammation. Vascular/Lymphatic: Nonaneurysmal atherosclerotic aorta. No lymphadenopathy. Reproductive: On the large prostate with central zone calcifications. Other: Fat containing inguinal hernia.  No free air nor free fluid. Musculoskeletal: Degenerative disc disease of the lower  lumbar spine. Multilevel mild facet arthropathy and hypertrophy. IMPRESSION: 1. Hepatic steatosis. 2. Focal right-sided anterior bladder thickening in part due to infolding of the bladder. A mucosal lesion or focal cystitis are among differential possibilities. Consider direct visual correlation as deemed clinically warranted or repeat imaging with better bladder distension and opacification of the urine. 3. Coronary arteriosclerosis. 4. Fat containing right inguinal hernia. Electronically Signed   By: [name] M.D.   On: 03/12/2018 19:23        Scheduled Meds: . aspirin  81 mg Oral Daily   Continuous Infusions: . ampicillin-sulbactam (UNASYN) IV 3 g (03/14/18 1454)  . heparin 1,300 Units/hr (03/14/18 1452)     LOS: 1 day    Time spent: 35-minute   Dana Allan, MD  [facility] Pager #: [phone] 7PM-7AM contact night coverage as above

## 2018-03-14 NOTE — Progress Notes (Signed)
[place] for heparin Indication: elevated troponin in setting of sepsis  Allergies  Allergen Reactions  . Lisinopril Cough    Patient Measurements: Height: 5\' 7"  (170.2 cm) Weight: 236 lb 12.4 oz (107.4 kg) IBW/kg (Calculated) : 66.1 Heparin Dosing Weight: 90.1 kg  Vital Signs: Temp: 102 F (38.9 C) (03/08 1845) Temp Source: Axillary (03/08 1845) BP: 177/97 (03/08 1845) Pulse Rate: 85 (03/08 1845)  Labs: Recent Labs    03/12/18 1655 03/12/18 1750  03/12/18 1830 03/12/18 2021  03/13/18 0908 03/13/18 1432 03/14/18 0008 03/14/18 0741 03/14/18 0952 03/14/18 1851  HGB  --   --    < > 14.9  --   --  13.7  --   --  13.8  --   --   HCT  --   --   --  44.6  --   --  41.8  --   --  39.6  --   --   PLT  --   --   --  258  --   --  185  --   --  172  --   --   LABPROT  --  14.1  --   --   --   --   --   --   --   --   --   --   INR  --  1.1  --   --   --   --   --   --   --   --   --   --   HEPARINUNFRC  --   --   --   --   --   --   --   --  0.12*  --  <0.10* 0.13*  CREATININE 1.23  --   --   --   --   --  1.14  --   --   --   --   --   CKTOTAL  --   --   --   --  159  --   --   --   --   --   --   --   TROPONINI  --   --   --   --  <0.03   < > 0.12* 0.29*  --  0.23*  --   --    < > = values in this interval not displayed.    Estimated Creatinine Clearance: 74.5 mL/min (by C-G formula based on SCr of 1.14 mg/dL).  Assessment: 89 yom found to have enterococcal bacteremia, now awaiting TEE to rule out endocarditis. Has hx of CAD with stents. Now found to have increasing troponin (0.02>0.06>0.29) with an EKG 3/6 showing sinus tachycardia and no reports of chest pain per nursing.   Hep lvl low at 0.13  Goal of Therapy:  Heparin level 0.3-0.7 units/ml Monitor platelets by anticoagulation protocol: Yes   Plan:  Heparin bolus 1500 units x 1 Increase heparin infusion to 1500 units/hr 0200 hep lvl  Levester Fresh, PharmD, BCPS,  BCCCP Clinical Pharmacist [phone]  Please check AMION for all [place] numbers  03/14/2018 7:39 PM

## 2018-03-15 DIAGNOSIS — R079 Chest pain, unspecified: Secondary | ICD-10-CM

## 2018-03-15 LAB — CBC
HCT: 38.2 % — ABNORMAL LOW (ref 39.0–52.0)
Hemoglobin: 13.5 g/dL (ref 13.0–17.0)
MCH: 30.1 pg (ref 26.0–34.0)
MCHC: 35.3 g/dL (ref 30.0–36.0)
MCV: 85.1 fL (ref 80.0–100.0)
Platelets: 156 10*3/uL (ref 150–400)
RBC: 4.49 MIL/uL (ref 4.22–5.81)
RDW: 13.1 % (ref 11.5–15.5)
WBC: 8 10*3/uL (ref 4.0–10.5)
nRBC: 0 % (ref 0.0–0.2)

## 2018-03-15 LAB — CULTURE, BLOOD (ROUTINE X 2): Special Requests: ADEQUATE

## 2018-03-15 LAB — URINE CULTURE: Culture: >=100000 — AB

## 2018-03-15 LAB — LIPID PANEL
Cholesterol: 142 mg/dL (ref 0–200)
HDL: 33 mg/dL — ABNORMAL LOW (ref >40)
LDL Cholesterol: 62 mg/dL (ref 0–99)
Total CHOL/HDL Ratio: 4.3 RATIO
Triglycerides: 233 mg/dL — ABNORMAL HIGH (ref <150)
VLDL: 47 mg/dL — ABNORMAL HIGH (ref 0–40)

## 2018-03-15 LAB — GLUCOSE, CAPILLARY: Glucose-Capillary: 148 mg/dL — ABNORMAL HIGH (ref 70–99)

## 2018-03-15 LAB — HEPARIN LEVEL (UNFRACTIONATED): Heparin Unfractionated: 0.29 IU/mL — ABNORMAL LOW (ref 0.30–0.70)

## 2018-03-15 MED ORDER — VITAMIN C 500 MG PO TABS
500.0000 mg | ORAL_TABLET | Freq: Every day | ORAL | Status: DC
  Administered 2018-03-15 – 2018-03-17 (×3): 500 mg via ORAL
  Filled 2018-03-15 (×3): qty 1

## 2018-03-15 MED ORDER — ONDANSETRON HCL 4 MG/2ML IJ SOLN: 4.0000 mg | Freq: Four times a day (QID) | INTRAMUSCULAR | Status: DC | PRN

## 2018-03-15 MED ORDER — CARVEDILOL 12.5 MG PO TABS
12.5000 mg | ORAL_TABLET | Freq: Two times a day (BID) | ORAL | Status: DC
  Administered 2018-03-15 – 2018-03-17 (×3): 12.5 mg via ORAL
  Filled 2018-03-15 (×3): qty 1

## 2018-03-15 MED ORDER — VITAMIN D-3 125 MCG (5000 UT) PO TABS: 10000.0000 [IU] | ORAL_TABLET | Freq: Every day | ORAL | Status: DC

## 2018-03-15 MED ORDER — ROSUVASTATIN CALCIUM 5 MG PO TABS
10.0000 mg | ORAL_TABLET | Freq: Every day | ORAL | Status: DC
  Administered 2018-03-15 – 2018-03-16 (×2): 10 mg via ORAL
  Filled 2018-03-15 (×2): qty 2

## 2018-03-15 MED ORDER — HEPARIN BOLUS VIA INFUSION: 1500.0000 [IU] | Freq: Once | INTRAVENOUS | Status: DC

## 2018-03-15 MED ORDER — ENOXAPARIN SODIUM 40 MG/0.4ML ~~LOC~~ SOLN
40.0000 mg | SUBCUTANEOUS | Status: DC
  Administered 2018-03-15: 40 mg via SUBCUTANEOUS
  Filled 2018-03-15 (×2): qty 0.4

## 2018-03-15 MED ORDER — ACETAMINOPHEN 325 MG PO TABS: 650.0000 mg | ORAL_TABLET | Freq: Four times a day (QID) | ORAL | Status: DC | PRN

## 2018-03-15 NOTE — Progress Notes (Signed)
Pharmacist called after got result from morning Lab drawn, needed to increase Heparin to 1600 unit/ hr. Pt temperature was 99.2 F at 04:00 am,he was getting better than in the evening, yesterday was 102 F. Pt had no chest pain tonight. No acute distress seen. Continue to monitor.  Kennyth Lose, BSN,RN,PCCN,CMC,CSC

## 2018-03-15 NOTE — Progress Notes (Addendum)
[place] for Heparin Indication: Elevated troponin   Allergies  Allergen Reactions  . Lisinopril Cough    Patient Measurements: Height: 5\' 7"  (170.2 cm) Weight: 236 lb 12.4 oz (107.4 kg) IBW/kg (Calculated) : 66.1 Heparin Dosing Weight: 90.1 kg  Vital Signs: Temp: 99.9 F (37.7 C) (03/08 2233) Temp Source: Oral (03/08 2233) BP: 146/65 (03/08 2233) Pulse Rate: 88 (03/08 2233)  Labs: Recent Labs    03/12/18 1655 03/12/18 1750  03/12/18 2021  03/13/18 0908 03/13/18 1432  03/14/18 0741 03/14/18 0952 03/14/18 1851 03/15/18 0226  HGB  --   --    < >  --   --  13.7  --   --  13.8  --   --  13.5  HCT  --   --    < >  --   --  41.8  --   --  39.6  --   --  38.2*  PLT  --   --    < >  --   --  185  --   --  172  --   --  156  LABPROT  --  14.1  --   --   --   --   --   --   --   --   --   --   INR  --  1.1  --   --   --   --   --   --   --   --   --   --   HEPARINUNFRC  --   --   --   --   --   --   --    < >  --  <0.10* 0.13* 0.29*  CREATININE 1.23  --   --   --   --  1.14  --   --   --   --   --   --   CKTOTAL  --   --   --  159  --   --   --   --   --   --   --   --   TROPONINI  --   --   --  <0.03   < > 0.12* 0.29*  --  0.23*  --   --   --    < > = values in this interval not displayed.    Estimated Creatinine Clearance: 74.5 mL/min (by C-G formula based on SCr of 1.14 mg/dL).   Medical History: Past Medical History:  Diagnosis Date  . Chest pain   . Heart attack ([place])   . Hypercholesterolemia   . Hyperlipidemia   . Ischemic heart disease September 2004   Known--with non-Q-wave myocardial infarction   . Obesity   . Sleep apnea     Medications:  Scheduled:  . aspirin  81 mg Oral Daily    Assessment: 54 yom found to have enterococcal bacteremia, now awaiting TEE to rule out endocarditis. Has hx of CAD with stents. Now found to have increasing troponin (0.02>0.06>0.29) with an EKG 3/6 showing sinus tachycardia and no  reports of chest pain per nursing.   Heparin level this morning came back undetectable, on 1300 units/hr. Trop trending down from 0.29 to 0.23. Hgb 13.8, plt 172. No s/sx of bleeding. No infusion issues- IV was changed to [facility] overnight since was beeping every 15 minutes; however, no longer having issues.   3/9 AM update: heparin level  low but trending up, no issues per RN.   Goal of Therapy:  Heparin level 0.3-0.7 units/ml Monitor platelets by anticoagulation protocol: Yes   Plan:  Increase heparin infusion to 1600 units/hr Follow up daily labs  Thank you Anette Guarneri, PharmD [phone]

## 2018-03-15 NOTE — Progress Notes (Signed)
Progress Note  Patient Name: Keith Murillo Date of Encounter: 03/15/2018  Primary Cardiologist: No primary care provider on file. New- Dr [name]  Subjective   Feels much better today. No chest pain. No dyspnea.   Inpatient Medications    Scheduled Meds: . aspirin  81 mg Oral Daily   Continuous Infusions: . ampicillin-sulbactam (UNASYN) IV 3 g (03/15/18 0951)  . heparin 1,600 Units/hr (03/15/18 0358)   PRN Meds: acetaminophen **OR** acetaminophen, HYDROcodone-acetaminophen, ondansetron **OR** ondansetron (ZOFRAN) IV   Vital Signs    Vitals:   03/15/18 0054 03/15/18 0358 03/15/18 0400 03/15/18 0953  BP:  (!) 151/89 (!) 151/89 (!) 141/89  Pulse:  82 81   Resp: 17 (!) 21 (!) 29 (!) 27  Temp:  99.2 F (37.3 C)  98.2 F (36.8 C)  TempSrc:  Oral  Oral  SpO2:  93% 94%   Weight:      Height:        Intake/Output Summary (Last 24 hours) at 03/15/2018 1036 Last data filed at 03/15/2018 1009 Gross per 24 hour  Intake 1481.53 ml  Output 1991 ml  Net -509.47 ml   Last 3 Weights 03/12/2018 03/12/2018 11/19/2017  Weight (lbs) 236 lb 12.4 oz 232 lb 235 lb  Weight (kg) 107.4 kg 105.235 kg 106.595 kg      Telemetry    NSR - Personally Reviewed  ECG    NSR with normal Ecg. - Personally Reviewed  Physical Exam   GEN: No acute distress.   Neck: No JVD Cardiac: RRR, no murmurs, rubs, or gallops.  Respiratory: Clear to auscultation bilaterally. GI: Soft, nontender, non-distended  MS: No edema; No deformity. Neuro:  Nonfocal  Psych: Normal affect   Labs    Chemistry Recent Labs  Lab 03/12/18 1655 03/13/18 0908  NA 139 138  K 3.6 4.0  CL 107 106  CO2 21* 23  GLUCOSE 133* 112*  BUN 15 15  CREATININE 1.23 1.14  CALCIUM 9.3 8.6*  PROT 5.9* 6.0*  ALBUMIN 3.5 3.3*  AST 42* 39  ALT 46* 43  ALKPHOS 60 49  BILITOT 1.2 1.0  GFRNONAA >60 >60  GFRAA >60 >60  ANIONGAP 11 9     Hematology Recent Labs  Lab 03/13/18 0908 03/14/18 0741 03/15/18 0226  WBC  13.9* 7.8 8.0  RBC 4.72 4.59 4.49  HGB 13.7 13.8 13.5  HCT 41.8 39.6 38.2*  MCV 88.6 86.3 85.1  MCH 29.0 30.1 30.1  MCHC 32.8 34.8 35.3  RDW 13.1 13.2 13.1  PLT 185 172 156    Cardiac Enzymes Recent Labs  Lab 03/13/18 0354 03/13/18 0908 03/13/18 1432 03/14/18 0741  TROPONINI 0.06* 0.12* 0.29* 0.23*    Recent Labs  Lab 03/12/18 1657  TROPIPOC 0.02     BNPNo results for input(s): BNP, PROBNP in the last 168 hours.   DDimer No results for input(s): DDIMER in the last 168 hours.   Radiology    No results found.  Cardiac Studies   Echo 03/13/18: IMPRESSIONS    1. The left ventricle has normal systolic function with an ejection fraction of 60-65%. The cavity size was normal. There is moderately increased left ventricular wall thickness. Left ventricular diastolic Doppler parameters are consistent with impaired  relaxation Indeterminent filling pressures The E/e' is 8-15. No evidence of left ventricular regional wall motion abnormalities.  2. The right ventricle has normal systolic function. The cavity was normal. There is no increase in right ventricular wall thickness.  3. Left  atrial size was mildly dilated.  4. The mitral valve is degenerative. Mild thickening of the mitral valve leaflet. Mild calcification of the mitral valve leaflet.  5. The aortic valve was not well visualized.  6. The ascending aorta and aortic root are normal in size and structure.  7. No evidence of left ventricular regional wall motion abnormalities.  8. The interatrial septum was not well visualized.   Patient Profile     68 y.o. male with a hx of CAD status post remote PCI to the LAD and RCA, hyperlipidemia, obstructive sleep apnea on CPAP and prostate cancer who presented to the ED on 3/7 with chief complaint of left-sided chest pain.  In the ED he met sepsis criteria and found to have positive blood cultures growing enterococcus species. Influenza and respiratory panels negative.  Also  found to have elevated troponin.  He is being seen  for evaluation of chest pain and elevated troponin at the request of Dr. Marthenia Rolling, Internal Medicine.   Assessment & Plan    1. Elevated troponin in setting of sepsis and bacteremia. Troponin low level and relatively flat. Ecg normal. Echo shows normal LV function. Low suspicion for ACS. I think this is all demand ischemia. Will continue ASA, add statin and beta blocker. Consider ischemic evaluation after he recovers from infection as outpatient unless he has more angina. Will stop IV heparin.  2. Enterococcus bacteremia/sepsis. Appears to be a urinary source. Antibiotics per primary team.  3. CAD with remote NSTEMI in 2004. S/p stenting of the RCA and LAD. Lost to follow up since 2008.  4. HLD. LDL 100. With history of CAD he should be on a statin. Thinks he may have had issues with statins before but basically hasn't been taking any cardiac medication including ASA. Will start Crestor 10 mg daily. If he develops side effects we could consider alternative lipid lowering therapy. 5. OSA on CPAP 6. History of prostate CA.  7. HTN on no therapy. Given CAD will start Coreg. History of cough on ACEi.       For questions or updates, please contact [place] Please consult [email or web address] for contact info under        Signed, Peter [name], MD  03/15/2018, 10:36 AM

## 2018-03-15 NOTE — Progress Notes (Signed)
[facility] TEAM 1 - Stepdown/ICU TEAM  Keith Murillo  YHC:[id number] DOB: Jul 11, 1950 DOA: 03/12/2018 PCP: [name], FNP    Brief Narrative:  [phone] w/ recently diagnosed prostate CA, CADstatus post multiple stent placement, HLD, and OSAon CPAP who presented  withleft side chest painwhile doing physical labor.   After admit he was found to have a T-max of 103.1 F. Influenza A and B were negative. Respiratory viral panel was negative.  Blood culture was positive for Enterococcus species and Staphylococcus.    Subjective: The patient is resting comfortably in bed.  He denies chest pain or shortness of breath at this time.  He denies nausea or vomiting.  He reports he feels weak in general.  He tells me he understands the need for the TEE and that the procedure has been explained to him.  Assessment & Plan:  Sepsis - Enterococcal and MSSE UTI w/ Bacteremia  Suspected cystitis - on abx per ID - will need TEE per ID - WBC has normalized - fever curve trending down - clinically much improved   Chest pain - Hx of CAD s/p stents  Cardiology is following - for outpatient cardiac eval when clinically stable - EF normal w/ no WMA - stop IV heparin   Hyperlipidemia LDL 62   OSA Nightly CPAP   Prostate CA - diagnoses Fall 2019 follow with Urology on discharge  Hepatic steatosis Incidentally noted on CT abdom 03/12/18 - weight loss advised   Obesity - Body mass index is 37.08 kg/m.   DVT prophylaxis: lovenox  Code Status: FULL CODE Family Communication: no family present at time of exam  Disposition Plan: stable for tele bed   Consultants:  ID  Cardiology   Antimicrobials:  Unasyn 3/7 >  Objective: Blood pressure (!) 141/89, pulse 81, temperature 98.2 F (36.8 C), temperature source Oral, resp. rate (!) 27, height 5\' 7"  (1.702 m), weight 107.4 kg, SpO2 94 %.  Intake/Output Summary (Last 24 hours) at 03/15/2018 1045 Last data filed at 03/15/2018 1009 Gross per 24  hour  Intake 1481.53 ml  Output 1991 ml  Net -509.47 ml   Filed Weights   03/12/18 1607 03/12/18 2312  Weight: 105.2 kg 107.4 kg    Examination: General: No acute respiratory distress Lungs: Clear to auscultation bilaterally without wheezes or crackles Cardiovascular: Regular rate and rhythm without murmur gallop or rub normal S1 and S2 Abdomen: Nontender, nondistended, soft, bowel sounds positive, no rebound, no ascites, no appreciable mass Extremities: No significant cyanosis, clubbing, or edema bilateral lower extremities  CBC: Recent Labs  Lab 03/12/18 1830 03/13/18 0908 03/14/18 0741 03/15/18 0226  WBC 11.0* 13.9* 7.8 8.0  NEUTROABS 10.4*  --   --   --   HGB 14.9 13.7 13.8 13.5  HCT 44.6 41.8 39.6 38.2*  MCV 88.0 88.6 86.3 85.1  PLT 258 185 172 160   Basic Metabolic Panel: Recent Labs  Lab 03/12/18 1655 03/13/18 0908  NA 139 138  K 3.6 4.0  CL 107 106  CO2 21* 23  GLUCOSE 133* 112*  BUN 15 15  CREATININE 1.23 1.14  CALCIUM 9.3 8.6*  MG  --  1.7  PHOS  --  3.6   GFR: Estimated Creatinine Clearance: 74.5 mL/min (by C-G formula based on SCr of 1.14 mg/dL).  Liver Function Tests: Recent Labs  Lab 03/12/18 1655 03/13/18 0908  AST 42* 39  ALT 46* 43  ALKPHOS 60 49  BILITOT 1.2 1.0  PROT 5.9* 6.0*  ALBUMIN 3.5 3.3*   Recent Labs  Lab 03/12/18 1655  LIPASE 35    Coagulation Profile: Recent Labs  Lab 03/12/18 1750  INR 1.1    Cardiac Enzymes: Recent Labs  Lab 03/12/18 2021 03/13/18 0354 03/13/18 0908 03/13/18 1432 03/14/18 0741  CKTOTAL 159  --   --   --   --   TROPONINI <0.03 0.06* 0.12* 0.29* 0.23*    HbA1C: Hgb A1c MFr Bld  Date/Time Value Ref Range Status  10/31/2016 09:21 AM 5.4 4.6 - 6.5 % Final    Comment:    Glycemic Control Guidelines for People with Diabetes:Non Diabetic:  <6%Goal of Therapy: <7%Additional Action Suggested:  >8%      Recent Results (from the past 240 hour(s))  Culture, blood (Routine x 2)      Status: Abnormal (Preliminary result)   Collection Time: 03/12/18  4:34 PM  Result Value Ref Range Status   Specimen Description BLOOD LEFT HAND  Final   Special Requests   Final    BOTTLES DRAWN AEROBIC AND ANAEROBIC Blood Culture adequate volume   Culture  Setup Time   Final    IN BOTH AEROBIC AND ANAEROBIC BOTTLES GRAM POSITIVE COCCI CRITICAL RESULT CALLED TO, READ BACK BY AND VERIFIED WITH: PHARMD M [place] [id number] AT 1012 BY CM    Culture (A)  Final    ENTEROCOCCUS FAECALIS CULTURE REINCUBATED FOR BETTER GROWTH Performed at [facility], [place] [address], [place], [place] [address]    Report Status PENDING  Incomplete   Organism ID, Bacteria ENTEROCOCCUS FAECALIS  Final      Susceptibility   Enterococcus faecalis - MIC*    AMPICILLIN <=2 SENSITIVE Sensitive     VANCOMYCIN 1 SENSITIVE Sensitive     GENTAMICIN SYNERGY SENSITIVE Sensitive     * ENTEROCOCCUS FAECALIS  Culture, blood (Routine x 2)     Status: Abnormal   Collection Time: 03/12/18  4:34 PM  Result Value Ref Range Status   Specimen Description BLOOD RIGHT HAND  Final   Special Requests   Final    BOTTLES DRAWN AEROBIC AND ANAEROBIC Blood Culture adequate volume   Culture  Setup Time   Final    GRAM POSITIVE COCCI IN BOTH AEROBIC AND ANAEROBIC BOTTLES CRITICAL VALUE NOTED.  VALUE IS CONSISTENT WITH PREVIOUSLY REPORTED AND CALLED VALUE.    Culture (A)  Final    ENTEROCOCCUS FAECALIS STAPHYLOCOCCUS EPIDERMIDIS SUSCEPTIBILITIES PERFORMED ON PREVIOUS CULTURE WITHIN THE LAST 5 DAYS. FOR ENTEROCOCCUS FAECALIS Performed at [facility], [place] [address] [place], [place] [address]    Report Status 03/15/2018 FINAL  Final   Organism ID, Bacteria STAPHYLOCOCCUS EPIDERMIDIS  Final      Susceptibility   Staphylococcus epidermidis - MIC*    CIPROFLOXACIN <=0.5 SENSITIVE Sensitive     ERYTHROMYCIN <=0.25 SENSITIVE Sensitive     GENTAMICIN <=0.5 SENSITIVE Sensitive     OXACILLIN <=0.25 SENSITIVE Sensitive      TETRACYCLINE <=1 SENSITIVE Sensitive     VANCOMYCIN <=0.5 SENSITIVE Sensitive     TRIMETH/SULFA 160 RESISTANT Resistant     CLINDAMYCIN <=0.25 SENSITIVE Sensitive     RIFAMPIN <=0.5 SENSITIVE Sensitive     Inducible Clindamycin NEGATIVE Sensitive     * STAPHYLOCOCCUS EPIDERMIDIS  Blood Culture ID Panel (Reflexed)     Status: Abnormal   Collection Time: 03/12/18  4:34 PM  Result Value Ref Range Status   Enterococcus species DETECTED (A) NOT DETECTED Final    Comment: CRITICAL RESULT  CALLED TO, READ BACK BY AND VERIFIED WITH: PHARMD M Bertrand [id number] AT 1012 BY CM    Vancomycin resistance NOT DETECTED NOT DETECTED Final   Listeria monocytogenes NOT DETECTED NOT DETECTED Final   Staphylococcus species DETECTED (A) NOT DETECTED Final    Comment: Methicillin (oxacillin) susceptible coagulase negative staphylococcus. Possible blood culture contaminant (unless isolated from more than one blood culture draw or clinical case suggests pathogenicity). No antibiotic treatment is indicated for blood  culture contaminants. CRITICAL RESULT CALLED TO, READ BACK BY AND VERIFIED WITH: PHARMD M Bonifay [id number] AT 1012 BY CM    Staphylococcus aureus (BCID) NOT DETECTED NOT DETECTED Final   Methicillin resistance NOT DETECTED NOT DETECTED Final   Streptococcus species NOT DETECTED NOT DETECTED Final   Streptococcus agalactiae NOT DETECTED NOT DETECTED Final   Streptococcus pneumoniae NOT DETECTED NOT DETECTED Final   Streptococcus pyogenes NOT DETECTED NOT DETECTED Final   Acinetobacter baumannii NOT DETECTED NOT DETECTED Final   Enterobacteriaceae species NOT DETECTED NOT DETECTED Final   Enterobacter cloacae complex NOT DETECTED NOT DETECTED Final   Escherichia coli NOT DETECTED NOT DETECTED Final   Klebsiella oxytoca NOT DETECTED NOT DETECTED Final   Klebsiella pneumoniae NOT DETECTED NOT DETECTED Final   Proteus species NOT DETECTED NOT DETECTED Final   Serratia marcescens NOT DETECTED NOT DETECTED  Final   Haemophilus influenzae NOT DETECTED NOT DETECTED Final   Neisseria meningitidis NOT DETECTED NOT DETECTED Final   Pseudomonas aeruginosa NOT DETECTED NOT DETECTED Final   Candida albicans NOT DETECTED NOT DETECTED Final   Candida glabrata NOT DETECTED NOT DETECTED Final   Candida krusei NOT DETECTED NOT DETECTED Final   Candida parapsilosis NOT DETECTED NOT DETECTED Final   Candida tropicalis NOT DETECTED NOT DETECTED Final    Comment: Performed at [facility], [address], [place], [place] [address]  Respiratory Panel by PCR     Status: None   Collection Time: 03/12/18  5:33 PM  Result Value Ref Range Status   Adenovirus NOT DETECTED NOT DETECTED Final   Coronavirus 229E NOT DETECTED NOT DETECTED Final    Comment: (NOTE) The Coronavirus on the Respiratory Panel, DOES NOT test for the novel  Coronavirus (2019 nCoV)    Coronavirus HKU1 NOT DETECTED NOT DETECTED Final   Coronavirus NL63 NOT DETECTED NOT DETECTED Final   Coronavirus OC43 NOT DETECTED NOT DETECTED Final   Metapneumovirus NOT DETECTED NOT DETECTED Final   Rhinovirus / Enterovirus NOT DETECTED NOT DETECTED Final   Influenza A NOT DETECTED NOT DETECTED Final   Influenza B NOT DETECTED NOT DETECTED Final   Parainfluenza Virus 1 NOT DETECTED NOT DETECTED Final   Parainfluenza Virus 2 NOT DETECTED NOT DETECTED Final   Parainfluenza Virus 3 NOT DETECTED NOT DETECTED Final   Parainfluenza Virus 4 NOT DETECTED NOT DETECTED Final   Respiratory Syncytial Virus NOT DETECTED NOT DETECTED Final   Bordetella pertussis NOT DETECTED NOT DETECTED Final   Chlamydophila pneumoniae NOT DETECTED NOT DETECTED Final   Mycoplasma pneumoniae NOT DETECTED NOT DETECTED Final    Comment: Performed at [facility], [place]. [address], [place], [place] [address]  Urine culture     Status: Abnormal   Collection Time: 03/12/18 10:14 PM  Result Value Ref Range Status   Specimen Description URINE, RANDOM  Final   Special Requests    Final    NONE Performed at [facility], [place] [address] [place], [place] [address]    Culture (A)  Final    >=100,000 COLONIES/mL ENTEROCOCCUS FAECALIS 80,000 COLONIES/mL STAPHYLOCOCCUS EPIDERMIDIS    Report Status 03/15/2018 FINAL  Final   Organism ID, Bacteria ENTEROCOCCUS FAECALIS (A)  Final   Organism ID, Bacteria STAPHYLOCOCCUS EPIDERMIDIS (A)  Final      Susceptibility   Enterococcus faecalis - MIC*    AMPICILLIN <=2 SENSITIVE Sensitive     LEVOFLOXACIN 0.5 SENSITIVE Sensitive     NITROFURANTOIN <=16 SENSITIVE Sensitive     VANCOMYCIN 1 SENSITIVE Sensitive     * >=100,000 COLONIES/mL ENTEROCOCCUS FAECALIS   Staphylococcus epidermidis - MIC*    CIPROFLOXACIN <=0.5 SENSITIVE Sensitive     GENTAMICIN <=0.5 SENSITIVE Sensitive     NITROFURANTOIN <=16 SENSITIVE Sensitive     OXACILLIN <=0.25 SENSITIVE Sensitive     TETRACYCLINE <=1 SENSITIVE Sensitive     VANCOMYCIN <=0.5 SENSITIVE Sensitive     TRIMETH/SULFA 40 SENSITIVE Sensitive     CLINDAMYCIN <=0.25 SENSITIVE Sensitive     RIFAMPIN <=0.5 SENSITIVE Sensitive     Inducible Clindamycin NEGATIVE Sensitive     * 80,000 COLONIES/mL STAPHYLOCOCCUS EPIDERMIDIS  Culture, blood (routine x 2)     Status: None (Preliminary result)   Collection Time: 03/14/18  7:41 AM  Result Value Ref Range Status   Specimen Description BLOOD RIGHT ANTECUBITAL  Final   Special Requests   Final    BOTTLES DRAWN AEROBIC AND ANAEROBIC Blood Culture adequate volume   Culture   Final    NO GROWTH < 24 HOURS Performed at [facility], [address], [place], [place] [address]    Report Status PENDING  Incomplete  Culture, blood (routine x 2)     Status: None (Preliminary result)   Collection Time: 03/14/18  7:49 AM  Result Value Ref Range Status   Specimen Description BLOOD LEFT HAND  Final   Special Requests   Final    BOTTLES DRAWN AEROBIC AND ANAEROBIC Blood Culture adequate volume   Culture   Final    NO GROWTH < 24  HOURS Performed at [facility], [place] [address] [place], [place] [address]    Report Status PENDING  Incomplete     Scheduled Meds: . aspirin  81 mg Oral Daily   Continuous Infusions: . ampicillin-sulbactam (UNASYN) IV 3 g (03/15/18 0951)  . heparin 1,600 Units/hr (03/15/18 0358)     LOS: 2 days   Cherene Altes, MD [facility] Office  [phone] Pager - Text Page per Amion  If 7PM-7AM, please contact night-coverage per Amion 03/15/2018, 10:45 AM

## 2018-03-15 NOTE — Progress Notes (Signed)
Pt has home CPAP at bedside, able to place on himself when ready. Advised pt to notify for RT if any further assistance is needed.

## 2018-03-15 NOTE — Plan of Care (Signed)
Pt had temperature 102 F this evening. Tylenol given by day shift RN, Con Memos. Rechecked temp again was 99.9 F.  I notified Dr. Guadalupe Maple who is on-call physician tonight. No order received at this time. From RN day shift reported,Dr. Marthenia Rolling and MD cardiologist already made aware. Pt  was alert and orientedx 4, no signs of any distress had seen. EKG was sinus rhythm on monitor, HR 80s, BP stable. Heparin gtt 1500 unit/ hr with 1500 unit bolus as Pharmacist ordered. Will monitor blood work in the morning.  Kennyth Lose, BSN,RN,PCCN-CMC,CSC

## 2018-03-15 NOTE — Evaluation (Signed)
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 After admit he was found to have a T-max of 103.1 F. Blood culture was positive for Enterococcus species and Staphylococcus.  Clinical Impression  Patient seen for therapy assessment.  Mobilizing well with no noted focal deficits at this time. HR stable mid 90s. No chest pain or dizziness. No further acute PT needs. Will sign off.     Follow Up Recommendations No PT follow up    Equipment Recommendations  None recommended by PT    Recommendations for Other Services       Precautions / Restrictions Restrictions Weight Bearing Restrictions: No      Mobility  Bed Mobility Overal bed mobility: Independent                Transfers Overall transfer level: Independent Equipment used: None                Ambulation/Gait Ambulation/Gait assistance: Independent Gait Distance (Feet): 460 Feet Assistive device: None Gait Pattern/deviations: WFL(Within Functional Limits) Gait velocity: modestly decreased Gait velocity interpretation: 1.31 - 2.62 ft/sec, indicative of limited community ambulator General Gait Details: patient steady with ambulation  Stairs Stairs: Yes Stairs assistance: Modified independent (Device/Increase time) Stair Management: One rail Left Number of Stairs: 6 General stair comments: No difficulty  Wheelchair Mobility    Modified Rankin (Stroke Patients Only)       Balance Overall balance assessment: Independent                               Standardized Balance Assessment Standardized Balance Assessment : Dynamic Gait Index   Dynamic Gait Index Level Surface: Normal Change in Gait Speed:  Normal Gait with Horizontal Head Turns: Normal Gait with Vertical Head Turns: Normal Gait and Pivot Turn: Normal Step Over Obstacle: Normal Step Around Obstacles: Normal Steps: Mild Impairment Total Score: 23       Pertinent Vitals/Pain Pain Assessment: No/denies pain    Home Living Family/patient expects to be discharged to:: Private residence Living Arrangements: Spouse/significant other Available Help at Discharge: Family Type of Home: House Home Access: Stairs to enter Entrance Stairs-Rails: Can reach both Entrance Stairs-Number of Steps: 6 Home Layout: Two level;Bed/bath upstairs Home Equipment: None      Prior Function Level of Independence: Independent         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     Hand Dominance   Dominant Hand: Right    Extremity/Trunk Assessment   Upper Extremity Assessment Upper Extremity Assessment: Overall WFL for tasks assessed    Lower Extremity Assessment Lower Extremity Assessment: Overall WFL for tasks assessed       Communication   Communication: No difficulties  Cognition Arousal/Alertness: Awake/alert Behavior During Therapy: WFL for tasks assessed/performed Overall Cognitive Status: Within Functional Limits for tasks assessed                                        General Comments      Exercises     Assessment/Plan    PT Assessment Patent does not need any further PT services  PT Problem List         PT Treatment Interventions      PT Goals (Current goals can be found in the Care Plan section)  Acute Rehab PT Goals PT Goal Formulation: All assessment and education complete, DC therapy    Frequency     Barriers to discharge        Co-evaluation               AM-PAC PT "6 Clicks" Mobility  Outcome Measure Help needed turning from your back to your side while in a flat bed without using bedrails?: None Help needed moving from lying on your back to sitting on the  side of a flat bed without using bedrails?: None Help needed moving to and from a bed to a chair (including a wheelchair)?: None Help needed standing up from a chair using your arms (e.g., wheelchair or bedside chair)?: None Help needed to walk in hospital room?: None Help needed climbing 3-5 steps with a railing? : A Little 6 Click Score: 23    End of Session Equipment Utilized During Treatment: Gait belt Activity Tolerance: Patient tolerated treatment well Patient left: in bed;with call bell/phone within reach;with family/visitor present Nurse Communication: Mobility status PT Visit Diagnosis: Difficulty in walking, not elsewhere classified (R26.2)    Time: 2395-3202 PT Time Calculation (min) (ACUTE ONLY): 19 min   Charges:   PT Evaluation $PT Eval Low Complexity: Keith Murillo, PT DPT  Board Certified Neurologic Specialist Acute Rehabilitation Services Pager [phone] Office [phone]   Keith Murillo 03/15/2018, 4:22 PM

## 2018-03-16 ENCOUNTER — Inpatient Hospital Stay (HOSPITAL_COMMUNITY)
Admit: 2018-03-16 | Discharge: 2018-03-16 | Disposition: A | Discharge status: Home / Self Care | Payer: Medicare Other | Attending: Cardiology | Admitting: Cardiology

## 2018-03-16 ENCOUNTER — Encounter (HOSPITAL_COMMUNITY): Payer: Self-pay

## 2018-03-16 ENCOUNTER — Encounter (HOSPITAL_COMMUNITY)
Admission: EM | Disposition: A | Discharge status: Home / Self Care | Payer: Self-pay | Source: Home / Self Care | Attending: Internal Medicine

## 2018-03-16 DIAGNOSIS — I340 Nonrheumatic mitral (valve) insufficiency: Secondary | ICD-10-CM

## 2018-03-16 DIAGNOSIS — B957 Other staphylococcus as the cause of diseases classified elsewhere: Secondary | ICD-10-CM

## 2018-03-16 HISTORY — PX: TEE WITHOUT CARDIOVERSION: SHX5443

## 2018-03-16 LAB — BASIC METABOLIC PANEL
Anion gap: 9 (ref 5–15)
BUN: 11 mg/dL (ref 8–23)
CO2: 22 mmol/L (ref 22–32)
Calcium: 8.1 mg/dL — ABNORMAL LOW (ref 8.9–10.3)
Chloride: 103 mmol/L (ref 98–111)
Creatinine, Ser: 0.83 mg/dL (ref 0.61–1.24)
GFR calc Af Amer: >60 mL/min (ref >60)
GFR calc non Af Amer: >60 mL/min (ref >60)
Glucose, Bld: 164 mg/dL — ABNORMAL HIGH (ref 70–99)
POTASSIUM: 3.2 mmol/L — AB (ref 3.5–5.1)
Sodium: 134 mmol/L — ABNORMAL LOW (ref 135–145)

## 2018-03-16 LAB — CULTURE, BLOOD (ROUTINE X 2): Special Requests: ADEQUATE

## 2018-03-16 SURGERY — ECHOCARDIOGRAM, TRANSESOPHAGEAL
Anesthesia: Moderate Sedation

## 2018-03-16 MED ORDER — BUTAMBEN-TETRACAINE-BENZOCAINE 2-2-14 % EX AERO
INHALATION_SPRAY | CUTANEOUS | Status: DC | PRN
  Administered 2018-03-16: 2 via TOPICAL

## 2018-03-16 MED ORDER — SODIUM CHLORIDE 0.9 % IV SOLN: INTRAVENOUS | Status: DC

## 2018-03-16 MED ORDER — MIDAZOLAM HCL (PF) 5 MG/ML IJ SOLN
INTRAMUSCULAR | Status: AC
  Filled 2018-03-16: qty 2

## 2018-03-16 MED ORDER — MIDAZOLAM HCL (PF) 10 MG/2ML IJ SOLN
INTRAMUSCULAR | Status: DC | PRN
  Administered 2018-03-16 (×2): 2 mg via INTRAVENOUS

## 2018-03-16 MED ORDER — FENTANYL CITRATE (PF) 100 MCG/2ML IJ SOLN
INTRAMUSCULAR | Status: AC
  Filled 2018-03-16: qty 2

## 2018-03-16 MED ORDER — FENTANYL CITRATE (PF) 100 MCG/2ML IJ SOLN
INTRAMUSCULAR | Status: DC | PRN
  Administered 2018-03-16 (×2): 25 ug via INTRAVENOUS

## 2018-03-16 NOTE — CV Procedure (Signed)
    Transesophageal Echocardiogram Note  Keith Murillo [id number] May 12, 1950  Procedure: Transesophageal Echocardiogram Indications: bacteremia   Procedure Details Consent: Obtained Time Out: Verified patient identification, verified procedure, site/side was marked, verified correct patient position, special equipment/implants available, Radiology Safety Procedures followed,  medications/allergies/relevent history reviewed, required imaging and test results available.  Performed  Medications:  During this procedure the patient is administered a total of Versed 4   mg and Fentanyl 50  mcg  to achieve and maintain moderate conscious sedation.  The patient's heart rate, blood pressure, and oxygen saturation are monitored continuously during the procedure. The period of conscious sedation is 30  minutes, of which I was present face-to-face 100% of this time.  Left Ventrical:  Normal LV   Mitral Valve: mild MR , no vegetation   Aortic Valve:  Trivial AI , no vegetation,   3 leaflet   Tricuspid Valve: trivial TR , no vegetation   Pulmonic Valve: no vegetation,  Trace PI   Left Atrium/ Left atrial appendage: no thrombi   Atrial septum: no ASD o rPFO by color flow   Aorta: normal    Complications: No apparent complications Patient did tolerate procedure well.   Thayer Headings, Brooke Bonito., MD, [facility] 03/16/2018, 11:18 AM

## 2018-03-16 NOTE — Progress Notes (Signed)
Keith Murillo for Infectious Disease  Date of Admission:  03/12/2018     Total days of antibiotics 5         ASSESSMENT/PLAN  Keith Murillo has Enterococcus faecalis and Staphylococcus Epidermidis bacteremia with most likely source being urinary given same organisms present. TEE is negative for endocarditis. Repeat blood cultures are without growth to date. Enterococcus is ampicillin sensitive and will plan to treat for 1 week with Augmentin.  1. Continue current Unasyn while in the hospital. 2. Change to Augmentin 875/125 mg bid at discharge with end date of 03/23/18.  3. Follow up with PCP as needed.  ID will sign off and be available as needed during hospitalizations.   Thank you for the consult.    Active Problems:   Coronary artery disease involving native heart without angina pectoris   Hyperlipidemia   Sepsis ([place])   Chest pain   . aspirin  81 mg Oral Daily  . carvedilol  12.5 mg Oral BID WC  . enoxaparin (LOVENOX) injection  40 mg Subcutaneous Q24H  . rosuvastatin  10 mg Oral q1800  . vitamin C  500 mg Oral Daily    SUBJECTIVE:  Afebrile overnight with no acute events. TEE negative for endocarditis.   Feeling improved today. Throat a little scratchy from TEE. Denies fevers, chills or sweats.    Allergies  Allergen Reactions  . Lisinopril Cough     Review of Systems: Review of Systems  Constitutional: Negative for chills, fever and weight loss.  Respiratory: Negative for cough, shortness of breath and wheezing.   Cardiovascular: Negative for chest pain and leg swelling.  Gastrointestinal: Negative for abdominal pain, constipation, diarrhea, nausea and vomiting.  Skin: Negative for rash.      OBJECTIVE: Vitals:   03/16/18 1115 03/16/18 1120 03/16/18 1126 03/16/18 1132  BP: (!) 159/76 (!) 135/52 (!) 145/83 139/75  Pulse: 69 (!) 55 (!) 56 (!) 58  Resp: (!) 21 18 18 18   Temp:      TempSrc:      SpO2: 98% 97% 96% 94%  Weight:      Height:         Body mass index is 36.65 kg/m.  Physical Exam Constitutional:      General: He is not in acute distress.    Appearance: He is well-developed.     Comments: Lying in bed with head of bed elevated.   Cardiovascular:     Rate and Rhythm: Normal rate and regular rhythm.     Heart sounds: Normal heart sounds. No murmur. No friction rub. No gallop.   Pulmonary:     Effort: Pulmonary effort is normal. No respiratory distress.     Breath sounds: Normal breath sounds. No stridor. No rhonchi or rales.  Abdominal:     General: Bowel sounds are normal.     Tenderness: There is no abdominal tenderness.  Skin:    General: Skin is warm and dry.  Neurological:     Mental Status: He is alert.  Psychiatric:        Mood and Affect: Mood normal.     Lab Results Lab Results  Component Value Date   WBC 8.0 03/15/2018   HGB 13.5 03/15/2018   HCT 38.2 (L) 03/15/2018   MCV 85.1 03/15/2018   PLT 156 03/15/2018    Lab Results  Component Value Date   CREATININE 0.83 03/16/2018   BUN 11 03/16/2018   NA 134 (L) 03/16/2018   K 3.2 (  L) 03/16/2018   CL 103 03/16/2018   CO2 22 03/16/2018    Lab Results  Component Value Date   ALT 43 03/13/2018   AST 39 03/13/2018   ALKPHOS 49 03/13/2018   BILITOT 1.0 03/13/2018     Microbiology: Recent Results (from the past 240 hour(s))  Culture, blood (Routine x 2)     Status: Abnormal   Collection Time: 03/12/18  4:34 PM  Result Value Ref Range Status   Specimen Description BLOOD LEFT HAND  Final   Special Requests   Final    BOTTLES DRAWN AEROBIC AND ANAEROBIC Blood Culture adequate volume   Culture  Setup Time   Final    IN BOTH AEROBIC AND ANAEROBIC BOTTLES GRAM POSITIVE COCCI CRITICAL RESULT CALLED TO, READ BACK BY AND VERIFIED WITH: Ellin Mayhew [place] [id number] AT 16 BY CM Performed at [facility], [place] [address], [place], [place] [address]    Culture (A)  Final    ENTEROCOCCUS FAECALIS STAPHYLOCOCCUS EPIDERMIDIS    Report Status  03/16/2018 FINAL  Final   Organism ID, Bacteria ENTEROCOCCUS FAECALIS  Final   Organism ID, Bacteria STAPHYLOCOCCUS EPIDERMIDIS  Final      Susceptibility   Enterococcus faecalis - MIC*    AMPICILLIN <=2 SENSITIVE Sensitive     VANCOMYCIN 1 SENSITIVE Sensitive     GENTAMICIN SYNERGY SENSITIVE Sensitive     * ENTEROCOCCUS FAECALIS   Staphylococcus epidermidis - MIC*    CIPROFLOXACIN <=0.5 SENSITIVE Sensitive     ERYTHROMYCIN <=0.25 SENSITIVE Sensitive     GENTAMICIN 1 SENSITIVE Sensitive     OXACILLIN <=0.25 SENSITIVE Sensitive     TETRACYCLINE 2 SENSITIVE Sensitive     VANCOMYCIN <=0.5 SENSITIVE Sensitive     TRIMETH/SULFA 160 RESISTANT Resistant     CLINDAMYCIN <=0.25 SENSITIVE Sensitive     RIFAMPIN <=0.5 SENSITIVE Sensitive     Inducible Clindamycin NEGATIVE Sensitive     * STAPHYLOCOCCUS EPIDERMIDIS  Culture, blood (Routine x 2)     Status: Abnormal   Collection Time: 03/12/18  4:34 PM  Result Value Ref Range Status   Specimen Description BLOOD RIGHT HAND  Final   Special Requests   Final    BOTTLES DRAWN AEROBIC AND ANAEROBIC Blood Culture adequate volume   Culture  Setup Time   Final    GRAM POSITIVE COCCI IN BOTH AEROBIC AND ANAEROBIC BOTTLES CRITICAL VALUE NOTED.  VALUE IS CONSISTENT WITH PREVIOUSLY REPORTED AND CALLED VALUE.    Culture (A)  Final    ENTEROCOCCUS FAECALIS STAPHYLOCOCCUS EPIDERMIDIS SUSCEPTIBILITIES PERFORMED ON PREVIOUS CULTURE WITHIN THE LAST 5 DAYS. FOR ENTEROCOCCUS FAECALIS Performed at [facility], [place] [address], [place], [place] [address]    Report Status 03/15/2018 FINAL  Final   Organism ID, Bacteria STAPHYLOCOCCUS EPIDERMIDIS  Final      Susceptibility   Staphylococcus epidermidis - MIC*    CIPROFLOXACIN <=0.5 SENSITIVE Sensitive     ERYTHROMYCIN <=0.25 SENSITIVE Sensitive     GENTAMICIN <=0.5 SENSITIVE Sensitive     OXACILLIN <=0.25 SENSITIVE Sensitive     TETRACYCLINE <=1 SENSITIVE Sensitive     VANCOMYCIN <=0.5 SENSITIVE  Sensitive     TRIMETH/SULFA 160 RESISTANT Resistant     CLINDAMYCIN <=0.25 SENSITIVE Sensitive     RIFAMPIN <=0.5 SENSITIVE Sensitive     Inducible Clindamycin NEGATIVE Sensitive     * STAPHYLOCOCCUS EPIDERMIDIS  Blood Culture ID Panel (Reflexed)     Status: Abnormal   Collection Time: 03/12/18  4:34 PM  Result Value Ref Range Status   Enterococcus species DETECTED (A) NOT DETECTED Final    Comment: CRITICAL RESULT CALLED TO, READ BACK BY AND VERIFIED WITH: PHARMD M [place] [id number] AT 1012 BY CM    Vancomycin resistance NOT DETECTED NOT DETECTED Final   Listeria monocytogenes NOT DETECTED NOT DETECTED Final   Staphylococcus species DETECTED (A) NOT DETECTED Final    Comment: Methicillin (oxacillin) susceptible coagulase negative staphylococcus. Possible blood culture contaminant (unless isolated from more than one blood culture draw or clinical case suggests pathogenicity). No antibiotic treatment is indicated for blood  culture contaminants. CRITICAL RESULT CALLED TO, READ BACK BY AND VERIFIED WITH: PHARMD M [place] [id number] AT 1012 BY CM    Staphylococcus aureus (BCID) NOT DETECTED NOT DETECTED Final   Methicillin resistance NOT DETECTED NOT DETECTED Final   Streptococcus species NOT DETECTED NOT DETECTED Final   Streptococcus agalactiae NOT DETECTED NOT DETECTED Final   Streptococcus pneumoniae NOT DETECTED NOT DETECTED Final   Streptococcus pyogenes NOT DETECTED NOT DETECTED Final   Acinetobacter baumannii NOT DETECTED NOT DETECTED Final   Enterobacteriaceae species NOT DETECTED NOT DETECTED Final   Enterobacter cloacae complex NOT DETECTED NOT DETECTED Final   Escherichia coli NOT DETECTED NOT DETECTED Final   Klebsiella oxytoca NOT DETECTED NOT DETECTED Final   Klebsiella pneumoniae NOT DETECTED NOT DETECTED Final   Proteus species NOT DETECTED NOT DETECTED Final   Serratia marcescens NOT DETECTED NOT DETECTED Final   Haemophilus influenzae NOT DETECTED NOT DETECTED Final    Neisseria meningitidis NOT DETECTED NOT DETECTED Final   Pseudomonas aeruginosa NOT DETECTED NOT DETECTED Final   Candida albicans NOT DETECTED NOT DETECTED Final   Candida glabrata NOT DETECTED NOT DETECTED Final   Candida krusei NOT DETECTED NOT DETECTED Final   Candida parapsilosis NOT DETECTED NOT DETECTED Final   Candida tropicalis NOT DETECTED NOT DETECTED Final    Comment: Performed at [facility], [address], [place], [place] [address]  Respiratory Panel by PCR     Status: None   Collection Time: 03/12/18  5:33 PM  Result Value Ref Range Status   Adenovirus NOT DETECTED NOT DETECTED Final   Coronavirus 229E NOT DETECTED NOT DETECTED Final    Comment: (NOTE) The Coronavirus on the Respiratory Panel, DOES NOT test for the novel  Coronavirus (2019 nCoV)    Coronavirus HKU1 NOT DETECTED NOT DETECTED Final   Coronavirus NL63 NOT DETECTED NOT DETECTED Final   Coronavirus OC43 NOT DETECTED NOT DETECTED Final   Metapneumovirus NOT DETECTED NOT DETECTED Final   Rhinovirus / Enterovirus NOT DETECTED NOT DETECTED Final   Influenza A NOT DETECTED NOT DETECTED Final   Influenza B NOT DETECTED NOT DETECTED Final   Parainfluenza Virus 1 NOT DETECTED NOT DETECTED Final   Parainfluenza Virus 2 NOT DETECTED NOT DETECTED Final   Parainfluenza Virus 3 NOT DETECTED NOT DETECTED Final   Parainfluenza Virus 4 NOT DETECTED NOT DETECTED Final   Respiratory Syncytial Virus NOT DETECTED NOT DETECTED Final   Bordetella pertussis NOT DETECTED NOT DETECTED Final   Chlamydophila pneumoniae NOT DETECTED NOT DETECTED Final   Mycoplasma pneumoniae NOT DETECTED NOT DETECTED Final    Comment: Performed at [facility], [place]. [address] [place], [place] [address]  Urine culture     Status: Abnormal   Collection Time: 03/12/18 10:14 PM  Result Value Ref Range Status   Specimen Description URINE, RANDOM  Final   Special Requests   Final  NONE Performed at [facility], [place]  [address], [place], [place] [address]    Culture (A)  Final    >=100,000 COLONIES/mL ENTEROCOCCUS FAECALIS 80,000 COLONIES/mL STAPHYLOCOCCUS EPIDERMIDIS    Report Status 03/15/2018 FINAL  Final   Organism ID, Bacteria ENTEROCOCCUS FAECALIS (A)  Final   Organism ID, Bacteria STAPHYLOCOCCUS EPIDERMIDIS (A)  Final      Susceptibility   Enterococcus faecalis - MIC*    AMPICILLIN <=2 SENSITIVE Sensitive     LEVOFLOXACIN 0.5 SENSITIVE Sensitive     NITROFURANTOIN <=16 SENSITIVE Sensitive     VANCOMYCIN 1 SENSITIVE Sensitive     * >=100,000 COLONIES/mL ENTEROCOCCUS FAECALIS   Staphylococcus epidermidis - MIC*    CIPROFLOXACIN <=0.5 SENSITIVE Sensitive     GENTAMICIN <=0.5 SENSITIVE Sensitive     NITROFURANTOIN <=16 SENSITIVE Sensitive     OXACILLIN <=0.25 SENSITIVE Sensitive     TETRACYCLINE <=1 SENSITIVE Sensitive     VANCOMYCIN <=0.5 SENSITIVE Sensitive     TRIMETH/SULFA 40 SENSITIVE Sensitive     CLINDAMYCIN <=0.25 SENSITIVE Sensitive     RIFAMPIN <=0.5 SENSITIVE Sensitive     Inducible Clindamycin NEGATIVE Sensitive     * 80,000 COLONIES/mL STAPHYLOCOCCUS EPIDERMIDIS  Culture, blood (routine x 2)     Status: None (Preliminary result)   Collection Time: 03/14/18  7:41 AM  Result Value Ref Range Status   Specimen Description BLOOD RIGHT ANTECUBITAL  Final   Special Requests   Final    BOTTLES DRAWN AEROBIC AND ANAEROBIC Blood Culture adequate volume   Culture   Final    NO GROWTH 2 DAYS Performed at [facility], [address], [place], [place] [address]    Report Status PENDING  Incomplete  Culture, blood (routine x 2)     Status: None (Preliminary result)   Collection Time: 03/14/18  7:49 AM  Result Value Ref Range Status   Specimen Description BLOOD LEFT HAND  Final   Special Requests   Final    BOTTLES DRAWN AEROBIC AND ANAEROBIC Blood Culture adequate volume   Culture   Final    NO GROWTH 2 DAYS Performed at [facility], [place] [address], [place], [place]  [address]    Report Status PENDING  Incomplete     Terri Piedra, [place] for [place] Pager  03/16/2018  2:28 PM

## 2018-03-16 NOTE — Interval H&P Note (Signed)
History and Physical Interval Note:  03/16/2018 10:57 AM  Keith Murillo  has presented today for surgery, with the diagnosis of BACTEREMIA.  The various methods of treatment have been discussed with the patient and family. After consideration of risks, benefits and other options for treatment, the patient has consented to  Procedure(s): TRANSESOPHAGEAL ECHOCARDIOGRAM (TEE) (N/A) as a surgical intervention.  The patient's history has been reviewed, patient examined, no change in status, stable for surgery.  I have reviewed the patient's chart and labs.  Questions were answered to the patient's satisfaction.     Mertie Moores

## 2018-03-16 NOTE — H&P (View-Only) (Signed)
Progress Note  Patient Name: Keith Murillo Date of Encounter: 03/16/2018  Primary Cardiologist: No primary care provider on file. New- Dr [name]  Subjective   Feels much better today. No chest pain. No dyspnea.   Inpatient Medications    Scheduled Meds: . aspirin  81 mg Oral Daily  . carvedilol  12.5 mg Oral BID WC  . enoxaparin (LOVENOX) injection  40 mg Subcutaneous Q24H  . rosuvastatin  10 mg Oral q1800  . vitamin C  500 mg Oral Daily   Continuous Infusions: . sodium chloride 20 mL/hr at 03/16/18 0517  . ampicillin-sulbactam (UNASYN) IV 3 g (03/16/18 0201)   PRN Meds: acetaminophen, HYDROcodone-acetaminophen, ondansetron (ZOFRAN) IV   Vital Signs    Vitals:   03/15/18 1156 03/15/18 1624 03/15/18 2025 03/16/18 0520  BP: (!) 144/87 139/84 122/78 (!) 143/84  Pulse: 62 60 60 61  Resp: (!) 25 (!) 22 16 (!) 21  Temp: 98.6 F (37 C) 98.1 F (36.7 C) 98.3 F (36.8 C) 98.6 F (37 C)  TempSrc: Oral Oral Oral Oral  SpO2: 98% 96% 98% 95%  Weight:    106.5 kg  Height:        Intake/Output Summary (Last 24 hours) at 03/16/2018 0836 Last data filed at 03/16/2018 0517 Gross per 24 hour  Intake 1350 ml  Output 1883 ml  Net -533 ml   Last 3 Weights 03/16/2018 03/12/2018 03/12/2018  Weight (lbs) 234 lb 14.4 oz 236 lb 12.4 oz 232 lb  Weight (kg) 106.55 kg 107.4 kg 105.235 kg      Telemetry    NSR - Personally Reviewed  ECG    NSR with normal Ecg. - Personally Reviewed  Physical Exam   GEN: No acute distress.   Neck: No JVD Cardiac: RRR, no murmurs, rubs, or gallops.  Respiratory: Clear to auscultation bilaterally. GI: Soft, nontender, non-distended  MS: No edema; No deformity. Neuro:  Nonfocal  Psych: Normal affect   Labs    Chemistry Recent Labs  Lab 03/12/18 1655 03/13/18 0908 03/16/18 0251  NA 139 138 134*  K 3.6 4.0 3.2*  CL 107 106 103  CO2 21* 23 22  GLUCOSE 133* 112* 164*  BUN '15 15 11  '$ CREATININE 1.23 1.14 0.83  CALCIUM 9.3 8.6* 8.1*    PROT 5.9* 6.0*  --   ALBUMIN 3.5 3.3*  --   AST 42* 39  --   ALT 46* 43  --   ALKPHOS 60 49  --   BILITOT 1.2 1.0  --   GFRNONAA >60 >60 >60  GFRAA >60 >60 >60  ANIONGAP '11 9 9     '$ Hematology Recent Labs  Lab 03/13/18 0908 03/14/18 0741 03/15/18 0226  WBC 13.9* 7.8 8.0  RBC 4.72 4.59 4.49  HGB 13.7 13.8 13.5  HCT 41.8 39.6 38.2*  MCV 88.6 86.3 85.1  MCH 29.0 30.1 30.1  MCHC 32.8 34.8 35.3  RDW 13.1 13.2 13.1  PLT 185 172 156    Cardiac Enzymes Recent Labs  Lab 03/13/18 0354 03/13/18 0908 03/13/18 1432 03/14/18 0741  TROPONINI 0.06* 0.12* 0.29* 0.23*    Recent Labs  Lab 03/12/18 1657  TROPIPOC 0.02     BNPNo results for input(s): BNP, PROBNP in the last 168 hours.   DDimer No results for input(s): DDIMER in the last 168 hours.   Radiology    No results found.  Cardiac Studies   Echo 03/13/18: IMPRESSIONS    1. The left ventricle has  normal systolic function with an ejection fraction of 60-65%. The cavity size was normal. There is moderately increased left ventricular wall thickness. Left ventricular diastolic Doppler parameters are consistent with impaired  relaxation Indeterminent filling pressures The E/e' is 8-15. No evidence of left ventricular regional wall motion abnormalities.  2. The right ventricle has normal systolic function. The cavity was normal. There is no increase in right ventricular wall thickness.  3. Left atrial size was mildly dilated.  4. The mitral valve is degenerative. Mild thickening of the mitral valve leaflet. Mild calcification of the mitral valve leaflet.  5. The aortic valve was not well visualized.  6. The ascending aorta and aortic root are normal in size and structure.  7. No evidence of left ventricular regional wall motion abnormalities.  8. The interatrial septum was not well visualized.   Patient Profile     68 y.o. male with a hx of CAD status post remote PCI to the LAD and RCA, hyperlipidemia, obstructive  sleep apnea on CPAP and prostate cancer who presented to the ED on 3/7 with chief complaint of left-sided chest pain.  In the ED he met sepsis criteria and found to have positive blood cultures growing enterococcus species. Influenza and respiratory panels negative.  Also found to have elevated troponin.  He is being seen  for evaluation of chest pain and elevated troponin at the request of Dr. Marthenia Rolling, Internal Medicine.   Assessment & Plan    1. Elevated troponin in setting of sepsis and bacteremia. Troponin low level and relatively flat. Ecg normal. Echo shows normal LV function. Low suspicion for ACS. I think this is all demand ischemia. Will continue ASA, add statin and beta blocker. Consider ischemic evaluation after he recovers from infection as outpatient unless he has more angina. No angina. 2. Enterococcus bacteremia/sepsis. Appears to be a urinary source. Antibiotics per primary team. ID requests a TEE. Will be done today. 3. CAD with remote NSTEMI in 2004. S/p stenting of the RCA and LAD. Lost to follow up since 2008.  4. HLD. LDL 100. With history of CAD he should be on a statin. Thinks he may have had issues with statins before but basically hasn't been taking any cardiac medication including ASA. Will start Crestor 10 mg daily. If he develops side effects we could consider alternative lipid lowering therapy. 5. OSA on CPAP 6. History of prostate CA.  7. HTN on no therapy. Given CAD will start Coreg. BP improved. History of cough on ACEi.       For questions or updates, please contact [place] Please consult [email or web address] for contact info under        Signed, Kiesha Ensey [name], MD  03/16/2018, 8:36 AM

## 2018-03-16 NOTE — Progress Notes (Signed)
Progress Note  Patient Name: ALIJAH AKRAM Date of Encounter: 03/16/2018  Primary Cardiologist: No primary care provider on file. New- Dr [name]  Subjective   Feels much better today. No chest pain. No dyspnea.   Inpatient Medications    Scheduled Meds: . aspirin  81 mg Oral Daily  . carvedilol  12.5 mg Oral BID WC  . enoxaparin (LOVENOX) injection  40 mg Subcutaneous Q24H  . rosuvastatin  10 mg Oral q1800  . vitamin C  500 mg Oral Daily   Continuous Infusions: . sodium chloride 20 mL/hr at 03/16/18 0517  . ampicillin-sulbactam (UNASYN) IV 3 g (03/16/18 0201)   PRN Meds: acetaminophen, HYDROcodone-acetaminophen, ondansetron (ZOFRAN) IV   Vital Signs    Vitals:   03/15/18 1156 03/15/18 1624 03/15/18 2025 03/16/18 0520  BP: (!) 144/87 139/84 122/78 (!) 143/84  Pulse: 62 60 60 61  Resp: (!) 25 (!) 22 16 (!) 21  Temp: 98.6 F (37 C) 98.1 F (36.7 C) 98.3 F (36.8 C) 98.6 F (37 C)  TempSrc: Oral Oral Oral Oral  SpO2: 98% 96% 98% 95%  Weight:    106.5 kg  Height:        Intake/Output Summary (Last 24 hours) at 03/16/2018 0836 Last data filed at 03/16/2018 0517 Gross per 24 hour  Intake 1350 ml  Output 1883 ml  Net -533 ml   Last 3 Weights 03/16/2018 03/12/2018 03/12/2018  Weight (lbs) 234 lb 14.4 oz 236 lb 12.4 oz 232 lb  Weight (kg) 106.55 kg 107.4 kg 105.235 kg      Telemetry    NSR - Personally Reviewed  ECG    NSR with normal Ecg. - Personally Reviewed  Physical Exam   GEN: No acute distress.   Neck: No JVD Cardiac: RRR, no murmurs, rubs, or gallops.  Respiratory: Clear to auscultation bilaterally. GI: Soft, nontender, non-distended  MS: No edema; No deformity. Neuro:  Nonfocal  Psych: Normal affect   Labs    Chemistry Recent Labs  Lab 03/12/18 1655 03/13/18 0908 03/16/18 0251  NA 139 138 134*  K 3.6 4.0 3.2*  CL 107 106 103  CO2 21* 23 22  GLUCOSE 133* 112* 164*  BUN '15 15 11  '$ CREATININE 1.23 1.14 0.83  CALCIUM 9.3 8.6* 8.1*    PROT 5.9* 6.0*  --   ALBUMIN 3.5 3.3*  --   AST 42* 39  --   ALT 46* 43  --   ALKPHOS 60 49  --   BILITOT 1.2 1.0  --   GFRNONAA >60 >60 >60  GFRAA >60 >60 >60  ANIONGAP '11 9 9     '$ Hematology Recent Labs  Lab 03/13/18 0908 03/14/18 0741 03/15/18 0226  WBC 13.9* 7.8 8.0  RBC 4.72 4.59 4.49  HGB 13.7 13.8 13.5  HCT 41.8 39.6 38.2*  MCV 88.6 86.3 85.1  MCH 29.0 30.1 30.1  MCHC 32.8 34.8 35.3  RDW 13.1 13.2 13.1  PLT 185 172 156    Cardiac Enzymes Recent Labs  Lab 03/13/18 0354 03/13/18 0908 03/13/18 1432 03/14/18 0741  TROPONINI 0.06* 0.12* 0.29* 0.23*    Recent Labs  Lab 03/12/18 1657  TROPIPOC 0.02     BNPNo results for input(s): BNP, PROBNP in the last 168 hours.   DDimer No results for input(s): DDIMER in the last 168 hours.   Radiology    No results found.  Cardiac Studies   Echo 03/13/18: IMPRESSIONS    1. The left ventricle has  normal systolic function with an ejection fraction of 60-65%. The cavity size was normal. There is moderately increased left ventricular wall thickness. Left ventricular diastolic Doppler parameters are consistent with impaired  relaxation Indeterminent filling pressures The E/e' is 8-15. No evidence of left ventricular regional wall motion abnormalities.  2. The right ventricle has normal systolic function. The cavity was normal. There is no increase in right ventricular wall thickness.  3. Left atrial size was mildly dilated.  4. The mitral valve is degenerative. Mild thickening of the mitral valve leaflet. Mild calcification of the mitral valve leaflet.  5. The aortic valve was not well visualized.  6. The ascending aorta and aortic root are normal in size and structure.  7. No evidence of left ventricular regional wall motion abnormalities.  8. The interatrial septum was not well visualized.   Patient Profile     68 y.o. male with a hx of CAD status post remote PCI to the LAD and RCA, hyperlipidemia, obstructive  sleep apnea on CPAP and prostate cancer who presented to the ED on 3/7 with chief complaint of left-sided chest pain.  In the ED he met sepsis criteria and found to have positive blood cultures growing enterococcus species. Influenza and respiratory panels negative.  Also found to have elevated troponin.  He is being seen  for evaluation of chest pain and elevated troponin at the request of Dr. Marthenia Rolling, Internal Medicine.   Assessment & Plan    1. Elevated troponin in setting of sepsis and bacteremia. Troponin low level and relatively flat. Ecg normal. Echo shows normal LV function. Low suspicion for ACS. I think this is all demand ischemia. Will continue ASA, add statin and beta blocker. Consider ischemic evaluation after he recovers from infection as outpatient unless he has more angina. No angina. 2. Enterococcus bacteremia/sepsis. Appears to be a urinary source. Antibiotics per primary team. ID requests a TEE. Will be done today. 3. CAD with remote NSTEMI in 2004. S/p stenting of the RCA and LAD. Lost to follow up since 2008.  4. HLD. LDL 100. With history of CAD he should be on a statin. Thinks he may have had issues with statins before but basically hasn't been taking any cardiac medication including ASA. Will start Crestor 10 mg daily. If he develops side effects we could consider alternative lipid lowering therapy. 5. OSA on CPAP 6. History of prostate CA.  7. HTN on no therapy. Given CAD will start Coreg. BP improved. History of cough on ACEi.       For questions or updates, please contact [place] Please consult [email or web address] for contact info under        Signed,  [name], MD  03/16/2018, 8:36 AM

## 2018-03-16 NOTE — Progress Notes (Signed)
Patient has home CPAP and places himself on/off without assistance. RT will monitor as needed.

## 2018-03-16 NOTE — Progress Notes (Signed)
  Echocardiogram Echocardiogram Transesophageal has been performed.  Keith Murillo 03/16/2018, 11:44 AM

## 2018-03-16 NOTE — Progress Notes (Addendum)
[facility] TEAM 1 - Stepdown/ICU TEAM  LAMARI BECKLES  EXB:[id number] DOB: May 18, 1950 DOA: 03/12/2018 PCP: [name], FNP    Brief Narrative:  [phone] w/ recently diagnosed prostate CA, CADstatus post multiple stent placement, HLD, and OSAon CPAP who presented  withleft side chest painwhile doing physical labor.   After admit he was found to have a T-max of 103.1 F. Influenza A and B were negative. Respiratory viral panel was negative.  Blood and urine cultures came back positive for Enterococcus species and Staphylococcus.    Subjective: Patient seen in his room. No chest pain or shortness of breath at this time.  He denies nausea or vomiting.  He is starting to get his strength back.  He had TEE this am which did not show any vegetations.    Assessment & Plan:  Sepsis - prob cystitis, ID following. +E faecalis and Staph epi in blood and urine cx's and on BCID.  TEE today negative.  On IV Unasyn D#5 today of IV abx, high fevers perhaps are now abating, afeb x 24 hrs.  - cont Unasyn IV, will ask ID about AB choice/ duration  Prostate CA - diagnoses Fall 2019 - spoke w pt's urologist Dr Bernardo Heater in [place] about this infection, he agreed to putting patient on Flomax for now; he recommended patient f/u at his current appt date the last week of March  Chest pain - Hx of CAD s/p stents. Resolved.  Seen by Cardiology, plan outpatient cardiac eval when clinically stable. EF normal w/ no WMA. Heparin IV stopped.  Per cardiology mild troponin ^ is due to demand ischemia.  No further w/u planned.   Hyperlipidemia LDL 62   OSA Nightly CPAP   Hepatic steatosis Incidentally noted on CT abdom 03/12/18 - weight loss advised   Obesity - Body mass index is 36.65 kg/m.   DVT prophylaxis: lovenox  Code Status: FULL CODE Family Communication: pt's wife is present Disposition Plan: home 1-2 when back close to baseline  Kelly Splinter / [facility] [phone] 03/16/2018, 12:43  PM   Consultants:  ID  Cardiology   Antimicrobials:  Unasyn 3/7 >  Objective: Blood pressure 139/75, pulse (!) 58, temperature 98.2 F (36.8 C), temperature source Oral, resp. rate 18, height 5\' 7"  (1.702 m), weight 106.1 kg, SpO2 94 %.  Intake/Output Summary (Last 24 hours) at 03/16/2018 1227 Last data filed at 03/16/2018 0932 Gross per 24 hour  Intake 1600 ml  Output 1883 ml  Net -283 ml   Filed Weights   03/12/18 2312 03/16/18 0520 03/16/18 1047  Weight: 107.4 kg 106.5 kg 106.1 kg    Examination: General: No acute respiratory distress Lungs: Clear to auscultation bilaterally without wheezes or crackles Cardiovascular: Regular rate and rhythm without murmur gallop or rub normal S1 and S2 Abdomen: Nontender, nondistended, soft, bowel sounds positive, no rebound, no ascites, no appreciable mass Extremities: No significant cyanosis, clubbing, or edema bilateral lower extremities  CBC: Recent Labs  Lab 03/12/18 1830 03/13/18 0908 03/14/18 0741 03/15/18 0226  WBC 11.0* 13.9* 7.8 8.0  NEUTROABS 10.4*  --   --   --   HGB 14.9 13.7 13.8 13.5  HCT 44.6 41.8 39.6 38.2*  MCV 88.0 88.6 86.3 85.1  PLT 258 185 172 034   Basic Metabolic Panel: Recent Labs  Lab 03/12/18 1655 03/13/18 0908 03/16/18 0251  NA 139 138 134*  K 3.6 4.0 3.2*  CL 107 106 103  CO2 21* 23 22  GLUCOSE 133* 112*  164*  BUN 15 15 11   CREATININE 1.23 1.14 0.83  CALCIUM 9.3 8.6* 8.1*  MG  --  1.7  --   PHOS  --  3.6  --    GFR: Estimated Creatinine Clearance: 101.7 mL/min (by C-G formula based on SCr of 0.83 mg/dL).  Liver Function Tests: Recent Labs  Lab 03/12/18 1655 03/13/18 0908  AST 42* 39  ALT 46* 43  ALKPHOS 60 49  BILITOT 1.2 1.0  PROT 5.9* 6.0*  ALBUMIN 3.5 3.3*   Recent Labs  Lab 03/12/18 1655  LIPASE 35    Coagulation Profile: Recent Labs  Lab 03/12/18 1750  INR 1.1    Cardiac Enzymes: Recent Labs  Lab 03/12/18 2021 03/13/18 0354 03/13/18 0908 03/13/18 1432  03/14/18 0741  CKTOTAL 159  --   --   --   --   TROPONINI <0.03 0.06* 0.12* 0.29* 0.23*    HbA1C: Hgb A1c MFr Bld  Date/Time Value Ref Range Status  10/31/2016 09:21 AM 5.4 4.6 - 6.5 % Final    Comment:    Glycemic Control Guidelines for People with Diabetes:Non Diabetic:  <6%Goal of Therapy: <7%Additional Action Suggested:  >8%      Recent Results (from the past 240 hour(s))  Culture, blood (Routine x 2)     Status: Abnormal   Collection Time: 03/12/18  4:34 PM  Result Value Ref Range Status   Specimen Description BLOOD LEFT HAND  Final   Special Requests   Final    BOTTLES DRAWN AEROBIC AND ANAEROBIC Blood Culture adequate volume   Culture  Setup Time   Final    IN BOTH AEROBIC AND ANAEROBIC BOTTLES GRAM POSITIVE COCCI CRITICAL RESULT CALLED TO, READ BACK BY AND VERIFIED WITH: Ailene Rud [id number] AT 1012 BY CM Performed at [facility], [place] [address], [place], [place] [address]    Culture (A)  Final    ENTEROCOCCUS FAECALIS STAPHYLOCOCCUS EPIDERMIDIS    Report Status 03/16/2018 FINAL  Final   Organism ID, Bacteria ENTEROCOCCUS FAECALIS  Final   Organism ID, Bacteria STAPHYLOCOCCUS EPIDERMIDIS  Final      Susceptibility   Enterococcus faecalis - MIC*    AMPICILLIN <=2 SENSITIVE Sensitive     VANCOMYCIN 1 SENSITIVE Sensitive     GENTAMICIN SYNERGY SENSITIVE Sensitive     * ENTEROCOCCUS FAECALIS   Staphylococcus epidermidis - MIC*    CIPROFLOXACIN <=0.5 SENSITIVE Sensitive     ERYTHROMYCIN <=0.25 SENSITIVE Sensitive     GENTAMICIN 1 SENSITIVE Sensitive     OXACILLIN <=0.25 SENSITIVE Sensitive     TETRACYCLINE 2 SENSITIVE Sensitive     VANCOMYCIN <=0.5 SENSITIVE Sensitive     TRIMETH/SULFA 160 RESISTANT Resistant     CLINDAMYCIN <=0.25 SENSITIVE Sensitive     RIFAMPIN <=0.5 SENSITIVE Sensitive     Inducible Clindamycin NEGATIVE Sensitive     * STAPHYLOCOCCUS EPIDERMIDIS  Culture, blood (Routine x 2)     Status: Abnormal   Collection Time: 03/12/18  4:34  PM  Result Value Ref Range Status   Specimen Description BLOOD RIGHT HAND  Final   Special Requests   Final    BOTTLES DRAWN AEROBIC AND ANAEROBIC Blood Culture adequate volume   Culture  Setup Time   Final    GRAM POSITIVE COCCI IN BOTH AEROBIC AND ANAEROBIC BOTTLES CRITICAL VALUE NOTED.  VALUE IS CONSISTENT WITH PREVIOUSLY REPORTED AND CALLED VALUE.    Culture (A)  Final    ENTEROCOCCUS FAECALIS STAPHYLOCOCCUS EPIDERMIDIS SUSCEPTIBILITIES PERFORMED  ON PREVIOUS CULTURE WITHIN THE LAST 5 DAYS. FOR ENTEROCOCCUS FAECALIS Performed at [facility], [place] [address] [place], [place] [address]    Report Status 03/15/2018 FINAL  Final   Organism ID, Bacteria STAPHYLOCOCCUS EPIDERMIDIS  Final      Susceptibility   Staphylococcus epidermidis - MIC*    CIPROFLOXACIN <=0.5 SENSITIVE Sensitive     ERYTHROMYCIN <=0.25 SENSITIVE Sensitive     GENTAMICIN <=0.5 SENSITIVE Sensitive     OXACILLIN <=0.25 SENSITIVE Sensitive     TETRACYCLINE <=1 SENSITIVE Sensitive     VANCOMYCIN <=0.5 SENSITIVE Sensitive     TRIMETH/SULFA 160 RESISTANT Resistant     CLINDAMYCIN <=0.25 SENSITIVE Sensitive     RIFAMPIN <=0.5 SENSITIVE Sensitive     Inducible Clindamycin NEGATIVE Sensitive     * STAPHYLOCOCCUS EPIDERMIDIS  Blood Culture ID Panel (Reflexed)     Status: Abnormal   Collection Time: 03/12/18  4:34 PM  Result Value Ref Range Status   Enterococcus species DETECTED (A) NOT DETECTED Final    Comment: CRITICAL RESULT CALLED TO, READ BACK BY AND VERIFIED WITH: PHARMD M MACCIA [id number] AT 1012 BY CM    Vancomycin resistance NOT DETECTED NOT DETECTED Final   Listeria monocytogenes NOT DETECTED NOT DETECTED Final   Staphylococcus species DETECTED (A) NOT DETECTED Final    Comment: Methicillin (oxacillin) susceptible coagulase negative staphylococcus. Possible blood culture contaminant (unless isolated from more than one blood culture draw or clinical case suggests pathogenicity). No antibiotic treatment is  indicated for blood  culture contaminants. CRITICAL RESULT CALLED TO, READ BACK BY AND VERIFIED WITH: PHARMD M [place] [id number] AT 1012 BY CM    Staphylococcus aureus (BCID) NOT DETECTED NOT DETECTED Final   Methicillin resistance NOT DETECTED NOT DETECTED Final   Streptococcus species NOT DETECTED NOT DETECTED Final   Streptococcus agalactiae NOT DETECTED NOT DETECTED Final   Streptococcus pneumoniae NOT DETECTED NOT DETECTED Final   Streptococcus pyogenes NOT DETECTED NOT DETECTED Final   Acinetobacter baumannii NOT DETECTED NOT DETECTED Final   Enterobacteriaceae species NOT DETECTED NOT DETECTED Final   Enterobacter cloacae complex NOT DETECTED NOT DETECTED Final   Escherichia coli NOT DETECTED NOT DETECTED Final   Klebsiella oxytoca NOT DETECTED NOT DETECTED Final   Klebsiella pneumoniae NOT DETECTED NOT DETECTED Final   Proteus species NOT DETECTED NOT DETECTED Final   Serratia marcescens NOT DETECTED NOT DETECTED Final   Haemophilus influenzae NOT DETECTED NOT DETECTED Final   Neisseria meningitidis NOT DETECTED NOT DETECTED Final   Pseudomonas aeruginosa NOT DETECTED NOT DETECTED Final   Candida albicans NOT DETECTED NOT DETECTED Final   Candida glabrata NOT DETECTED NOT DETECTED Final   Candida krusei NOT DETECTED NOT DETECTED Final   Candida parapsilosis NOT DETECTED NOT DETECTED Final   Candida tropicalis NOT DETECTED NOT DETECTED Final    Comment: Performed at [facility], [address], [place], [place] [address]  Respiratory Panel by PCR     Status: None   Collection Time: 03/12/18  5:33 PM  Result Value Ref Range Status   Adenovirus NOT DETECTED NOT DETECTED Final   Coronavirus 229E NOT DETECTED NOT DETECTED Final    Comment: (NOTE) The Coronavirus on the Respiratory Panel, DOES NOT test for the novel  Coronavirus (2019 nCoV)    Coronavirus HKU1 NOT DETECTED NOT DETECTED Final   Coronavirus NL63 NOT DETECTED NOT DETECTED Final   Coronavirus OC43 NOT DETECTED  NOT DETECTED Final   Metapneumovirus NOT DETECTED NOT DETECTED Final  Rhinovirus / Enterovirus NOT DETECTED NOT DETECTED Final   Influenza A NOT DETECTED NOT DETECTED Final   Influenza B NOT DETECTED NOT DETECTED Final   Parainfluenza Virus 1 NOT DETECTED NOT DETECTED Final   Parainfluenza Virus 2 NOT DETECTED NOT DETECTED Final   Parainfluenza Virus 3 NOT DETECTED NOT DETECTED Final   Parainfluenza Virus 4 NOT DETECTED NOT DETECTED Final   Respiratory Syncytial Virus NOT DETECTED NOT DETECTED Final   Bordetella pertussis NOT DETECTED NOT DETECTED Final   Chlamydophila pneumoniae NOT DETECTED NOT DETECTED Final   Mycoplasma pneumoniae NOT DETECTED NOT DETECTED Final    Comment: Performed at [facility], [place] [address] [place], [place] [address]  Urine culture     Status: Abnormal   Collection Time: 03/12/18 10:14 PM  Result Value Ref Range Status   Specimen Description URINE, RANDOM  Final   Special Requests   Final    NONE Performed at [facility], [place] [address], [place], [place] [address]    Culture (A)  Final    >=100,000 COLONIES/mL ENTEROCOCCUS FAECALIS 80,000 COLONIES/mL STAPHYLOCOCCUS EPIDERMIDIS    Report Status 03/15/2018 FINAL  Final   Organism ID, Bacteria ENTEROCOCCUS FAECALIS (A)  Final   Organism ID, Bacteria STAPHYLOCOCCUS EPIDERMIDIS (A)  Final      Susceptibility   Enterococcus faecalis - MIC*    AMPICILLIN <=2 SENSITIVE Sensitive     LEVOFLOXACIN 0.5 SENSITIVE Sensitive     NITROFURANTOIN <=16 SENSITIVE Sensitive     VANCOMYCIN 1 SENSITIVE Sensitive     * >=100,000 COLONIES/mL ENTEROCOCCUS FAECALIS   Staphylococcus epidermidis - MIC*    CIPROFLOXACIN <=0.5 SENSITIVE Sensitive     GENTAMICIN <=0.5 SENSITIVE Sensitive     NITROFURANTOIN <=16 SENSITIVE Sensitive     OXACILLIN <=0.25 SENSITIVE Sensitive     TETRACYCLINE <=1 SENSITIVE Sensitive     VANCOMYCIN <=0.5 SENSITIVE Sensitive     TRIMETH/SULFA 40 SENSITIVE Sensitive     CLINDAMYCIN  <=0.25 SENSITIVE Sensitive     RIFAMPIN <=0.5 SENSITIVE Sensitive     Inducible Clindamycin NEGATIVE Sensitive     * 80,000 COLONIES/mL STAPHYLOCOCCUS EPIDERMIDIS  Culture, blood (routine x 2)     Status: None (Preliminary result)   Collection Time: 03/14/18  7:41 AM  Result Value Ref Range Status   Specimen Description BLOOD RIGHT ANTECUBITAL  Final   Special Requests   Final    BOTTLES DRAWN AEROBIC AND ANAEROBIC Blood Culture adequate volume   Culture   Final    NO GROWTH 2 DAYS Performed at [facility], [address] [place], [place] [address]    Report Status PENDING  Incomplete  Culture, blood (routine x 2)     Status: None (Preliminary result)   Collection Time: 03/14/18  7:49 AM  Result Value Ref Range Status   Specimen Description BLOOD LEFT HAND  Final   Special Requests   Final    BOTTLES DRAWN AEROBIC AND ANAEROBIC Blood Culture adequate volume   Culture   Final    NO GROWTH 2 DAYS Performed at [facility], [place] [address] [place], [place] [address]    Report Status PENDING  Incomplete     Scheduled Meds: . aspirin  81 mg Oral Daily  . carvedilol  12.5 mg Oral BID WC  . enoxaparin (LOVENOX) injection  40 mg Subcutaneous Q24H  . rosuvastatin  10 mg Oral q1800  . vitamin C  500 mg Oral Daily   Continuous  Infusions: . ampicillin-sulbactam (UNASYN) IV 3 g (03/16/18 0853)     LOS: 3 days   Cherene Altes, MD [facility] Office  [phone] Pager - Text Page per Amion  If 7PM-7AM, please contact night-coverage per Amion 03/16/2018, 12:27 PM

## 2018-03-16 NOTE — Interval H&P Note (Signed)
History and Physical Interval Note:  03/16/2018 10:35 AM  Keith Murillo  has presented today for surgery, with the diagnosis of BACTEREMIA.  The various methods of treatment have been discussed with the patient and family. After consideration of risks, benefits and other options for treatment, the patient has consented to  Procedure(s): TRANSESOPHAGEAL ECHOCARDIOGRAM (TEE) (N/A) as a surgical intervention.  The patient's history has been reviewed, patient examined, no change in status, stable for surgery.  I have reviewed the patient's chart and labs.  Questions were answered to the patient's satisfaction.     Mertie Moores

## 2018-03-17 ENCOUNTER — Other Ambulatory Visit: Payer: Medicare Other

## 2018-03-17 ENCOUNTER — Encounter (HOSPITAL_COMMUNITY): Payer: Self-pay | Admitting: Cardiovascular Disease

## 2018-03-17 LAB — CBC
HCT: 42.2 % (ref 39.0–52.0)
HEMOGLOBIN: 14.3 g/dL (ref 13.0–17.0)
MCH: 28.9 pg (ref 26.0–34.0)
MCHC: 33.9 g/dL (ref 30.0–36.0)
MCV: 85.4 fL (ref 80.0–100.0)
Platelets: 233 10*3/uL (ref 150–400)
RBC: 4.94 MIL/uL (ref 4.22–5.81)
RDW: 12.9 % (ref 11.5–15.5)
WBC: 7.7 10*3/uL (ref 4.0–10.5)
nRBC: 0 % (ref 0.0–0.2)

## 2018-03-17 LAB — BASIC METABOLIC PANEL
Anion gap: 10 (ref 5–15)
BUN: 13 mg/dL (ref 8–23)
CO2: 23 mmol/L (ref 22–32)
Calcium: 8.7 mg/dL — ABNORMAL LOW (ref 8.9–10.3)
Chloride: 105 mmol/L (ref 98–111)
Creatinine, Ser: 0.95 mg/dL (ref 0.61–1.24)
GFR calc non Af Amer: >60 mL/min (ref >60)
Glucose, Bld: 140 mg/dL — ABNORMAL HIGH (ref 70–99)
Potassium: 3.9 mmol/L (ref 3.5–5.1)
Sodium: 138 mmol/L (ref 135–145)

## 2018-03-17 MED ORDER — ASPIRIN 81 MG PO CHEW: 81.0000 mg | CHEWABLE_TABLET | Freq: Every day | ORAL | 0 refills | Status: DC

## 2018-03-17 MED ORDER — CARVEDILOL 12.5 MG PO TABS: 12.5000 mg | ORAL_TABLET | Freq: Two times a day (BID) | ORAL | 0 refills | Status: DC

## 2018-03-17 MED ORDER — ROSUVASTATIN CALCIUM 10 MG PO TABS: 10.0000 mg | ORAL_TABLET | Freq: Every day | ORAL | 0 refills | Status: DC

## 2018-03-17 MED ORDER — AMOXICILLIN-POT CLAVULANATE 875-125 MG PO TABS: 1.0000 | ORAL_TABLET | Freq: Two times a day (BID) | ORAL | 0 refills | Status: AC

## 2018-03-17 NOTE — Discharge Summary (Signed)
Physician Discharge Summary  Keith Murillo KYH:[id number] DOB: 05-31-1950 DOA: 03/12/2018  PCP: [name], FNP  Admit date: 03/12/2018 Discharge date: 03/17/2018  Admitted From: Home Disposition:  Home  Recommendations for Outpatient Follow-up:  1. Follow up with PCP in 1 week 2. Follow-up with [name], cardiology in 3 to 4 weeks 3. Follow up with Urology Dr Bernardo Heater at the end of the month as scheduled  4. Follow up final blood cultures from 3/8, negative at time of discharge   Discharge Condition: Stable CODE STATUS: Full  Diet recommendation: Heart healthy   Brief/Interim Summary: Keith Murillo is a [phone] w/ recently diagnosed prostate CA, CADstatus post multiple stent placement, HLD, and OSAon CPAP who presented withleft side chest painwhile doing physical labor. After admission, he was found to have a T-max of 103.1 F. Influenza A and B were negative. Respiratory viral panel was negative. Blood and urine cultures came back positive for Enterococcus species and Staphylococcus.   Discharge Diagnoses:   Sepsis secondary to enterococcus faecalis and staph epidermidis bacteremia, thought to be secondary to urinary source, present on admission -TEE negative -Repeat blood cultures 3/8 negative to date -Appreciate infectious disease -Treated with Unasyn, transition to Augmentin for 7 days on discharge  Prostate cancer - diagnoses Fall 2019 -Dr. Melvia Heaps spoke w pt's urologist Dr Bernardo Heater in [place] about this infection, he agreed to putting patient on Flomax for now; he recommended patient f/u at his current appt date the last week of March  Chest pain - Hx of CAD s/p stents. Resolved.  -Seen by Cardiology, plan outpatient cardiac eval when clinically stable. EF normal w/ no WMA. Heparin IV stopped.  Per cardiology mild troponin increase is due to demand ischemia.  No further w/u planned.  Continue Coreg, aspirin, statin  Hyperlipidemia -Continue  statin  OSA -Nightly CPAP   Hepatic steatosis -Incidentally noted on CT abdom 03/12/18 - weight loss advised   Obesity  -Body mass index is 36.65 kg/m.   Discharge Instructions  Discharge Instructions    Call MD for:  extreme fatigue   Complete by:  As directed    Call MD for:  hives   Complete by:  As directed    Call MD for:  severe uncontrolled pain   Complete by:  As directed    Call MD for:  temperature >100.4   Complete by:  As directed    Diet - low sodium heart healthy   Complete by:  As directed    Discharge instructions   Complete by:  As directed    You were cared for by a hospitalist during your hospital stay. If you have any questions about your discharge medications or the care you received while you were in the hospital after you are discharged, you can call the unit and ask to speak with the hospitalist on call if the hospitalist that took care of you is not available. Once you are discharged, your primary care physician will handle any further medical issues. Please note that NO REFILLS for any discharge medications will be authorized once you are discharged, as it is imperative that you return to your primary care physician (or establish a relationship with a primary care physician if you do not have one) for your aftercare needs so that they can reassess your need for medications and monitor your lab values.   Increase activity slowly   Complete by:  As directed      Allergies as of 03/17/2018  Reactions   Lisinopril Cough      Medication List    STOP taking these medications   aspirin 325 MG tablet Replaced by:  aspirin 81 MG chewable tablet     TAKE these medications   amoxicillin-clavulanate 875-125 MG tablet Commonly known as:  Augmentin Take 1 tablet by mouth 2 (two) times daily for 6 days.   aspirin 81 MG chewable tablet Chew 1 tablet (81 mg total) by mouth daily. Start taking on:  March 18, 2018 Replaces:  aspirin 325 MG tablet    carvedilol 12.5 MG tablet Commonly known as:  COREG Take 1 tablet (12.5 mg total) by mouth 2 (two) times daily with a meal.   CRANBERRY PO Take 1 capsule by mouth daily.   DHEA 25 MG Caps Take 25 mg by mouth daily.   OMEGA 3 PO Take 1 capsule by mouth daily.   OVER THE COUNTER MEDICATION Take 1,300 mg by mouth See admin instructions. [place] grape capsules - 650 mg each - take 2 capsules by mouth daily   OVER THE COUNTER MEDICATION Take 1 capsule by mouth daily. EDTA   rosuvastatin 10 MG tablet Commonly known as:  CRESTOR Take 1 tablet (10 mg total) by mouth daily at 6 PM.   sildenafil 20 MG tablet Commonly known as:  REVATIO 2-5 tabs 1 hour prior to intercourse What changed:    how much to take  how to take this  when to take this  reasons to take this  additional instructions   TURMERIC PO Take 1 capsule by mouth daily.   VITAMIN B-12 PO Take 1 tablet by mouth daily.   vitamin C 500 MG tablet Commonly known as:  ASCORBIC ACID Take 500 mg by mouth daily.   Vitamin D-3 125 MCG (5000 UT) Tabs Take 10,000 Units by mouth daily.      Follow-up Information    [name], FNP. Schedule an appointment as soon as possible for a visit in 1 week(s).   Specialties:  Nurse Practitioner, Family Medicine Contact information: [place] [address] [phone]        Dorothy Spark, MD. Schedule an appointment as soon as possible for a visit in 3 week(s).   Specialty:  Cardiology Contact information: [place] [address] [phone]        Abbie Sons, MD Follow up.   Specialty:  Urology Contact information: [place] [address] [phone]          Allergies  Allergen Reactions  . Lisinopril Cough    Consultations:  Cardiology  Infectious disease   Procedures/Studies: Ct Abdomen Pelvis W Contrast  Result Date: 03/12/2018 CLINICAL  DATA:  Left-sided chest pain.  Fever and vomiting. EXAM: CT ABDOMEN AND PELVIS WITH CONTRAST TECHNIQUE: Multidetector CT imaging of the abdomen and pelvis was performed using the standard protocol following bolus administration of intravenous contrast. CONTRAST:  194mL OMNIPAQUE IOHEXOL 300 MG/ML  SOLN COMPARISON:  None. FINDINGS: Lower chest: Top normal heart size without pericardial effusion. At least three-vessel coronary arteriosclerosis is noted. Dependent bibasilar atelectasis is seen. Hepatobiliary: Steatosis of the liver. Gallbladder is unremarkable and free of stones. No biliary dilatation. No hepatic mass is identified. Pancreas: Normal Spleen: Normal Adrenals/Urinary Tract: Normal bilateral adrenal glands, kidneys and ureters without evidence of obstructive uropathy. Asymmetric eccentric thickening of the anterior right lateral wall of the urinary bladder in part due to infolding of  the bladder. Focal cystitis or a subtle mucosal bladder lesion is not entirely excluded. Consider direct visual correlation. Focal inflammatory change or mucosal lesion is not entirely excluded. Stomach/Bowel: Small hiatal hernia. Decompressed stomach. Normal small bowel rotation. Normal appearing appendix. Average stool retention within the colon without bowel obstruction or inflammation. Vascular/Lymphatic: Nonaneurysmal atherosclerotic aorta. No lymphadenopathy. Reproductive: On the large prostate with central zone calcifications. Other: Fat containing inguinal hernia.  No free air nor free fluid. Musculoskeletal: Degenerative disc disease of the lower lumbar spine. Multilevel mild facet arthropathy and hypertrophy. IMPRESSION: 1. Hepatic steatosis. 2. Focal right-sided anterior bladder thickening in part due to infolding of the bladder. A mucosal lesion or focal cystitis are among differential possibilities. Consider direct visual correlation as deemed clinically warranted or repeat imaging with better bladder distension  and opacification of the urine. 3. Coronary arteriosclerosis. 4. Fat containing right inguinal hernia. Electronically Signed   By: [name] M.D.   On: 03/12/2018 19:23   Dg Chest Portable 1 View  Result Date: 03/12/2018 CLINICAL DATA:  LEFT chest pain, shortness of breath and vomiting today, blood in urine, body aches EXAM: PORTABLE CHEST 1 VIEW COMPARISON:  Portable exam 1616 hours without priors for comparison FINDINGS: Upper normal heart size. Mediastinal contours and pulmonary vascularity normal. Lungs clear. No infiltrate, pleural effusion or pneumothorax. Bones unremarkable. IMPRESSION: No acute abnormalities. Electronically Signed   By: Lavonia Dana M.D.   On: 03/12/2018 16:48   Echocardiogram 03/13/2018 IMPRESSIONS 1. The left ventricle has normal systolic function with an ejection fraction of 60-65%. The cavity size was normal. There is moderately increased left ventricular wall thickness. Left ventricular diastolic Doppler parameters are consistent with impaired  relaxation Indeterminent filling pressures The E/e' is 8-15. No evidence of left ventricular regional wall motion abnormalities.  2. The right ventricle has normal systolic function. The cavity was normal. There is no increase in right ventricular wall thickness.  3. Left atrial size was mildly dilated.  4. The mitral valve is degenerative. Mild thickening of the mitral valve leaflet. Mild calcification of the mitral valve leaflet.  5. The aortic valve was not well visualized.  6. The ascending aorta and aortic root are normal in size and structure.  7. No evidence of left ventricular regional wall motion abnormalities.  8. The interatrial septum was not well visualized.  TEE 03/16/2018 IMPRESSIONS  1. The left ventricle has normal systolic function with an ejection fraction of 60-65%. The cavity size was normal. Left ventricular diastolic function could not be evaluated.  2. The right ventricle has normal systolic function. The  cavity was normal. There is no increase in right ventricular wall thickness.  3. Left atrial appendage : No evidence of thrombi.  4. Mitral valve regurgitation is mild to moderate by color flow Doppler. No mitral valve vegetation visualized.  5. The aortic valve is tricuspid.   Discharge Exam: Vitals:   03/17/18 0401 03/17/18 0811  BP:  (!) 144/86  Pulse:  61  Resp: 19   Temp:    SpO2:      General: Pt is alert, awake, not in acute distress Cardiovascular: RRR, S1/S2 +, no rubs, no gallops Respiratory: CTA bilaterally, no wheezing, no rhonchi Abdominal: Soft, NT, ND, bowel sounds + Extremities: no edema, no cyanosis    The results of significant diagnostics from this hospitalization (including imaging, microbiology, ancillary and laboratory) are listed below for reference.     Microbiology: Recent Results (from the past 240 hour(s))  Culture, blood (Routine  x 2)     Status: Abnormal   Collection Time: 03/12/18  4:34 PM  Result Value Ref Range Status   Specimen Description BLOOD LEFT HAND  Final   Special Requests   Final    BOTTLES DRAWN AEROBIC AND ANAEROBIC Blood Culture adequate volume   Culture  Setup Time   Final    IN BOTH AEROBIC AND ANAEROBIC BOTTLES GRAM POSITIVE COCCI CRITICAL RESULT CALLED TO, READ BACK BY AND VERIFIED WITH: Ailene Rud [id number] AT 31 BY CM Performed at [facility], [place] [address], [place], [place] [address]    Culture (A)  Final    ENTEROCOCCUS FAECALIS STAPHYLOCOCCUS EPIDERMIDIS    Report Status 03/16/2018 FINAL  Final   Organism ID, Bacteria ENTEROCOCCUS FAECALIS  Final   Organism ID, Bacteria STAPHYLOCOCCUS EPIDERMIDIS  Final      Susceptibility   Enterococcus faecalis - MIC*    AMPICILLIN <=2 SENSITIVE Sensitive     VANCOMYCIN 1 SENSITIVE Sensitive     GENTAMICIN SYNERGY SENSITIVE Sensitive     * ENTEROCOCCUS FAECALIS   Staphylococcus epidermidis - MIC*    CIPROFLOXACIN <=0.5 SENSITIVE Sensitive     ERYTHROMYCIN  <=0.25 SENSITIVE Sensitive     GENTAMICIN 1 SENSITIVE Sensitive     OXACILLIN <=0.25 SENSITIVE Sensitive     TETRACYCLINE 2 SENSITIVE Sensitive     VANCOMYCIN <=0.5 SENSITIVE Sensitive     TRIMETH/SULFA 160 RESISTANT Resistant     CLINDAMYCIN <=0.25 SENSITIVE Sensitive     RIFAMPIN <=0.5 SENSITIVE Sensitive     Inducible Clindamycin NEGATIVE Sensitive     * STAPHYLOCOCCUS EPIDERMIDIS  Culture, blood (Routine x 2)     Status: Abnormal   Collection Time: 03/12/18  4:34 PM  Result Value Ref Range Status   Specimen Description BLOOD RIGHT HAND  Final   Special Requests   Final    BOTTLES DRAWN AEROBIC AND ANAEROBIC Blood Culture adequate volume   Culture  Setup Time   Final    GRAM POSITIVE COCCI IN BOTH AEROBIC AND ANAEROBIC BOTTLES CRITICAL VALUE NOTED.  VALUE IS CONSISTENT WITH PREVIOUSLY REPORTED AND CALLED VALUE.    Culture (A)  Final    ENTEROCOCCUS FAECALIS STAPHYLOCOCCUS EPIDERMIDIS SUSCEPTIBILITIES PERFORMED ON PREVIOUS CULTURE WITHIN THE LAST 5 DAYS. FOR ENTEROCOCCUS FAECALIS Performed at [facility], [place] [address], [place], [place] [address]    Report Status 03/15/2018 FINAL  Final   Organism ID, Bacteria STAPHYLOCOCCUS EPIDERMIDIS  Final      Susceptibility   Staphylococcus epidermidis - MIC*    CIPROFLOXACIN <=0.5 SENSITIVE Sensitive     ERYTHROMYCIN <=0.25 SENSITIVE Sensitive     GENTAMICIN <=0.5 SENSITIVE Sensitive     OXACILLIN <=0.25 SENSITIVE Sensitive     TETRACYCLINE <=1 SENSITIVE Sensitive     VANCOMYCIN <=0.5 SENSITIVE Sensitive     TRIMETH/SULFA 160 RESISTANT Resistant     CLINDAMYCIN <=0.25 SENSITIVE Sensitive     RIFAMPIN <=0.5 SENSITIVE Sensitive     Inducible Clindamycin NEGATIVE Sensitive     * STAPHYLOCOCCUS EPIDERMIDIS  Blood Culture ID Panel (Reflexed)     Status: Abnormal   Collection Time: 03/12/18  4:34 PM  Result Value Ref Range Status   Enterococcus species DETECTED (A) NOT DETECTED Final    Comment: CRITICAL RESULT CALLED TO, READ  BACK BY AND VERIFIED WITH: PHARMD M MACCIA [id number] AT 1012 BY CM    Vancomycin resistance NOT DETECTED NOT DETECTED Final   Listeria monocytogenes NOT DETECTED NOT DETECTED Final  Staphylococcus species DETECTED (A) NOT DETECTED Final    Comment: Methicillin (oxacillin) susceptible coagulase negative staphylococcus. Possible blood culture contaminant (unless isolated from more than one blood culture draw or clinical case suggests pathogenicity). No antibiotic treatment is indicated for blood  culture contaminants. CRITICAL RESULT CALLED TO, READ BACK BY AND VERIFIED WITH: PHARMD M [place] [id number] AT 1012 BY CM    Staphylococcus aureus (BCID) NOT DETECTED NOT DETECTED Final   Methicillin resistance NOT DETECTED NOT DETECTED Final   Streptococcus species NOT DETECTED NOT DETECTED Final   Streptococcus agalactiae NOT DETECTED NOT DETECTED Final   Streptococcus pneumoniae NOT DETECTED NOT DETECTED Final   Streptococcus pyogenes NOT DETECTED NOT DETECTED Final   Acinetobacter baumannii NOT DETECTED NOT DETECTED Final   Enterobacteriaceae species NOT DETECTED NOT DETECTED Final   Enterobacter cloacae complex NOT DETECTED NOT DETECTED Final   Escherichia coli NOT DETECTED NOT DETECTED Final   Klebsiella oxytoca NOT DETECTED NOT DETECTED Final   Klebsiella pneumoniae NOT DETECTED NOT DETECTED Final   Proteus species NOT DETECTED NOT DETECTED Final   Serratia marcescens NOT DETECTED NOT DETECTED Final   Haemophilus influenzae NOT DETECTED NOT DETECTED Final   Neisseria meningitidis NOT DETECTED NOT DETECTED Final   Pseudomonas aeruginosa NOT DETECTED NOT DETECTED Final   Candida albicans NOT DETECTED NOT DETECTED Final   Candida glabrata NOT DETECTED NOT DETECTED Final   Candida krusei NOT DETECTED NOT DETECTED Final   Candida parapsilosis NOT DETECTED NOT DETECTED Final   Candida tropicalis NOT DETECTED NOT DETECTED Final    Comment: Performed at [facility], [address],  [place], [place] [address]  Respiratory Panel by PCR     Status: None   Collection Time: 03/12/18  5:33 PM  Result Value Ref Range Status   Adenovirus NOT DETECTED NOT DETECTED Final   Coronavirus 229E NOT DETECTED NOT DETECTED Final    Comment: (NOTE) The Coronavirus on the Respiratory Panel, DOES NOT test for the novel  Coronavirus (2019 nCoV)    Coronavirus HKU1 NOT DETECTED NOT DETECTED Final   Coronavirus NL63 NOT DETECTED NOT DETECTED Final   Coronavirus OC43 NOT DETECTED NOT DETECTED Final   Metapneumovirus NOT DETECTED NOT DETECTED Final   Rhinovirus / Enterovirus NOT DETECTED NOT DETECTED Final   Influenza A NOT DETECTED NOT DETECTED Final   Influenza B NOT DETECTED NOT DETECTED Final   Parainfluenza Virus 1 NOT DETECTED NOT DETECTED Final   Parainfluenza Virus 2 NOT DETECTED NOT DETECTED Final   Parainfluenza Virus 3 NOT DETECTED NOT DETECTED Final   Parainfluenza Virus 4 NOT DETECTED NOT DETECTED Final   Respiratory Syncytial Virus NOT DETECTED NOT DETECTED Final   Bordetella pertussis NOT DETECTED NOT DETECTED Final   Chlamydophila pneumoniae NOT DETECTED NOT DETECTED Final   Mycoplasma pneumoniae NOT DETECTED NOT DETECTED Final    Comment: Performed at [facility], [place]. [address], [place], [place] [address]  Urine culture     Status: Abnormal   Collection Time: 03/12/18 10:14 PM  Result Value Ref Range Status   Specimen Description URINE, RANDOM  Final   Special Requests   Final    NONE Performed at [facility], [place] [address] [place], [place] [address]    Culture (A)  Final    >=100,000 COLONIES/mL ENTEROCOCCUS FAECALIS 80,000 COLONIES/mL STAPHYLOCOCCUS EPIDERMIDIS    Report Status 03/15/2018 FINAL  Final   Organism ID, Bacteria ENTEROCOCCUS FAECALIS (A)  Final   Organism ID, Bacteria STAPHYLOCOCCUS  EPIDERMIDIS (A)  Final      Susceptibility   Enterococcus faecalis - MIC*    AMPICILLIN <=2 SENSITIVE Sensitive     LEVOFLOXACIN 0.5 SENSITIVE Sensitive      NITROFURANTOIN <=16 SENSITIVE Sensitive     VANCOMYCIN 1 SENSITIVE Sensitive     * >=100,000 COLONIES/mL ENTEROCOCCUS FAECALIS   Staphylococcus epidermidis - MIC*    CIPROFLOXACIN <=0.5 SENSITIVE Sensitive     GENTAMICIN <=0.5 SENSITIVE Sensitive     NITROFURANTOIN <=16 SENSITIVE Sensitive     OXACILLIN <=0.25 SENSITIVE Sensitive     TETRACYCLINE <=1 SENSITIVE Sensitive     VANCOMYCIN <=0.5 SENSITIVE Sensitive     TRIMETH/SULFA 40 SENSITIVE Sensitive     CLINDAMYCIN <=0.25 SENSITIVE Sensitive     RIFAMPIN <=0.5 SENSITIVE Sensitive     Inducible Clindamycin NEGATIVE Sensitive     * 80,000 COLONIES/mL STAPHYLOCOCCUS EPIDERMIDIS  Culture, blood (routine x 2)     Status: None (Preliminary result)   Collection Time: 03/14/18  7:41 AM  Result Value Ref Range Status   Specimen Description BLOOD RIGHT ANTECUBITAL  Final   Special Requests   Final    BOTTLES DRAWN AEROBIC AND ANAEROBIC Blood Culture adequate volume   Culture   Final    NO GROWTH 3 DAYS Performed at [facility], [address] [place], [place] [address]    Report Status PENDING  Incomplete  Culture, blood (routine x 2)     Status: None (Preliminary result)   Collection Time: 03/14/18  7:49 AM  Result Value Ref Range Status   Specimen Description BLOOD LEFT HAND  Final   Special Requests   Final    BOTTLES DRAWN AEROBIC AND ANAEROBIC Blood Culture adequate volume   Culture   Final    NO GROWTH 3 DAYS Performed at [facility], [place] [address], [place], [place] [address]    Report Status PENDING  Incomplete     Labs: BNP (last 3 results) No results for input(s): BNP in the last 8760 hours. Basic Metabolic Panel: Recent Labs  Lab 03/12/18 1655 03/13/18 0908 03/16/18 0251 03/17/18 0347  NA 139 138 134* 138  K 3.6 4.0 3.2* 3.9  CL 107 106 103 105  CO2 21* 23 22 23   GLUCOSE 133* 112* 164* 140*  BUN 15 15 11 13   CREATININE 1.23 1.14 0.83 0.95  CALCIUM 9.3 8.6* 8.1* 8.7*  MG  --  1.7  --   --    PHOS  --  3.6  --   --    Liver Function Tests: Recent Labs  Lab 03/12/18 1655 03/13/18 0908  AST 42* 39  ALT 46* 43  ALKPHOS 60 49  BILITOT 1.2 1.0  PROT 5.9* 6.0*  ALBUMIN 3.5 3.3*   Recent Labs  Lab 03/12/18 1655  LIPASE 35   No results for input(s): AMMONIA in the last 168 hours. CBC: Recent Labs  Lab 03/12/18 1830 03/13/18 0908 03/14/18 0741 03/15/18 0226 03/17/18 0347  WBC 11.0* 13.9* 7.8 8.0 7.7  NEUTROABS 10.4*  --   --   --   --   HGB 14.9 13.7 13.8 13.5 14.3  HCT 44.6 41.8 39.6 38.2* 42.2  MCV 88.0 88.6 86.3 85.1 85.4  PLT 258 185 172 156 233   Cardiac Enzymes: Recent Labs  Lab 03/12/18 2021 03/13/18 0354 03/13/18 0908 03/13/18 1432 03/14/18 0741  CKTOTAL 159  --   --   --   --   TROPONINI <0.03 0.06*  0.12* 0.29* 0.23*   BNP: Invalid input(s): POCBNP CBG: Recent Labs  Lab 03/15/18 1623  GLUCAP 148*   D-Dimer No results for input(s): DDIMER in the last 72 hours. Hgb A1c No results for input(s): HGBA1C in the last 72 hours. Lipid Profile Recent Labs    03/15/18 0226  CHOL 142  HDL 33*  LDLCALC 62  TRIG 233*  CHOLHDL 4.3   Thyroid function studies No results for input(s): TSH, T4TOTAL, T3FREE, THYROIDAB in the last 72 hours.  Invalid input(s): FREET3 Anemia work up No results for input(s): VITAMINB12, FOLATE, FERRITIN, TIBC, IRON, RETICCTPCT in the last 72 hours. Urinalysis    Component Value Date/Time   COLORURINE YELLOW 03/12/2018 1656   APPEARANCEUR HAZY (A) 03/12/2018 1656   LABSPEC 1.010 03/12/2018 1656   PHURINE 6.0 03/12/2018 1656   GLUCOSEU NEGATIVE 03/12/2018 1656   HGBUR LARGE (A) 03/12/2018 1656   BILIRUBINUR NEGATIVE 03/12/2018 1656   KETONESUR NEGATIVE 03/12/2018 1656   PROTEINUR 30 (A) 03/12/2018 1656   NITRITE NEGATIVE 03/12/2018 1656   LEUKOCYTESUR TRACE (A) 03/12/2018 1656   Sepsis Labs Invalid input(s): PROCALCITONIN,  WBC,  LACTICIDVEN Microbiology Recent Results (from the past 240 hour(s))   Culture, blood (Routine x 2)     Status: Abnormal   Collection Time: 03/12/18  4:34 PM  Result Value Ref Range Status   Specimen Description BLOOD LEFT HAND  Final   Special Requests   Final    BOTTLES DRAWN AEROBIC AND ANAEROBIC Blood Culture adequate volume   Culture  Setup Time   Final    IN BOTH AEROBIC AND ANAEROBIC BOTTLES GRAM POSITIVE COCCI CRITICAL RESULT CALLED TO, READ BACK BY AND VERIFIED WITH: Ellin Mayhew [place] [id number] AT 1012 BY CM Performed at [facility], [place] [address], [place], [place] [address]    Culture (A)  Final    ENTEROCOCCUS FAECALIS STAPHYLOCOCCUS EPIDERMIDIS    Report Status 03/16/2018 FINAL  Final   Organism ID, Bacteria ENTEROCOCCUS FAECALIS  Final   Organism ID, Bacteria STAPHYLOCOCCUS EPIDERMIDIS  Final      Susceptibility   Enterococcus faecalis - MIC*    AMPICILLIN <=2 SENSITIVE Sensitive     VANCOMYCIN 1 SENSITIVE Sensitive     GENTAMICIN SYNERGY SENSITIVE Sensitive     * ENTEROCOCCUS FAECALIS   Staphylococcus epidermidis - MIC*    CIPROFLOXACIN <=0.5 SENSITIVE Sensitive     ERYTHROMYCIN <=0.25 SENSITIVE Sensitive     GENTAMICIN 1 SENSITIVE Sensitive     OXACILLIN <=0.25 SENSITIVE Sensitive     TETRACYCLINE 2 SENSITIVE Sensitive     VANCOMYCIN <=0.5 SENSITIVE Sensitive     TRIMETH/SULFA 160 RESISTANT Resistant     CLINDAMYCIN <=0.25 SENSITIVE Sensitive     RIFAMPIN <=0.5 SENSITIVE Sensitive     Inducible Clindamycin NEGATIVE Sensitive     * STAPHYLOCOCCUS EPIDERMIDIS  Culture, blood (Routine x 2)     Status: Abnormal   Collection Time: 03/12/18  4:34 PM  Result Value Ref Range Status   Specimen Description BLOOD RIGHT HAND  Final   Special Requests   Final    BOTTLES DRAWN AEROBIC AND ANAEROBIC Blood Culture adequate volume   Culture  Setup Time   Final    GRAM POSITIVE COCCI IN BOTH AEROBIC AND ANAEROBIC BOTTLES CRITICAL VALUE NOTED.  VALUE IS CONSISTENT WITH PREVIOUSLY REPORTED AND CALLED VALUE.    Culture (A)  Final     ENTEROCOCCUS FAECALIS STAPHYLOCOCCUS EPIDERMIDIS SUSCEPTIBILITIES PERFORMED ON PREVIOUS CULTURE WITHIN THE LAST 5 DAYS. FOR  ENTEROCOCCUS FAECALIS Performed at [facility], [place] [address] [place], [place] [address]    Report Status 03/15/2018 FINAL  Final   Organism ID, Bacteria STAPHYLOCOCCUS EPIDERMIDIS  Final      Susceptibility   Staphylococcus epidermidis - MIC*    CIPROFLOXACIN <=0.5 SENSITIVE Sensitive     ERYTHROMYCIN <=0.25 SENSITIVE Sensitive     GENTAMICIN <=0.5 SENSITIVE Sensitive     OXACILLIN <=0.25 SENSITIVE Sensitive     TETRACYCLINE <=1 SENSITIVE Sensitive     VANCOMYCIN <=0.5 SENSITIVE Sensitive     TRIMETH/SULFA 160 RESISTANT Resistant     CLINDAMYCIN <=0.25 SENSITIVE Sensitive     RIFAMPIN <=0.5 SENSITIVE Sensitive     Inducible Clindamycin NEGATIVE Sensitive     * STAPHYLOCOCCUS EPIDERMIDIS  Blood Culture ID Panel (Reflexed)     Status: Abnormal   Collection Time: 03/12/18  4:34 PM  Result Value Ref Range Status   Enterococcus species DETECTED (A) NOT DETECTED Final    Comment: CRITICAL RESULT CALLED TO, READ BACK BY AND VERIFIED WITH: PHARMD M MACCIA [id number] AT 1012 BY CM    Vancomycin resistance NOT DETECTED NOT DETECTED Final   Listeria monocytogenes NOT DETECTED NOT DETECTED Final   Staphylococcus species DETECTED (A) NOT DETECTED Final    Comment: Methicillin (oxacillin) susceptible coagulase negative staphylococcus. Possible blood culture contaminant (unless isolated from more than one blood culture draw or clinical case suggests pathogenicity). No antibiotic treatment is indicated for blood  culture contaminants. CRITICAL RESULT CALLED TO, READ BACK BY AND VERIFIED WITH: PHARMD M Portia [id number] AT 1012 BY CM    Staphylococcus aureus (BCID) NOT DETECTED NOT DETECTED Final   Methicillin resistance NOT DETECTED NOT DETECTED Final   Streptococcus species NOT DETECTED NOT DETECTED Final   Streptococcus agalactiae NOT DETECTED NOT DETECTED Final    Streptococcus pneumoniae NOT DETECTED NOT DETECTED Final   Streptococcus pyogenes NOT DETECTED NOT DETECTED Final   Acinetobacter baumannii NOT DETECTED NOT DETECTED Final   Enterobacteriaceae species NOT DETECTED NOT DETECTED Final   Enterobacter cloacae complex NOT DETECTED NOT DETECTED Final   Escherichia coli NOT DETECTED NOT DETECTED Final   Klebsiella oxytoca NOT DETECTED NOT DETECTED Final   Klebsiella pneumoniae NOT DETECTED NOT DETECTED Final   Proteus species NOT DETECTED NOT DETECTED Final   Serratia marcescens NOT DETECTED NOT DETECTED Final   Haemophilus influenzae NOT DETECTED NOT DETECTED Final   Neisseria meningitidis NOT DETECTED NOT DETECTED Final   Pseudomonas aeruginosa NOT DETECTED NOT DETECTED Final   Candida albicans NOT DETECTED NOT DETECTED Final   Candida glabrata NOT DETECTED NOT DETECTED Final   Candida krusei NOT DETECTED NOT DETECTED Final   Candida parapsilosis NOT DETECTED NOT DETECTED Final   Candida tropicalis NOT DETECTED NOT DETECTED Final    Comment: Performed at [facility], [address], [place], [place] [address]  Respiratory Panel by PCR     Status: None   Collection Time: 03/12/18  5:33 PM  Result Value Ref Range Status   Adenovirus NOT DETECTED NOT DETECTED Final   Coronavirus 229E NOT DETECTED NOT DETECTED Final    Comment: (NOTE) The Coronavirus on the Respiratory Panel, DOES NOT test for the novel  Coronavirus (2019 nCoV)    Coronavirus HKU1 NOT DETECTED NOT DETECTED Final   Coronavirus NL63 NOT DETECTED NOT DETECTED Final   Coronavirus OC43 NOT DETECTED NOT DETECTED Final   Metapneumovirus NOT DETECTED NOT DETECTED Final   Rhinovirus / Enterovirus NOT DETECTED NOT DETECTED Final  Influenza A NOT DETECTED NOT DETECTED Final   Influenza B NOT DETECTED NOT DETECTED Final   Parainfluenza Virus 1 NOT DETECTED NOT DETECTED Final   Parainfluenza Virus 2 NOT DETECTED NOT DETECTED Final   Parainfluenza Virus 3 NOT DETECTED NOT  DETECTED Final   Parainfluenza Virus 4 NOT DETECTED NOT DETECTED Final   Respiratory Syncytial Virus NOT DETECTED NOT DETECTED Final   Bordetella pertussis NOT DETECTED NOT DETECTED Final   Chlamydophila pneumoniae NOT DETECTED NOT DETECTED Final   Mycoplasma pneumoniae NOT DETECTED NOT DETECTED Final    Comment: Performed at [facility], [place] [address] [place], [place] [address]  Urine culture     Status: Abnormal   Collection Time: 03/12/18 10:14 PM  Result Value Ref Range Status   Specimen Description URINE, RANDOM  Final   Special Requests   Final    NONE Performed at [facility], [place] [address] [place], [place] [address]    Culture (A)  Final    >=100,000 COLONIES/mL ENTEROCOCCUS FAECALIS 80,000 COLONIES/mL STAPHYLOCOCCUS EPIDERMIDIS    Report Status 03/15/2018 FINAL  Final   Organism ID, Bacteria ENTEROCOCCUS FAECALIS (A)  Final   Organism ID, Bacteria STAPHYLOCOCCUS EPIDERMIDIS (A)  Final      Susceptibility   Enterococcus faecalis - MIC*    AMPICILLIN <=2 SENSITIVE Sensitive     LEVOFLOXACIN 0.5 SENSITIVE Sensitive     NITROFURANTOIN <=16 SENSITIVE Sensitive     VANCOMYCIN 1 SENSITIVE Sensitive     * >=100,000 COLONIES/mL ENTEROCOCCUS FAECALIS   Staphylococcus epidermidis - MIC*    CIPROFLOXACIN <=0.5 SENSITIVE Sensitive     GENTAMICIN <=0.5 SENSITIVE Sensitive     NITROFURANTOIN <=16 SENSITIVE Sensitive     OXACILLIN <=0.25 SENSITIVE Sensitive     TETRACYCLINE <=1 SENSITIVE Sensitive     VANCOMYCIN <=0.5 SENSITIVE Sensitive     TRIMETH/SULFA 40 SENSITIVE Sensitive     CLINDAMYCIN <=0.25 SENSITIVE Sensitive     RIFAMPIN <=0.5 SENSITIVE Sensitive     Inducible Clindamycin NEGATIVE Sensitive     * 80,000 COLONIES/mL STAPHYLOCOCCUS EPIDERMIDIS  Culture, blood (routine x 2)     Status: None (Preliminary result)   Collection Time: 03/14/18  7:41 AM  Result Value Ref Range Status   Specimen Description BLOOD RIGHT ANTECUBITAL  Final   Special Requests    Final    BOTTLES DRAWN AEROBIC AND ANAEROBIC Blood Culture adequate volume   Culture   Final    NO GROWTH 3 DAYS Performed at [facility], [address] [place], [place] [address]    Report Status PENDING  Incomplete  Culture, blood (routine x 2)     Status: None (Preliminary result)   Collection Time: 03/14/18  7:49 AM  Result Value Ref Range Status   Specimen Description BLOOD LEFT HAND  Final   Special Requests   Final    BOTTLES DRAWN AEROBIC AND ANAEROBIC Blood Culture adequate volume   Culture   Final    NO GROWTH 3 DAYS Performed at [facility], [place] [address] [place], [place] [address]    Report Status PENDING  Incomplete     Patient was seen and examined on the day of discharge and was found to be in stable condition. Time coordinating discharge: 40 minutes including assessment and coordination of care, as well as examination of the patient.   SIGNED:  Dessa Phi, DO [facility] [email or web address] 03/17/2018, 3:01 PM

## 2018-03-17 NOTE — [place] Notes (Signed)
[place] Note  Patient Name: Keith Murillo Date of Encounter: 03/17/2018  Primary Cardiologist: No primary care provider on file. New- Dr [name]  Subjective   Feels much better today. No chest pain. No dyspnea.   Inpatient Medications    Scheduled Meds:  aspirin  81 mg Oral Daily   carvedilol  12.5 mg Oral BID WC   enoxaparin (LOVENOX) injection  40 mg Subcutaneous Q24H   rosuvastatin  10 mg Oral q1800   vitamin C  500 mg Oral Daily   Continuous Infusions:  ampicillin-sulbactam (UNASYN) IV 3 g (03/17/18 0817)   PRN Meds: acetaminophen, HYDROcodone-acetaminophen, ondansetron (ZOFRAN) IV   Vital Signs    Vitals:   03/17/18 0019 03/17/18 0335 03/17/18 0401 03/17/18 0811  BP: (!) 124/105 (!) 146/91  (!) 144/86  Pulse: (!) 56   61  Resp: 20 (!) 23 19   Temp: 98.1 F (36.7 C) 98.2 F (36.8 C)    TempSrc: Oral Oral    SpO2: 93% 98%    Weight:   106.5 kg   Height:        Intake/Output Summary (Last 24 hours) at 03/17/2018 1031 Last data filed at 03/17/2018 0851 Gross per 24 hour  Intake 1140 ml  Output 220 ml  Net 920 ml   Last 3 Weights 03/17/2018 03/16/2018 03/16/2018  Weight (lbs) 234 lb 12.8 oz 234 lb 234 lb 14.4 oz  Weight (kg) 106.505 kg 106.142 kg 106.55 kg      Telemetry    NSR - Personally Reviewed  ECG    NSR with normal Ecg. - Personally Reviewed  Physical Exam   GEN: No acute distress.   Neck: No JVD Cardiac: RRR, no murmurs, rubs, or gallops.  Respiratory: Clear to auscultation bilaterally. GI: Soft, nontender, non-distended  MS: No edema; No deformity. Neuro:  Nonfocal  Psych: Normal affect   Labs    Chemistry Recent Labs  Lab 03/12/18 1655 03/13/18 0908 03/16/18 0251 03/17/18 0347  NA 139 138 134* 138  K 3.6 4.0 3.2* 3.9  CL 107 106 103 105  CO2 21* '23 22 23  '$ GLUCOSE 133* 112* 164* 140*  BUN '15 15 11 13  '$ CREATININE 1.23 1.14 0.83 0.95  CALCIUM 9.3 8.6* 8.1* 8.7*  PROT 5.9* 6.0*  --   --   ALBUMIN 3.5 3.3*  --    --   AST 42* 39  --   --   ALT 46* 43  --   --   ALKPHOS 60 49  --   --   BILITOT 1.2 1.0  --   --   GFRNONAA >60 >60 >60 >60  GFRAA >60 >60 >60 >60  ANIONGAP '11 9 9 10     '$ Hematology Recent Labs  Lab 03/14/18 0741 03/15/18 0226 03/17/18 0347  WBC 7.8 8.0 7.7  RBC 4.59 4.49 4.94  HGB 13.8 13.5 14.3  HCT 39.6 38.2* 42.2  MCV 86.3 85.1 85.4  MCH 30.1 30.1 28.9  MCHC 34.8 35.3 33.9  RDW 13.2 13.1 12.9  PLT 172 156 233    Cardiac Enzymes Recent Labs  Lab 03/13/18 0354 03/13/18 0908 03/13/18 1432 03/14/18 0741  TROPONINI 0.06* 0.12* 0.29* 0.23*    Recent Labs  Lab 03/12/18 1657  TROPIPOC 0.02     BNPNo results for input(s): BNP, PROBNP in the last 168 hours.   DDimer No results for input(s): DDIMER in the last 168 hours.   Radiology    No results found.  Cardiac Studies   Echo 03/13/18: IMPRESSIONS    1. The left ventricle has normal systolic function with an ejection fraction of 60-65%. The cavity size was normal. There is moderately increased left ventricular wall thickness. Left ventricular diastolic Doppler parameters are consistent with impaired  relaxation Indeterminent filling pressures The E/e' is 8-15. No evidence of left ventricular regional wall motion abnormalities.  2. The right ventricle has normal systolic function. The cavity was normal. There is no increase in right ventricular wall thickness.  3. Left atrial size was mildly dilated.  4. The mitral valve is degenerative. Mild thickening of the mitral valve leaflet. Mild calcification of the mitral valve leaflet.  5. The aortic valve was not well visualized.  6. The ascending aorta and aortic root are normal in size and structure.  7. No evidence of left ventricular regional wall motion abnormalities.  8. The interatrial septum was not well visualized.   Patient Profile     68 y.o. male with a hx of CAD status post remote PCI to the LAD and RCA, hyperlipidemia, obstructive sleep apnea on  CPAP and prostate cancer who presented to the ED on 3/7 with chief complaint of left-sided chest pain.  In the ED he met sepsis criteria and found to have positive blood cultures growing enterococcus species. Influenza and respiratory panels negative.  Also found to have elevated troponin.  He is being seen  for evaluation of chest pain and elevated troponin at the request of Dr. Marthenia Rolling, Internal Medicine.   Assessment & Plan    1. Elevated troponin in setting of sepsis and bacteremia. Troponin low level and relatively flat. Ecg normal. Echo shows normal LV function. Low suspicion for ACS. I think this is all demand ischemia. Will continue ASA, add statin and beta blocker. Consider ischemic evaluation after he recovers from infection as outpatient unless he has more angina. No angina. 2. Enterococcus bacteremia/sepsis. Appears to be a urinary source. Antibiotics per primary team. TEE negative for vegetations 3. CAD with remote NSTEMI in 2004. S/p stenting of the RCA and LAD. Lost to follow up since 2008.  4. HLD. LDL 100. With history of CAD he should be on a statin. Thinks he may have had issues with statins before but basically hasn't been taking any cardiac medication including ASA. Will start Crestor 10 mg daily. If he develops side effects we could consider alternative lipid lowering therapy. 5. OSA on CPAP 6. History of prostate CA.  7. HTN on no therapy prior. Now on Coreg.  History of cough on ACEi. I asked patient to keep a diary and bring with him to next [place] will sign off.   Medication Recommendations:  As per [facility] Other recommendations (labs, testing, etc):  none Follow up as an outpatient:  With Dr [name] [facility] heartcare in 3-4 weeks.       For questions or updates, please contact [place] Please consult [email or web address] for contact info under        Signed, Denard Tuminello [name], MD  03/17/2018, 10:31 AM

## 2018-03-18 ENCOUNTER — Telehealth: Payer: Self-pay

## 2018-03-18 NOTE — Telephone Encounter (Signed)
Transition Care Management Follow-up Telephone Call   Date discharged? 03/17/2018   How have you been since you were released from the hospital? Feels wiped out but better. No more fever or chest pain. No new symptoms.   Do you understand why you were in the hospital? yes   Do you understand the discharge instructions? yes   Where were you discharged to? home   Items Reviewed:  Medications reviewed:yes  Allergies reviewed: yes  Dietary changes reviewed: yes  Referrals reviewed: yes-follow up with cardiology and urology also.   Functional Questionnaire:  Activities of Daily Living (ADLs):   He states they are independent in the following: ambulation, feeding, grooming, hygiene, dressing, bathing, toileting. States they require assistance with the following: none  Any transportation issues/concerns?: none   Any patient concerns? Not at this time.   Confirmed importance and date/time of follow-up visits scheduled yes  Provider Appointment booked with Clarene Reamer, NP 03/26/2018.  Confirmed with patient if condition begins to worsen call PCP or go to the ER.  Patient was given the office number and encouraged to call back with question or concerns.  : yes

## 2018-03-18 NOTE — Telephone Encounter (Signed)
Noted  

## 2018-03-19 LAB — CULTURE, BLOOD (ROUTINE X 2)
Culture: NO GROWTH
Culture: NO GROWTH
Special Requests: ADEQUATE
Special Requests: ADEQUATE

## 2018-03-20 ENCOUNTER — Encounter: Payer: Self-pay | Admitting: Urology

## 2018-03-24 ENCOUNTER — Other Ambulatory Visit: Payer: Self-pay

## 2018-03-24 DIAGNOSIS — C61 Malignant neoplasm of prostate: Secondary | ICD-10-CM

## 2018-03-26 ENCOUNTER — Other Ambulatory Visit: Payer: Self-pay

## 2018-03-26 ENCOUNTER — Inpatient Hospital Stay: Payer: Medicare Other | Admitting: Family Medicine

## 2018-03-26 ENCOUNTER — Ambulatory Visit: Payer: Medicare Other | Admitting: Family Medicine

## 2018-03-26 ENCOUNTER — Other Ambulatory Visit: Payer: Medicare Other

## 2018-03-26 ENCOUNTER — Encounter: Payer: Self-pay | Admitting: Family Medicine

## 2018-03-26 VITALS — BP 118/72 | HR 63 | Temp 98.1°F | Ht 66.0 in | Wt 230.1 lb

## 2018-03-26 DIAGNOSIS — C61 Malignant neoplasm of prostate: Secondary | ICD-10-CM | POA: Diagnosis not present

## 2018-03-26 DIAGNOSIS — I2510 Atherosclerotic heart disease of native coronary artery without angina pectoris: Secondary | ICD-10-CM | POA: Diagnosis not present

## 2018-03-26 DIAGNOSIS — G479 Sleep disorder, unspecified: Secondary | ICD-10-CM

## 2018-03-26 DIAGNOSIS — K760 Fatty (change of) liver, not elsewhere classified: Secondary | ICD-10-CM

## 2018-03-26 DIAGNOSIS — E785 Hyperlipidemia, unspecified: Secondary | ICD-10-CM

## 2018-03-26 DIAGNOSIS — R7309 Other abnormal glucose: Secondary | ICD-10-CM

## 2018-03-26 DIAGNOSIS — Z09 Encounter for follow-up examination after completed treatment for conditions other than malignant neoplasm: Secondary | ICD-10-CM

## 2018-03-26 LAB — HEMOGLOBIN A1C: Hgb A1c MFr Bld: 5.9 % (ref 4.6–6.5)

## 2018-03-26 NOTE — Patient Instructions (Addendum)
Good to see you today  Remfresh- long acting melatonin  Follow up in 6 months

## 2018-03-26 NOTE — [place] Notes (Signed)
Subjective:    Patient ID: Keith Murillo, male    DOB: 1950/05/06, 68 y.o.   MRN: [id number]  HPI This is a 68 yo male who presents today for hospital follow up. Was recently hospitalized from 03/12/2018- 03/17/2018 with sepsis, though secondary to urinary source. He had enterococcus faecalis grow in urine culture. Blood cultures were negative. Negative TEE. Negative cardiac workup. Has follow up with urology in 2 weeks. Since hospitalization, has been doing ok. Finished antibiotic. No further hematuria, dysuria. No nausea/vomiting. Some dribbling when he needs to urinate. Slowly increasing activity.   Had some cough on lisinopril, was switched to carvedilol. No side effects. Home blood pressure readings 110s/ 70-80.   Sleep disturbance- uses CPAP, goes to sleep easily, wakes after a couple of hours and has difficulty going to sleep. This has gotten worse since getting home from the hospital, not sure if due to lying around more and daytime napping. Tried melatonin last night, unsure if helpful.   CAD- no follow up scheduled with cardiology. Will place referral.   Fatty liver- discussed with patient, he states that he and his wife are starting a time restrictive eating program.   Current Meds  Medication Sig  . aspirin 81 MG chewable tablet Chew 1 tablet (81 mg total) by mouth daily.  . carvedilol (COREG) 12.5 MG tablet Take 1 tablet (12.5 mg total) by mouth 2 (two) times daily with a meal.  . Cholecalciferol (VITAMIN D-3) 125 MCG (5000 UT) TABS Take 10,000 Units by mouth daily.  Marland Kitchen co-enzyme Q-10 30 MG capsule Take 30 mg by mouth 3 (three) times daily.  Marland Kitchen CRANBERRY PO Take 1 capsule by mouth daily.  . Cyanocobalamin (VITAMIN B-12 PO) Take 1 tablet by mouth daily.  Marland Kitchen DHEA 25 MG CAPS Take 25 mg by mouth daily.   . Omega-3 Fatty Acids (OMEGA 3 PO) Take 1 capsule by mouth daily.  Marland Kitchen OVER THE COUNTER MEDICATION Take 1,300 mg by mouth See admin instructions. [place] grape capsules - 650 mg  each - take 2 capsules by mouth daily  . OVER THE COUNTER MEDICATION Take 1 capsule by mouth daily. EDTA  . rosuvastatin (CRESTOR) 10 MG tablet Take 1 tablet (10 mg total) by mouth daily at 6 PM.  . sildenafil (REVATIO) 20 MG tablet 2-5 tabs 1 hour prior to intercourse (Patient taking differently: Take 60-80 mg by mouth daily as needed (prior to intercourse). )  . TURMERIC PO Take 1 capsule by mouth daily.  . vitamin C (ASCORBIC ACID) 500 MG tablet Take 500 mg by mouth daily.     Past Medical History:  Diagnosis Date  . Chest pain   . Heart attack ([place])   . Hypercholesterolemia   . Hyperlipidemia   . Ischemic heart disease September 2004   Known--with non-Q-wave myocardial infarction   . Obesity   . Sleep apnea    Past Surgical History:  Procedure Laterality Date  . CARDIAC CATHETERIZATION  11/2007  . TEE WITHOUT CARDIOVERSION N/A 03/16/2018   Procedure: TRANSESOPHAGEAL ECHOCARDIOGRAM (TEE);  Surgeon: Acie Fredrickson Wonda Cheng, MD;  Location: [facility] ENDOSCOPY;  Service: Cardiovascular;  Laterality: N/A;   Family History  Problem Relation Age of Onset  . Cancer Mother   . Hearing loss Father   . Cancer Maternal Grandmother   . Parkinson's disease Maternal Grandfather   . Stroke Maternal Grandfather   . Stroke Paternal Grandmother   . Heart attack Paternal Grandfather    Social History   Tobacco Use  .  Smoking status: Never Smoker  . Smokeless tobacco: Never Used  Substance Use Topics  . Alcohol use: Yes    Comment: occas  . Drug use: No      Review of Systems Per HPI    Objective:   Physical Exam Physical Exam  Vitals reviewed. Constitutional: Oriented to person, place, and time. Appears well-developed and well-nourished.  HENT:  Head: Normocephalic and atraumatic.  Eyes: Conjunctivae are normal.  Neck: Normal range of motion. Neck supple.  Cardiovascular: Normal rate.   Pulmonary/Chest: Effort normal.  Musculoskeletal: Normal range of motion.  Neurological: Alert and  oriented to person, place, and time.  Psychiatric: Normal mood and affect. Behavior is normal. Judgment and thought content normal.         BP 118/72   Pulse 63   Temp 98.1 F (36.7 C)   Ht 5\' 6"  (1.676 m)   Wt 230 lb 1.9 oz (104.4 kg)   SpO2 98%   BMI 37.14 kg/m  Wt Readings from Last 3 Encounters:  03/26/18 230 lb 1.9 oz (104.4 kg)  03/17/18 234 lb 12.8 oz (106.5 kg)  11/19/17 235 lb (106.6 kg)    Assessment & Plan:  1. Hospital discharge follow-up - RTC precautions reviewed, continue to increase activity as tolerated - Ambulatory referral to Cardiology - Follow up as scheduled with urology  2. Coronary artery disease involving native coronary artery of native heart without angina pectoris - currently asymptomatic, negative work up in hospital, referral placed for regular follow up - Ambulatory referral to Cardiology  3. Hyperlipidemia, unspecified hyperlipidemia type - LDL 62, at goal, continue rosuvastatin 10 mg - Ambulatory referral to Cardiology  4. Elevated glucose - some elevated readings in hospital, will check A1C - Hemoglobin A1c  5. Fatty liver - discussed diagnosis and importance of weight loss  - follow up in 6 months for CPE  6. Sleep disturbance - likely related to recent hospitalization, discussed using otc melatonin products, increasing daytime activity/exercise, decreasing napping   Clarene Reamer, FNP-BC  [facility], [place]  03/26/2018 10:19 AM

## 2018-03-27 LAB — PSA: Prostate Specific Ag, Serum: 3.5 ng/mL (ref 0.0–4.0)

## 2018-03-29 ENCOUNTER — Other Ambulatory Visit: Payer: Self-pay | Admitting: Urology

## 2018-03-30 ENCOUNTER — Other Ambulatory Visit: Payer: Self-pay | Admitting: Urology

## 2018-03-31 ENCOUNTER — Ambulatory Visit
Admission: RE | Admit: 2018-03-31 | Discharge: 2018-03-31 | Disposition: A | Discharge status: Home / Self Care | Payer: Medicare Other | Source: Ambulatory Visit | Attending: Urology | Admitting: Urology

## 2018-03-31 ENCOUNTER — Other Ambulatory Visit: Payer: Self-pay

## 2018-03-31 DIAGNOSIS — C61 Malignant neoplasm of prostate: Secondary | ICD-10-CM

## 2018-03-31 MED ORDER — GADOBENATE DIMEGLUMINE 529 MG/ML IV SOLN
20.0000 mL | Freq: Once | INTRAVENOUS | Status: AC | PRN
  Administered 2018-03-31: 20 mL via INTRAVENOUS

## 2018-04-01 ENCOUNTER — Encounter: Payer: Self-pay | Admitting: Urology

## 2018-04-01 ENCOUNTER — Ambulatory Visit (INDEPENDENT_AMBULATORY_CARE_PROVIDER_SITE_OTHER): Payer: Medicare Other | Admitting: Urology

## 2018-04-01 ENCOUNTER — Other Ambulatory Visit: Payer: Self-pay

## 2018-04-01 DIAGNOSIS — C61 Malignant neoplasm of prostate: Secondary | ICD-10-CM | POA: Diagnosis not present

## 2018-04-01 MED ORDER — SILDENAFIL CITRATE 20 MG PO TABS: ORAL_TABLET | ORAL | 1 refills | Status: DC

## 2018-04-01 NOTE — [place] Notes (Signed)
Virtual Visit via Telephone Note  I connected with Keith Murillo on 04/01/18 at 10:00 AM EDT by telephone and verified that I am speaking with the correct person using two identifiers.   I discussed the limitations, risks, security and privacy concerns of performing an evaluation and management service by telephone and the availability of in person appointments. We discussed the impact of the COVID-19 on the healthcare system, and the importance of social distancing and reducing patient and provider exposure. I also discussed with the patient that there may be a patient responsible charge related to this service. The patient expressed understanding and agreed to proceed.  Reason for visit: Elevated PSA  Urologic history: 1. cT1c prostate cancer; PSA 4.6; volume 62 g; 1/12 cores 3 +3 adenocarcinoma left lateral apex (23%) -Elected active surveillance  2.  Hypogonadism -TRT discontinued  3.  Erectile dysfunction -Generic sildenafil, venous compression band  History of Present Illness: 68 year old male contacted to discuss MRI results.  Since his last visit he was hospitalized at [facility] on 03/12/2018 for sepsis from a suspected urinary source.  He developed chills and fever to 103 degrees with myalgias and malaise.  He had an episode of gross hematuria, frequency, urgency and dysuria 1 to 2 days prior to the symptoms.  Urine culture grew enterococcus and staph epi.  Blood cultures were negative.  CT abdomen pelvis showed no hydronephrosis, perinephric stranding or urinary calculi.  He was discharged on 3/10 and states he is presently asymptomatic.  A PSA drawn on 04/01/2018 was below baseline at 3.5.  Prostate MRI performed on 03/31/2018 showed no radiographic evidence of high-grade prostate cancer.  Prostate volume was 57 cc   Assessment and Plan: 68 year old male with low risk prostate cancer on active surveillance.  Recent hospitalization for sepsis secondary to urinary source.   His clinical course is consistent with acute prostatitis however his PSA remains low.  Prostate MRI shows no lesions suspicious for intermediate/high risk prostate cancer.  Follow Up: I recommended a follow-up visit for PSA/DRE July 2020 which is 1 year from his diagnosis.  I am reluctant to perform a confirmatory biopsy at this time with his recent septic episode.  He also requested a refill sildenafil.   I discussed the assessment and treatment plan with the patient. The patient was provided an opportunity to ask questions and all were answered. The patient agreed with the plan and demonstrated an understanding of the instructions.   The patient was advised to call back or seek an in-person evaluation if the symptoms worsen or if the condition fails to improve as anticipated.  I provided 12 minutes of non-face-to-face time during this encounter.   Abbie Sons, MD

## 2018-04-02 ENCOUNTER — Ambulatory Visit: Payer: Medicare Other | Admitting: Urology

## 2018-04-05 DIAGNOSIS — G4733 Obstructive sleep apnea (adult) (pediatric): Secondary | ICD-10-CM | POA: Diagnosis not present

## 2018-04-20 ENCOUNTER — Ambulatory Visit: Payer: Medicare Other | Admitting: Urology

## 2018-05-05 ENCOUNTER — Telehealth: Payer: Self-pay | Admitting: Cardiovascular Disease

## 2018-05-05 NOTE — Telephone Encounter (Signed)
Smartphone/consent/ my chart/ pre reg completed °

## 2018-05-06 ENCOUNTER — Telehealth (INDEPENDENT_AMBULATORY_CARE_PROVIDER_SITE_OTHER): Payer: Medicare Other | Admitting: Cardiovascular Disease

## 2018-05-06 VITALS — BP 143/71 | HR 60 | Ht 66.0 in | Wt 227.5 lb

## 2018-05-06 DIAGNOSIS — I2489 Other forms of acute ischemic heart disease: Secondary | ICD-10-CM

## 2018-05-06 DIAGNOSIS — I248 Other forms of acute ischemic heart disease: Secondary | ICD-10-CM

## 2018-05-06 DIAGNOSIS — G4733 Obstructive sleep apnea (adult) (pediatric): Secondary | ICD-10-CM

## 2018-05-06 DIAGNOSIS — I2510 Atherosclerotic heart disease of native coronary artery without angina pectoris: Secondary | ICD-10-CM

## 2018-05-06 DIAGNOSIS — Z8546 Personal history of malignant neoplasm of prostate: Secondary | ICD-10-CM

## 2018-05-06 MED ORDER — CARVEDILOL 6.25 MG PO TABS: 6.2500 mg | ORAL_TABLET | Freq: Two times a day (BID) | ORAL | 0 refills | Status: DC

## 2018-05-06 MED ORDER — ROSUVASTATIN CALCIUM 10 MG PO TABS: 10.0000 mg | ORAL_TABLET | Freq: Every day | ORAL | 0 refills | Status: DC

## 2018-05-06 NOTE — Patient Instructions (Signed)
Medication Instructions:  Start Carvedilol 6.25 mg twice daily. Start Rosuvastatin 10 mg daily.  If you need a refill on your cardiac medications before your next appointment, please call your pharmacy.   Lab work: CMET, LIPID before follow up in 2 months. Attached are the lab orders that are needed before your upcoming appointment, please come in anytime to have your labs drawn.   They are fasting labs, so nothing to eat or drink after midnight.  Lab hours: 8:00-4:00 lunch hours 12:45-1:45  If you have labs (blood work) drawn today and your tests are completely normal, you will receive your results only by: Marland Kitchen MyChart Message (if you have MyChart) OR . A paper copy in the mail If you have any lab test that is abnormal or we need to change your treatment, we will call you to review the results.  Testing/Procedures: Your physician has requested that you have an Exercise Myoview. A cardiac stress test is a cardiological test that measures the heart's ability to respond to external stress in a controlled clinical environment. The stress response is induced by exercise (exercise-treadmill). For further information please visit [email or web address].[email or web address]. If you have questions or concerns about your appointment, you can call the Nuclear Lab at [phone].  Hold: Carvedilol the day of the testing and the night before testing.   Follow-Up: At [facility], you and your health needs are our priority.  As part of our continuing mission to provide you with exceptional heart care, we have created designated Provider Care Teams.  These Care Teams include your primary Cardiologist (physician) and Advanced Practice Providers (APPs -  Physician Assistants and Nurse Practitioners) who all work together to provide you with the care you need, when you need it. You will need a follow up appointment in 2 months. You may see Dr.Kelly or one of the following Advanced Practice Providers on your designated Care  Team: Almyra Deforest, [place] . Fabian Sharp, PA-C

## 2018-05-06 NOTE — Progress Notes (Signed)
Virtual Visit via Video Note   This visit type was conducted due to national recommendations for restrictions regarding the COVID-19 Pandemic (e.g. social distancing) in an effort to limit this patient's exposure and mitigate transmission in our community.  Due to his co-morbid illnesses, this patient is at least at moderate risk for complications without adequate follow up.  This format is felt to be most appropriate for this patient at this time.  All issues noted in this document were discussed and addressed.  A limited physical exam was performed with this format.  Please refer to the patient's chart for his consent to telehealth for [facility].   Evaluation Performed:  Follow-up visit  Date:  05/06/2018   ID:  Keith Murillo, DOB Oct 03, 1950, MRN [id number]  Patient Location: Home Provider Location: Home  PCP:  [name], FNP  Cardiologist:  (new) [name] Electrophysiologist:  None   Chief Complaint: Initial evaluation with me.  Remotely the patient had seen [name] but had not seen a cardiologist over the past 10 years.  He recently underwent a cardiology consultation during his hospitalization by [name] apparently presents to me for cardiology follow-up  History of Present Illness:    Keith Murillo is a 68 y.o. male who remotely had undergone PTCA of the vessel in the early 2000's and later underwent stenting at 3 sites by [name] in 2004 with his last stent in 2009.  Apparently he had not seen a follow-up cardiologist after [name] retired.  He has a history of recently diagnosed prostate CA, hypertension, obstructive sleep apnea on CPAP followed by 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  After admission he was found to have a temperature of 103.1.  Influenza A and influenza B were excluded.  [facility] [name] panel was negative.  Blood and urine cultures were  positive for enterococcus species and Staphylococcus.  He was treated with antibiotics with improvement.  During his hospital evaluation in early March he was seen by [name] for cardiology consult due to minimally positive troponins with a flat plateau which was felt to be consistent with demand ischemia.  He also underwent a 2D echo Doppler as well as a TEE which did not reveal any vegetative endocarditis.  EF of 60 to 65%.  He ultimately was discharged on carvedilol 12.5 mg twice a day, rosuvastatin 10 mg in addition to aspirin.  He had seen his primary,[name], FNP in follow-up and was doing well.  Apparently when his carvedilol and rosuvastatin prescriptions which were given to him at discharge ran out he did not have refills and as result has not been these medicines for several weeks.  He denies any fevers chills night sweats.  He denies any anginal type symptoms.  He has been active at home.  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  He presents for initial evaluation with me following his hospitalization.  The patient does not have symptoms concerning for COVID-19 infection (fever, chills, cough, or new shortness of breath).    Past Medical History:  Diagnosis Date  . Chest pain   . Heart attack ([place])   . Hypercholesterolemia   . Hyperlipidemia   . Ischemic heart disease September 2004   Known--with non-Q-wave myocardial infarction   . Obesity   . Sleep apnea  Past Surgical History:  Procedure Laterality Date  . CARDIAC CATHETERIZATION  11/2007  . TEE WITHOUT CARDIOVERSION N/A 03/16/2018   Procedure: TRANSESOPHAGEAL ECHOCARDIOGRAM (TEE);  Surgeon: Thayer Headings, MD;  Location: [facility] ENDOSCOPY;  Service: Cardiovascular;  Laterality: N/A;     Current Meds  Medication Sig  . aspirin 81 MG chewable tablet Chew 1 tablet (81 mg total) by mouth daily.  . Cholecalciferol (VITAMIN D-3) 125 MCG  (5000 UT) TABS Take 10,000 Units by mouth daily.  Marland Kitchen co-enzyme Q-10 30 MG capsule Take 30 mg by mouth 3 (three) times daily.  Marland Kitchen CRANBERRY PO Take 1 capsule by mouth daily.  . Cyanocobalamin (VITAMIN B-12 PO) Take 1 tablet by mouth daily.  Marland Kitchen DHEA 25 MG CAPS Take 25 mg by mouth daily.   Marland Kitchen OVER THE COUNTER MEDICATION Take 1,300 mg by mouth See admin instructions. Muscadine grape capsules - 650 mg each - take 2 capsules by mouth daily  . OVER THE COUNTER MEDICATION Take 1 capsule by mouth daily. EDTA  . sildenafil (REVATIO) 20 MG tablet 2-5 tabs 1 hour prior to intercourse  . TURMERIC PO Take 1 capsule by mouth daily.  . vitamin C (ASCORBIC ACID) 500 MG tablet Take 500 mg by mouth daily.     Allergies:   Lisinopril   Social History   Tobacco Use  . Smoking status: Never Smoker  . Smokeless tobacco: Never Used  Substance Use Topics  . Alcohol use: Yes    Comment: occas  . Drug use: No     Family Hx: The patient's family history includes Cancer in his maternal grandmother and mother; Hearing loss in his father; Heart attack in his paternal grandfather; Parkinson's disease in his maternal grandfather; Stroke in his maternal grandfather and paternal grandmother.  ROS:   Please see the history of present illness.    Moderate obesity No fevers chills night sweats.  No cough.  No loss of taste or sense of smell no chills No wheezing He denies chest pressure or tightness No palpitations No presyncope or syncope No abdominal discomfort No myalgias No significant edema All other systems reviewed and are negative.   Prior CV studies:   The following studies were reviewed today:   ECHO  IMPRESSION:  03/13/2018  1. The left ventricle has normal systolic function with an ejection fraction of 60-65%. The cavity size was normal. There is moderately increased left ventricular wall thickness. Left ventricular diastolic Doppler parameters are consistent with impaired  relaxation Indeterminent  filling pressures The E/e' is 8-15. No evidence of left ventricular regional wall motion abnormalities.  2. The right ventricle has normal systolic function. The cavity was normal. There is no increase in right ventricular wall thickness.  3. Left atrial size was mildly dilated.  4. The mitral valve is degenerative. Mild thickening of the mitral valve leaflet. Mild calcification of the mitral valve leaflet.  5. The aortic valve was not well visualized.  6. The ascending aorta and aortic root are normal in size and structure.  7. No evidence of left ventricular regional wall motion abnormalities.  8. The interatrial septum was not well visualized.  SUMMARY   LVEF 60-65%, moderate LVH, normal wall motion, grade 1 DD, indeterminate LV filling pressure, mild LAE, mild MR, mild TR, RVSP 29 mmHg + RAP, IVC not visualized    TEE IMPRESSIONS  03/16/2018  1. The left ventricle has normal systolic function with an ejection fraction of 60-65%. The cavity size was normal. Left ventricular  diastolic function could not be evaluated.  2. The right ventricle has normal systolic function. The cavity was normal. There is no increase in right ventricular wall thickness.  3. Left atrial appendage : No evidence of thrombi.  4. Mitral valve regurgitation is mild to moderate by color flow Doppler. No mitral valve vegetation visualized.  5. The aortic valve is tricuspid.  FINDINGS  Left Ventricle: The left ventricle has normal systolic function, with an ejection fraction of 60-65%. The cavity size was normal. There is no increase in left ventricular wall thickness. Left ventricular diastolic function could not be evaluated. Right Ventricle: The right ventricle has normal systolic function. The cavity was normal. There is no increase in right ventricular wall thickness. Left Atrium: left atrial size was normal in size Left atrial appendage : No evidence of thrombi. Right Atrium: right atrial size was normal in  size. Interatrial Septum: No atrial level shunt detected by color flow Doppler. Pericardium: There is no evidence of pericardial effusion. Mitral Valve: The mitral valve is normal in structure. Mitral valve regurgitation is mild to moderate by color flow Doppler. There is no evidence of mitral valve vegetation. Tricuspid Valve: The tricuspid valve is normal in structure. Tricuspid valve regurgitation is mild by color flow Doppler. No TV vegetation was visualized. Aortic Valve: The aortic valve is tricuspid Aortic valve regurgitation was not visualized by color flow Doppler. There is no evidence of a vegetation on the aortic valve. Pulmonic Valve: The pulmonic valve was grossly normal. Pulmonic valve regurgitation is not visualized by color flow Doppler.     Labs/Other Tests and Data Reviewed:    EKG:  An ECG dated 03/14/2018 was personally reviewed today and demonstrated:  Normal sinus rhythm at 77 without ST-T abnormalities  Recent Labs: 03/13/2018: ALT 43; Magnesium 1.7; TSH 0.833 03/17/2018: BUN 13; Creatinine, Ser 0.95; Hemoglobin 14.3; Platelets 233; Potassium 3.9; Sodium 138   Recent Lipid Panel Lab Results  Component Value Date/Time   CHOL 142 03/15/2018 02:26 AM   TRIG 233 (H) 03/15/2018 02:26 AM   HDL 33 (L) 03/15/2018 02:26 AM   CHOLHDL 4.3 03/15/2018 02:26 AM   LDLCALC 62 03/15/2018 02:26 AM    Wt Readings from Last 3 Encounters:  05/06/18 227 lb 8 oz (103.2 kg)  03/26/18 230 lb 1.9 oz (104.4 kg)  03/17/18 234 lb 12.8 oz (106.5 kg)     Objective:    Vital Signs:  BP (!) 143/71   Pulse 60   Ht '5\' 6"'$  (1.676 m)   Wt 227 lb 8 oz (103.2 kg)   BMI 36.72 kg/m    He appears well-developed and well-nourished in no acute distress He appears euvolemic HEENT is unremarkable He has a thick neck There is no wheezing Breathing is normal and unlabored He denies any heart rate irregularity.  There is no chest discomfort to palpation He denies any abdominal tenderness There is  no edema Neurologically appears intact He has normal cognition, mood and affect  ASSESSMENT & PLAN:    1. CAD: The patient has a history of prior interventions with initial PTCA by Dr. [name], subsequent 3 stent placement in 2004 by [name] and most recently in 2009.  He has been without anginal symptomatology since that time and apparently had not been followed by cardiology.  With his recent mild pulmonary positivity with a flat plateau suspect demand ischemia in the setting of his sepsis.  Will reinstitute carvedilol at a lower dose of 6.25 mg twice a day  since his resting pulse he states is typically in the 60s.  He was sent home from the hospital on 12.5 mg twice daily.  I will also reinstitute statin therapy with rosuvastatin 10 mg which he said he tolerated well.  As per initial cardiology recommendations, in light of his recent mild troponin elevation will plan outpatient noninvasive assessment of his CAD. 2. Obstructive sleep apnea: Currently using CPAP with compliance.  He is sleeping well.  He denies residual daytime sleepiness or awareness of breakthrough snoring. 3. Essential hypertension: BP today is slightly elevated since he has discontinued the carvedilol.  Will reinstitute.  Optimal blood pressure less than 120/80 with stage I hypertension beginning at 130/80. 4. Hyperlipidemia with target LDL less than 70: We will resume rosuvastatin 10 mg 5. History of prostate CA. 6. Obesity with BMI of 36: Discussed the importance of weight loss with reference to his obstructive sleep apnea as well as his cardiovascular disease.  Discussed the benefits of Mediterranean diet.  Recommended increase exercise with walking at least 30 minutes for 5 days a week if at all possible     COVID-19 Education: The signs and symptoms of COVID-19 were discussed with the patient and how to seek care for testing (follow up with PCP or arrange E-visit).  The importance of social distancing was discussed  today.  Time:   Today, I have spent 25 minutes with the patient with telehealth technology discussing the above problems.     Medication Adjustments/Labs and Tests Ordered: Current medicines are reviewed at length with the patient today.  Concerns regarding medicines are outlined above.   Tests Ordered: No orders of the defined types were placed in this encounter.   Medication Changes: No orders of the defined types were placed in this encounter.   Disposition:  Follow up laboratory will be obtained in approximately 4 to 6 weeks with a lipid and metabolic panel.  I have recommended he undergo an exercise Myoview study once the studies are allowed to be rescheduled during the [place] pandemic in follow-up of his recent mild troponin elevation and known CAD.  I will see him in the office follow-up office exercise study.  Signed, Shelva Majestic, MD  05/06/2018 9:58 AM    [facility]

## 2018-07-07 ENCOUNTER — Other Ambulatory Visit: Payer: Self-pay

## 2018-07-07 ENCOUNTER — Ambulatory Visit (HOSPITAL_COMMUNITY)
Admission: RE | Admit: 2018-07-07 | Discharge: 2018-07-07 | Disposition: A | Discharge status: Home / Self Care | Payer: Medicare Other | Source: Ambulatory Visit | Attending: Cardiovascular Disease | Admitting: Cardiovascular Disease

## 2018-07-07 DIAGNOSIS — I2510 Atherosclerotic heart disease of native coronary artery without angina pectoris: Secondary | ICD-10-CM | POA: Insufficient documentation

## 2018-07-07 LAB — MYOCARDIAL PERFUSION IMAGING
LV dias vol: 130 mL (ref 62–150)
LV sys vol: 58 mL
Peak HR: 68 {beats}/min
Rest HR: 55 {beats}/min
SDS: 1
SRS: 2
SSS: 3
TID: 1.07

## 2018-07-07 MED ORDER — TECHNETIUM TC 99M TETROFOSMIN IV KIT
10.3000 | PACK | Freq: Once | INTRAVENOUS | Status: AC | PRN
  Administered 2018-07-07: 10.3 via INTRAVENOUS
  Filled 2018-07-07: qty 11

## 2018-07-07 MED ORDER — AMINOPHYLLINE 25 MG/ML IV SOLN
75.0000 mg | Freq: Once | INTRAVENOUS | Status: AC
  Administered 2018-07-07: 75 mg via INTRAVENOUS

## 2018-07-07 MED ORDER — TECHNETIUM TC 99M TETROFOSMIN IV KIT
30.4000 | PACK | Freq: Once | INTRAVENOUS | Status: AC | PRN
  Administered 2018-07-07: 30.4 via INTRAVENOUS
  Filled 2018-07-07: qty 31

## 2018-07-07 MED ORDER — REGADENOSON 0.4 MG/5ML IV SOLN
0.4000 mg | Freq: Once | INTRAVENOUS | Status: AC
  Administered 2018-07-07: 0.4 mg via INTRAVENOUS

## 2018-07-19 ENCOUNTER — Other Ambulatory Visit: Payer: Medicare Other

## 2018-07-19 ENCOUNTER — Other Ambulatory Visit: Payer: Self-pay

## 2018-07-19 DIAGNOSIS — C61 Malignant neoplasm of prostate: Secondary | ICD-10-CM

## 2018-07-20 LAB — PSA: Prostate Specific Ag, Serum: 2.8 ng/mL (ref 0.0–4.0)

## 2018-07-28 ENCOUNTER — Ambulatory Visit: Payer: Medicare Other | Admitting: Urology

## 2018-07-30 ENCOUNTER — Ambulatory Visit: Payer: Medicare Other | Admitting: Urology

## 2018-07-30 ENCOUNTER — Other Ambulatory Visit: Payer: Self-pay

## 2018-07-30 ENCOUNTER — Encounter: Payer: Self-pay | Admitting: Urology

## 2018-07-30 VITALS — BP 166/90 | HR 76 | Ht 66.0 in | Wt 232.4 lb

## 2018-07-30 DIAGNOSIS — N5201 Erectile dysfunction due to arterial insufficiency: Secondary | ICD-10-CM | POA: Diagnosis not present

## 2018-07-30 DIAGNOSIS — C61 Malignant neoplasm of prostate: Secondary | ICD-10-CM

## 2018-07-30 NOTE — Progress Notes (Signed)
07/30/2018 8:58 AM   Keith Murillo [birth year] [id number]  Referring provider: [name], [address] [place],  [place] [address]  Chief Complaint  Patient presents with  . Hypogonadism  . Prostate Cancer    Urologic history: 1. cT1cprostate cancer; PSA 4.6; volume 62 g; 1/12 cores 3 +3 adenocarcinoma left lateral apex (23%) -Elected active surveillance  2.Hypogonadism -TRT discontinued  3.Erectile dysfunction -Generic sildenafil, venous compression band  HPI: 68 year old male presents for follow-up of low risk prostate cancer on active surveillance.  He had a virtual visit in March 2020 and was hospitalized in [place] for sepsis from urinary source.  He has had no recurrent infections.  He has no voiding complaints.  PSA performed on 07/19/2018 was 2.8.  Prostate MRI performed March 2020 showed no abnormalities suspicious for high-grade cancer.  He is using sildenafil for ED with moderate efficacy.   PMH: Past Medical History:  Diagnosis Date  . Chest pain   . Heart attack ([place])   . Hypercholesterolemia   . Hyperlipidemia   . Ischemic heart disease September 2004   Known--with non-Q-wave myocardial infarction   . Obesity   . Sleep apnea     Surgical History: Past Surgical History:  Procedure Laterality Date  . CARDIAC CATHETERIZATION  11/2007  . TEE WITHOUT CARDIOVERSION N/A 03/16/2018   Procedure: TRANSESOPHAGEAL ECHOCARDIOGRAM (TEE);  Surgeon: Acie Fredrickson Wonda Cheng, MD;  Location: [facility] ENDOSCOPY;  Service: Cardiovascular;  Laterality: N/A;    Home Medications:  Allergies as of 07/30/2018      Reactions   Lisinopril Cough      Medication List       Accurate as of July 30, 2018  8:58 AM. If you have any questions, ask your nurse or doctor.        STOP taking these medications   rosuvastatin 10 MG tablet Commonly known as: CRESTOR Stopped by: Abbie Sons, MD     TAKE these medications   aspirin 81 MG chewable tablet  Chew 1 tablet (81 mg total) by mouth daily.   carvedilol 6.25 MG tablet Commonly known as: COREG Take 1 tablet (6.25 mg total) by mouth 2 (two) times daily with a meal.   co-enzyme Q-10 30 MG capsule Take 30 mg by mouth 3 (three) times daily.   CRANBERRY PO Take 1 capsule by mouth daily.   DHEA 25 MG Caps Take 25 mg by mouth daily.   OVER THE COUNTER MEDICATION Take 1,300 mg by mouth See admin instructions. [place]cad[place] grape capsules - 650 mg each - take 2 capsules by mouth daily   OVER THE COUNTER MEDICATION Take 1 capsule by mouth daily. EDTA   sildenafil 20 MG tablet Commonly known as: REVATIO 2-5 tabs 1 hour prior to intercourse   TURMERIC PO Take 1 capsule by mouth daily.   VITAMIN B-12 PO Take 1 tablet by mouth daily.   vitamin C 500 MG tablet Commonly known as: ASCORBIC ACID Take 500 mg by mouth daily.   Vitamin D-3 125 MCG (5000 UT) Tabs Take 10,000 Units by mouth daily.       Allergies:  Allergies  Allergen Reactions  . Lisinopril Cough    Family History: Family History  Problem Relation Age of Onset  . Cancer Mother   . Hearing loss Father   . Cancer Maternal Grandmother   . Parkinson's disease Maternal Grandfather   . Stroke Maternal Grandfather   . Stroke Paternal Grandmother   . Heart attack  Paternal Grandfather     Social History:  reports that he has never smoked. He has never used smokeless tobacco. He reports current alcohol use. He reports that he does not use drugs.  ROS: UROLOGY Frequent Urination?: No Hard to postpone urination?: No Burning/pain with urination?: No Get up at night to urinate?: No Leakage of urine?: No Urine stream starts and stops?: No Trouble starting stream?: No Do you have to strain to urinate?: No Blood in urine?: No Urinary tract infection?: No Sexually transmitted disease?: No Injury to kidneys or bladder?: No Painful intercourse?: No Weak stream?: No Erection problems?: No Penile pain?: No   Gastrointestinal Nausea?: No Vomiting?: No Indigestion/heartburn?: No Diarrhea?: No Constipation?: No  Constitutional Fever: No Night sweats?: No Weight loss?: No Fatigue?: No  Skin Skin rash/lesions?: No Itching?: No  Eyes Blurred vision?: No Double vision?: No  Ears/Nose/Throat Sore throat?: No Sinus problems?: No  Hematologic/Lymphatic Swollen glands?: No Easy bruising?: No  Cardiovascular Leg swelling?: No Chest pain?: No  Respiratory Cough?: No Shortness of breath?: No  Endocrine Excessive thirst?: No  Musculoskeletal Back pain?: No Joint pain?: Yes  Neurological Headaches?: No Dizziness?: No  Psychologic Depression?: No Anxiety?: No  Physical Exam: BP (!) 166/90 (BP Location: Left Arm, Patient Position: Sitting, Cuff Size: Large)   Pulse 76   Ht 5\' 6"  (1.676 m)   Wt 232 lb 6.4 oz (105.4 kg)   BMI 37.51 kg/m   Constitutional:  Alert and oriented, No acute distress. HEENT: [place] AT, moist mucus membranes.  Trachea midline, no masses. Cardiovascular: No clubbing, cyanosis, or edema. Respiratory: Normal respiratory effort, no increased work of breathing. GU: Prostate 50 g, smooth without nodules Skin: No rashes, bruises or suspicious lesions. Neurologic: Grossly intact, no focal deficits, moving all 4 extremities. Psychiatric: Normal mood and affect.   Assessment & Plan:    -T1c low risk prostate cancer on active surveillance.  PSA and DRE are stable.  Recent prostate MRI showed no lesions suspicious for high-grade cancer.  With recent hospitalization for UTI/sepsis will hold off on confirmatory biopsy at this time based on MRI, PSA and DRE findings.  Follow-up 6 months for PSA/DRE   -Erectile dysfunction stable on sildenafil   Abbie Sons, [facility] [address], [place],  [address] [phone]

## 2018-08-31 ENCOUNTER — Other Ambulatory Visit: Payer: Self-pay

## 2018-09-21 ENCOUNTER — Other Ambulatory Visit: Payer: Self-pay

## 2018-09-21 MED ORDER — ROSUVASTATIN CALCIUM 10 MG PO TABS: 10.0000 mg | ORAL_TABLET | Freq: Every day | ORAL | 3 refills | Status: DC

## 2018-10-19 ENCOUNTER — Other Ambulatory Visit: Payer: Self-pay

## 2018-10-20 MED ORDER — SILDENAFIL CITRATE 20 MG PO TABS: ORAL_TABLET | ORAL | 1 refills | Status: DC

## 2018-12-30 ENCOUNTER — Other Ambulatory Visit: Payer: Self-pay | Admitting: Urology

## 2019-01-06 DIAGNOSIS — G4733 Obstructive sleep apnea (adult) (pediatric): Secondary | ICD-10-CM | POA: Diagnosis not present

## 2019-02-03 ENCOUNTER — Other Ambulatory Visit: Payer: Medicare Other

## 2019-02-08 ENCOUNTER — Ambulatory Visit: Payer: Medicare Other | Admitting: Urology

## 2019-02-25 ENCOUNTER — Other Ambulatory Visit: Payer: Self-pay | Admitting: Family Medicine

## 2019-02-25 DIAGNOSIS — C61 Malignant neoplasm of prostate: Secondary | ICD-10-CM

## 2019-02-28 ENCOUNTER — Other Ambulatory Visit: Payer: Self-pay

## 2019-02-28 ENCOUNTER — Other Ambulatory Visit: Payer: Medicare Other

## 2019-02-28 DIAGNOSIS — C61 Malignant neoplasm of prostate: Secondary | ICD-10-CM | POA: Diagnosis not present

## 2019-03-01 LAB — PSA: Prostate Specific Ag, Serum: 3.4 ng/mL (ref 0.0–4.0)

## 2019-03-02 NOTE — Progress Notes (Signed)
Office Visit    Patient Name: Keith Murillo Date of Encounter: 03/03/2019  Primary Care Provider:  [name], [place] Primary Cardiologist: Dr. Claiborne Billings  Chief Complaint    69 yo male with history of CAD s/p remote PCI to LAD and RCA (2004, 2009), HLD, OSA on CPAP, and prostate cancer, and who presents today to establish with the [facility] office.  Past Medical History    Past Medical History:  Diagnosis Date   Chest pain    Heart attack [facility])    Hypercholesterolemia    Hyperlipidemia    Ischemic heart disease September 2004   Known--with non-Q-wave myocardial infarction    Obesity    Sleep apnea    Past Surgical History:  Procedure Laterality Date   CARDIAC CATHETERIZATION  11/2007   TEE WITHOUT CARDIOVERSION N/A 03/16/2018   Procedure: TRANSESOPHAGEAL ECHOCARDIOGRAM (TEE);  Surgeon: Thayer Headings, MD;  Location: [facility] ENDOSCOPY;  Service: Cardiovascular;  Laterality: N/A;    Allergies  Allergies  Allergen Reactions   Ace Inhibitors     cough   Beta Adrenergic Blockers     Fatigue   Lisinopril Cough   Statins     myalgias    History of Present Illness    69 yo male with PMH as above and including a known history of CAD that was first diagnosed when he presented in 2004 with an NSTEMI. He was previously followed by Dr. Cherylann Parr but lost to follow-up since 2008. EMR records are limited but it has been documented he has had previous RCA stenting and underwent a cath in 2009 with PCI to the LAD at that time. In 03/2018, [facility] Cardiology was consulted when he presented to the hospital with enterococcus bacteremia/sepsis and reported chest pain with minimally elevated troponin consistent with supply demand ischemia. TEE without signs of vegetation/endocarditis. EF by echo 60-65%. He was discharged on ASA, BB, and statin. It was noted that he thought he may have had past issues with statins but was not certain with trial started  with Crestor 10mg  daily (now discontinued 2/2 myalgias). He reported a history of cough on ACEi with request that he keep a BP diary and bring it with him to his next office visit. He was seen via telemedicine 05/06/2018 by Dr. Claiborne Billings and had ran out of his medicines with refills provided. 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 He was bradycardic with Coreg dose decreased at restart (now discontinued 2/2 fatigue). Given his recent admission with mild Tn elevation, he completed a stress test in 07/2018. This study showed EF 56% and NRWMA. There was a small defect of mild severity noted in the mid inferolateral and apical inferior location and findings found consistent with a small area of apical inferolateral ischemia. Overall, however, it was ruled a low risk study though BP elevated as below.   Since that time, he has reportedly been doing well from a cardiac standpoint.  He denies any chest pain at rest or with exertion. He reports that, shortly after his NSTEMI, he experienced CP with exertion; however, he has not experienced this for years now. He reports improved BP with diet and exercise, as well as associated weight loss. No racing heart rate or palpitations.  No presyncope or syncope.  He also denies SOB, DOE, abdominal distention, or early satiety. He reports intermittent mild LEE or a sock line that improves with compression socks  and elevation. He denies any calf pain with walking and reports he used to get foot pain; however, this has stopped with control of his sugars. He is using his CPAP, though not every night, due to feelings of improvement. He is agreeable to start to use it again nightly, however, for risk factor modification. He continues to take ASA. No signs or symptoms of bleeding.  As above, he has stopped his Crestor 2/2 myalgias and carvedilol 2/2 fatigue and continues to report cough with ACE inhibitor/ARB.  He is on several  alternative supplements and would like to continue these in lieu of medications if possible. He started a new diet that is along the lines of a keto diet and that his entire family has been on with successful weight loss and health benefits.  He denies any exercise routine but has started to help his brother-in-law with construction again, which she reports is fairly physically demanding. He used to occasionally drink alcohol with 1-2 beers a week or a mixed drink; however, he states that with the new diet he has decreased alcohol intake as well now. He denies any history of smoking. He wishes to establish in the [place] office with Dr. Rockey Situ today.  Home Medications    Prior to Admission medications   Medication Sig Start Date End Date Taking? Authorizing Provider  aspirin 81 MG chewable tablet Chew 1 tablet (81 mg total) by mouth daily. 03/18/18   Dessa Phi, DO  carvedilol (COREG) 6.25 MG tablet Take 1 tablet (6.25 mg total) by mouth 2 (two) times daily with a meal. 05/06/18   Troy Sine, MD  Cholecalciferol (VITAMIN D-3) 125 MCG (5000 UT) TABS Take 10,000 Units by mouth daily.    [provider]  co-enzyme Q-10 30 MG capsule Take 30 mg by mouth 3 (three) times daily.    [provider]  CRANBERRY PO Take 1 capsule by mouth daily.    [provider]  Cyanocobalamin (VITAMIN B-12 PO) Take 1 tablet by mouth daily.    [provider]  DHEA 25 MG CAPS Take 25 mg by mouth daily.     [provider]  OVER THE COUNTER MEDICATION Take 1,300 mg by mouth See admin instructions. [place] grape capsules - 650 mg each - take 2 capsules by mouth daily    [provider]  OVER THE COUNTER MEDICATION Take 1 capsule by mouth daily. EDTA    [provider]  rosuvastatin (CRESTOR) 10 MG tablet Take 1 tablet (10 mg total) by mouth daily. 09/21/18 12/20/18  Troy Sine, MD  sildenafil (REVATIO) 20 MG tablet take 2 to 5 tablets by mouth 1  hour prior to intercourse 01/02/19   Zara Council A, PA-C  TURMERIC PO Take 1 capsule by mouth daily.    [provider]  vitamin C (ASCORBIC ACID) 500 MG tablet Take 500 mg by mouth daily.    [provider]    Review of Systems    He denies chest pain, palpitations, dyspnea, pnd, orthopnea, n, v, dizziness, syncope, edema, weight gain, or early satiety.  All other systems reviewed and are otherwise negative except as noted above.  Physical Exam    VS:  BP 122/80 (BP Location: Left Arm, Patient Position: Sitting, Cuff Size: Normal)    Pulse 61    Ht 5\' 7"  (1.702 m)    Wt 213 lb (96.6 kg)    SpO2 99%    BMI 33.36 kg/m  , BMI  Body mass index is 33.36 kg/m. GEN: Well nourished, well developed, in no acute distress. HEENT: normal. Neck: Supple, no JVD, carotid bruits, or masses. Cardiac: RRR, no murmurs, rubs, or gallops. No clubbing, cyanosis, edema.  Radials/DP/PT 2+ and equal bilaterally.  Respiratory:  Respirations regular and unlabored, clear to auscultation bilaterally. GI: Soft, nontender, nondistended, BS + x 4. MS: no deformity or atrophy. Skin: warm and dry, no rash. Neuro:  Strength and sensation are intact. Psych: Normal affect.  Accessory Clinical Findings    ECG personally reviewed by me today - NSR, LVH,  61bpm- no acute changes.   LABS reviewed    No CareEverywhere labs  Lab Results  Component Value Date   WBC 7.7 03/17/2018   HGB 14.3 03/17/2018   HCT 42.2 03/17/2018   MCV 85.4 03/17/2018   PLT 233 03/17/2018   Lab Results  Component Value Date   CREATININE 0.95 03/17/2018   BUN 13 03/17/2018   NA 138 03/17/2018   K 3.9 03/17/2018   CL 105 03/17/2018   CO2 23 03/17/2018   Lab Results  Component Value Date   ALT 43 03/13/2018   AST 39 03/13/2018   ALKPHOS 49 03/13/2018   BILITOT 1.0 03/13/2018   Lab Results  Component Value Date   CHOL 142 03/15/2018   HDL 33 (L) 03/15/2018   LDLCALC 62 03/15/2018   TRIG 233 (H)  03/15/2018   CHOLHDL 4.3 03/15/2018    Lab Results  Component Value Date   HGBA1C 5.9 03/26/2018   Lab Results  Component Value Date   TSH 0.833 03/13/2018    VITALS reviewed    Wt Readings from Last 3 Encounters:  03/03/19 213 lb (96.6 kg)  07/30/18 232 lb 6.4 oz (105.4 kg)  07/07/18 227 lb (103 kg)   Temp Readings from Last 3 Encounters:  03/26/18 98.1 F (36.7 C)  03/17/18 98.2 F (36.8 C) (Oral)  07/07/17 98.5 F (36.9 C) (Oral)   BP Readings from Last 3 Encounters:  03/03/19 122/80  07/30/18 (!) 166/90  05/06/18 (!) 143/71   Pulse Readings from Last 3 Encounters:  03/03/19 61  07/30/18 76  05/06/18 60   Studies / Procedures reviewed     Echo 03/13/2018 1. The left ventricle has normal systolic function with an ejection  fraction of 60-65%. The cavity size was normal. There is moderately  increased left ventricular wall thickness. Left ventricular diastolic  Doppler parameters are consistent with impaired  relaxation Indeterminent filling pressures The E/e' is 8-15. No evidence  of left ventricular regional wall motion abnormalities.  2. The right ventricle has normal systolic function. The cavity was  normal. There is no increase in right ventricular wall thickness.  3. Left atrial size was mildly dilated.  4. The mitral valve is degenerative. Mild thickening of the mitral valve  leaflet. Mild calcification of the mitral valve leaflet.  5. The aortic valve was not well visualized.  6. The ascending aorta and aortic root are normal in size and structure.  7. No evidence of left ventricular regional wall motion abnormalities.  8. The interatrial septum was not well visualized.   TEE 03/16/2018 1. The left ventricle has normal systolic function with an ejection  fraction of 60-65%. The cavity size was normal. Left ventricular diastolic  function could not be evaluated.  2. The right ventricle has normal systolic function. The cavity was  normal.  There is no increase in right ventricular wall thickness.  3. Left atrial appendage :  No evidence of thrombi.  4. Mitral valve regurgitation is mild to moderate by color flow Doppler.  No mitral valve vegetation visualized.  5. The aortic valve is tricuspid.   NM Study 07/07/2018  The left ventricular ejection fraction is normal (55-65%).  Nuclear stress EF: 56%. No wall motion abnormalities  There was no ST segment deviation noted during stress.  Defect 1: There is a small defect of mild severity present in the mid inferolateral and apical inferior location.  Findings consistent with small area of apical inferolateral ischemia.  This is an overall low risk study with small area of ischemia identified in the apical inferolateral region.  Assessment & Plan    CAD with history of PCI (2004, 2009)  --No angina or concerning sx. Euvolemic on exam. Prior PTCA by Dr. [name] and 3 stent placement in 2004 by [name] with most recent cath in 2009. Previous stress 07/2018 with small area of ischemia and overall ruled low risk as above. As below, unable to tolerate statin, BB, or ACE/ARB. Continue ASA and PRN SL nitro. Continue diet and exercise changes. He will call the office if anginal sx before RTC.  HTN, controlled with ACE/ARB/BB intolerance --BP/HR well controlled today and on no medications. Continue to monitor at home with goal BP <130/80. Intolerant to BB, ACE/ARB.  HLD with statin intolerance --Last LDL 62 with goal LDL less than 70. Stopped small dose Crestor, 2/2 myalgias. Check lipid and liver function today. Will defer referral to lipid clinic per patient preference and until results of lipids received, as he prefers to manage through diet and exercise at this time.  Mitral regurgitation, mild to moderate --Previous 03/2018 echo as above with TEE showing mild to moderate MR. No significant murmur appreciated on exam today. No SOB or s/sx concerning sx for worsening valvular dz.  We discussed updating an echo today, given it has been a year since his previous echo and to reassess the valve but with patient preference to defer until symptomatic at this time.  He is  Agreeable to call the office if any new sx develop.   OSA on CPAP --He reports that he does not use his CPAP as frequently as in the past, due to improved breathing during sleep. Recommended nightly use as untreated apnea can increase risk for abnormal heart rhythms, such as atrial fibrillation, as well as worsen heart disease. He indicated his understanding and is agreeable to attempt nightly use again. He stated that he might be able to tolerate his CPAP better, as he now uses a nose only mask, whereas before he wore the full face mask.   Medication intolerances --Escalation of GDMT limited by intolerance to BB, statin, ACE/ARB. Allergy list updated. He prefers supplements and diet/exercise changes at this time. Consider lipid clinic / pharmacy referral in the future.  BMI Elevated --Reports recent weight loss with diet and exercise. Wt today down from previous clinic weight and 213lbs. Continue lifestyle changes, including avoiding alcohol, salt, sugar, and fried foods. Mediterranean diet recommended. Regular exercise program recommended for at least 30 minutes of activity 5 days a week.  History of prostate cancer --As above in HPI. Per PCP.  _____________________  Medication changes: None. See medication intolerances above. Labs ordered: Lipid and liver function. Referral to lipid clinic if elevated LDL given statin intolerance. Studies / Imaging ordered: None. Echo discussed, given 1 year since previous and to reassess MV. He will call if s/sx of worsening valvular dz. Reassess at RTC.  Disposition: RTC 6 months.    Arvil Chaco, PA-C 03/03/2019, 8:43 AM

## 2019-03-03 ENCOUNTER — Ambulatory Visit: Payer: Medicare Other | Admitting: Urology

## 2019-03-03 ENCOUNTER — Ambulatory Visit (INDEPENDENT_AMBULATORY_CARE_PROVIDER_SITE_OTHER): Payer: Medicare Other | Admitting: Physician Assistant

## 2019-03-03 ENCOUNTER — Other Ambulatory Visit (HOSPITAL_COMMUNITY): Payer: Self-pay

## 2019-03-03 ENCOUNTER — Encounter: Payer: Self-pay | Admitting: Urology

## 2019-03-03 ENCOUNTER — Other Ambulatory Visit: Payer: Self-pay

## 2019-03-03 ENCOUNTER — Encounter: Payer: Self-pay | Admitting: Physician Assistant

## 2019-03-03 VITALS — BP 122/80 | HR 61 | Ht 67.0 in | Wt 213.0 lb

## 2019-03-03 DIAGNOSIS — E785 Hyperlipidemia, unspecified: Secondary | ICD-10-CM | POA: Diagnosis not present

## 2019-03-03 DIAGNOSIS — I2510 Atherosclerotic heart disease of native coronary artery without angina pectoris: Secondary | ICD-10-CM

## 2019-03-03 DIAGNOSIS — Z6833 Body mass index (BMI) 33.0-33.9, adult: Secondary | ICD-10-CM

## 2019-03-03 DIAGNOSIS — G4733 Obstructive sleep apnea (adult) (pediatric): Secondary | ICD-10-CM | POA: Diagnosis not present

## 2019-03-03 DIAGNOSIS — I872 Venous insufficiency (chronic) (peripheral): Secondary | ICD-10-CM

## 2019-03-03 DIAGNOSIS — Z789 Other specified health status: Secondary | ICD-10-CM

## 2019-03-03 DIAGNOSIS — C61 Malignant neoplasm of prostate: Secondary | ICD-10-CM

## 2019-03-03 DIAGNOSIS — I10 Essential (primary) hypertension: Secondary | ICD-10-CM

## 2019-03-03 DIAGNOSIS — Z8546 Personal history of malignant neoplasm of prostate: Secondary | ICD-10-CM

## 2019-03-03 DIAGNOSIS — Z9989 Dependence on other enabling machines and devices: Secondary | ICD-10-CM

## 2019-03-03 MED ORDER — TAMSULOSIN HCL 0.4 MG PO CAPS: 0.4000 mg | ORAL_CAPSULE | Freq: Every day | ORAL | 1 refills | Status: DC

## 2019-03-03 NOTE — Progress Notes (Signed)
03/03/2019 9:02 AM   Keith Murillo 02-25-1950 [id number]  Referring provider: [name], [place],  [place] [id number]  Chief Complaint  Patient presents with  . Prostate Cancer    Urologic history: 1. cT1cprostate cancer -DX 07/2017 -PSA 4.6; volume 62 g; 1/12 cores 3 +3 adenocarcinoma left lateral apex (23%) -Elected active surveillance -MRI 3/20; 57 cc volume; no suspicious high-grade lesions  2.Hypogonadism -TRT discontinued  3.Erectile dysfunction -Generic sildenafil, venous compression band   HPI: 69 y.o. male presents for semiannual follow-up.  He was hospitalized for UTI/sepsis March 2020 and we held off on confirmatory biopsy.  He overall has done well.  He recently increased his fluid intake and does note increased urgency.  He states this has been bothersome and inquired about available treatments.  No recurrent UTIs.  No dysuria.  PSA drawn 02/28/2019 3.4  PSA trend: 4/19 3.9 5/19 4.6 7/19 prostate biopsy 10/19 4.1 3/20 3.5 7/20 2.8 2/21 3.4   PMH: Past Medical History:  Diagnosis Date  . Chest pain   . Heart attack ([place])   . Hypercholesterolemia   . Hyperlipidemia   . Ischemic heart disease September 2004   Known--with non-Q-wave myocardial infarction   . Obesity   . Sleep apnea     Surgical History: Past Surgical History:  Procedure Laterality Date  . CARDIAC CATHETERIZATION  11/2007  . TEE WITHOUT CARDIOVERSION N/A 03/16/2018   Procedure: TRANSESOPHAGEAL ECHOCARDIOGRAM (TEE);  Surgeon: Acie Fredrickson Wonda Cheng, MD;  [facility] ENDOSCOPY;  Service: Cardiovascular;  Laterality: N/A;    Home Medications:  Allergies as of 03/03/2019      Reactions   Ace Inhibitors    cough   Beta Adrenergic Blockers    Fatigue   Lisinopril Cough   Statins    myalgias      Medication List       Accurate as of March 03, 2019  9:02 AM. If you have any questions, ask your nurse or doctor.        STOP taking  these medications   carvedilol 6.25 MG tablet Commonly known as: COREG Stopped by: Arvil Chaco, PA-C   rosuvastatin 10 MG tablet Commonly known as: CRESTOR Stopped by: Arvil Chaco, PA-C     TAKE these medications   aspirin 81 MG chewable tablet Chew 1 tablet (81 mg total) by mouth daily.   co-enzyme Q-10 30 MG capsule Take 30 mg by mouth 3 (three) times daily.   CRANBERRY PO Take 1 capsule by mouth daily.   DHEA 25 MG Caps Take 25 mg by mouth daily.   OVER THE COUNTER MEDICATION Take 1,300 mg by mouth See admin instructions. [place] grape capsules - 650 mg each - take 2 capsules by mouth daily   OVER THE COUNTER MEDICATION Take 1 capsule by mouth daily. EDTA   sildenafil 20 MG tablet Commonly known as: REVATIO take 2 to 5 tablets by mouth 1 hour prior to intercourse   TURMERIC PO Take 1 capsule by mouth daily.   VITAMIN B-12 PO Take 1 tablet by mouth daily.   vitamin C 500 MG tablet Commonly known as: ASCORBIC ACID Take 500 mg by mouth daily.   Vitamin D-3 125 MCG (5000 UT) Tabs Take 10,000 Units by mouth daily.       Allergies:  Allergies  Allergen Reactions  . Ace Inhibitors     cough  . Beta Adrenergic Blockers     Fatigue  .  Lisinopril Cough  . Statins     myalgias    Family History: Family History  Problem Relation Age of Onset  . Cancer Mother   . Hearing loss Father   . Cancer Maternal Grandmother   . Parkinson's disease Maternal Grandfather   . Stroke Maternal Grandfather   . Stroke Paternal Grandmother   . Heart attack Paternal Grandfather     Social History:  reports that he has never smoked. He has never used smokeless tobacco. He reports current alcohol use. He reports that he does not use drugs.   Physical Exam: BP 122/80   Pulse 61   Ht 5\' 7"  (1.702 m)   Wt 213 lb (96.6 kg)   BMI 33.36 kg/m   Constitutional:  Alert and oriented, No acute distress. HEENT: [place] AT, moist mucus membranes.  Trachea midline, no  masses. Cardiovascular: No clubbing, cyanosis, or edema. Respiratory: Normal respiratory effort, no increased work of breathing. GI: Abdomen is soft, nontender, nondistended, no abdominal masses GU: Prostate 50 g, smooth without nodules Lymph: No cervical or inguinal lymphadenopathy. Skin: No rashes, bruises or suspicious lesions. Neurologic: Grossly intact, no focal deficits, moving all 4 extremities. Psychiatric: Normal mood and affect.   Assessment & Plan:    - T1c very low risk prostate cancer Stable PSA/DRE.  He desires to continue active surveillance.  Based on PSA stability and negative MRI he requested to hold off on a confirmatory biopsy.  Follow-up 6 months PSA/DRE  - BPH with lower urinary tract symptoms Interested in an alpha-blocker trial and Rx tamsulosin sent to pharmacy  - Hypogonadism He inquired about restarting TRT.  We discussed at this time TRT is contraindicated in untreated prostate cancer.  Until treatment guidelines change will not be able to restart.   Abbie Sons, [place] [address] [place], [place] [address] [phone]

## 2019-03-03 NOTE — Patient Instructions (Signed)
Medication Instructions:  Your physician recommends that you continue on your current medications as directed. Please refer to the Current Medication list given to you today.  *If you need a refill on your cardiac medications before your next appointment, please call your pharmacy*  Lab Work: Your physician recommends that you have lab work today(Lipid, LFT)  If you have labs (blood work) drawn today and your tests are completely normal, you will receive your results only by: Marland Kitchen MyChart Message (if you have MyChart) OR . A paper copy in the mail If you have any lab test that is abnormal or we need to change your treatment, we will call you to review the results.  Testing/Procedures: None ordered   Follow-Up: At [facility], you and your health needs are our priority.  As part of our continuing mission to provide you with exceptional heart care, we have created designated Provider Care Teams.  These Care Teams include your primary Cardiologist (physician) and Advanced Practice Providers (APPs -  Physician Assistants and Nurse Practitioners) who all work together to provide you with the care you need, when you need it.  Your next appointment:   6 month(s)  The format for your next appointment:   In Person  Provider:    Please see Dr. Rockey Situ.

## 2019-03-04 LAB — HEPATIC FUNCTION PANEL
ALT: 34 IU/L (ref 0–44)
AST: 28 IU/L (ref 0–40)
Albumin: 4.2 g/dL (ref 3.8–4.8)
Alkaline Phosphatase: 81 IU/L (ref 39–117)
Bilirubin Total: 0.5 mg/dL (ref 0.0–1.2)
Bilirubin, Direct: 0.15 mg/dL (ref 0.00–0.40)
Total Protein: 6.3 g/dL (ref 6.0–8.5)

## 2019-03-04 LAB — LIPID PANEL
Chol/HDL Ratio: 2.6 ratio (ref 0.0–5.0)
Cholesterol, Total: 139 mg/dL (ref 100–199)
HDL: 53 mg/dL (ref >39)
LDL Chol Calc (NIH): 72 mg/dL (ref 0–99)
Triglycerides: 67 mg/dL (ref 0–149)
VLDL Cholesterol Cal: 14 mg/dL (ref 5–40)

## 2019-03-06 ENCOUNTER — Encounter: Payer: Self-pay | Admitting: Urology

## 2019-04-20 ENCOUNTER — Other Ambulatory Visit: Payer: Self-pay | Admitting: Urology

## 2019-06-10 ENCOUNTER — Ambulatory Visit: Payer: Self-pay | Admitting: Surgery

## 2019-06-10 DIAGNOSIS — K4090 Unilateral inguinal hernia, without obstruction or gangrene, not specified as recurrent: Secondary | ICD-10-CM | POA: Diagnosis not present

## 2019-06-10 NOTE — H&P (Signed)
[facility] Appointment: 06/10/2019 9:20 AM Location: [facility] Patient #: [id number] DOB: Oct 07, 1950 Married / Language: Keith Murillo / Race: White Male  History of Present Illness [name] A. Jewell Haught MD; 06/10/2019 9:47 AM) Patient words: Patient presents for evaluation of right inguinal hernia. He's had this for at least a year. It is getting larger causing mild discomfort. He's had significant intermittent epigastric pain after bowel movements. He was evaluated back in March 2021 with a computed tomography scan which showed a fat filled right inguinal hernia. He says it pops in and pops out. Its borders 40 standing.          Left-sided chest pain. Fever and vomiting.  EXAM: CT ABDOMEN AND PELVIS WITH CONTRAST  TECHNIQUE: Multidetector CT imaging of the abdomen and pelvis was performed using the standard protocol following bolus administration of intravenous contrast.  CONTRAST: 148mL OMNIPAQUE IOHEXOL 300 MG/ML SOLN  COMPARISON: None.  FINDINGS: Lower chest: Top normal heart size without pericardial effusion. At least three-vessel coronary arteriosclerosis is noted. Dependent bibasilar atelectasis is seen.  Hepatobiliary: Steatosis of the liver. Gallbladder is unremarkable and free of stones. No biliary dilatation. No hepatic mass is identified.  Pancreas: Normal  Spleen: Normal  Adrenals/Urinary Tract: Normal bilateral adrenal glands, kidneys and ureters without evidence of obstructive uropathy. Asymmetric eccentric thickening of the anterior right lateral wall of the urinary bladder in part due to infolding of the bladder. Focal cystitis or a subtle mucosal bladder lesion is not entirely excluded. Consider direct visual correlation. Focal inflammatory change or mucosal lesion is not entirely excluded.  Stomach/Bowel: Small hiatal hernia. Decompressed stomach. Normal small bowel rotation. Normal appearing appendix. Average stool retention  within the colon without bowel obstruction or inflammation.  Vascular/Lymphatic: Nonaneurysmal atherosclerotic aorta. No lymphadenopathy.  Reproductive: On the large prostate with central zone calcifications.  Other: Fat containing inguinal hernia. No free air nor free fluid.  Musculoskeletal: Degenerative disc disease of the lower lumbar spine. Multilevel mild facet arthropathy and hypertrophy.  IMPRESSION: 1. Hepatic steatosis. 2. Focal right-sided anterior bladder thickening in part due to infolding of the bladder. A mucosal lesion or focal cystitis are among differential possibilities. Consider direct visual correlation as deemed clinically warranted or repeat imaging with better bladder distension and opacification of the urine. 3. Coronary arteriosclerosis. 4. Fat containing right inguinal hernia.   Electronically Signed By: [name] M.D. On: 03/12/2018 19:23.  The patient is a 69 year old male.   Past Surgical History [facility] [name], [place]; 06/10/2019 9:13 AM) Anal Fissure Repair Colon Polyp Removal - Colonoscopy  Diagnostic Studies History ([name], CMA; 06/10/2019 9:13 AM) Colonoscopy 5-10 years ago  Allergies ([name], CMA; 06/10/2019 9:14 AM) ACE Inhibitors Beta Adrenergic Blockers Lisinopril & Diet Manage Prod *ANTIHYPERTENSIVES* Statins Allergies Reconciled  Medication History ([name], CMA; 06/10/2019 9:16 AM) Aspirin (81MG  Tablet, Oral) Active. Vitamin D3 (Oral) Specific strength unknown - Active. CoQ10 (30MG  Capsule, Oral) Active. Vitamin B12 (Oral) Specific strength unknown - Active. Sildenafil Citrate (20MG  Tablet, Oral) Active. Vitamin C (500MG  Tablet Chewable, Oral) Active. Medications Reconciled  Social History [name], CMA; 06/10/2019 9:13 AM) Alcohol use Occasional alcohol use. Caffeine use Carbonated beverages, Coffee, Tea. No drug use Tobacco use Never smoker.  Family History ([place], [place];  06/10/2019 9:13 AM) Diabetes Mellitus Brother.  Other Problems ([name], CMA; 06/10/2019 9:13 AM) Back Pain Bladder Problems Gastroesophageal Reflux Disease General anesthesia - complications Inguinal Hernia Myocardial infarction Prostate Cancer Sleep Apnea Vascular Disease     Review of  Systems (Chanel Nolan CMA; 06/10/2019 9:13 AM) General Not Present- Appetite Loss, Chills, Fatigue, Fever, Night Sweats, Weight Gain and Weight Loss. Skin Not Present- Change in Wart/Mole, Dryness, Hives, Jaundice, New Lesions, Non-Healing Wounds, Rash and Ulcer. HEENT Present- Ringing in the Ears. Not Present- Earache, Hearing Loss, Hoarseness, Nose Bleed, Oral Ulcers, Seasonal Allergies, Sinus Pain, Sore Throat, Visual Disturbances, Wears glasses/contact lenses and Yellow Eyes. Respiratory Not Present- Bloody sputum, Chronic Cough, Difficulty Breathing, Snoring and Wheezing. Breast Not Present- Breast Mass, Breast Pain, Nipple Discharge and Skin Changes. Cardiovascular Not Present- Chest Pain, Difficulty Breathing Lying Down, Leg Cramps, Palpitations, Rapid Heart Rate, Shortness of Breath and Swelling of Extremities. Gastrointestinal Present- Abdominal Pain. Not Present- Bloating, Bloody Stool, Change in Bowel Habits, Chronic diarrhea, Constipation, Difficulty Swallowing, Excessive gas, Gets full quickly at meals, Hemorrhoids, Indigestion, Nausea, Rectal Pain and Vomiting. Male Genitourinary Not Present- Blood in Urine, Change in Urinary Stream, Frequency, Impotence, Nocturia, Painful Urination, Urgency and Urine Leakage. Musculoskeletal Present- Back Pain. Not Present- Joint Pain, Joint Stiffness, Muscle Pain, Muscle Weakness and Swelling of Extremities. Neurological Not Present- Decreased Memory, Fainting, Headaches, Numbness, Seizures, Tingling, Tremor, Trouble walking and Weakness. Psychiatric Not Present- Anxiety, Bipolar, Change in Sleep Pattern, Depression, Fearful and Frequent  crying. Endocrine Not Present- Cold Intolerance, Excessive Hunger, Hair Changes, Heat Intolerance, Hot flashes and New Diabetes. Hematology Not Present- Blood Thinners, Easy Bruising, Excessive bleeding, Gland problems, HIV and Persistent Infections.  Vitals (Chanel Nolan CMA; 06/10/2019 9:14 AM) 06/10/2019 9:14 AM Weight: 183.13 lb Height: 67in Body Surface Area: 1.95 m Body Mass Index: 28.68 kg/m  Temp.: 97.3F  Pulse: 68 (Regular)  BP: 132/82(Sitting, Left Arm, Standard)        Physical Exam (Xylah Early A. Diannie Willner MD; 06/10/2019 9:48 AM)  General Mental Status-Alert. General Appearance-Consistent with stated age. Hydration-Well hydrated. Voice-Normal.  Head and Neck Head-normocephalic, atraumatic with no lesions or palpable masses. Trachea-midline.  Eye Eyeball - Bilateral-Extraocular movements intact. Sclera/Conjunctiva - Bilateral-No scleral icterus.  Chest and Lung Exam Chest and lung exam reveals -quiet, even and easy respiratory effort with no use of accessory muscles and on auscultation, normal breath sounds, no adventitious sounds and normal vocal resonance. Inspection Chest Wall - Normal. Back - normal.  Cardiovascular Cardiovascular examination reveals -normal heart sounds, regular rate and rhythm with no murmurs and normal pedal pulses bilaterally.  Abdomen Inspection Skin - Scar - no surgical scars. Hernias - Inguinal hernia - Right - Reducible - Right. Palpation/Percussion Palpation and Percussion of the abdomen reveal - Soft, Non Tender, No Rebound tenderness, No Rigidity (guarding) and No hepatosplenomegaly. Auscultation Auscultation of the abdomen reveals - Bowel sounds normal.  Neurologic Neurologic evaluation reveals -alert and oriented x 3 with no impairment of recent or remote memory. Mental Status-Normal.  Musculoskeletal Normal Exam - Left-Upper Extremity Strength Normal and Lower Extremity Strength  Normal. Normal Exam - Right-Upper Extremity Strength Normal, Lower Extremity Weakness.    Assessment & Plan (Lequita Meadowcroft A. Kimesha Claxton MD; 06/10/2019 9:48 AM)  RIGHT INGUINAL HERNIA (K40.90) Impression: Discussed laparoscopic and open repair with mesh.  Cons of each tachycardia discussed as well as expectations, recovery and complications. The risk of hernia repair include bleeding, infection, organ injury, bowel injury, bladder injury, nerve injury recurrent hernia, blood clots, worsening of underlying condition, chronic pain, mesh use, open surgery, death, and the need for other operations. Pt agrees to proceed   total time 30 minutes  Current Plans You are being scheduled for surgery- Our schedulers will call you.  You should hear  from our office's scheduling department within 5 working days about the location, date, and time of surgery. We try to make accommodations for patient's preferences in scheduling surgery, but sometimes the OR schedule or the surgeon's schedule prevents [place] from making those accommodations.  If you have not heard from our office [phone]) in 5 working days, call the office and ask for your surgeon's nurse.  If you have other questions about your diagnosis, plan, or surgery, call the office and ask for your surgeon's nurse.  Pt Education - Pamphlet Given - Hernia Surgery: discussed with patient and provided information. Pt Education - CCS Mesh education: discussed with patient and provided information. Pt Education - Consent for inguinal hernia - [place]: discussed with patient and provided information.

## 2019-06-16 DIAGNOSIS — Z1211 Encounter for screening for malignant neoplasm of colon: Secondary | ICD-10-CM | POA: Diagnosis not present

## 2019-06-16 DIAGNOSIS — K5900 Constipation, unspecified: Secondary | ICD-10-CM | POA: Diagnosis not present

## 2019-06-16 DIAGNOSIS — Z86010 Personal history of colonic polyps: Secondary | ICD-10-CM | POA: Diagnosis not present

## 2019-06-16 DIAGNOSIS — Z800 Family history of malignant neoplasm of digestive organs: Secondary | ICD-10-CM | POA: Diagnosis not present

## 2019-07-22 ENCOUNTER — Telehealth: Payer: Self-pay

## 2019-07-22 NOTE — Telephone Encounter (Signed)
   [facility] Pre-operative Risk Assessment    HEARTCARE STAFF: - Please ensure there is not already an duplicate clearance open for this procedure. - Under Visit Info/Reason for Call, type in Other and utilize the format Clearance MM/DD/YY or Clearance TBD. Do not use dashes or single digits. - If request is for dental extraction, please clarify the # of teeth to be extracted.  Request for surgical clearance:  1. What type of surgery is being performed? Colonoscopy   2. When is this surgery scheduled? 08/01/19  3. What type of clearance is required (medical clearance vs. Pharmacy clearance to hold med vs. Both)? both  4. Are there any medications that need to be held prior to surgery and how long? Not specified  5. Practice name and name of physician performing surgery? [facility], [place]- Edmonia James, MD  6. What is the office phone number? [phone]   7.   What is the office fax number? [phone]  8.   Anesthesia type (None, local, MAC, general) ? propofol   Keith Murillo 07/22/2019, 5:54 PM  _________________________________________________________________   (provider comments below)

## 2019-07-25 NOTE — Telephone Encounter (Signed)
Dr Rockey Situ please comment on holding aspirin prior to colonoscopy in this patient - remote PCI, low risk Myoview July 2020.  Kerin Ransom PA-C 07/25/2019 10:22 AM

## 2019-07-27 ENCOUNTER — Other Ambulatory Visit: Payer: Self-pay | Admitting: Urology

## 2019-07-28 NOTE — Telephone Encounter (Signed)
   Primary Cardiologist: Dr Rockey Situ  Chart reviewed and patient contacted by phone today as part of pre-operative protocol coverage. Given past medical history and time since last visit, based on ACC/AHA guidelines, DAVISON OHMS would be at acceptable risk for the planned procedure without further cardiovascular testing.   Dr Rockey Situ would prefer the patient remain on aspirin 81 mg for his colonoscopy.   I will route this recommendation to the requesting party via Epic fax function and remove from pre-op pool.  Please call with questions.  Kerin Ransom, PA-C 07/28/2019, 10:45 AM

## 2019-07-28 NOTE — Telephone Encounter (Signed)
I have never met this gentleman before but my impression on reading the notes would be stay on low-dose aspirin 81 mg daily and if no change in clinical status, no unstable angina symptoms, would be acceptable risk for procedure

## 2019-07-28 NOTE — Telephone Encounter (Signed)
Patient states he is returning call to discuss medication intake instructions prior to procedure.

## 2019-08-01 DIAGNOSIS — K5730 Diverticulosis of large intestine without perforation or abscess without bleeding: Secondary | ICD-10-CM | POA: Diagnosis not present

## 2019-08-01 DIAGNOSIS — Z1211 Encounter for screening for malignant neoplasm of colon: Secondary | ICD-10-CM | POA: Diagnosis not present

## 2019-08-01 DIAGNOSIS — Z86010 Personal history of colonic polyps: Secondary | ICD-10-CM | POA: Diagnosis not present

## 2019-08-03 ENCOUNTER — Other Ambulatory Visit: Payer: Self-pay

## 2019-08-03 ENCOUNTER — Ambulatory Visit: Payer: Medicare Other | Admitting: Family Medicine

## 2019-08-03 ENCOUNTER — Encounter: Payer: Self-pay | Admitting: Family Medicine

## 2019-08-03 VITALS — BP 110/70 | HR 60 | Temp 98.3°F | Ht 67.0 in | Wt 188.2 lb

## 2019-08-03 DIAGNOSIS — R0789 Other chest pain: Secondary | ICD-10-CM

## 2019-08-03 NOTE — Patient Instructions (Signed)
Start tylenol extra strength three times daily for pain. Start gentle chest wall stretches. Heat or ice.  Call if not improving in week or if new symptoms. We can evaluate with labs and on X-ray.  Call sooner if rash appears.

## 2019-08-03 NOTE — Progress Notes (Signed)
Chief Complaint  Patient presents with  . URQ Pain    History of Present Illness: HPI   69 year old male pt of Debbie Gessner's with  History of prostate cancer and CAD presents with new onset pain in RUQ abdomen. He reports he has been noting the pain off and on for 1 month. He noted worse with physical activity. No change with eating.  Pain radiates to right back. Sharp  piercing pain.  Yesterday when riding lawn mower the pain increased. No N/V/D/C.  BMs are regular.  Colonoscopy earlier in week normal.  No change in urination, no dysuria. No blood in urine No skin changes.  Has not treated pain with any med.      This visit occurred during the SARS-CoV-2 public health emergency.  Safety protocols were in place, including screening questions prior to the visit, additional usage of staff PPE, and extensive cleaning of exam room while observing appropriate contact time as indicated for disinfecting solutions.   COVID 19 screen:  No recent travel or known exposure to COVID19 The patient denies respiratory symptoms of COVID 19 at this time. The importance of social distancing was discussed today.     Review of Systems  Constitutional: Negative for chills and fever.  HENT: Negative for congestion and ear pain.   Eyes: Negative for pain and redness.  Respiratory: Negative for cough and shortness of breath.   Cardiovascular: Negative for chest pain, palpitations and leg swelling.  Gastrointestinal: Negative for abdominal pain, blood in stool, constipation, diarrhea, nausea and vomiting.  Genitourinary: Negative for dysuria.  Musculoskeletal: Negative for falls and myalgias.  Skin: Negative for rash.  Neurological: Negative for dizziness.  Psychiatric/Behavioral: Negative for depression. The patient is not nervous/anxious.       Past Medical History:  Diagnosis Date  . Chest pain   . Heart attack ([place])   . Hypercholesterolemia   . Hyperlipidemia   . Ischemic heart  disease September 2004   Known--with non-Q-wave myocardial infarction   . Obesity   . Sleep apnea     reports that he has never smoked. He has never used smokeless tobacco. He reports current alcohol use. He reports that he does not use drugs.   Current Outpatient Medications:  .  aspirin 81 MG chewable tablet, Chew 1 tablet (81 mg total) by mouth daily., Disp: 30 tablet, Rfl: 0 .  Cholecalciferol (VITAMIN D-3) 125 MCG (5000 UT) TABS, Take 10,000 Units by mouth daily., Disp: , Rfl:  .  co-enzyme Q-10 30 MG capsule, Take 30 mg by mouth 3 (three) times daily., Disp: , Rfl:  .  CRANBERRY PO, Take 1 capsule by mouth daily., Disp: , Rfl:  .  Cyanocobalamin (VITAMIN B-12 PO), Take 1 tablet by mouth daily., Disp: , Rfl:  .  DHEA 25 MG CAPS, Take 25 mg by mouth daily. , Disp: , Rfl:  .  OVER THE COUNTER MEDICATION, Take 1,300 mg by mouth See admin instructions. [place]cad[place] grape capsules - 650 mg each - take 2 capsules by mouth daily, Disp: , Rfl:  .  OVER THE COUNTER MEDICATION, Take 1 capsule by mouth daily. EDTA, Disp: , Rfl:  .  sildenafil (REVATIO) 20 MG tablet, TAKE 2 TO 5 TABLETS BY MOUTH 1 HOUR PRIOR TO INTERCOURSE, Disp: 90 tablet, Rfl: 0 .  TURMERIC PO, Take 1 capsule by mouth daily., Disp: , Rfl:  .  vitamin C (ASCORBIC ACID) 500 MG tablet, Take 500 mg by mouth daily., Disp: , Rfl:  Observations/Objective: Blood pressure 110/70, pulse 60, temperature 98.3 F (36.8 C), temperature source Temporal, height 5\' 7"  (1.702 m), weight 188 lb 4 oz (85.4 kg), SpO2 98 %.  Physical Exam Constitutional:      Appearance: He is well-developed.  HENT:     Head: Normocephalic.     Right Ear: Hearing normal.     Left Ear: Hearing normal.     Nose: Nose normal.  Neck:     Thyroid: No thyroid mass or thyromegaly.     Vascular: No carotid bruit.     Trachea: Trachea normal.  Cardiovascular:     Rate and Rhythm: Normal rate and regular rhythm.     Pulses: Normal pulses.     Heart sounds: Heart  sounds not distant. No murmur heard.  No friction rub. No gallop.      Comments: No peripheral edema Pulmonary:     Effort: Pulmonary effort is normal. No respiratory distress.     Breath sounds: Normal breath sounds.  Chest:     Chest wall: Tenderness present. No mass, lacerations, swelling or crepitus. There is no dullness to percussion.       Comments: ttp over lower lateral rib cage to palpation, no rash on skin, no hypersensitivity of skin  Abdominal:     Tenderness: There is no abdominal tenderness. There is no right CVA tenderness or left CVA tenderness.  Skin:    General: Skin is warm and dry.     Findings: No rash.  Psychiatric:        Speech: Speech normal.        Behavior: Behavior normal.        Thought Content: Thought content normal.      Assessment and Plan   Right-sided chest wall pain  Not clearly abdominal pain... not related to eating dso doubt gastritis vs gallbladder etc. Offered labs to eval.. he will hold off at this time and consider if not improving.  Most consistent with MSK pain... offered  Rib X-ray given prostate cancer history to eval for pathologic lesion.. but he will consider if not improving.  Start chest wall stretches, tylenol for pain given CAD history, ice/heat. He will call if not improving as expected.     Eliezer Lofts, MD

## 2019-08-03 NOTE — Assessment & Plan Note (Signed)
Not clearly abdominal pain... not related to eating dso doubt gastritis vs gallbladder etc. Offered labs to eval.. he will hold off at this time and consider if not improving.  Most consistent with MSK pain... offered  Rib X-ray given prostate cancer history to eval for pathologic lesion.. but he will consider if not improving.  Start chest wall stretches, tylenol for pain given CAD history, ice/heat. He will call if not improving as expected.

## 2019-08-19 ENCOUNTER — Other Ambulatory Visit (HOSPITAL_COMMUNITY): Payer: Medicare Other

## 2019-08-29 ENCOUNTER — Other Ambulatory Visit: Payer: Self-pay

## 2019-08-29 ENCOUNTER — Other Ambulatory Visit: Payer: Medicare Other

## 2019-08-29 DIAGNOSIS — C61 Malignant neoplasm of prostate: Secondary | ICD-10-CM

## 2019-08-29 NOTE — Progress Notes (Signed)
Cardiology Office Note  Date:  08/30/2019   ID:  Keith Murillo, DOB 31-Jan-1950, MRN [id number]  PCP:  [name], FNP   Chief Complaint  Patient presents with  . other    6 month follow up. "doing well." Meds reviewed by the pt. verbally.     HPI:  69 yo male with history of CAD s/p remote PCI to LAD and RCA (2004, 2009), (4 stents total) HLD, OSA on CPAP, and prostate cancer,  CAD, 2004 with an NSTEMI. , RCA stenting  cath in 2009 with PCI to the LAD Off statins "since 2010" Who presents for f/u of his CAD.  03/2018,enterococcus bacteremia/sepsis TEE without signs of vegetation/endocarditis.  EF by echo 60-65%. Crestor 10mg  daily (now discontinued 2/2 myalgias).    cough on ACEi    telemedicine 05/06/2018  Dr. Claiborne Billings and had ran out of his medicines    bradycardic with Coreg dose decreased at restart (now discontinued 2/2 fatigue)  stress test in 07/2018.EF 56% and NRWMA.  small defect of mild severity noted in the mid inferolateral and apical inferior location and findings found consistent with a small area of apical inferolateral ischemia.    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  PSH:    Past Surgical History:  Procedure Laterality Date  . CARDIAC CATHETERIZATION  11/2007  . TEE WITHOUT CARDIOVERSION N/A 03/16/2018   Procedure: TRANSESOPHAGEAL ECHOCARDIOGRAM (TEE);  Surgeon: Acie Fredrickson Wonda Cheng, MD;  [facility] ENDOSCOPY;  Service: Cardiovascular;  Laterality: N/A;    Current Outpatient Medications  Medication Sig Dispense Refill  . aspirin 81 MG chewable tablet Chew 1 tablet (81 mg total) by mouth daily. 30 tablet 0  . Cholecalciferol (VITAMIN D-3) 125 MCG (5000 UT) TABS Take 10,000 Units by mouth daily.    Marland Kitchen co-enzyme Q-10 30 MG capsule Take 30 mg by mouth 3 (three) times daily.    Marland Kitchen CRANBERRY PO Take 1 capsule by mouth daily.    . Cyanocobalamin (VITAMIN B-12 PO) Take 1 tablet by mouth daily.    Marland Kitchen DHEA 25 MG CAPS Take 25 mg by mouth daily.     Marland Kitchen OVER THE COUNTER MEDICATION Take 1,300 mg by mouth See admin instructions. Muscad[place] grape capsules - 650 mg each - take 2 capsules by mouth daily    . OVER THE COUNTER MEDICATION Take 1 capsule by mouth daily. EDTA    . sildenafil (REVATIO) 20 MG tablet TAKE 2 TO 5 TABLETS BY MOUTH 1 HOUR PRIOR TO INTERCOURSE 90 tablet 0  . TURMERIC PO Take 1 capsule by mouth daily.    . vitamin C (ASCORBIC ACID)  500 MG tablet Take 500 mg by mouth daily.     No current facility-administered medications for this visit.     Allergies:   Ace inhibitors, Beta adrenergic blockers, Lisinopril, and Statins   Social History:  The patient  reports that he has never smoked. He has never used smokeless tobacco. He reports current alcohol use. He reports that he does not use drugs.   Family History:   family history includes Cancer in his maternal grandmother and mother; Hearing loss in his father; Heart attack in his paternal grandfather; Parkinson's disease in his maternal grandfather; Stroke in his maternal grandfather and paternal grandmother.    Review of Systems: Review of Systems  Constitutional:  Negative.   HENT: Negative.   Respiratory: Negative.   Cardiovascular: Negative.   Gastrointestinal: Negative.   Musculoskeletal: Negative.   Neurological: Negative.   Psychiatric/Behavioral: Negative.   All other systems reviewed and are negative.   PHYSICAL EXAM: VS:  BP 130/72 (BP Location: Left Arm, Patient Position: Sitting, Cuff Size: Normal)   Pulse 63   Ht 5\' 7"  (1.702 m)   Wt 193 lb (87.5 kg)   SpO2 99%   BMI 30.23 kg/m  , BMI Body mass index is 30.23 kg/m. GEN: Well nourished, well developed, in no acute distress HEENT: normal Neck: no JVD, carotid bruits, or masses Cardiac: RRR; no murmurs, rubs, or gallops,no edema  Respiratory:  clear to auscultation bilaterally, normal work of breathing GI: soft, nontender, nondistended, + BS MS: no deformity or atrophy Skin: warm and dry, no rash Neuro:  Strength and sensation are intact Psych: euthymic mood, full affect  Recent Labs: 03/03/2019: ALT 34    Lipid Panel Lab Results  Component Value Date   CHOL 139 03/03/2019   HDL 53 03/03/2019   LDLCALC 72 03/03/2019   TRIG 67 03/03/2019      Wt Readings from Last 3 Encounters:  08/30/19 193 lb (87.5 kg)  08/03/19 188 lb 4 oz (85.4 kg)  03/03/19 213 lb (96.6 kg)      ASSESSMENT AND PLAN:  Problem List Items Addressed This Visit      Cardiology Problems   Hyperlipidemia    Other Visit Diagnoses    Coronary artery disease of native artery of native heart with stable angina pectoris ([place])    -  Primary   Relevant Orders   EKG 12-Lead   BMI 33.0-33.9,adult       OSA on CPAP       History of prostate cancer         Coronary artery disease with stable angina Records reviewed, discussed prior stenting 2004, 2009 He does not take his Crestor since 2010 Feels that with weight loss cholesterol will be at goal Inconsistent with aspirin, recommended he take 81 aspirin daily Blood pressure stabilized with weight loss, Previously took himself off  beta-blockers  Sleep apnea Currently not using his CPAP Reports sleep disordered breathing better after weight loss  History of prostate cancer Followed by urology, PSA discussed  Obesity Weight down tremendously following a strict diet, was purchasing food Active, does construction  Disposition:   F/U  12 months   Total encounter time more than 25 minutes  Greater than 50% was spent in counseling and coordination of care with the patient    Signed, Esmond Plants, M.D., Ph.D. [place], [place]

## 2019-08-30 ENCOUNTER — Encounter: Payer: Self-pay | Admitting: Cardiovascular Disease

## 2019-08-30 ENCOUNTER — Ambulatory Visit: Payer: Medicare Other | Admitting: Cardiovascular Disease

## 2019-08-30 ENCOUNTER — Encounter: Payer: Self-pay | Admitting: Urology

## 2019-08-30 VITALS — BP 130/72 | HR 63 | Ht 67.0 in | Wt 193.0 lb

## 2019-08-30 DIAGNOSIS — Z6833 Body mass index (BMI) 33.0-33.9, adult: Secondary | ICD-10-CM | POA: Diagnosis not present

## 2019-08-30 DIAGNOSIS — I25118 Atherosclerotic heart disease of native coronary artery with other forms of angina pectoris: Secondary | ICD-10-CM | POA: Diagnosis not present

## 2019-08-30 DIAGNOSIS — Z9989 Dependence on other enabling machines and devices: Secondary | ICD-10-CM

## 2019-08-30 DIAGNOSIS — Z8546 Personal history of malignant neoplasm of prostate: Secondary | ICD-10-CM

## 2019-08-30 DIAGNOSIS — G4733 Obstructive sleep apnea (adult) (pediatric): Secondary | ICD-10-CM | POA: Diagnosis not present

## 2019-08-30 DIAGNOSIS — E782 Mixed hyperlipidemia: Secondary | ICD-10-CM | POA: Diagnosis not present

## 2019-08-30 LAB — PSA: Prostate Specific Ag, Serum: 2.8 ng/mL (ref 0.0–4.0)

## 2019-08-30 NOTE — Patient Instructions (Addendum)
Medication Instructions:  No changes  If you need a refill on your cardiac medications before your next appointment, please call your pharmacy.    Lab work: No new labs needed   If you have labs (blood work) drawn today and your tests are completely normal, you will receive your results only by: . MyChart Message (if you have MyChart) OR . A paper copy in the mail If you have any lab test that is abnormal or we need to change your treatment, we will call you to review the results.   Testing/Procedures: No new testing needed   Follow-Up: At [facility], you and your health needs are our priority.  As part of our continuing mission to provide you with exceptional heart care, we have created designated Provider Care Teams.  These Care Teams include your primary Cardiologist (physician) and Advanced Practice Providers (APPs -  Physician Assistants and Nurse Practitioners) who all work together to provide you with the care you need, when you need it.  . You will need a follow up appointment in 12 months  . Providers on your designated Care Team:   . Christopher Berge, NP . Ryan Dunn, PA-C . Jacquelyn Visser, PA-C  Any Other Special Instructions Will Be Listed Below (If Applicable).  COVID-19 Vaccine Information can be found at: [email or web address] For questions related to vaccine distribution or appointments, please email [email or web address] or call [phone].     

## 2019-08-31 ENCOUNTER — Ambulatory Visit (INDEPENDENT_AMBULATORY_CARE_PROVIDER_SITE_OTHER): Payer: Medicare Other | Admitting: Urology

## 2019-08-31 ENCOUNTER — Encounter: Payer: Self-pay | Admitting: Urology

## 2019-08-31 ENCOUNTER — Other Ambulatory Visit: Payer: Self-pay

## 2019-08-31 VITALS — BP 129/77 | HR 60 | Ht 67.0 in | Wt 189.0 lb

## 2019-08-31 DIAGNOSIS — C61 Malignant neoplasm of prostate: Secondary | ICD-10-CM

## 2019-08-31 DIAGNOSIS — E291 Testicular hypofunction: Secondary | ICD-10-CM | POA: Diagnosis not present

## 2019-08-31 DIAGNOSIS — N5201 Erectile dysfunction due to arterial insufficiency: Secondary | ICD-10-CM | POA: Diagnosis not present

## 2019-08-31 DIAGNOSIS — N401 Enlarged prostate with lower urinary tract symptoms: Secondary | ICD-10-CM | POA: Insufficient documentation

## 2019-08-31 NOTE — Progress Notes (Signed)
08/31/2019 10:26 AM   Keith Murillo [birth year] [id number]  Referring provider: [name], [address] [place],  [place] [address]  Chief Complaint  Patient presents with   Prostate Cancer    Urologic history: 1. cT1cprostate cancer -DX 07/2017 -PSA 4.6; volume 62 g; 1/12 cores 3 +3 adenocarcinoma left lateral apex (23%) -Elected active surveillance -MRI 3/20; 57 cc volume; no suspicious high-grade lesions  2.Hypogonadism -TRT discontinued  3.Erectile dysfunction -Generic sildenafil, venous compression band  HPI: 69 y.o. male presents for semiannual follow-up.   No bothersome LUTS, occasional urge with small amount of leakage  Denies dysuria, gross hematuria  Denies flank, abdominal or pelvic pain  PSA 08/29/2019 2.8      PMH: Past Medical History:  Diagnosis Date   Chest pain    Heart attack [facility])    Hypercholesterolemia    Hyperlipidemia    Ischemic heart disease September 2004   Known--with non-Q-wave myocardial infarction    Obesity    Sleep apnea     Surgical History: Past Surgical History:  Procedure Laterality Date   CARDIAC CATHETERIZATION  11/2007   TEE WITHOUT CARDIOVERSION N/A 03/16/2018   Procedure: TRANSESOPHAGEAL ECHOCARDIOGRAM (TEE);  Surgeon: Acie Fredrickson Wonda Cheng, MD;  Location: [facility] ENDOSCOPY;  Service: Cardiovascular;  Laterality: N/A;    Home Medications:  Allergies as of 08/31/2019      Reactions   Ace Inhibitors    cough   Beta Adrenergic Blockers    Fatigue   Lisinopril Cough   Statins    myalgias      Medication List       Accurate as of August 31, 2019 10:26 AM. If you have any questions, ask your nurse or doctor.        aspirin 81 MG chewable tablet Chew 1 tablet (81 mg total) by mouth daily.   co-enzyme Q-10 30 MG capsule Take 30 mg by mouth 3 (three) times daily.   CRANBERRY PO Take 1 capsule by mouth daily.   DHEA 25 MG Caps Take 25 mg by mouth daily.   OVER THE  COUNTER MEDICATION Take 1,300 mg by mouth See admin instructions. [place] grape capsules - 650 mg each - take 2 capsules by mouth daily   OVER THE COUNTER MEDICATION Take 1 capsule by mouth daily. EDTA   sildenafil 20 MG tablet Commonly known as: REVATIO TAKE 2 TO 5 TABLETS BY MOUTH 1 HOUR PRIOR TO INTERCOURSE   TURMERIC PO Take 1 capsule by mouth daily.   VITAMIN B-12 PO Take 1 tablet by mouth daily.   vitamin C 500 MG tablet Commonly known as: ASCORBIC ACID Take 500 mg by mouth daily.   Vitamin D-3 125 MCG (5000 UT) Tabs Take 10,000 Units by mouth daily.       Allergies:  Allergies  Allergen Reactions   Ace Inhibitors     cough   Beta Adrenergic Blockers     Fatigue   Lisinopril Cough   Statins     myalgias    Family History: Family History  Problem Relation Age of Onset   Cancer Mother    Hearing loss Father    Cancer Maternal Grandmother    Parkinson's disease Maternal Grandfather    Stroke Maternal Grandfather    Stroke Paternal Grandmother    Heart attack Paternal Grandfather     Social History:  reports that he has never smoked. He has never used smokeless tobacco. He reports current alcohol use. He reports  that he does not use drugs.   Physical Exam: BP 129/77    Pulse 60    Ht 5\' 7"  (1.702 m)    Wt 189 lb (85.7 kg)    BMI 29.60 kg/m   Constitutional:  Alert and oriented, No acute distress. HEENT: [place] AT, moist mucus membranes.  Trachea midline, no masses. Cardiovascular: No clubbing, cyanosis, or edema. Respiratory: Normal respiratory effort, no increased work of breathing. GI: Abdomen is soft, nontender, nondistended, no abdominal masses GU: Prostate 50 g, smooth without nodules Skin: No rashes, bruises or suspicious lesions. Neurologic: Grossly intact, no focal deficits, moving all 4 extremities. Psychiatric: Normal mood and affect.   Assessment & Plan:    1.  T1c adenocarcinoma the prostate (NCCN very low risk)  PSA low  and stable  Desires to continue active surveillance  2.  BPH with LUTS  Symptoms not bothersome enough that he desires medical management  3.  Hypogonadism  He asked if okay to restart TRT  We discussed that testosterone is technically contraindicated with a diagnosis of prostate cancer  I informed him I would do some research on latest recommendations and let him know  4.  Erectile dysfunction  Stable   Continue semiannual follow-up with PSA   Abbie Sons, MD  [facility] [address], [place], [place] [address] [phone]

## 2019-09-16 NOTE — Progress Notes (Signed)
Your procedure is scheduled on Thursday September 16.  Report to [facility] Main Entrance "A" at 05:30 A.M., and check in at the Admitting office.  Call this number if you have problems the morning of surgery: [phone]  Call [phone] if you have any questions prior to your surgery date Monday-Friday 8am-4pm   Remember: Do not eat or drink after midnight the night before your surgery  You may drink clear liquids until 04:30 A.M. the morning of your surgery.   Clear liquids allowed are: Water, Non-Citrus Juices (without pulp), Carbonated Beverages, Clear Tea, Black Coffee Only, and Gatorade   Do NOT take any medications morning of surgery  Follow your surgeon's instructions on when to stop Aspirin.  If no instructions were given by your surgeon then you will need to call the office to get those instructions.     As of today, STOP taking any Aleve, Naproxen, Ibuprofen, Motrin, Advil, Goody's, BC's, all herbal medications, fish oil, and all vitamins that you take.    The Morning of Surgery  Do not wear jewelry  Do not wear lotions, powders, colognes, or deodorant Men may shave face and neck.  Do not bring valuables to the hospital.  [facility] is not responsible for any belongings or valuables.  If you are a smoker, DO NOT Smoke 24 hours prior to surgery  If you wear a CPAP at night please bring your mask the morning of surgery   Remember that you must have someone to transport you home after your surgery, and remain with you for 24 hours if you are discharged the same day.   Please bring cases for contacts, glasses, hearing aids, dentures or bridgework because it cannot be worn into surgery.    Leave your suitcase in the car.  After surgery it may be brought to your room.  For patients admitted to the hospital, discharge time will be determined by your treatment team.  Patients discharged the day of surgery will not be allowed to drive home.    Special  instructions:   [place]- Preparing For Surgery  Before surgery, you can play an important role. Because skin is not sterile, your skin needs to be as free of germs as possible. You can reduce the number of germs on your skin by washing with CHG (chlorahexidine gluconate) Soap before surgery.  CHG is an antiseptic cleaner which kills germs and bonds with the skin to continue killing germs even after washing.    Oral Hygiene is also important to reduce your risk of infection.  Remember - BRUSH YOUR TEETH THE MORNING OF SURGERY WITH YOUR REGULAR TOOTHPASTE  Please do not use if you have an allergy to CHG or antibacterial soaps. If your skin becomes reddened/irritated stop using the CHG.  Do not shave (including legs and underarms) for at least 48 hours prior to first CHG shower. It is OK to shave your face.  Please follow these instructions carefully.   1. Shower the NIGHT BEFORE SURGERY and the MORNING OF SURGERY with CHG Soap.   2. If you chose to wash your hair and body, wash as usual with your normal shampoo and body-wash/soap.  3. Rinse your hair and body thoroughly to remove the shampoo and soap.  4. Apply CHG directly to the skin (ONLY FROM THE NECK DOWN) and wash gently with a scrungie or a clean washcloth.   5. Do not use on open wounds or open sores. Avoid contact with your eyes, ears,  mouth and genitals (private parts). Wash Face and genitals (private parts)  with your normal soap.   6. Wash thoroughly, paying special attention to the area where your surgery will be performed.  7. Thoroughly rinse your body with warm water from the neck down.  8. DO NOT shower/wash with your normal soap after using and rinsing off the CHG Soap.  9. Pat yourself dry with a CLEAN TOWEL.  10. Wear CLEAN PAJAMAS to bed the night before surgery  11. Place CLEAN SHEETS on your bed the night of your first shower and DO NOT SLEEP WITH PETS.  12. Wear comfortable clothes the morning of surgery.      Day of Surgery:  Please shower the morning of surgery with the CHG soap Do not apply any deodorants/lotions. Please wear clean clothes to the hospital/surgery center.   Remember to brush your teeth WITH YOUR REGULAR TOOTHPASTE.   Please read over the following fact sheets that you were given.

## 2019-09-19 ENCOUNTER — Encounter (HOSPITAL_COMMUNITY): Payer: Self-pay

## 2019-09-19 ENCOUNTER — Other Ambulatory Visit: Payer: Self-pay

## 2019-09-19 ENCOUNTER — Other Ambulatory Visit (HOSPITAL_COMMUNITY)
Admission: RE | Admit: 2019-09-19 | Discharge: 2019-09-19 | Disposition: A | Discharge status: Home / Self Care | Payer: Medicare Other | Source: Ambulatory Visit | Attending: Surgery | Admitting: Surgery

## 2019-09-19 ENCOUNTER — Ambulatory Visit: Payer: Self-pay | Admitting: Surgery

## 2019-09-19 ENCOUNTER — Encounter (HOSPITAL_COMMUNITY)
Admission: RE | Admit: 2019-09-19 | Discharge: 2019-09-19 | Disposition: A | Discharge status: Home / Self Care | Payer: Medicare Other | Source: Ambulatory Visit | Attending: Surgery | Admitting: Surgery

## 2019-09-19 DIAGNOSIS — Z79899 Other long term (current) drug therapy: Secondary | ICD-10-CM | POA: Diagnosis not present

## 2019-09-19 DIAGNOSIS — I10 Essential (primary) hypertension: Secondary | ICD-10-CM | POA: Insufficient documentation

## 2019-09-19 DIAGNOSIS — Z20822 Contact with and (suspected) exposure to covid-19: Secondary | ICD-10-CM | POA: Insufficient documentation

## 2019-09-19 DIAGNOSIS — Z01812 Encounter for preprocedural laboratory examination: Secondary | ICD-10-CM | POA: Insufficient documentation

## 2019-09-19 DIAGNOSIS — G4733 Obstructive sleep apnea (adult) (pediatric): Secondary | ICD-10-CM | POA: Diagnosis not present

## 2019-09-19 DIAGNOSIS — K4090 Unilateral inguinal hernia, without obstruction or gangrene, not specified as recurrent: Secondary | ICD-10-CM | POA: Insufficient documentation

## 2019-09-19 DIAGNOSIS — E785 Hyperlipidemia, unspecified: Secondary | ICD-10-CM | POA: Insufficient documentation

## 2019-09-19 DIAGNOSIS — Z6829 Body mass index (BMI) 29.0-29.9, adult: Secondary | ICD-10-CM | POA: Insufficient documentation

## 2019-09-19 DIAGNOSIS — I2510 Atherosclerotic heart disease of native coronary artery without angina pectoris: Secondary | ICD-10-CM | POA: Insufficient documentation

## 2019-09-19 DIAGNOSIS — E7800 Pure hypercholesterolemia, unspecified: Secondary | ICD-10-CM | POA: Insufficient documentation

## 2019-09-19 DIAGNOSIS — Z7982 Long term (current) use of aspirin: Secondary | ICD-10-CM | POA: Insufficient documentation

## 2019-09-19 DIAGNOSIS — E669 Obesity, unspecified: Secondary | ICD-10-CM | POA: Diagnosis not present

## 2019-09-19 DIAGNOSIS — I259 Chronic ischemic heart disease, unspecified: Secondary | ICD-10-CM | POA: Insufficient documentation

## 2019-09-19 HISTORY — DX: Essential (primary) hypertension: I10

## 2019-09-19 HISTORY — DX: Atherosclerotic heart disease of native coronary artery without angina pectoris: I25.10

## 2019-09-19 HISTORY — DX: Malignant (primary) neoplasm, unspecified: C80.1

## 2019-09-19 HISTORY — DX: Other complications of anesthesia, initial encounter: T88.59XA

## 2019-09-19 LAB — SARS CORONAVIRUS 2 (TAT 6-24 HRS): SARS Coronavirus 2: NEGATIVE

## 2019-09-19 LAB — CBC
HCT: 44.2 % (ref 39.0–52.0)
Hemoglobin: 14.7 g/dL (ref 13.0–17.0)
MCH: 29.8 pg (ref 26.0–34.0)
MCHC: 33.3 g/dL (ref 30.0–36.0)
MCV: 89.5 fL (ref 80.0–100.0)
Platelets: 259 10*3/uL (ref 150–400)
RBC: 4.94 MIL/uL (ref 4.22–5.81)
RDW: 13 % (ref 11.5–15.5)
WBC: 6.1 10*3/uL (ref 4.0–10.5)
nRBC: 0 % (ref 0.0–0.2)

## 2019-09-19 LAB — BASIC METABOLIC PANEL
Anion gap: 11 (ref 5–15)
BUN: 20 mg/dL (ref 8–23)
CO2: 24 mmol/L (ref 22–32)
Calcium: 9.3 mg/dL (ref 8.9–10.3)
Chloride: 102 mmol/L (ref 98–111)
Creatinine, Ser: 0.77 mg/dL (ref 0.61–1.24)
GFR calc Af Amer: >60 mL/min (ref >60)
GFR calc non Af Amer: >60 mL/min (ref >60)
Glucose, Bld: 90 mg/dL (ref 70–99)
Potassium: 4.1 mmol/L (ref 3.5–5.1)
Sodium: 137 mmol/L (ref 135–145)

## 2019-09-19 NOTE — Progress Notes (Signed)
PCP:  Clarene Reamer, FNP Cardiologist: Ida Rogue, MD  EKG:  08/30/19 CXR:  N/A ECHO:  03/13/18 Stress Test:  07/07/18 Cardiac Cath: 11/2007  Covid test 09/19/19  Anesthesia Review:  Yes, cardiac history.  Stents x4  Patient denies shortness of breath, fever, cough, and chest pain at PAT appointment.  Patient verbalized understanding of instructions provided today at the PAT appointment.  Patient asked to review instructions at home and day of surgery.

## 2019-09-20 NOTE — Progress Notes (Signed)
Anesthesia Chart Review:  Case: [id number] Date/Time: 09/22/19 0714   Procedure: RIGHT INGUINAL HERNIA WITH MESH (Right ) - TAP BLOCK   Anesthesia type: General   Pre-op diagnosis: RIGHT INGUINAL HERNIA   Location: [facility] OR ROOM 02 / [place] OR   Surgeons: Keith Luna, MD      DISCUSSION: Patient is a 69 year old male scheduled for the above procedure.  History includes never smoker, CAD (Non-Q wave MI 09/23/02, PTCA mRCA and PDA 09/26/02, s/p DES pLAD and DESx2 RCA 10/26/02; DES dLAD 11/29/07), HTN, hypercholesterolemia, OSA (severe OSA with AHI of 61 04/04/17; better after weight loss, not using CPAP), prostate cancer (diagnosed 07/2017, elected active surveillance), enterococcus faecalis/Staph epidermidis bacteremia/urosepsis (03/2018).  He reported history of "fighting with waking up" from anesthesia.  Dr. Rockey Murillo last evaluated on 08/30/19. He is aware of surgery plans. Continued medical therapy for CAD/stable angina. Low risk nuclear study with small area of apical inferolateral mild ischemia in 07/2018. He denied chest pain and SOB at PAT RN interview.   Presurgical COVID-19 test negative on 09/19/2019.  Anesthesia team to evaluate date of surgery.   VS: BP (!) 142/78   Pulse 62   Temp 36.7 C (Oral)   Resp 17   Ht 5\' 7"  (1.702 m)   Wt 86.7 kg   SpO2 99%   BMI 29.93 kg/m    PROVIDERS: Keith Beck, FNP is PCP  - Keith Rogue, MD is cardiologist. Last evaluation 08/30/19. Off statins since 2010. Had cough on ACEi, Coreg discontinued due to bradycardia and fatigue. Inconsistent use of ASA. Continued medical therapy for CAD/stable angina. 12 month follow-up recommended.  Keith Giovanni, MD is urologist. Last evaluation 08/31/19. Continue semiannual follow-up.   LABS: Labs reviewed: Acceptable for surgery. Normal HFP 03/03/19.  (all labs ordered are listed, but only abnormal results are displayed)  Labs Reviewed  CBC  BASIC METABOLIC PANEL    EKG: 6/38/75: NSR   CV: Nuclear  stress test 07/07/18:  The left ventricular ejection fraction is normal (55-65%).  Nuclear stress EF: 56%. No wall motion abnormalities  There was no ST segment deviation noted during stress.  Defect 1: There is a small defect of mild severity present in the mid inferolateral and apical inferior location.  Findings consistent with small area of apical inferolateral ischemia.  This is an overall low risk study with small area of ischemia identified in the apical inferolateral region.    TEE 03/16/18: IMPRESSIONS  1. The left ventricle has normal systolic function with an ejection  fraction of 60-65%. The cavity size was normal. Left ventricular diastolic  function could not be evaluated.  2. The right ventricle has normal systolic function. The cavity was  normal. There is no increase in right ventricular wall thickness.  3. Left atrial appendage : No evidence of thrombi.  4. Mitral valve regurgitation is mild to moderate by color flow Doppler.  No mitral valve vegetation visualized.  5. The aortic valve is tricuspid.    TTE 03/13/18: IMPRESSIONS  1. The left ventricle has normal systolic function with an ejection  fraction of 60-65%. The cavity size was normal. There is moderately  increased left ventricular wall thickness. Left ventricular diastolic  Doppler parameters are consistent with impaired  relaxation Indeterminent filling pressures The E/e' is 8-15. No evidence  of left ventricular regional wall motion abnormalities.  2. The right ventricle has normal systolic function. The cavity was  normal. There is no increase in right ventricular wall thickness.  3. Left atrial size was mildly dilated.  4. The mitral valve is degenerative. Mild thickening of the mitral valve  leaflet. Mild calcification of the mitral valve leaflet.  5. The aortic valve was not well visualized.  6. The ascending aorta and aortic root are normal in size and structure.  7. No evidence of  left ventricular regional wall motion abnormalities.  8. The interatrial septum was not well visualized.    Cardiac cath 11/25/07: 1. LM: Normal. 2. LCX. 30-40% segmental narrowing proximally.  The majority of the LCX continues as a bifurcating OM vessel with diffuse irregularities but no significant focal stenosis.  The continuation of the LCX is a moderately small vessel.  It has diffuse 50-60% narrowing distally and one small branch has a 90% narrowing.  These vessels would be too small to be a candidate for PCI or bypass surgery.  3. LAD. Widely stent in its proximal segment. In the midportion of the vessel, there is a discrete area of 90% stenosis which is approximately 10 mm in length.  The distal LAD is considerably smaller than the more proximal portion at this location, so, it would be suitable for a stent placement.  LAD does have diffuse disease distally as it crosses the apex.  4. RCA. Small dominant vessel.  Patent stent proximally.  Diffuse irregularities in the RCA up to the crux. Distally, the vessels are quite small and are diffusely diseased. There was severe stenosis ( ~ 90%) and a small 1-mm acute marginal vessel, but would not be suitable for PCI or bypass grafting. -  LV angiogram.  Overall cardiac size and silhouette are normal, the global EF is estimated at 55% range.  Possible mild anterior hypokinesis.  There is no MR noted. OVERALL IMPRESSION: 1. Normal LVF with mild anterior hypokinesis. 2. Severe 3VCAD involving mainly the distal stenosis but with a severe stenosis in mid LAD that would be approachable with angioplasty and with patent stents in the proximal RCA and proximal LAD. DISCUSSION:  Keith Murillo will need to be managed medically in general, but we do have the opportunity for a stent placement in the mid left anterior descending.  At least a large part of the issue does remain distal coronary atherosclerosis. PCI 11/29/07: Stent placement in the left anterior descending  with a 23 x 2.75 mm Promus (drug-eluting stent).   Past Medical History:  Diagnosis Date  . Cancer [facility])    prostate  . Chest pain   . Complication of anesthesia    fighting with waking up  . Coronary artery disease   . Heart attack ([place])   . Hypercholesterolemia   . Hyperlipidemia   . Hypertension   . Ischemic heart disease September 2004   Known--with non-Q-wave myocardial infarction   . Obesity   . Sleep apnea     Past Surgical History:  Procedure Laterality Date  . CARDIAC CATHETERIZATION  11/2007  . TEE WITHOUT CARDIOVERSION N/A 03/16/2018   Procedure: TRANSESOPHAGEAL ECHOCARDIOGRAM (TEE);  Surgeon: Acie Fredrickson Wonda Cheng, MD;  Location: [facility] ENDOSCOPY;  Service: Cardiovascular;  Laterality: N/A;    MEDICATIONS: . aspirin EC 81 MG tablet  . Cholecalciferol (VITAMIN D-3) 125 MCG (5000 UT) TABS  . Coenzyme Q10 ([employer] COQ-10) 200 MG capsule  . CRANBERRY PO  . DHEA 25 MG CAPS  . Edetate Calcium Disodium POWD  . Magnesium Citrate POWD  . melatonin 5 MG TABS  . Menaquinone-7 (VITAMIN K2 PO)  . Methylcobalamin 2500 MCG SUBL  . Multiple Vitamin (  MULTIVITAMIN WITH MINERALS) TABS tablet  . Multiple Vitamins-Minerals (IMMUNE SUPPORT PO)  . NON FORMULARY  . OVER THE COUNTER MEDICATION  . Pregnenolone Micronized (PREGNENOLONE PO)  . Probiotic Product (PROBIOTIC PO)  . QUERCETIN PO  . sildenafil (REVATIO) 20 MG tablet  . Turmeric 500 MG TABS  . Zinc 50 MG TABS   No current facility-administered medications for this encounter.    Myra Gianotti, PA-C Surgical Short Stay/Anesthesiology [facility] Phone [phone] [facility] Phone [phone] 09/20/2019 11:17 AM

## 2019-09-20 NOTE — Anesthesia Preprocedure Evaluation (Addendum)
Anesthesia Evaluation  Patient identified by MRN, date of birth, ID band Patient awake    Reviewed: Allergy & Precautions, NPO status , Patient's Chart, lab work & pertinent test results  Airway Mallampati: II  TM Distance: >3 FB Neck ROM: Full    Dental  (+) Teeth Intact, Dental Advisory Given   Pulmonary    breath sounds clear to auscultation       Cardiovascular hypertension,  Rhythm:Regular Rate:Normal     Neuro/Psych    GI/Hepatic   Endo/Other    Renal/GU      Musculoskeletal   Abdominal   Peds  Hematology   Anesthesia Other Findings   Reproductive/Obstetrics                           Anesthesia Physical Anesthesia Plan  ASA: III  Anesthesia Plan: General   Post-op Pain Management:  Regional for Post-op pain   Induction: Intravenous  PONV Risk Score and Plan: Ondansetron and Dexamethasone  Airway Management Planned: LMA  Additional Equipment:   Intra-op Plan:   Post-operative Plan:   Informed Consent: I have reviewed the patients History and Physical, chart, labs and discussed the procedure including the risks, benefits and alternatives for the proposed anesthesia with the patient or authorized representative who has indicated his/her understanding and acceptance.     Dental advisory given  Plan Discussed with: CRNA and Anesthesiologist  Anesthesia Plan Comments: (PAT note written 09/20/2019 by Myra Gianotti, PA-C. )       Anesthesia Quick Evaluation

## 2019-09-22 ENCOUNTER — Ambulatory Visit (HOSPITAL_COMMUNITY)
Admission: RE | Admit: 2019-09-22 | Discharge: 2019-09-22 | Disposition: A | Discharge status: Home / Self Care | Payer: Medicare Other | Attending: Surgery | Admitting: Surgery

## 2019-09-22 ENCOUNTER — Encounter (HOSPITAL_COMMUNITY)
Admission: RE | Disposition: A | Discharge status: Home / Self Care | Payer: Self-pay | Source: Home / Self Care | Attending: Surgery

## 2019-09-22 ENCOUNTER — Ambulatory Visit (HOSPITAL_COMMUNITY): Payer: Medicare Other | Admitting: Emergency Medicine

## 2019-09-22 ENCOUNTER — Ambulatory Visit (HOSPITAL_COMMUNITY): Payer: Medicare Other | Admitting: Anesthesiology

## 2019-09-22 ENCOUNTER — Encounter (HOSPITAL_COMMUNITY): Payer: Self-pay | Admitting: Surgery

## 2019-09-22 ENCOUNTER — Other Ambulatory Visit: Payer: Self-pay

## 2019-09-22 DIAGNOSIS — G8929 Other chronic pain: Secondary | ICD-10-CM | POA: Diagnosis not present

## 2019-09-22 DIAGNOSIS — Z79899 Other long term (current) drug therapy: Secondary | ICD-10-CM | POA: Insufficient documentation

## 2019-09-22 DIAGNOSIS — G4730 Sleep apnea, unspecified: Secondary | ICD-10-CM | POA: Diagnosis not present

## 2019-09-22 DIAGNOSIS — Z8546 Personal history of malignant neoplasm of prostate: Secondary | ICD-10-CM | POA: Insufficient documentation

## 2019-09-22 DIAGNOSIS — G8918 Other acute postprocedural pain: Secondary | ICD-10-CM | POA: Diagnosis not present

## 2019-09-22 DIAGNOSIS — I252 Old myocardial infarction: Secondary | ICD-10-CM | POA: Diagnosis not present

## 2019-09-22 DIAGNOSIS — K4090 Unilateral inguinal hernia, without obstruction or gangrene, not specified as recurrent: Secondary | ICD-10-CM | POA: Insufficient documentation

## 2019-09-22 DIAGNOSIS — Z7982 Long term (current) use of aspirin: Secondary | ICD-10-CM | POA: Insufficient documentation

## 2019-09-22 DIAGNOSIS — I10 Essential (primary) hypertension: Secondary | ICD-10-CM | POA: Diagnosis not present

## 2019-09-22 DIAGNOSIS — Z888 Allergy status to other drugs, medicaments and biological substances status: Secondary | ICD-10-CM | POA: Insufficient documentation

## 2019-09-22 DIAGNOSIS — I2510 Atherosclerotic heart disease of native coronary artery without angina pectoris: Secondary | ICD-10-CM | POA: Diagnosis not present

## 2019-09-22 HISTORY — PX: INGUINAL HERNIA REPAIR: SHX194

## 2019-09-22 SURGERY — REPAIR, HERNIA, INGUINAL, ADULT
Anesthesia: General | Site: Groin | Laterality: Right

## 2019-09-22 MED ORDER — FENTANYL CITRATE (PF) 250 MCG/5ML IJ SOLN
INTRAMUSCULAR | Status: DC | PRN
Start: 2019-09-22 — End: 2019-09-22
  Administered 2019-09-22 (×3): 25 ug via INTRAVENOUS
  Administered 2019-09-22: 50 ug via INTRAVENOUS
  Administered 2019-09-22 (×3): 25 ug via INTRAVENOUS

## 2019-09-22 MED ORDER — DEXAMETHASONE SODIUM PHOSPHATE 10 MG/ML IJ SOLN
INTRAMUSCULAR | Status: DC | PRN
  Administered 2019-09-22: 4 mg via INTRAVENOUS

## 2019-09-22 MED ORDER — LIDOCAINE 2% (20 MG/ML) 5 ML SYRINGE
INTRAMUSCULAR | Status: DC | PRN
  Administered 2019-09-22: 50 mg via INTRAVENOUS

## 2019-09-22 MED ORDER — OXYCODONE HCL 5 MG PO TABS: 5.0000 mg | ORAL_TABLET | Freq: Once | ORAL | Status: DC | PRN

## 2019-09-22 MED ORDER — ATROPINE SULFATE 0.4 MG/ML IJ SOLN
0.4000 mg | Freq: Once | INTRAMUSCULAR | Status: AC
  Administered 2019-09-22: 0.4 mg via INTRAVENOUS
  Filled 2019-09-22: qty 1

## 2019-09-22 MED ORDER — FENTANYL CITRATE (PF) 250 MCG/5ML IJ SOLN
INTRAMUSCULAR | Status: AC
  Filled 2019-09-22: qty 5

## 2019-09-22 MED ORDER — 0.9 % SODIUM CHLORIDE (POUR BTL) OPTIME
TOPICAL | Status: DC | PRN
  Administered 2019-09-22: 1000 mL

## 2019-09-22 MED ORDER — OXYCODONE HCL 5 MG PO TABS: 5.0000 mg | ORAL_TABLET | Freq: Four times a day (QID) | ORAL | 0 refills | Status: DC | PRN

## 2019-09-22 MED ORDER — ORAL CARE MOUTH RINSE: 15.0000 mL | Freq: Once | OROMUCOSAL | Status: AC

## 2019-09-22 MED ORDER — PROPOFOL 10 MG/ML IV BOLUS
INTRAVENOUS | Status: AC
  Filled 2019-09-22: qty 20

## 2019-09-22 MED ORDER — BUPIVACAINE HCL (PF) 0.25 % IJ SOLN
INTRAMUSCULAR | Status: AC
  Filled 2019-09-22: qty 30

## 2019-09-22 MED ORDER — MIDAZOLAM HCL 5 MG/5ML IJ SOLN
INTRAMUSCULAR | Status: DC | PRN
  Administered 2019-09-22: 2 mg via INTRAVENOUS

## 2019-09-22 MED ORDER — CHLORHEXIDINE GLUCONATE CLOTH 2 % EX PADS: 6.0000 | MEDICATED_PAD | Freq: Once | CUTANEOUS | Status: DC

## 2019-09-22 MED ORDER — FENTANYL CITRATE (PF) 100 MCG/2ML IJ SOLN: 25.0000 ug | INTRAMUSCULAR | Status: DC | PRN

## 2019-09-22 MED ORDER — MIDAZOLAM HCL 2 MG/2ML IJ SOLN
INTRAMUSCULAR | Status: AC
  Filled 2019-09-22: qty 2

## 2019-09-22 MED ORDER — ONDANSETRON HCL 4 MG/2ML IJ SOLN: 4.0000 mg | Freq: Once | INTRAMUSCULAR | Status: DC | PRN

## 2019-09-22 MED ORDER — ONDANSETRON HCL 4 MG/2ML IJ SOLN
INTRAMUSCULAR | Status: AC
  Filled 2019-09-22: qty 2

## 2019-09-22 MED ORDER — PROPOFOL 10 MG/ML IV BOLUS
INTRAVENOUS | Status: DC | PRN
  Administered 2019-09-22: 170 mg via INTRAVENOUS

## 2019-09-22 MED ORDER — CHLORHEXIDINE GLUCONATE 0.12 % MT SOLN
OROMUCOSAL | Status: AC
  Administered 2019-09-22: 15 mL via OROMUCOSAL
  Filled 2019-09-22: qty 15

## 2019-09-22 MED ORDER — IBUPROFEN 800 MG PO TABS: 800.0000 mg | ORAL_TABLET | Freq: Three times a day (TID) | ORAL | 0 refills | Status: DC | PRN

## 2019-09-22 MED ORDER — CHLORHEXIDINE GLUCONATE 0.12 % MT SOLN: 15.0000 mL | Freq: Once | OROMUCOSAL | Status: AC

## 2019-09-22 MED ORDER — LACTATED RINGERS IV SOLN: INTRAVENOUS | Status: DC

## 2019-09-22 MED ORDER — OXYCODONE HCL 5 MG/5ML PO SOLN: 5.0000 mg | Freq: Once | ORAL | Status: DC | PRN

## 2019-09-22 MED ORDER — CEFAZOLIN SODIUM-DEXTROSE 2-4 GM/100ML-% IV SOLN
2.0000 g | INTRAVENOUS | Status: AC
  Administered 2019-09-22: 2 g via INTRAVENOUS
  Filled 2019-09-22: qty 100

## 2019-09-22 MED ORDER — ATROPINE SULFATE 1 MG/10ML IJ SOSY
PREFILLED_SYRINGE | INTRAMUSCULAR | Status: AC
  Filled 2019-09-22: qty 10

## 2019-09-22 MED ORDER — ONDANSETRON HCL 4 MG/2ML IJ SOLN
INTRAMUSCULAR | Status: DC | PRN
  Administered 2019-09-22: 4 mg via INTRAVENOUS

## 2019-09-22 MED ORDER — PROMETHAZINE HCL 25 MG/ML IJ SOLN
25.0000 mg | Freq: Once | INTRAMUSCULAR | Status: AC
  Administered 2019-09-22: 25 mg via INTRAVENOUS

## 2019-09-22 MED ORDER — BUPIVACAINE HCL 0.25 % IJ SOLN
INTRAMUSCULAR | Status: DC | PRN
  Administered 2019-09-22: 10 mL

## 2019-09-22 SURGICAL SUPPLY — 41 items
ADH SKN CLS APL DERMABOND .7 (GAUZE/BANDAGES/DRESSINGS) ×1
APL PRP STRL LF DISP 70% ISPRP (MISCELLANEOUS) ×1
BLADE CLIPPER SURG (BLADE) IMPLANT
CANISTER SUCT 3000ML PPV (MISCELLANEOUS) IMPLANT
CHLORAPREP W/TINT 26 (MISCELLANEOUS) ×3 IMPLANT
COVER SURGICAL LIGHT HANDLE (MISCELLANEOUS) ×3 IMPLANT
COVER WAND RF STERILE (DRAPES) ×3 IMPLANT
DERMABOND ADVANCED (GAUZE/BANDAGES/DRESSINGS) ×2
DERMABOND ADVANCED .7 DNX12 (GAUZE/BANDAGES/DRESSINGS) ×1 IMPLANT
DRAIN PENROSE 1/2X12 LTX STRL (WOUND CARE) IMPLANT
DRAPE LAPAROTOMY TRNSV 102X78 (DRAPES) ×3 IMPLANT
ELECT REM PT RETURN 9FT ADLT (ELECTROSURGICAL) ×3
ELECTRODE REM PT RTRN 9FT ADLT (ELECTROSURGICAL) ×1 IMPLANT
GLOVE BIO SURGEON STRL SZ8 (GLOVE) ×3 IMPLANT
GLOVE BIOGEL PI IND STRL 8 (GLOVE) ×1 IMPLANT
GLOVE BIOGEL PI INDICATOR 8 (GLOVE) ×2
GOWN STRL REUS W/ TWL LRG LVL3 (GOWN DISPOSABLE) ×1 IMPLANT
GOWN STRL REUS W/ TWL XL LVL3 (GOWN DISPOSABLE) ×1 IMPLANT
GOWN STRL REUS W/TWL LRG LVL3 (GOWN DISPOSABLE) ×3
GOWN STRL REUS W/TWL XL LVL3 (GOWN DISPOSABLE) ×3
KIT BASIN OR (CUSTOM PROCEDURE TRAY) ×3 IMPLANT
KIT TURNOVER KIT B (KITS) ×3 IMPLANT
MESH HERNIA SYS ULTRAPRO LRG (Mesh General) ×2 IMPLANT
NDL HYPO 25GX1X1/2 BEV (NEEDLE) ×1 IMPLANT
NEEDLE HYPO 25GX1X1/2 BEV (NEEDLE) ×3 IMPLANT
NS IRRIG 1000ML POUR BTL (IV SOLUTION) ×3 IMPLANT
PACK GENERAL/GYN (CUSTOM PROCEDURE TRAY) ×3 IMPLANT
PAD ARMBOARD 7.5X6 YLW CONV (MISCELLANEOUS) ×3 IMPLANT
PENCIL SMOKE EVACUATOR (MISCELLANEOUS) ×3 IMPLANT
SUT MNCRL AB 4-0 PS2 18 (SUTURE) ×3 IMPLANT
SUT NOVA NAB DX-16 0-1 5-0 T12 (SUTURE) ×3 IMPLANT
SUT SILK 2 0 SH (SUTURE) IMPLANT
SUT VIC AB 0 CT1 27 (SUTURE)
SUT VIC AB 0 CT1 27XBRD ANBCTR (SUTURE) IMPLANT
SUT VIC AB 2-0 SH 27 (SUTURE) ×3
SUT VIC AB 2-0 SH 27X BRD (SUTURE) ×1 IMPLANT
SUT VIC AB 3-0 SH 18 (SUTURE) ×3 IMPLANT
SUT VICRYL AB 3 0 TIES (SUTURE) ×3 IMPLANT
SYR CONTROL 10ML LL (SYRINGE) ×3 IMPLANT
TOWEL GREEN STERILE (TOWEL DISPOSABLE) ×3 IMPLANT
TOWEL GREEN STERILE FF (TOWEL DISPOSABLE) ×3 IMPLANT

## 2019-09-22 NOTE — H&P (Signed)
[facility]  Location: [facility] Patient #: [id number] DOB: [birth year] Married / Language: English / Race: White Male  History of Present Illness  Patient words: Patient presents for evaluation of right inguinal hernia. He's had this for at least a year. It is getting larger causing mild discomfort. He's had significant intermittent epigastric pain after bowel movements. He was evaluated back in March 2021 with a computed tomography scan which showed a fat filled right inguinal hernia. He says it pops in and pops out. It is more noticeable when standing. .          Left-sided chest pain. Fever and vomiting.  EXAM: CT ABDOMEN AND PELVIS WITH CONTRAST  TECHNIQUE: Multidetector CT imaging of the abdomen and pelvis was performed using the standard protocol following bolus administration of intravenous contrast.  CONTRAST: 121mL OMNIPAQUE IOHEXOL 300 MG/ML SOLN  COMPARISON: None.  FINDINGS: Lower chest: Top normal heart size without pericardial effusion. At least three-vessel coronary arteriosclerosis is noted. Dependent bibasilar atelectasis is seen.  Hepatobiliary: Steatosis of the liver. Gallbladder is unremarkable and free of stones. No biliary dilatation. No hepatic mass is identified.  Pancreas: Normal  Spleen: Normal  Adrenals/Urinary Tract: Normal bilateral adrenal glands, kidneys and ureters without evidence of obstructive uropathy. Asymmetric eccentric thickening of the anterior right lateral wall of the urinary bladder in part due to infolding of the bladder. Focal cystitis or a subtle mucosal bladder lesion is not entirely excluded. Consider direct visual correlation. Focal inflammatory change or mucosal lesion is not entirely excluded.  Stomach/Bowel: Small hiatal hernia. Decompressed stomach. Normal small bowel rotation. Normal appearing appendix. Average stool retention within the colon without bowel  obstruction or inflammation.  Vascular/Lymphatic: Nonaneurysmal atherosclerotic aorta. No lymphadenopathy.  Reproductive: On the large prostate with central zone calcifications.  Other: Fat containing inguinal hernia. No free air nor free fluid.  Musculoskeletal: Degenerative disc disease of the lower lumbar spine. Multilevel mild facet arthropathy and hypertrophy.  IMPRESSION: 1. Hepatic steatosis. 2. Focal right-sided anterior bladder thickening in part due to infolding of the bladder. A mucosal lesion or focal cystitis are among differential possibilities. Consider direct visual correlation as deemed clinically warranted or repeat imaging with better bladder distension and opacification of the urine. 3. Coronary arteriosclerosis. 4. Fat containing right inguinal hernia.   Electronically Signed By: [name] M.D. On: 03/12/2018 19:23.  The patient is a 69 year old male.   Past Surgical History Anal Fissure Repair Colon Polyp Removal - Colonoscopy  Diagnostic Studies History ) Colonoscopy 5-10 years ago  Allergies ACE Inhibitors Beta Adrenergic Blockers Lisinopril & Diet Manage Prod *ANTIHYPERTENSIVES* Statins Allergies Reconciled  Medication History ( Aspirin (81MG  Tablet, Oral) Active. Vitamin D3 (Oral) Specific strength unknown - Active. CoQ10 (30MG  Capsule, Oral) Active. Vitamin B12 (Oral) Specific strength unknown - Active. Sildenafil Citrate (20MG  Tablet, Oral) Active. Vitamin C (500MG  Tablet Chewable, Oral) Active. Medications Reconciled  Social History Alcohol use Occasional alcohol use. Caffeine use Carbonated beverages, Coffee, Tea. No drug use Tobacco use Never smoker.  Family History Diabetes Mellitus Brother.  Other Problems  Back Pain Bladder Problems Gastroesophageal Reflux Disease General anesthesia - complications Inguinal Hernia Myocardial infarction Prostate Cancer Sleep  Apnea Vascular Disease     Review of Systems  General Not Present- Appetite Loss, Chills, Fatigue, Fever, Night Sweats, Weight Gain and Weight Loss. Skin Not Present- Change in Wart/Mole, Dryness, Hives, Jaundice, New Lesions, Non-Healing Wounds, Rash and Ulcer. HEENT Present- Ringing in the Ears. Not Present- Earache, Hearing Loss, Hoarseness,  Nose Bleed, Oral Ulcers, Seasonal Allergies, Sinus Pain, Sore Throat, Visual Disturbances, Wears glasses/contact lenses and Yellow Eyes. Respiratory Not Present- Bloody sputum, Chronic Cough, Difficulty Breathing, Snoring and Wheezing. Breast Not Present- Breast Mass, Breast Pain, Nipple Discharge and Skin Changes. Cardiovascular Not Present- Chest Pain, Difficulty Breathing Lying Down, Leg Cramps, Palpitations, Rapid Heart Rate, Shortness of Breath and Swelling of Extremities. Gastrointestinal Present- Abdominal Pain. Not Present- Bloating, Bloody Stool, Change in Bowel Habits, Chronic diarrhea, Constipation, Difficulty Swallowing, Excessive gas, Gets full quickly at meals, Hemorrhoids, Indigestion, Nausea, Rectal Pain and Vomiting. Male Genitourinary Not Present- Blood in Urine, Change in Urinary Stream, Frequency, Impotence, Nocturia, Painful Urination, Urgency and Urine Leakage. Musculoskeletal Present- Back Pain. Not Present- Joint Pain, Joint Stiffness, Muscle Pain, Muscle Weakness and Swelling of Extremities. Neurological Not Present- Decreased Memory, Fainting, Headaches, Numbness, Seizures, Tingling, Tremor, Trouble walking and Weakness. Psychiatric Not Present- Anxiety, Bipolar, Change in Sleep Pattern, Depression, Fearful and Frequent crying. Endocrine Not Present- Cold Intolerance, Excessive Hunger, Hair Changes, Heat Intolerance, Hot flashes and New Diabetes. Hematology Not Present- Blood Thinners, Easy Bruising, Excessive bleeding, Gland problems, HIV and Persistent Infections.  Vitals  06/10/2019 9:14 AM Weight: 183.13 lb Height:  67in Body Surface Area: 1.95 m Body Mass Index: 28.68 kg/m  Temp.: 97.18F  Pulse: 68 (Regular)  BP: 132/82(Sitting, Left Arm, Standard)        Physical Exam  General Mental Status-Alert. General Appearance-Consistent with stated age. Hydration-Well hydrated. Voice-Normal.  Head and Neck Head-normocephalic, atraumatic with no lesions or palpable masses. Trachea-midline.  Eye Eyeball - Bilateral-Extraocular movements intact. Sclera/Conjunctiva - Bilateral-No scleral icterus.  Chest and Lung Exam Chest and lung exam reveals -quiet, even and easy respiratory effort with no use of accessory muscles and on auscultation, normal breath sounds, no adventitious sounds and normal vocal resonance. Inspection Chest Wall - Normal. Back - normal.  Cardiovascular Cardiovascular examination reveals -normal heart sounds, regular rate and rhythm with no murmurs and normal pedal pulses bilaterally.  Abdomen Inspection Skin - Scar - no surgical scars. Hernias - Inguinal hernia - Right - Reducible - Right. Palpation/Percussion Palpation and Percussion of the abdomen reveal - Soft, Non Tender, No Rebound tenderness, No Rigidity (guarding) and No hepatosplenomegaly. Auscultation Auscultation of the abdomen reveals - Bowel sounds normal.  Neurologic Neurologic evaluation reveals -alert and oriented x 3 with no impairment of recent or remote memory. Mental Status-Normal.  Musculoskeletal Normal Exam - Left-Upper Extremity Strength Normal and Lower Extremity Strength Normal. Normal Exam - Right-Upper Extremity Strength Normal, Lower Extremity Weakness.    Assessment & Plan (\ RIGHT INGUINAL HERNIA (K40.90) Impression: Discussed laparoscopic and open repair with mesh.  Pros and Cons of each  discussed as well as expectations, recovery and complications.  Pt has chosen open repair of right inguinal hernia with mesh The risk of  hernia repair include bleeding, infection, organ injury, bowel injury, bladder injury, nerve injury recurrent hernia, blood clots, worsening of underlying condition, chronic pain, mesh use, open surgery, death, and the need for other operations. Pt agrees to proceed   total time 30 minutes  Current Plans You are being scheduled for surgery- Our schedulers will call you.  You should hear from our office's scheduling department within 5 working days about the location, date, and time of surgery. We try to make accommodations for patient's preferences in scheduling surgery, but sometimes the OR schedule or the surgeon's schedule prevents [place] from making those accommodations.  If you have not heard from our office [phone]) in 5  working days, call the office and ask for your surgeon's nurse.  If you have other questions about your diagnosis, plan, or surgery, call the office and ask for your surgeon's nurse.  Pt Education - Pamphlet Given - Hernia Surgery: discussed with patient and provided information. Pt Education - CCS Mesh education: discussed with patient and provided information. Pt Education - Consent for inguinal hernia - : discussed with patient and provided information.

## 2019-09-22 NOTE — Transfer of Care (Signed)
Immediate Anesthesia Transfer of Care Note  Patient: Keith Murillo  Procedure(s) Performed: RIGHT INGUINAL HERNIA WITH MESH (Right Groin)  Patient Location: PACU  Anesthesia Type:General  Level of Consciousness: sedated  Airway & Oxygen Therapy: Patient Spontanous Breathing and Patient connected to face mask oxygen  Post-op Assessment: Report given to RN and Post -op Vital signs reviewed and stable  Post vital signs: Reviewed and stable  Last Vitals:  Vitals Value Taken Time  BP 124/67   Temp    Pulse 53 09/22/19 0852  Resp 11 09/22/19 0852  SpO2 100 % 09/22/19 0852  Vitals shown include unvalidated device data.  Last Pain:  Vitals:   09/22/19 0622  TempSrc: Tympanic  PainSc: 0-No pain         Complications: No complications documented.

## 2019-09-22 NOTE — Interval H&P Note (Signed)
History and Physical Interval Note:  09/22/2019 7:26 AM  Keith Murillo  has presented today for surgery, with the diagnosis of RIGHT INGUINAL HERNIA.  The various methods of treatment have been discussed with the patient and family. After consideration of risks, benefits and other options for treatment, the patient has consented to  Procedure(s) with comments: RIGHT INGUINAL HERNIA WITH MESH (Right) - TAP BLOCK as a surgical intervention.  The patient's history has been reviewed, patient examined, no change in status, stable for surgery.  I have reviewed the patient's chart and labs.  Questions were answered to the patient's satisfaction.     Turner Daniels MD

## 2019-09-22 NOTE — Op Note (Signed)
Right inguinal Hernia repair with ultrapro mesh , Open, Procedure Note  Indications: The patient presented with a history of a right, reducible inguinal  hernia.  He opted for open repair with mesh.  Mesh use explained to the patient. Chronic pain from its use discussed with the patient.   The risk of hernia repair include bleeding,  Infection,   Recurrence of the hernia,  Mesh use, chronic pain,  Organ injury,  Bowel injury,  Bladder injury,   nerve injury with numbness around the incision,  Death,  and worsening of preexisting  medical problems.  The alternatives to surgery have been discussed as well..  Long term expectations of both operative and non operative treatments have been discussed.   The patient agrees to proceed.  Pre-operative Diagnosis: right reducible inguinal hernia   Post-operative Diagnosis: same  Surgeon: Turner Daniels  MD   Assistants: NONE   Anesthesia: General endotracheal anesthesia and Local anesthesia 0.25.% bupivacaine  And TAP block   ASA Class: 2  Procedure Details  The patient was seen again in the Holding Room. The risks, benefits, complications, treatment options, and expected outcomes were discussed with the patient. The possibilities of reaction to medication, pulmonary aspiration, perforation of viscus, bleeding, recurrent infection, the need for additional procedures, and development of a complication requiring transfusion or further operation were discussed with the patient and/or family. There was concurrence with the proposed plan, and informed consent was obtained. The site of surgery was properly noted/marked. The patient was taken to the Operating Room, identified as Keith Murillo, and the procedure verified as hernia repair. A Time Out was held and the above information confirmed.  The patient was placed in the supine position and underwent induction of anesthesia, the lower abdomen and groin was prepped and draped in the standard fashion, and  0.25% Marcaine with epinephrine was used to anesthetize the skin over the mid-portion of the inguinal canal. A transverse incision was made. Dissection was carried through the soft tissue to expose the inguinal canal and inguinal ligament along its lower edge. The external oblique fascia was split along the course of its fibers, exposing the inguinal canal. The cord and nerve were looped using a Penrose drain and reflected out of the field. The defect was exposed and a piece of prolene hernia system ultrapro mesh was and placed over the defect. Interupted 1-0 novafil suture was then used  to repair the defect, with the suture being sewn from the pubic tubercle inferiorly and superiorly along the canal to a level just beyond the internal ring.  The ilioinguinal nerve was identified and divided as it exited the muscle. To prevent entrapment. The mesh was split to allow passage of the cord  into the canal without entrapment. The contents were then returned to canal and the external oblique fashion was then closed in a continuous fashion using 3-0 Vicryl suture taking care not to cause entrapment. Scarpa's layer closed with 3 0 vicryl and 4 0 monocryl used to close the skin.  Dermabond used for dressing.  Instrument, sponge, and needle counts were correct prior to closure and at the conclusion of the case.  Findings: Hernia as above  Estimated Blood Loss: Minimal         Drains: None         Total IV Fluids: per record         Specimens: none  Complications: None; patient tolerated the procedure well.         Disposition: PACU - hemodynamically stable.         Condition: stable

## 2019-09-22 NOTE — Anesthesia Procedure Notes (Signed)
Procedure Name: LMA Insertion Date/Time: 09/22/2019 7:33 AM Performed by: Wilburn Cornelia, CRNA Pre-anesthesia Checklist: Patient identified, Emergency Drugs available, Suction available, Patient being monitored and Timeout performed Patient Re-evaluated:Patient Re-evaluated prior to induction Oxygen Delivery Method: Circle system utilized Preoxygenation: Pre-oxygenation with 100% oxygen Induction Type: IV induction Ventilation: Mask ventilation without difficulty LMA: LMA inserted LMA Size: 5.0 Number of attempts: 1 Placement Confirmation: positive ETCO2 and breath sounds checked- equal and bilateral Tube secured with: Tape Dental Injury: Teeth and Oropharynx as per pre-operative assessment

## 2019-09-22 NOTE — Discharge Instructions (Signed)
[facility] _______[facility] ° °UMBILICAL OR INGUINAL HERNIA REPAIR: POST OP INSTRUCTIONS ° °Always review your discharge instruction sheet given to you by the facility where your surgery was performed. °IF YOU HAVE DISABILITY OR FAMILY LEAVE FORMS, YOU MUST BRING THEM TO THE OFFICE FOR PROCESSING.   °DO NOT GIVE THEM TO YOUR DOCTOR. ° °1. A  prescription for pain medication may be given to you upon discharge.  Take your pain medication as prescribed, if needed.  If narcotic pain medicine is not needed, then you may take acetaminophen (Tylenol) or ibuprofen (Advil) as needed. °2. Take your usually prescribed medications unless otherwise directed. °If you need a refill on your pain medication, please contact your pharmacy.  They will contact our office to request authorization. Prescriptions will not be filled after 5 pm or on week-ends. °3. You should follow a light diet the first 24 hours after arrival home, such as soup and crackers, etc.  Be sure to include lots of fluids daily.  Resume your normal diet the day after surgery. °4.Most patients will experience some swelling and bruising around the umbilicus or in the groin and scrotum.  Ice packs and reclining will help.  Swelling and bruising can take several days to resolve.  °6. It is common to experience some constipation if taking pain medication after surgery.  Increasing fluid intake and taking a stool softener (such as Colace) will usually help or prevent this problem from occurring.  A mild laxative (Milk of Magnesia or Miralax) should be taken according to package directions if there are no bowel movements after 48 hours. °7. Unless discharge instructions indicate otherwise, you may remove your bandages 24-48 hours after surgery, and you may shower at that time.  You may have steri-strips (small skin tapes) in place directly over the incision.  These strips should be left on the skin for 7-10 days.  If your surgeon used skin glue on the  incision, you may shower in 24 hours.  The glue will flake off over the next 2-3 weeks.  Any sutures or staples will be removed at the office during your follow-up visit. °8. ACTIVITIES:  You may resume regular (light) daily activities beginning the next day--such as daily self-care, walking, climbing stairs--gradually increasing activities as tolerated.  You may have sexual intercourse when it is comfortable.  Refrain from any heavy lifting or straining until approved by your doctor. ° °a.You may drive when you are no longer taking prescription pain medication, you can comfortably wear a seatbelt, and you can safely maneuver your car and apply brakes. °b.RETURN TO WORK:   °_____________________________________________ ° °9.You should see your doctor in the office for a follow-up appointment approximately 2-3 weeks after your surgery.  Make sure that you call for this appointment within a day or two after you arrive home to insure a convenient appointment time. °10.OTHER INSTRUCTIONS: _________________________ °   _____________________________________ ° °WHEN TO CALL YOUR DOCTOR: °1. Fever over 101.0 °2. Inability to urinate °3. Nausea and/or vomiting °4. Extreme swelling or bruising °5. Continued bleeding from incision. °6. Increased pain, redness, or drainage from the incision ° °The clinic staff is available to answer your questions during regular business hours.  Please don’t hesitate to call and ask to speak to one of the nurses for clinical concerns.  If you have a medical emergency, go to the nearest emergency room or call 911.  A surgeon from [facility] is always on call at the hospital ° ° °  [address], [address], [place], [place]  [address] ? ° [address] [address], [place], [place]   [address] °[phone] ? [phone] ? FAX [phone] °Web site: [email or web address] °

## 2019-09-22 NOTE — Anesthesia Procedure Notes (Signed)
Anesthesia Regional Block: TAP block   Pre-Anesthetic Checklist: ,, timeout performed, Correct Patient, Correct Site, Correct Laterality, Correct Procedure, Correct Position, site marked, Risks and benefits discussed, pre-op evaluation,  At surgeon's request and post-op pain management  Laterality: Right  Prep: Maximum Sterile Barrier Precautions used, chloraprep       Needles:  Injection technique: Single-shot  Needle Type: Echogenic Stimulator Needle     Needle Length: 9cm  Needle Gauge: 21     Additional Needles:   Narrative:  Start time: 09/22/2019 6:50 AM End time: 09/22/2019 7:00 AM Injection made incrementally with aspirations every 5 mL.  Performed by: Personally  Anesthesiologist: Roberts Gaudy, MD  Additional Notes: 30 cc 0.25% Bupivacaine with 1:200 epi  10 cc 1.3% Exparel injected easily

## 2019-09-23 ENCOUNTER — Encounter (HOSPITAL_COMMUNITY): Payer: Self-pay | Admitting: Surgery

## 2019-09-23 NOTE — Anesthesia Postprocedure Evaluation (Signed)
Anesthesia Post Note  Patient: Keith Murillo  Procedure(s) Performed: RIGHT INGUINAL HERNIA WITH MESH (Right Groin)     Patient location during evaluation: PACU Anesthesia Type: General Level of consciousness: awake and alert Pain management: pain level controlled Vital Signs Assessment: post-procedure vital signs reviewed and stable Respiratory status: spontaneous breathing, nonlabored ventilation, respiratory function stable and patient connected to nasal cannula oxygen Cardiovascular status: blood pressure returned to baseline and stable Postop Assessment: no apparent nausea or vomiting Anesthetic complications: no   No complications documented.  Last Vitals:  Vitals:   09/22/19 1036 09/22/19 1051  BP: 128/76 128/74  Pulse: (!) 58 (!) 56  Resp: 12 11  Temp:  36.4 C  SpO2: 98% 98%    Last Pain:  Vitals:   09/22/19 1051  TempSrc:   PainSc: Asleep                 Sabeen Piechocki COKER

## 2019-10-11 ENCOUNTER — Other Ambulatory Visit: Payer: Self-pay | Admitting: Urology

## 2019-11-16 ENCOUNTER — Encounter (HOSPITAL_COMMUNITY): Payer: Self-pay | Admitting: Surgery

## 2020-02-28 ENCOUNTER — Other Ambulatory Visit: Payer: Self-pay | Admitting: *Deleted

## 2020-02-28 DIAGNOSIS — C61 Malignant neoplasm of prostate: Secondary | ICD-10-CM

## 2020-03-02 ENCOUNTER — Other Ambulatory Visit: Payer: Self-pay

## 2020-03-02 ENCOUNTER — Other Ambulatory Visit: Payer: Medicare Other

## 2020-03-02 DIAGNOSIS — C61 Malignant neoplasm of prostate: Secondary | ICD-10-CM | POA: Diagnosis not present

## 2020-03-03 LAB — PSA: Prostate Specific Ag, Serum: 3.5 ng/mL (ref 0.0–4.0)

## 2020-03-05 ENCOUNTER — Ambulatory Visit: Payer: Self-pay | Admitting: Urology

## 2020-03-07 ENCOUNTER — Other Ambulatory Visit: Payer: Self-pay

## 2020-03-07 ENCOUNTER — Ambulatory Visit: Payer: Medicare Other | Admitting: Urology

## 2020-03-07 ENCOUNTER — Encounter: Payer: Self-pay | Admitting: Urology

## 2020-03-07 VITALS — BP 146/75 | HR 76 | Ht 67.0 in | Wt 200.0 lb

## 2020-03-07 DIAGNOSIS — E291 Testicular hypofunction: Secondary | ICD-10-CM | POA: Diagnosis not present

## 2020-03-07 DIAGNOSIS — N5201 Erectile dysfunction due to arterial insufficiency: Secondary | ICD-10-CM

## 2020-03-07 DIAGNOSIS — C61 Malignant neoplasm of prostate: Secondary | ICD-10-CM

## 2020-03-07 DIAGNOSIS — N401 Enlarged prostate with lower urinary tract symptoms: Secondary | ICD-10-CM | POA: Diagnosis not present

## 2020-03-07 NOTE — Progress Notes (Signed)
03/07/2020 8:36 AM   Keith Murillo 1950/02/10 [id number]  Referring provider: [name], FNP No address on file  Chief Complaint  Patient presents with  . Prostate Cancer    Urologic history: 1. cT1cprostate cancer -DX 07/2017 -PSA 4.6; volume 62 g; 1/12 cores 3 +3 adenocarcinoma left lateral apex (23%) -Elected active surveillance -MRI 3/20; 57 cc volume; no suspicious high-grade lesions  2.Hypogonadism -TRT discontinued  3.Erectile dysfunction -Generic sildenafil, venous compression band   HPI: 70 y.o. male presents for 56-month follow-up.   States urgency symptoms are improved since last visit  Nocturia x0-1  Denies dysuria, gross hematuria  Denies flank, abdominal or pelvic pain  Taking sildenafil daily  PSA 03/02/2020 stable at 3.5    PMH: Past Medical History:  Diagnosis Date  . Cancer [facility])    prostate  . Chest pain   . Complication of anesthesia    fighting with waking up  . Coronary artery disease   . Heart attack ([place])   . Hypercholesterolemia   . Hyperlipidemia   . Hypertension   . Ischemic heart disease September 2004   Known--with non-Q-wave myocardial infarction   . Obesity   . Sleep apnea     Surgical History: Past Surgical History:  Procedure Laterality Date  . CARDIAC CATHETERIZATION  11/2007  . INGUINAL HERNIA REPAIR Right 09/22/2019   Procedure: RIGHT INGUINAL HERNIA WITH MESH;  Surgeon: Erroll Luna, MD;  Location: [place];  Service: General;  Laterality: Right;  TAP BLOCK  . TEE WITHOUT CARDIOVERSION N/A 03/16/2018   Procedure: TRANSESOPHAGEAL ECHOCARDIOGRAM (TEE);  Surgeon: Acie Fredrickson Wonda Cheng, MD;  Location: [facility] ENDOSCOPY;  Service: Cardiovascular;  Laterality: N/A;    Home Medications:  Allergies as of 03/07/2020      Reactions   Ace Inhibitors    cough   Beta Adrenergic Blockers    Fatigue   Codeine    Other reaction(s): Unknown   Lisinopril Cough   Statins    myalgias      Medication List        Accurate as of March 07, 2020  8:36 AM. If you have any questions, ask your nurse or doctor.        STOP taking these medications   oxyCODONE 5 MG immediate release tablet Commonly known as: Oxy IR/ROXICODONE Stopped by: Abbie Sons, MD     TAKE these medications   aspirin EC 81 MG tablet Take 81 mg by mouth daily. Swallow whole.   CRANBERRY PO Take 1 capsule by mouth daily.   [employer] CoQ-10 200 MG capsule Generic drug: Coenzyme Q10 Take 200 mg by mouth daily.   DHEA 25 MG Caps Take 25 mg by mouth daily.   Edetate Calcium Disodium Powd Take 600 mg by mouth daily. Calcium Disodium EDTA   ibuprofen 800 MG tablet Commonly known as: ADVIL Take 1 tablet (800 mg total) by mouth every 8 (eight) hours as needed.   IMMUNE SUPPORT PO Take 520 mg by mouth daily. Vitac-ld Liposomal Vitamin   Magnesium Citrate Powd Take 1 Scoop by mouth daily as needed (regularity).   melatonin 5 MG Tabs Take 5 mg by mouth at bedtime.   Methylcobalamin 2500 MCG Subl Take 5,000 mcg by mouth daily.   multivitamin with minerals Tabs tablet Take 1 tablet by mouth daily. Men's Multivitamin by Purity   NON FORMULARY Take 1 capsule by mouth daily. HAIOTB Circulation Syn3rgy (Beet Root, L-Arginine, Horse Chestnut)   OVER THE COUNTER MEDICATION Take 2  capsules by mouth daily. Muscadine grape capsules - 650 mg each - take 2 capsules by mouth daily   PREGNENOLONE PO Take 75 mg by mouth daily.   PROBIOTIC PO Take 1 capsule by mouth daily. 20 billion   QUERCETIN PO Take 1 capsule by mouth daily. Bio-Quercetin   sildenafil 20 MG tablet Commonly known as: REVATIO TAKE 2 TO 5 TABLETS BY MOUTH 1 HOUR PRIOR TO INTERCOURSE   Turmeric 500 MG Tabs Take 500 mg by mouth daily.   Vitamin D-3 125 MCG (5000 UT) Tabs Take 5,000 Units by mouth daily.   VITAMIN K2 PO Take 150 mcg by mouth daily. MK-7   Zinc 50 MG Tabs Take 50 mg by mouth daily.       Allergies:  Allergies  Allergen  Reactions  . Ace Inhibitors     cough  . Beta Adrenergic Blockers     Fatigue  . Codeine     Other reaction(s): Unknown  . Lisinopril Cough  . Statins     myalgias    Family History: Family History  Problem Relation Age of Onset  . Cancer Mother   . Hearing loss Father   . Cancer Maternal Grandmother   . Parkinson's disease Maternal Grandfather   . Stroke Maternal Grandfather   . Stroke Paternal Grandmother   . Heart attack Paternal Grandfather     Social History:  reports that he has never smoked. He has never used smokeless tobacco. He reports current alcohol use. He reports that he does not use drugs.   Physical Exam: BP (!) 146/75   Pulse 76   Ht 5\' 7"  (1.702 m)   Wt 200 lb (90.7 kg)   BMI 31.32 kg/m   Constitutional:  Alert and oriented, No acute distress. HEENT: [place] AT, moist mucus membranes.  Trachea midline, no masses. Cardiovascular: No clubbing, cyanosis, or edema. Respiratory: Normal respiratory effort, no increased work of breathing. GU: Prostate 50 g, smooth without nodules Skin: No rashes, bruises or suspicious lesions. Neurologic: Grossly intact, no focal deficits, moving all 4 extremities. Psychiatric: Normal mood and affect.    Assessment & Plan:    1.  T1c adenocarcinoma prostate (very low risk)  Benign DRE  Stable PSA  Desires to continue active surveillance  35-month follow-up for PSA/DRE  2.  BPH with LUTS  Stable  3.  Hypogonadism  Patient brought in an article from Dr. Claudette Stapler on TRT in men with prostate cancer  Will review   4.  Erectile dysfunction  Stable on sildenafil   Abbie Sons, MD  [place] [address], [place], [place] [address] [phone]

## 2020-03-27 ENCOUNTER — Other Ambulatory Visit: Payer: Self-pay | Admitting: Urology

## 2020-09-07 ENCOUNTER — Other Ambulatory Visit: Payer: Medicare Other

## 2020-09-07 ENCOUNTER — Other Ambulatory Visit: Payer: Self-pay

## 2020-09-07 DIAGNOSIS — C61 Malignant neoplasm of prostate: Secondary | ICD-10-CM | POA: Diagnosis not present

## 2020-09-07 DIAGNOSIS — E291 Testicular hypofunction: Secondary | ICD-10-CM | POA: Diagnosis not present

## 2020-09-07 DIAGNOSIS — N401 Enlarged prostate with lower urinary tract symptoms: Secondary | ICD-10-CM | POA: Diagnosis not present

## 2020-09-08 LAB — TESTOSTERONE: Testosterone: 390 ng/dL (ref 264–916)

## 2020-09-08 LAB — PSA: Prostate Specific Ag, Serum: 3.1 ng/mL (ref 0.0–4.0)

## 2020-09-12 ENCOUNTER — Encounter: Payer: Self-pay | Admitting: Urology

## 2020-09-12 ENCOUNTER — Other Ambulatory Visit: Payer: Self-pay

## 2020-09-12 ENCOUNTER — Ambulatory Visit: Payer: Medicare Other | Admitting: Urology

## 2020-09-12 VITALS — BP 168/81 | HR 71 | Ht 67.0 in | Wt 205.0 lb

## 2020-09-12 DIAGNOSIS — E291 Testicular hypofunction: Secondary | ICD-10-CM | POA: Diagnosis not present

## 2020-09-12 DIAGNOSIS — N5201 Erectile dysfunction due to arterial insufficiency: Secondary | ICD-10-CM | POA: Diagnosis not present

## 2020-09-12 DIAGNOSIS — C61 Malignant neoplasm of prostate: Secondary | ICD-10-CM | POA: Diagnosis not present

## 2020-09-12 MED ORDER — SILDENAFIL CITRATE 20 MG PO TABS: ORAL_TABLET | ORAL | 0 refills | Status: DC

## 2020-09-12 NOTE — Progress Notes (Signed)
09/12/2020 1:04 PM   Keith Murillo January 23, 1950 [id number]  Referring provider: No referring provider defined for this encounter.  Chief Complaint  Patient presents with   Prostate Cancer   Urologic history: 1. cT1c prostate cancer -DX 07/2017 -PSA 4.6; volume 62 g; 1/12 cores 3 +3 adenocarcinoma left lateral apex (23%) -Elected active surveillance -MRI 3/20; 57 cc volume; no suspicious high-grade lesions   2.  Hypogonadism -TRT discontinued   3.  Erectile dysfunction -Generic sildenafil, venous compression band   HPI: 70 y.o. male presents for prostate cancer follow-up.  Doing well since last visit No bothersome LUTS Denies dysuria, gross hematuria Denies flank, abdominal or pelvic pain Labs 09/07/2020: PSA 3.1; testosterone 390 Feels his ED has improved with use of sildenafil and the venous compression band He is also taking an OTC natural supplement to boost testosterone   PMH: Past Medical History:  Diagnosis Date   Cancer [facility])    prostate   Chest pain    Complication of anesthesia    fighting with waking up   Coronary artery disease    Heart attack [facility])    Hypercholesterolemia    Hyperlipidemia    Hypertension    Ischemic heart disease September 2004   Known--with non-Q-wave myocardial infarction    Obesity    Sleep apnea     Surgical History: Past Surgical History:  Procedure Laterality Date   CARDIAC CATHETERIZATION  11/2007   INGUINAL HERNIA REPAIR Right 09/22/2019   Procedure: RIGHT INGUINAL HERNIA WITH MESH;  Surgeon: Erroll Luna, MD;  Location: [place];  Service: General;  Laterality: Right;  TAP BLOCK   TEE WITHOUT CARDIOVERSION N/A 03/16/2018   Procedure: TRANSESOPHAGEAL ECHOCARDIOGRAM (TEE);  Surgeon: Acie Fredrickson Wonda Cheng, MD;  Location: [facility] ENDOSCOPY;  Service: Cardiovascular;  Laterality: N/A;    Home Medications:  Allergies as of 09/12/2020       Reactions   Ace Inhibitors    cough   Beta Adrenergic Blockers    Fatigue    Codeine    Other reaction(s): Unknown   Lisinopril Cough   Statins    myalgias        Medication List        Accurate as of September 12, 2020  1:04 PM. If you have any questions, ask your nurse or doctor.          aspirin EC 81 MG tablet Take 81 mg by mouth daily. Swallow whole.   CRANBERRY PO Take 1 capsule by mouth daily.   CVS CoQ-10 200 MG capsule Generic drug: Coenzyme Q10 Take 200 mg by mouth daily.   DHEA 25 MG Caps Take 25 mg by mouth daily.   Edetate Calcium Disodium Powd Take 600 mg by mouth daily. Calcium Disodium EDTA   ibuprofen 800 MG tablet Commonly known as: ADVIL Take 1 tablet (800 mg total) by mouth every 8 (eight) hours as needed.   IMMUNE SUPPORT PO Take 520 mg by mouth daily. Vitac-ld Liposomal Vitamin   Magnesium Citrate Powd Take 1 Scoop by mouth daily as needed (regularity).   melatonin 5 MG Tabs Take 5 mg by mouth at bedtime.   Methylcobalamin 2500 MCG Subl Take 5,000 mcg by mouth daily.   multivitamin with minerals Tabs tablet Take 1 tablet by mouth daily. Men's Multivitamin by Purity   NON FORMULARY Take 1 capsule by mouth daily. HAIOTB Circulation Syn3rgy (Beet Root, L-Arginine, Horse Chestnut)   OVER THE COUNTER MEDICATION Take 2 capsules by mouth daily. Muscadine grape  capsules - 650 mg each - take 2 capsules by mouth daily   PREGNENOLONE PO Take 75 mg by mouth daily.   PROBIOTIC PO Take 1 capsule by mouth daily. 20 billion   QUERCETIN PO Take 1 capsule by mouth daily. Bio-Quercetin   sildenafil 20 MG tablet Commonly known as: REVATIO take 2 to 5 tablets by mouth 1 hour prior to intercourse   Turmeric 500 MG Tabs Take 500 mg by mouth daily.   Vitamin D-3 125 MCG (5000 UT) Tabs Take 5,000 Units by mouth daily.   VITAMIN K2 PO Take 150 mcg by mouth daily. MK-7   Zinc 50 MG Tabs Take 50 mg by mouth daily.        Allergies:  Allergies  Allergen Reactions   Ace Inhibitors     cough   Beta Adrenergic  Blockers     Fatigue   Codeine     Other reaction(s): Unknown   Lisinopril Cough   Statins     myalgias    Family History: Family History  Problem Relation Age of Onset   Cancer Mother    Hearing loss Father    Cancer Maternal Grandmother    Parkinson's disease Maternal Grandfather    Stroke Maternal Grandfather    Stroke Paternal Grandmother    Heart attack Paternal Grandfather     Social History:  reports that he has never smoked. He has never used smokeless tobacco. He reports current alcohol use. He reports that he does not use drugs.   Physical Exam: BP (!) 168/81   Pulse 71   Ht '5\' 7"'$  (1.702 m)   Wt 205 lb (93 kg)   BMI 32.11 kg/m   Constitutional:  Alert and oriented, No acute distress. HEENT: [place] AT, moist mucus membranes.  Trachea midline, no masses. Cardiovascular: No clubbing, cyanosis, or edema. Respiratory: Normal respiratory effort, no increased work of breathing. GI: Abdomen is soft, nontender, nondistended, no abdominal masses Neurologic: Grossly intact, no focal deficits, moving all 4 extremities. Psychiatric: Normal mood and affect.   Assessment & Plan:    1.  T1c prostate cancer Stable PSA Follow-up 6 months for PSA/DRE  2.  Erectile dysfunction Improved Sildenafil refilled   Abbie Sons, MD  [place] [address] [place], [place] [address] [phone]

## 2020-11-25 ENCOUNTER — Other Ambulatory Visit: Payer: Self-pay | Admitting: Urology

## 2020-12-18 ENCOUNTER — Encounter: Payer: Self-pay | Admitting: Urology

## 2021-01-04 ENCOUNTER — Emergency Department (HOSPITAL_BASED_OUTPATIENT_CLINIC_OR_DEPARTMENT_OTHER)
Admission: EM | Admit: 2021-01-04 | Discharge: 2021-01-04 | Disposition: A | Discharge status: Home / Self Care | Payer: Medicare Other | Attending: Emergency Medicine | Admitting: Emergency Medicine

## 2021-01-04 ENCOUNTER — Emergency Department (HOSPITAL_BASED_OUTPATIENT_CLINIC_OR_DEPARTMENT_OTHER): Payer: Medicare Other

## 2021-01-04 ENCOUNTER — Encounter (HOSPITAL_BASED_OUTPATIENT_CLINIC_OR_DEPARTMENT_OTHER): Payer: Self-pay | Admitting: Emergency Medicine

## 2021-01-04 ENCOUNTER — Emergency Department (HOSPITAL_BASED_OUTPATIENT_CLINIC_OR_DEPARTMENT_OTHER): Payer: Medicare Other | Admitting: Radiology

## 2021-01-04 ENCOUNTER — Other Ambulatory Visit: Payer: Self-pay

## 2021-01-04 DIAGNOSIS — N400 Enlarged prostate without lower urinary tract symptoms: Secondary | ICD-10-CM | POA: Diagnosis not present

## 2021-01-04 DIAGNOSIS — R918 Other nonspecific abnormal finding of lung field: Secondary | ICD-10-CM | POA: Diagnosis not present

## 2021-01-04 DIAGNOSIS — I10 Essential (primary) hypertension: Secondary | ICD-10-CM | POA: Insufficient documentation

## 2021-01-04 DIAGNOSIS — J101 Influenza due to other identified influenza virus with other respiratory manifestations: Secondary | ICD-10-CM

## 2021-01-04 DIAGNOSIS — R059 Cough, unspecified: Secondary | ICD-10-CM | POA: Diagnosis not present

## 2021-01-04 DIAGNOSIS — Z7982 Long term (current) use of aspirin: Secondary | ICD-10-CM | POA: Insufficient documentation

## 2021-01-04 DIAGNOSIS — R0902 Hypoxemia: Secondary | ICD-10-CM | POA: Diagnosis not present

## 2021-01-04 DIAGNOSIS — Z20822 Contact with and (suspected) exposure to covid-19: Secondary | ICD-10-CM | POA: Diagnosis not present

## 2021-01-04 DIAGNOSIS — R509 Fever, unspecified: Secondary | ICD-10-CM | POA: Diagnosis not present

## 2021-01-04 DIAGNOSIS — R110 Nausea: Secondary | ICD-10-CM | POA: Diagnosis not present

## 2021-01-04 DIAGNOSIS — J189 Pneumonia, unspecified organism: Secondary | ICD-10-CM

## 2021-01-04 DIAGNOSIS — R197 Diarrhea, unspecified: Secondary | ICD-10-CM | POA: Diagnosis not present

## 2021-01-04 DIAGNOSIS — I2510 Atherosclerotic heart disease of native coronary artery without angina pectoris: Secondary | ICD-10-CM | POA: Insufficient documentation

## 2021-01-04 DIAGNOSIS — J989 Respiratory disorder, unspecified: Secondary | ICD-10-CM | POA: Diagnosis not present

## 2021-01-04 LAB — RESP PANEL BY RT-PCR (FLU A&B, COVID) ARPGX2
Influenza A by PCR: POSITIVE — AB
Influenza B by PCR: NEGATIVE
SARS Coronavirus 2 by RT PCR: NEGATIVE

## 2021-01-04 LAB — URINALYSIS, ROUTINE W REFLEX MICROSCOPIC
Bilirubin Urine: NEGATIVE
Glucose, UA: NEGATIVE mg/dL
Ketones, ur: NEGATIVE mg/dL
Leukocytes,Ua: NEGATIVE
Nitrite: NEGATIVE
Protein, ur: 100 mg/dL — AB
Specific Gravity, Urine: 1.023 (ref 1.005–1.030)
pH: 6 (ref 5.0–8.0)

## 2021-01-04 LAB — COMPREHENSIVE METABOLIC PANEL
ALT: 17 U/L (ref 0–44)
AST: 31 U/L (ref 15–41)
Albumin: 4.1 g/dL (ref 3.5–5.0)
Alkaline Phosphatase: 45 U/L (ref 38–126)
Anion gap: 12 (ref 5–15)
BUN: 16 mg/dL (ref 8–23)
CO2: 24 mmol/L (ref 22–32)
Calcium: 8.5 mg/dL — ABNORMAL LOW (ref 8.9–10.3)
Chloride: 96 mmol/L — ABNORMAL LOW (ref 98–111)
Creatinine, Ser: 1.03 mg/dL (ref 0.61–1.24)
GFR, Estimated: >60 mL/min (ref >60)
Glucose, Bld: 122 mg/dL — ABNORMAL HIGH (ref 70–99)
Potassium: 3.4 mmol/L — ABNORMAL LOW (ref 3.5–5.1)
Sodium: 132 mmol/L — ABNORMAL LOW (ref 135–145)
Total Bilirubin: 0.7 mg/dL (ref 0.3–1.2)
Total Protein: 7 g/dL (ref 6.5–8.1)

## 2021-01-04 LAB — CBC WITH DIFFERENTIAL/PLATELET
Abs Immature Granulocytes: 0.04 10*3/uL (ref 0.00–0.07)
Basophils Absolute: 0 10*3/uL (ref 0.0–0.1)
Basophils Relative: 0 %
Eosinophils Absolute: 0 10*3/uL (ref 0.0–0.5)
Eosinophils Relative: 0 %
HCT: 47.7 % (ref 39.0–52.0)
Hemoglobin: 16.3 g/dL (ref 13.0–17.0)
Immature Granulocytes: 0 %
Lymphocytes Relative: 6 %
Lymphs Abs: 0.7 10*3/uL (ref 0.7–4.0)
MCH: 29.1 pg (ref 26.0–34.0)
MCHC: 34.2 g/dL (ref 30.0–36.0)
MCV: 85 fL (ref 80.0–100.0)
Monocytes Absolute: 1.6 10*3/uL — ABNORMAL HIGH (ref 0.1–1.0)
Monocytes Relative: 14 %
Neutro Abs: 9.3 10*3/uL — ABNORMAL HIGH (ref 1.7–7.7)
Neutrophils Relative %: 80 %
Platelets: 193 10*3/uL (ref 150–400)
RBC: 5.61 MIL/uL (ref 4.22–5.81)
RDW: 13.3 % (ref 11.5–15.5)
WBC: 11.7 10*3/uL — ABNORMAL HIGH (ref 4.0–10.5)
nRBC: 0 % (ref 0.0–0.2)

## 2021-01-04 LAB — TROPONIN I (HIGH SENSITIVITY)
Troponin I (High Sensitivity): 73 ng/L — ABNORMAL HIGH (ref <18)
Troponin I (High Sensitivity): 76 ng/L — ABNORMAL HIGH (ref <18)

## 2021-01-04 LAB — LACTIC ACID, PLASMA
Lactic Acid, Venous: 2 mmol/L (ref 0.5–1.9)
Lactic Acid, Venous: 2.3 mmol/L (ref 0.5–1.9)
Lactic Acid, Venous: 1.6 mmol/L (ref 0.5–1.9)

## 2021-01-04 MED ORDER — LOPERAMIDE HCL 2 MG PO TABS: 2.0000 mg | ORAL_TABLET | Freq: Four times a day (QID) | ORAL | 0 refills | Status: DC | PRN

## 2021-01-04 MED ORDER — ACETAMINOPHEN 325 MG PO TABS
650.0000 mg | ORAL_TABLET | Freq: Once | ORAL | Status: AC | PRN
  Administered 2021-01-04: 15:00:00 650 mg via ORAL
  Filled 2021-01-04: qty 2

## 2021-01-04 MED ORDER — IOHEXOL 300 MG/ML  SOLN
100.0000 mL | Freq: Once | INTRAMUSCULAR | Status: AC | PRN
  Administered 2021-01-04: 18:00:00 100 mL via INTRAVENOUS

## 2021-01-04 MED ORDER — SODIUM CHLORIDE 0.9 % IV SOLN
500.0000 mg | Freq: Once | INTRAVENOUS | Status: AC
  Administered 2021-01-04: 19:00:00 500 mg via INTRAVENOUS
  Filled 2021-01-04: qty 5

## 2021-01-04 MED ORDER — SODIUM CHLORIDE 0.9 % IV SOLN: INTRAVENOUS | Status: DC

## 2021-01-04 MED ORDER — SODIUM CHLORIDE 0.9 % IV BOLUS
1000.0000 mL | Freq: Once | INTRAVENOUS | Status: AC
  Administered 2021-01-04: 17:00:00 1000 mL via INTRAVENOUS

## 2021-01-04 MED ORDER — AZITHROMYCIN 250 MG PO TABS: 250.0000 mg | ORAL_TABLET | Freq: Every day | ORAL | 0 refills | Status: DC

## 2021-01-04 MED ORDER — SODIUM CHLORIDE 0.9 % IV SOLN
1.0000 g | Freq: Once | INTRAVENOUS | Status: AC
  Administered 2021-01-04: 20:00:00 1 g via INTRAVENOUS
  Filled 2021-01-04: qty 10

## 2021-01-04 NOTE — Discharge Instructions (Addendum)
The antibiotic Z-Pak as directed.  Take the Imodium A-D as needed for the diarrhea.  Rest and increase fluids.  Return for any new or worse symptoms at all.  Blood cultures are pending if they grow anything you will be notified.  Would recommend follow-up with his doctor next week sometime.  He received his initial antibiotics here tonight.  So he does not need to start any of the new antibiotics till tomorrow.  Also recommend Tylenol for the fevers every 6 hours.

## 2021-01-04 NOTE — ED Provider Notes (Signed)
[place] EMERGENCY DEPT Provider Note   CSN: [id number] Arrival date & time: 01/04/21  1248     History Chief Complaint  Patient presents with   Fever   Diarrhea    Keith Murillo is a 70 y.o. male.  Patient with flulike illness for the past week.  However patient's been suffering with a diarrheal type illness since the end of November.  Symptoms definitely include cough fever congestion and is actually really had diarrhea since the end of November.  It is watery in nature.  No blood.  Patient had exposure over Christmas with grandkids that were sick.  He and his wife came down with flulike illness she got better on Wednesday.  He is continue to struggle.  Arrived here with a blood pressure systolic of 91 temp of 102.5.  Respirations 18 heart rate 85.  EKG that was done prior to my arrival does raise some concerns for anterior infarct.  So we will get troponins.  Lactic acids were done prior to me seeing him.  First 1 was to repeat was 2.3 but patient had not received any fluids.  Blood cultures pending he is got urinalysis pending.  But current vital signs blood pressure is 108 and patient not toxic appearing.  We will see if he received any Tylenol for the fever.  Past medical history is significant for coronary artery disease hyperlipidemia high cholesterol hypertension history of prostate cancer.  Cardiac catheter catheterization in 2009.  An echocardiogram in March 2020.      Past Medical History:  Diagnosis Date   Cancer [facility])    prostate   Chest pain    Complication of anesthesia    fighting with waking up   Coronary artery disease    Heart attack [facility])    Hypercholesterolemia    Hyperlipidemia    Hypertension    Ischemic heart disease September 2004   Known--with non-Q-wave myocardial infarction    Obesity    Sleep apnea     Patient Active Problem List   Diagnosis Date Noted   Benign prostatic hyperplasia with lower urinary tract symptoms  08/31/2019   Erectile dysfunction due to arterial insufficiency 07/30/2018   Sepsis ([place]) 03/12/2018   Right-sided chest wall pain 03/12/2018   Prostate cancer ([place]) 08/03/2017   Chronic venous insufficiency 07/20/2017   Lymphedema 07/20/2017   Hyperlipidemia 07/17/2017   Varicose veins of both lower extremities with complications 85/27/7824   Coronary artery disease involving native heart without angina pectoris 01/30/2017   Hypogonadism in male 12/05/2016    Past Surgical History:  Procedure Laterality Date   CARDIAC CATHETERIZATION  11/2007   INGUINAL HERNIA REPAIR Right 09/22/2019   Procedure: RIGHT INGUINAL HERNIA WITH MESH;  Surgeon: Erroll Luna, MD;  Location: [facility];  Service: General;  Laterality: Right;  TAP BLOCK   TEE WITHOUT CARDIOVERSION N/A 03/16/2018   Procedure: TRANSESOPHAGEAL ECHOCARDIOGRAM (TEE);  Surgeon: Acie Fredrickson Wonda Cheng, MD;  Location: [facility] ENDOSCOPY;  Service: Cardiovascular;  Laterality: N/A;       Family History  Problem Relation Age of Onset   Cancer Mother    Hearing loss Father    Cancer Maternal Grandmother    Parkinson's disease Maternal Grandfather    Stroke Maternal Grandfather    Stroke Paternal Grandmother    Heart attack Paternal Grandfather     Social History   Tobacco Use   Smoking status: Never   Smokeless tobacco: Never  Vaping Use   Vaping Use: Never used  Substance Use Topics   Alcohol use: Yes    Comment: occas   Drug use: No    Home Medications Prior to Admission medications   Medication Sig Start Date End Date Taking? Authorizing Provider  azithromycin (ZITHROMAX) 250 MG tablet Take 1 tablet (250 mg total) by mouth daily. Take first 2 tablets together, then 1 every day until finished. 01/04/21  Yes Fredia Sorrow, MD  aspirin EC 81 MG tablet Take 81 mg by mouth daily. Swallow whole.    [provider]  Cholecalciferol (VITAMIN D-3) 125 MCG (5000 UT) TABS Take 5,000 Units by mouth daily.     [provider]  Coenzyme Q10 ([employer] COQ-10) 200 MG capsule Take 200 mg by mouth daily.    [provider]  CRANBERRY PO Take 1 capsule by mouth daily.    [provider]  DHEA 25 MG CAPS Take 25 mg by mouth daily.     [provider]  Edetate Calcium Disodium POWD Take 600 mg by mouth daily. Calcium Disodium EDTA    [provider]  ibuprofen (ADVIL) 800 MG tablet Take 1 tablet (800 mg total) by mouth every 8 (eight) hours as needed. 09/22/19   Cornett, [name], MD  loperamide (IMODIUM A-D) 2 MG tablet Take 1 tablet (2 mg total) by mouth 4 (four) times daily as needed for diarrhea or loose stools. 01/04/21   Fredia Sorrow, MD  Magnesium Citrate POWD Take 1 Scoop by mouth daily as needed (regularity).    [provider]  melatonin 5 MG TABS Take 5 mg by mouth at bedtime.    [provider]  Menaquinone-7 (VITAMIN K2 PO) Take 150 mcg by mouth daily. MK-7    [provider]  Methylcobalamin 2500 MCG SUBL Take 5,000 mcg by mouth daily.    [provider]  Multiple Vitamin (MULTIVITAMIN WITH MINERALS) TABS tablet Take 1 tablet by mouth daily. Men's Multivitamin by Purity    [provider]  Multiple Vitamins-Minerals (IMMUNE SUPPORT PO) Take 520 mg by mouth daily. Vitac-ld Liposomal Vitamin    [provider]  NON FORMULARY Take 1 capsule by mouth daily. HAIOTB Circulation Syn3rgy (Beet Root, L-Arginine, Horse Chestnut)    [provider]  OVER THE COUNTER MEDICATION Take 2 capsules by mouth daily. [place] grape capsules - 650 mg each - take 2 capsules by mouth daily    [provider]  Pregnenolone Micronized (PREGNENOLONE PO) Take 75 mg by mouth daily.    [provider]  Probiotic Product (PROBIOTIC PO) Take 1 capsule by mouth daily. 20 billion    [provider]  QUERCETIN PO Take 1 capsule by mouth daily. Bio-Quercetin    [provider]  sildenafil (REVATIO) 20 MG  tablet take 2 to 5 tablets by mouth 1 hour prior to intercourse 11/26/20   Stoioff, Ronda Fairly, MD  Turmeric 500 MG TABS Take 500 mg by mouth daily.    [provider]  Zinc 50 MG TABS Take 50 mg by mouth daily.    [provider]    Allergies    Ace inhibitors, Beta adrenergic blockers, Codeine, Lisinopril, and Statins  Review of Systems   Review of Systems  Constitutional:  Positive for appetite change, fatigue and fever. Negative for chills.  HENT:  Positive for congestion. Negative for ear pain and sore throat.   Eyes:  Negative for pain and visual disturbance.  Respiratory:  Positive for cough. Negative for shortness of breath.  Cardiovascular:  Negative for chest pain and palpitations.  Gastrointestinal:  Positive for abdominal pain and diarrhea. Negative for blood in stool and vomiting.  Genitourinary:  Negative for dysuria and hematuria.  Musculoskeletal:  Negative for arthralgias and back pain.  Skin:  Negative for color change and rash.  Neurological:  Negative for seizures and syncope.  All other systems reviewed and are negative.  Physical Exam Updated Vital Signs BP (!) 144/90    Pulse 81    Temp 99.2 F (37.3 C)    Resp (!) 21    Ht 1.702 m (5\' 7" )    Wt 96.2 kg    SpO2 95%    BMI 33.20 kg/m   Physical Exam Vitals and nursing note reviewed.  Constitutional:      General: He is not in acute distress.    Appearance: Normal appearance. He is well-developed. He is ill-appearing and diaphoretic.  HENT:     Head: Normocephalic and atraumatic.  Eyes:     Extraocular Movements: Extraocular movements intact.     Conjunctiva/sclera: Conjunctivae normal.     Pupils: Pupils are equal, round, and reactive to light.  Cardiovascular:     Rate and Rhythm: Normal rate and regular rhythm.     Heart sounds: No murmur heard. Pulmonary:     Effort: Pulmonary effort is normal. No respiratory distress.     Breath sounds: Normal breath sounds. No wheezing or rales.   Abdominal:     Palpations: Abdomen is soft.     Tenderness: There is no abdominal tenderness.  Musculoskeletal:        General: No swelling.     Cervical back: Normal range of motion and neck supple.  Skin:    General: Skin is warm.     Capillary Refill: Capillary refill takes less than 2 seconds.  Neurological:     General: No focal deficit present.     Mental Status: He is alert and oriented to person, place, and time.     Cranial Nerves: No cranial nerve deficit.     Sensory: No sensory deficit.     Motor: No weakness.  Psychiatric:        Mood and Affect: Mood normal.    ED Results / Procedures / Treatments   Labs (all labs ordered are listed, but only abnormal results are displayed) Labs Reviewed  RESP PANEL BY RT-PCR (FLU A&B, COVID) ARPGX2 - Abnormal; Notable for the following components:      Result Value   Influenza A by PCR POSITIVE (*)    All other components within normal limits  LACTIC ACID, PLASMA - Abnormal; Notable for the following components:   Lactic Acid, Venous 2.0 (*)    All other components within normal limits  LACTIC ACID, PLASMA - Abnormal; Notable for the following components:   Lactic Acid, Venous 2.3 (*)    All other components within normal limits  COMPREHENSIVE METABOLIC PANEL - Abnormal; Notable for the following components:   Sodium 132 (*)    Potassium 3.4 (*)    Chloride 96 (*)    Glucose, Bld 122 (*)    Calcium 8.5 (*)    All other components within normal limits  CBC WITH DIFFERENTIAL/PLATELET - Abnormal; Notable for the following components:   WBC 11.7 (*)    Neutro Abs 9.3 (*)    Monocytes Absolute 1.6 (*)    All other components within normal limits  URINALYSIS, ROUTINE W REFLEX MICROSCOPIC - Abnormal; Notable for the following  components:   Hgb urine dipstick SMALL (*)    Protein, ur 100 (*)    All other components within normal limits  TROPONIN I (HIGH SENSITIVITY) - Abnormal; Notable for the following components:    Troponin I (High Sensitivity) 73 (*)    All other components within normal limits  TROPONIN I (HIGH SENSITIVITY) - Abnormal; Notable for the following components:   Troponin I (High Sensitivity) 76 (*)    All other components within normal limits  CULTURE, BLOOD (ROUTINE X 2)  CULTURE, BLOOD (ROUTINE X 2)  LACTIC ACID, PLASMA  LACTIC ACID, PLASMA    EKG EKG Interpretation  Date/Time:  Friday January 04 2021 13:39:49 EST Ventricular Rate:  97 PR Interval:  144 QRS Duration: 90 QT Interval:  360 QTC Calculation: 457 R Axis:   81 Text Interpretation: Normal sinus rhythm Cannot rule out Anterior infarct , age undetermined Abnormal ECG When compared with ECG of 03-Mar-2019 08:05, Vent. rate has increased BY  36 BPM QRS duration has decreased Minimal criteria for Anterior infarct are now Present Confirmed by Fredia Sorrow [phone]) on 01/04/2021 4:46:44 PM  Radiology DG Chest 2 View  Result Date: 01/04/2021 CLINICAL DATA:  Fever, cough, nausea, diarrhea, and chest pain for 5 days EXAM: CHEST - 2 VIEW COMPARISON:  03/12/2018 FINDINGS: The heart size and mediastinal contours are within normal limits. Both lungs are clear. The visualized skeletal structures are unremarkable. IMPRESSION: No active cardiopulmonary disease. Electronically Signed   By: Lucienne Capers M.D.   On: 01/04/2021 15:38   CT CHEST ABDOMEN PELVIS W CONTRAST  Result Date: 01/04/2021 CLINICAL DATA:  Respiratory illness, nondiagnostic xray. flulike symptons, cough, fever , diarrhea x 1 week. EXAM: CT CHEST, ABDOMEN, AND PELVIS WITH CONTRAST TECHNIQUE: Multidetector CT imaging of the chest, abdomen and pelvis was performed following the standard protocol during bolus administration of intravenous contrast. CONTRAST:  12mL OMNIPAQUE IOHEXOL 300 MG/ML  SOLN COMPARISON:  None. FINDINGS: CT CHEST FINDINGS Cardiovascular: Normal heart size. No significant pericardial effusion. The thoracic aorta is normal in caliber. No  atherosclerotic plaque of the thoracic aorta. Three-vessel coronary artery calcifications. Mediastinum/Nodes: No enlarged mediastinal, hilar, or axillary lymph nodes. Thyroid gland, trachea, and esophagus demonstrate no significant findings. Query tiny hiatal hernia. Lungs/Pleura: Right lower lobe peribronchovascular tree-in-bud nodularity. To slightly lesser degree similar findings within the left lower lobe. No pulmonary mass. No pleural effusion. No pneumothorax. Diffuse mild bronchial wall thickening. Musculoskeletal: No chest wall abnormality. No suspicious lytic or blastic osseous lesions. No acute displaced fracture. Multilevel degenerative changes of the spine. CT ABDOMEN PELVIS FINDINGS Hepatobiliary: No focal liver abnormality. No gallstones, gallbladder wall thickening, or pericholecystic fluid. No biliary dilatation. Pancreas: No focal lesion. Normal pancreatic contour. No surrounding inflammatory changes. No main pancreatic ductal dilatation. Spleen: Normal in size without focal abnormality. Adrenals/Urinary Tract: No adrenal nodule bilaterally. Bilateral kidneys enhance symmetrically. Subcentimeter hypodensities are too small to characterize. No hydronephrosis. No hydroureter. The urinary bladder is unremarkable. Stomach/Bowel: Stomach is within normal limits. No evidence of bowel wall thickening or dilatation. Appendix appears normal. Vascular/Lymphatic: No abdominal aorta or iliac aneurysm. Moderate atherosclerotic plaque of the aorta and its branches. No abdominal, pelvic, or inguinal lymphadenopathy. Reproductive: The prostate is enlarged measuring up to 6 cm. Other: No intraperitoneal free fluid. No intraperitoneal free gas. No organized fluid collection. Musculoskeletal: No abdominal wall hernia or abnormality. No suspicious lytic or blastic osseous lesions. No acute displaced fracture. Multilevel degenerative changes of the spine. IMPRESSION: 1. Bilateral lower lobe, right  greater than left,  findings suggestive of atypical infection. No follow-up needed if patient is low-risk (and has no known or suspected primary neoplasm). Non-contrast chest CT can be considered in 12 months if patient is high-risk. This recommendation follows the consensus statement: Guidelines for Management of Incidental Pulmonary Nodules Detected on CT Images: From the Fleischner Society 2017; Radiology 2017; 284:228-243. 2. Prostatomegaly. 3.  Aortic Atherosclerosis (ICD10-I70.0). Electronically Signed   By: Iven Finn M.D.   On: 01/04/2021 18:17    Procedures Procedures   Medications Ordered in ED Medications  0.9 %  sodium chloride infusion ( Intravenous New Bag/Given 01/04/21 1659)  acetaminophen (TYLENOL) tablet 650 mg (650 mg Oral Given 01/04/21 1449)  sodium chloride 0.9 % bolus 1,000 mL (0 mLs Intravenous Stopped 01/04/21 1837)  iohexol (OMNIPAQUE) 300 MG/ML solution 100 mL (100 mLs Intravenous Contrast Given 01/04/21 1749)  cefTRIAXone (ROCEPHIN) 1 g in sodium chloride 0.9 % 100 mL IVPB (1 g Intravenous New Bag/Given 01/04/21 2017)  azithromycin (ZITHROMAX) 500 mg in sodium chloride 0.9 % 250 mL IVPB (500 mg Intravenous New Bag/Given 01/04/21 1911)    ED Course  I have reviewed the triage vital signs and the nursing notes.  Pertinent labs & imaging results that were available during my care of the patient were reviewed by me and considered in my medical decision making (see chart for details).    MDM Rules/Calculators/A&P                         Patient based on initial vital signs some concern for sepsis parameters.  But without any intervention vital signs since that time not meeting sepsis parameters.  Initial lactic acid was 2 repeat was 2.3.  Blood cultures pending urine analysis and urine culture pending.  Complete metabolic panel has potassium slightly down at 3.4 sodium of 132 renal functions good with a GFR greater than 60.  Anion gap is normal.  CBC has a white count of 16,109  certainly less than 60,454 which is reassuring hemoglobin is good.  Patient's is positive for influenza A which they kind is surmised but now it is confirmed.  Chest x-ray had no acute cardiopulmonary findings.  Will give patient IV fluids will tend to follow the trend of the blood pressures.  We will see if he needs Tylenol.  We will CT his chest and CT as abdomen pelvis for the longstanding diarrhea and to rule out a occult pneumonia.  Not clear whether patient will require admission at this time.  CT abdomen without any acute findings.  No evidence of enteritis or colitis.  Patient's follow-up lactic acid after fluids was 1.6.  Which was reassuring.  Patient's blood pressure is now in the 140s.  Troponin went from 73-76 and no significant delta change.  Also patient not having any chest pain.  Patient received Rocephin and Zithromax IV here for CT chest that showed evidence of pneumonia.  Patient will continue with Z-Pak as an outpatient.  Overall patient with significant improvement blood cultures are pending but I think patient stable for discharge home.  We will treat him with Imodium A-D and a Z-Pak.  He will take Tylenol for fevers.  And continue to rest and increase fluids.   Final Clinical Impression(s) / ED Diagnoses Final diagnoses:  Influenza A  Atypical pneumonia    Rx / DC Orders ED Discharge Orders          Ordered    azithromycin (  ZITHROMAX) 250 MG tablet  Daily        01/04/21 2113    loperamide (IMODIUM A-D) 2 MG tablet  4 times daily PRN,   Status:  Discontinued        01/04/21 2113    loperamide (IMODIUM A-D) 2 MG tablet  4 times daily PRN        01/04/21 2113             Fredia Sorrow, MD 01/04/21 2118

## 2021-01-04 NOTE — ED Triage Notes (Signed)
Pt BIB [facility] with c/o flulike symptons, cough, fever , diarrhea x 1 week.

## 2021-01-09 ENCOUNTER — Other Ambulatory Visit: Payer: Self-pay

## 2021-01-09 ENCOUNTER — Ambulatory Visit (INDEPENDENT_AMBULATORY_CARE_PROVIDER_SITE_OTHER): Payer: Medicare Other | Admitting: Nurse Practitioner

## 2021-01-09 ENCOUNTER — Encounter: Payer: Self-pay | Admitting: Nurse Practitioner

## 2021-01-09 VITALS — BP 132/84 | HR 76 | Temp 97.3°F | Resp 14 | Ht 67.0 in | Wt 211.1 lb

## 2021-01-09 DIAGNOSIS — R7989 Other specified abnormal findings of blood chemistry: Secondary | ICD-10-CM | POA: Diagnosis not present

## 2021-01-09 DIAGNOSIS — I700 Atherosclerosis of aorta: Secondary | ICD-10-CM

## 2021-01-09 DIAGNOSIS — E876 Hypokalemia: Secondary | ICD-10-CM

## 2021-01-09 DIAGNOSIS — R197 Diarrhea, unspecified: Secondary | ICD-10-CM | POA: Insufficient documentation

## 2021-01-09 DIAGNOSIS — Z8701 Personal history of pneumonia (recurrent): Secondary | ICD-10-CM

## 2021-01-09 LAB — CULTURE, BLOOD (ROUTINE X 2)
Culture: NO GROWTH
Culture: NO GROWTH
Special Requests: ADEQUATE
Special Requests: ADEQUATE

## 2021-01-09 NOTE — Assessment & Plan Note (Signed)
Has been going on for a month.  Patient has tried over-the-counter medication without relief.  Did have CT chest abdomen pelvis with contrast showed no acute abnormality in the GI tract.  We will get stool cultures and ova and parasite test if negative refer to GI.

## 2021-01-09 NOTE — Assessment & Plan Note (Signed)
Incidental finding on CT chest abdomen and pelvis.  Not currently on statin

## 2021-01-09 NOTE — Progress Notes (Signed)
Acute Office Visit  Subjective:    Patient ID: Keith Murillo, male    DOB: 11-27-1950, 71 y.o.   MRN: [id number]  Chief Complaint  Patient presents with   ER folow up    Was diagnosed with Flu and pneumonia on 01/04/21. Took last Zpak today. Patient states he is doing better. He is still having diarrhea issue which has been present for about a month. Has this daily 2 to 3 times a day. Has tried Immodium and cut out supplements. Nausea at times. No vomiting.    HPI Patient is in today for Hospital follow up  Was seen in ED on 01/04/2021 Dx with flu and pna. Was givne IV rocephin and azithromycin. Was D/c'd on zpak and imodium because of diarrhea.   CT chest abdomen and pelvis perfromed that showed bilateral lower lobe atypical infection Right greater than left.  Prostatomegaly and Aortic atherosclerosis  Finished z pak today. Has been taking mucinex.  Having an improvement in the symptoms.  Has tried immodium and kaopectate. The medications have not helped with diarrhea. Has been ongoing for over a month.  2-3 episodes a day. No blood in stool. No GI doctor so far. Has seen Dr. Elwyn Lade in the past for colonoscopy  Has urology, cardiology, and pulmonary doctor. Past Medical History:  Diagnosis Date   Cancer [facility])    prostate   Chest pain    Complication of anesthesia    fighting with waking up   Coronary artery disease    Heart attack [facility])    Hypercholesterolemia    Hyperlipidemia    Hypertension    Ischemic heart disease September 2004   Known--with non-Q-wave myocardial infarction    Obesity    Sleep apnea     Past Surgical History:  Procedure Laterality Date   CARDIAC CATHETERIZATION  11/2007   INGUINAL HERNIA REPAIR Right 09/22/2019   Procedure: RIGHT INGUINAL HERNIA WITH MESH;  Surgeon: Erroll Luna, MD;  Location: [place];  Service: General;  Laterality: Right;  TAP BLOCK   TEE WITHOUT CARDIOVERSION N/A 03/16/2018   Procedure: TRANSESOPHAGEAL ECHOCARDIOGRAM  (TEE);  Surgeon: Acie Fredrickson, Wonda Cheng, MD;  Location: [facility] ENDOSCOPY;  Service: Cardiovascular;  Laterality: N/A;    Family History  Problem Relation Age of Onset   Cancer Mother    Hearing loss Father    Cancer Maternal Grandmother    Parkinson's disease Maternal Grandfather    Stroke Maternal Grandfather    Stroke Paternal Grandmother    Heart attack Paternal Grandfather     Social History   Socioeconomic History   Marital status: Married    Spouse name: Not on file   Number of children: Not on file   Years of education: Not on file   Highest education level: Not on file  Occupational History   Not on file  Tobacco Use   Smoking status: Never   Smokeless tobacco: Never  Vaping Use   Vaping Use: Never used  Substance and Sexual Activity   Alcohol use: Yes    Comment: occas   Drug use: No   Sexual activity: Yes  Other Topics Concern   Not on file  Social History Narrative   Not on file   Social Determinants of Health   Financial Resource Strain: Not on file  Food Insecurity: Not on file  Transportation Needs: Not on file  Physical Activity: Not on file  Stress: Not on file  Social Connections: Not on file  Intimate Partner Violence:  Not on file    Outpatient Medications Prior to Visit  Medication Sig Dispense Refill   aspirin EC 81 MG tablet Take 81 mg by mouth daily. Swallow whole.     Cholecalciferol (VITAMIN D-3) 125 MCG (5000 UT) TABS Take 5,000 Units by mouth daily.      Coenzyme Q10 (CVS COQ-10) 200 MG capsule Take 200 mg by mouth daily.     CRANBERRY PO Take 1 capsule by mouth daily.     DHEA 25 MG CAPS Take 25 mg by mouth daily.      Edetate Calcium Disodium POWD Take 600 mg by mouth daily. Calcium Disodium EDTA     ibuprofen (ADVIL) 800 MG tablet Take 1 tablet (800 mg total) by mouth every 8 (eight) hours as needed. 30 tablet 0   loperamide (IMODIUM A-D) 2 MG tablet Take 1 tablet (2 mg total) by mouth 4 (four) times daily as needed for diarrhea or loose  stools. 30 tablet 0   Magnesium Citrate POWD Take 1 Scoop by mouth daily as needed (regularity).     melatonin 5 MG TABS Take 5 mg by mouth at bedtime.     Menaquinone-7 (VITAMIN K2 PO) Take 150 mcg by mouth daily. MK-7     Methylcobalamin 2500 MCG SUBL Take 5,000 mcg by mouth daily.     Multiple Vitamin (MULTIVITAMIN WITH MINERALS) TABS tablet Take 1 tablet by mouth daily. Men's Multivitamin by Purity     Multiple Vitamins-Minerals (IMMUNE SUPPORT PO) Take 520 mg by mouth daily. Vitac-ld Liposomal Vitamin     NON FORMULARY Take 1 capsule by mouth daily. HAIOTB Circulation Syn3rgy (Beet Root, L-Arginine, Horse Chestnut)     NP THYROID 60 MG tablet Take 60 mg by mouth every morning.     OVER THE COUNTER MEDICATION Take 2 capsules by mouth daily. [place]cad[place] grape capsules - 650 mg each - take 2 capsules by mouth daily     Pregnenolone Micronized (PREGNENOLONE PO) Take 75 mg by mouth daily.     Probiotic Product (PROBIOTIC PO) Take 1 capsule by mouth daily. 20 billion     QUERCETIN PO Take 1 capsule by mouth daily. Bio-Quercetin     sildenafil (REVATIO) 20 MG tablet take 2 to 5 tablets by mouth 1 hour prior to intercourse 90 tablet 0   testosterone cypionate (DEPOTESTOSTERONE CYPIONATE) 200 MG/ML injection Once a week     Turmeric 500 MG TABS Take 500 mg by mouth daily.     Zinc 50 MG TABS Take 50 mg by mouth daily.     azithromycin (ZITHROMAX) 250 MG tablet Take 1 tablet (250 mg total) by mouth daily. Take first 2 tablets together, then 1 every day until finished. 6 tablet 0   No facility-administered medications prior to visit.    Allergies  Allergen Reactions   Ace Inhibitors     cough   Beta Adrenergic Blockers     Fatigue   Codeine     Other reaction(s): Unknown   Lisinopril Cough   Statins     myalgias    Review of Systems  Constitutional:  Positive for fatigue. Negative for chills and fever.  Respiratory:  Positive for cough (improved. dry). Negative for shortness of breath.    Cardiovascular:  Negative for chest pain.  Gastrointestinal:  Positive for diarrhea and nausea. Negative for abdominal pain (gnawing discomfort in epigastric), blood in stool and vomiting.  Neurological:  Positive for headaches. Negative for dizziness and light-headedness.  Objective:    Physical Exam Vitals and nursing note reviewed.  Constitutional:      Appearance: Normal appearance.  HENT:     Mouth/Throat:     Mouth: Mucous membranes are moist.     Pharynx: Oropharynx is clear.  Cardiovascular:     Rate and Rhythm: Normal rate and regular rhythm.     Heart sounds: Normal heart sounds.  Pulmonary:     Effort: Pulmonary effort is normal.     Breath sounds: Normal breath sounds.  Abdominal:     General: Bowel sounds are normal. There is no distension.     Palpations: There is no mass.     Tenderness: There is no abdominal tenderness.  Lymphadenopathy:     Cervical: No cervical adenopathy.  Skin:    General: Skin is warm.  Neurological:     Mental Status: He is alert.  Psychiatric:        Mood and Affect: Mood normal.        Behavior: Behavior normal.        Thought Content: Thought content normal.        Judgment: Judgment normal.    BP 132/84    Pulse 76    Temp (!) 97.3 F (36.3 C)    Resp 14    Ht 5\' 7"  (1.702 m)    Wt 211 lb 2 oz (95.8 kg)    SpO2 98%    BMI 33.07 kg/m  Wt Readings from Last 3 Encounters:  01/09/21 211 lb 2 oz (95.8 kg)  01/04/21 212 lb (96.2 kg)  09/12/20 205 lb (93 kg)    Health Maintenance Due  Topic Date Due   Pneumonia Vaccine 59+ Years old (1 - PCV) Never done   Hepatitis C Screening  Never done   TETANUS/TDAP  Never done   Zoster Vaccines- Shingrix (1 of 2) Never done   COLONOSCOPY (Pts 45-68yrs Insurance coverage will need to be confirmed)  10/18/2016    There are no preventive care reminders to display for this patient.   Lab Results  Component Value Date   TSH 0.833 03/13/2018   Lab Results  Component Value Date    WBC 11.7 (H) 01/04/2021   HGB 16.3 01/04/2021   HCT 47.7 01/04/2021   MCV 85.0 01/04/2021   PLT 193 01/04/2021   Lab Results  Component Value Date   NA 132 (L) 01/04/2021   K 3.4 (L) 01/04/2021   CO2 24 01/04/2021   GLUCOSE 122 (H) 01/04/2021   BUN 16 01/04/2021   CREATININE 1.03 01/04/2021   BILITOT 0.7 01/04/2021   ALKPHOS 45 01/04/2021   AST 31 01/04/2021   ALT 17 01/04/2021   PROT 7.0 01/04/2021   ALBUMIN 4.1 01/04/2021   CALCIUM 8.5 (L) 01/04/2021   ANIONGAP 12 01/04/2021   GFR 80.48 04/03/2017   Lab Results  Component Value Date   CHOL 139 03/03/2019   Lab Results  Component Value Date   HDL 53 03/03/2019   Lab Results  Component Value Date   LDLCALC 72 03/03/2019   Lab Results  Component Value Date   TRIG 67 03/03/2019   Lab Results  Component Value Date   CHOLHDL 2.6 03/03/2019   Lab Results  Component Value Date   HGBA1C 5.9 03/26/2018       Assessment & Plan:   Problem List Items Addressed This Visit       Cardiovascular and Mediastinum   Aortic atherosclerosis ([place])    Incidental  finding on CT chest abdomen and pelvis.  Not currently on statin        Other   Diarrhea    Has been going on for a month.  Patient has tried over-the-counter medication without relief.  Did have CT chest abdomen pelvis with contrast showed no acute abnormality in the GI tract.  We will get stool cultures and ova and parasite test if negative refer to GI.      Relevant Orders   Ova and parasite examination   Gastrointestinal Pathogen Panel PCR   Abnormal CBC    CBC slightly abnormal in hospital we will recheck today pending lab result      Relevant Orders   CBC   Hypokalemia    Patient had incidental finding of hyponatremia hypokalemia and hypochloremia on lab work recheck today pending lab result      Relevant Orders   Comprehensive metabolic panel   History of pneumonia - Primary    Follow-up for atypical pneumonia seen on CT scan.  Patient is low  risk does not require follow-up per radiology guidelines if high risk he will need CT in 1 year.  Chest x-ray was negative        No orders of the defined types were placed in this encounter.  This visit occurred during the SARS-CoV-2 public health emergency.  Safety protocols were in place, including screening questions prior to the visit, additional usage of staff PPE, and extensive cleaning of exam room while observing appropriate contact time as indicated for disinfecting solutions.    Romilda Garret, NP

## 2021-01-09 NOTE — Assessment & Plan Note (Signed)
Patient had incidental finding of hyponatremia hypokalemia and hypochloremia on lab work recheck today pending lab result

## 2021-01-09 NOTE — Assessment & Plan Note (Signed)
Follow-up for atypical pneumonia seen on CT scan.  Patient is low risk does not require follow-up per radiology guidelines if high risk he will need CT in 1 year.  Chest x-ray was negative

## 2021-01-09 NOTE — Patient Instructions (Signed)
Nice to see you today I will be in touch with labs and once we get stool studies Follow up with me in a couple months or sooner for a TOC If you do not continue to improve see me sooner

## 2021-01-09 NOTE — Assessment & Plan Note (Signed)
CBC slightly abnormal in hospital we will recheck today pending lab result

## 2021-01-10 ENCOUNTER — Other Ambulatory Visit: Payer: Medicare Other

## 2021-01-10 DIAGNOSIS — R197 Diarrhea, unspecified: Secondary | ICD-10-CM | POA: Diagnosis not present

## 2021-01-10 LAB — COMPREHENSIVE METABOLIC PANEL
ALT: 30 U/L (ref 0–53)
AST: 41 U/L — ABNORMAL HIGH (ref 0–37)
Albumin: 3.7 g/dL (ref 3.5–5.2)
Alkaline Phosphatase: 48 U/L (ref 39–117)
BUN: 14 mg/dL (ref 6–23)
CO2: 25 mEq/L (ref 19–32)
Calcium: 8.8 mg/dL (ref 8.4–10.5)
Chloride: 104 mEq/L (ref 96–112)
Creatinine, Ser: 0.86 mg/dL (ref 0.40–1.50)
GFR: 88.42 mL/min (ref >60.00)
Glucose, Bld: 86 mg/dL (ref 70–99)
Potassium: 3.5 mEq/L (ref 3.5–5.1)
Sodium: 138 mEq/L (ref 135–145)
Total Bilirubin: 0.5 mg/dL (ref 0.2–1.2)
Total Protein: 6.6 g/dL (ref 6.0–8.3)

## 2021-01-10 LAB — CBC
HCT: 46.5 % (ref 39.0–52.0)
Hemoglobin: 15.7 g/dL (ref 13.0–17.0)
MCHC: 33.7 g/dL (ref 30.0–36.0)
MCV: 85.6 fl (ref 78.0–100.0)
Platelets: 365 10*3/uL (ref 150.0–400.0)
RBC: 5.43 Mil/uL (ref 4.22–5.81)
RDW: 13.9 % (ref 11.5–15.5)
WBC: 5.8 10*3/uL (ref 4.0–10.5)

## 2021-01-14 ENCOUNTER — Other Ambulatory Visit: Payer: Self-pay | Admitting: Nurse Practitioner

## 2021-01-14 DIAGNOSIS — R197 Diarrhea, unspecified: Secondary | ICD-10-CM

## 2021-01-15 LAB — GASTROINTESTINAL PATHOGEN PANEL PCR
C. difficile Tox A/B, PCR: NOT DETECTED
Campylobacter, PCR: NOT DETECTED
Cryptosporidium, PCR: NOT DETECTED
E coli (ETEC) LT/ST PCR: NOT DETECTED
E coli (STEC) stx1/stx2, PCR: NOT DETECTED
E coli 0157, PCR: NOT DETECTED
Giardia lamblia, PCR: NOT DETECTED
Norovirus, PCR: NOT DETECTED
Rotavirus A, PCR: NOT DETECTED
Salmonella, PCR: NOT DETECTED
Shigella, PCR: NOT DETECTED

## 2021-01-15 LAB — OVA AND PARASITE EXAMINATION
CONCENTRATE RESULT:: NONE SEEN
MICRO NUMBER:: 12831976
SPECIMEN QUALITY:: ADEQUATE
TRICHROME RESULT:: NONE SEEN

## 2021-03-14 ENCOUNTER — Other Ambulatory Visit: Payer: Medicare Other

## 2021-03-18 ENCOUNTER — Ambulatory Visit: Payer: Medicare Other | Admitting: Urology

## 2021-03-20 ENCOUNTER — Ambulatory Visit: Payer: Medicare Other | Admitting: Urology

## 2021-04-10 ENCOUNTER — Other Ambulatory Visit: Payer: Self-pay | Admitting: Urology

## 2021-07-29 NOTE — Progress Notes (Unsigned)
Cardiology Office Note  Date:  07/30/2021   ID:  Keith Murillo, DOB 18-Feb-1950, MRN [id number]  PCP:  [name], FNP   Chief Complaint  Patient presents with   12 month follow up     Patient c/o pain on the right side of chest when mowing the lawn. Medications reviewed by the patient verbally.     HPI:  71 yo male with history of CAD s/p remote PCI to LAD and RCA (2004, 2009), (4 stents total)  HLD,  OSA not on CPAP prostate cancer, CAD, 2004 with NSTEMI, RCA stenting  cath in 2009 with PCI to the LAD Off statins "since 2010" Who presents for f/u of his CAD.  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  EF by echo 60-65%. Crestor '10mg'$  daily (now discontinued 2/2 myalgias).    cough on ACEi   stress test in 07/2018.EF 56% and NRWMA.  small defect of mild severity noted in the mid inferolateral and apical inferior location and findings found consistent with a small area of apical inferolateral ischemia.   Lab Results  Component Value Date   CHOL 139 03/03/2019   HDL 53 03/03/2019   LDLCALC 72 03/03/2019   TRIG 67 03/03/2019      PMH:   has a past medical history of Cancer [facility]), Chest pain, Complication of  anesthesia, Coronary artery disease, Heart attack ([place]), Hypercholesterolemia, Hyperlipidemia, Hypertension, Ischemic heart disease (September 2004), Obesity, and Sleep apnea.  PSH:    Past Surgical History:  Procedure Laterality Date   CARDIAC CATHETERIZATION  11/2007   INGUINAL HERNIA REPAIR Right 09/22/2019   Procedure: RIGHT INGUINAL HERNIA WITH MESH;  Surgeon: Erroll Luna, MD;  Location: [place];  Service: General;  Laterality: Right;  TAP BLOCK   TEE WITHOUT CARDIOVERSION N/A 03/16/2018   Procedure: TRANSESOPHAGEAL ECHOCARDIOGRAM (TEE);  Surgeon: Acie Fredrickson Wonda Cheng, MD;  Location: [facility] ENDOSCOPY;  Service: Cardiovascular;  Laterality: N/A;    Current Outpatient Medications  Medication Sig Dispense Refill   Cholecalciferol (VITAMIN D-3) 125 MCG (5000 UT) TABS Take 5,000 Units by mouth daily.      Coenzyme Q10 (CVS COQ-10) 200 MG capsule Take 200 mg by mouth daily.     CRANBERRY PO Take 1 capsule by mouth daily.     DHEA 25 MG CAPS Take 25 mg by mouth daily.      Edetate Calcium Disodium POWD Take 600 mg by mouth daily. Calcium Disodium EDTA     ibuprofen (ADVIL) 800 MG tablet Take 1 tablet (800 mg total) by mouth every 8 (eight) hours as needed. 30 tablet 0   Magnesium Citrate POWD Take 1 Scoop by mouth daily as needed (regularity).     melatonin 5 MG TABS Take 5 mg by mouth at bedtime.  Menaquinone-7 (VITAMIN K2 PO) Take 150 mcg by mouth daily. MK-7     Methylcobalamin 2500 MCG SUBL Take 5,000 mcg by mouth daily.     Multiple Vitamin (MULTIVITAMIN WITH MINERALS) TABS tablet Take 1 tablet by mouth daily. Men's Multivitamin by Purity     Multiple Vitamins-Minerals (IMMUNE SUPPORT PO) Take 520 mg by mouth daily. Vitac-ld Liposomal Vitamin     NON FORMULARY Take 1 capsule by mouth daily. HAIOTB Circulation Syn3rgy (Beet Root, L-Arginine, Horse Chestnut)     NP THYROID 60 MG tablet Take 60 mg by mouth every morning.     OVER THE COUNTER MEDICATION Take 2 capsules by mouth daily. [place]cadine  grape capsules - 650 mg each - take 2 capsules by mouth daily     Pregnenolone Micronized (PREGNENOLONE PO) Take 75 mg by mouth daily.     Probiotic Product (PROBIOTIC PO) Take 1 capsule by mouth daily. 20 billion     QUERCETIN PO Take 1 capsule by mouth daily. Bio-Quercetin     sildenafil (REVATIO) 20 MG tablet TAKE TWO TO FIVE TABLETS BY MOUTH 1 HOUR PRIOR TO INTERCOURSE 90 tablet 0   testosterone cypionate (DEPOTESTOSTERONE CYPIONATE) 200 MG/ML injection Once a week     Turmeric 500 MG TABS Take 500 mg by mouth daily.     Zinc 50 MG TABS Take 50 mg by mouth daily.     loperamide (IMODIUM A-D) 2 MG tablet Take 1 tablet (2 mg total) by mouth 4 (four) times daily as needed for diarrhea or loose stools. (Patient not taking: Reported on 07/30/2021) 30 tablet 0   No current facility-administered medications for this visit.     Allergies:   Ace inhibitors, Beta adrenergic blockers, Codeine, Lisinopril, and Statins   Social History:  The patient  reports that he has never smoked. He has never used smokeless tobacco. He reports current alcohol use. He reports that he does not use drugs.   Family History:   family history includes Cancer in his maternal grandmother and mother; Hearing loss in his father; Heart attack in his paternal grandfather; Parkinson's disease in his maternal grandfather; Stroke in his maternal grandfather and paternal grandmother.    Review of Systems: Review of Systems  Constitutional: Negative.   HENT: Negative.    Respiratory: Negative.    Cardiovascular: Negative.   Gastrointestinal: Negative.   Musculoskeletal: Negative.   Neurological: Negative.   Psychiatric/Behavioral: Negative.    All other systems reviewed and are negative.   PHYSICAL EXAM: VS:  BP 140/90 (BP Location: Left Arm, Patient Position: Sitting, Cuff Size: Normal)   Pulse 69   Ht '5\' 7"'$  (1.702 m)   Wt 219 lb 8 oz (99.6 kg)   SpO2 98%   BMI 34.38 kg/m  , BMI Body mass index is 34.38  kg/m. Constitutional:  oriented to person, place, and time. No distress.  HENT:  Head: Grossly normal Eyes:  no discharge. No scleral icterus.  Neck: No JVD, no carotid bruits  Cardiovascular: Regular rate and rhythm, no murmurs appreciated Pulmonary/Chest: Clear to auscultation bilaterally, no wheezes or rails Abdominal: Soft.  no distension.  no tenderness.  Musculoskeletal: Normal range of motion Neurological:  normal muscle tone. Coordination normal. No atrophy Skin: Skin warm and dry Psychiatric: normal affect, pleasant   Recent Labs: 01/09/2021: ALT 30; BUN 14; Creatinine, Ser 0.86; Hemoglobin 15.7; Platelets 365.0; Potassium 3.5; Sodium 138    Lipid Panel Lab Results  Component Value Date   CHOL 139 03/03/2019  HDL 53 03/03/2019   LDLCALC 72 03/03/2019   TRIG 67 03/03/2019      Wt Readings from Last 3 Encounters:  07/30/21 219 lb 8 oz (99.6 kg)  01/09/21 211 lb 2 oz (95.8 kg)  01/04/21 212 lb (96.2 kg)     ASSESSMENT AND PLAN:  Problem List Items Addressed This Visit       Cardiology Problems   Aortic atherosclerosis ([place])   Hyperlipidemia   Other Visit Diagnoses     Coronary artery disease of native artery of native heart with stable angina pectoris ([place])    -  Primary   BMI 33.0-33.9,adult       OSA on CPAP          Coronary artery disease with stable angina  prior stenting 2004, 2009 He does not take his Crestor since 2010 prefers to be on no medication We have ordered lipid panel Previously took himself off beta-blockers secondary to fatigue  Sleep apnea not using his CPAP Reports sleep disordered breathing better after weight loss  History of prostate cancer Followed by urology, PSA discussed  Obesity Weight down tremendously following a strict diet,  Active, does some work in [employer]  Hyperlipidemia Prefers not to be on a statin or prescription medication Lipid panel ordered    Total encounter time more than 30 minutes   Greater than 50% was spent in counseling and coordination of care with the patient    Signed, Esmond Plants, M.D., Ph.D. [place], [place]

## 2021-07-30 ENCOUNTER — Ambulatory Visit: Payer: Medicare Other | Admitting: Cardiovascular Disease

## 2021-07-30 ENCOUNTER — Encounter: Payer: Self-pay | Admitting: Cardiovascular Disease

## 2021-07-30 VITALS — BP 140/90 | HR 69 | Ht 67.0 in | Wt 219.5 lb

## 2021-07-30 DIAGNOSIS — Z6833 Body mass index (BMI) 33.0-33.9, adult: Secondary | ICD-10-CM

## 2021-07-30 DIAGNOSIS — E782 Mixed hyperlipidemia: Secondary | ICD-10-CM | POA: Diagnosis not present

## 2021-07-30 DIAGNOSIS — G4733 Obstructive sleep apnea (adult) (pediatric): Secondary | ICD-10-CM

## 2021-07-30 DIAGNOSIS — I25118 Atherosclerotic heart disease of native coronary artery with other forms of angina pectoris: Secondary | ICD-10-CM | POA: Diagnosis not present

## 2021-07-30 DIAGNOSIS — I700 Atherosclerosis of aorta: Secondary | ICD-10-CM

## 2021-07-30 DIAGNOSIS — Z9989 Dependence on other enabling machines and devices: Secondary | ICD-10-CM

## 2021-07-30 NOTE — Patient Instructions (Addendum)
Medication Instructions:  No changes  If you need a refill on your cardiac medications before your next appointment, please call your pharmacy.   Lab work:  FASTING lipid profile at [facility] at your convenience   Testing/Procedures: No new testing needed  Follow-Up: At [facility], you and your health needs are our priority.  As part of our continuing mission to provide you with exceptional heart care, we have created designated Provider Care Teams.  These Care Teams include your primary Cardiologist (physician) and Advanced Practice Providers (APPs -  Physician Assistants and Nurse Practitioners) who all work together to provide you with the care you need, when you need it.  You will need a follow up appointment in 12 months  Providers on your designated Care Team:   Murray Hodgkins, NP Christell Faith, PA-C Cadence Kathlen Mody, [place]  COVID-19 Vaccine Information can be found at: ShippingScam.co.uk For questions related to vaccine distribution or appointments, please email [email or web address] or call [phone].

## 2021-07-31 LAB — LIPID PANEL
Chol/HDL Ratio: 4 ratio (ref 0.0–5.0)
Cholesterol, Total: 186 mg/dL (ref 100–199)
HDL: 47 mg/dL (ref >39)
LDL Chol Calc (NIH): 114 mg/dL — ABNORMAL HIGH (ref 0–99)
Triglycerides: 142 mg/dL (ref 0–149)
VLDL Cholesterol Cal: 25 mg/dL (ref 5–40)

## 2021-08-01 ENCOUNTER — Telehealth: Payer: Self-pay | Admitting: Emergency Medicine

## 2021-08-01 NOTE — Telephone Encounter (Signed)
-----   Message from Minna Merritts, MD sent at 07/31/2021  9:01 AM EDT ----- Cholesterol labs Numbers running high Ideally total cholesterol should be 140 or less, we are running 186 LDL should be less than 55, current measurement 114 Options include statin, statin with Zetia, PCSK9 inhibitor injection

## 2021-08-01 NOTE — Telephone Encounter (Signed)
Called patient, went over results and recommendations. Pt verbalized understanding.   Stated that he's had issues with statins in the past and would not like to start taking anything for his cholesterol at this time. Stated that at his appointment he told Dr. Rockey Situ that he believes his weight running higher is causing this, and he's working on diet and exercise and wants to see if this will bring his cholesterol down.   Will forward to MD as FYI.

## 2021-08-03 ENCOUNTER — Other Ambulatory Visit: Payer: Self-pay

## 2021-08-03 ENCOUNTER — Encounter (HOSPITAL_BASED_OUTPATIENT_CLINIC_OR_DEPARTMENT_OTHER): Payer: Self-pay

## 2021-08-03 ENCOUNTER — Emergency Department (HOSPITAL_BASED_OUTPATIENT_CLINIC_OR_DEPARTMENT_OTHER)
Admission: EM | Admit: 2021-08-03 | Discharge: 2021-08-03 | Disposition: A | Discharge status: Home / Self Care | Payer: Medicare Other | Attending: Emergency Medicine | Admitting: Emergency Medicine

## 2021-08-03 DIAGNOSIS — I10 Essential (primary) hypertension: Secondary | ICD-10-CM | POA: Diagnosis not present

## 2021-08-03 DIAGNOSIS — S0591XA Unspecified injury of right eye and orbit, initial encounter: Secondary | ICD-10-CM | POA: Diagnosis present

## 2021-08-03 DIAGNOSIS — X58XXXA Exposure to other specified factors, initial encounter: Secondary | ICD-10-CM | POA: Diagnosis not present

## 2021-08-03 DIAGNOSIS — S0501XA Injury of conjunctiva and corneal abrasion without foreign body, right eye, initial encounter: Secondary | ICD-10-CM | POA: Diagnosis not present

## 2021-08-03 MED ORDER — TETRACAINE HCL 0.5 % OP SOLN
2.0000 [drp] | Freq: Once | OPHTHALMIC | Status: AC
  Administered 2021-08-03: 2 [drp] via OPHTHALMIC
  Filled 2021-08-03: qty 4

## 2021-08-03 MED ORDER — FLUORESCEIN SODIUM 1 MG OP STRP
1.0000 | ORAL_STRIP | Freq: Once | OPHTHALMIC | Status: AC
  Administered 2021-08-03: 1 via OPHTHALMIC
  Filled 2021-08-03: qty 1

## 2021-08-03 MED ORDER — CIPROFLOXACIN HCL 0.3 % OP SOLN: 2.0000 [drp] | Freq: Four times a day (QID) | OPHTHALMIC | 0 refills | Status: AC

## 2021-08-03 NOTE — ED Triage Notes (Signed)
Patient here POV from Home.  Endorses Possibly Scratching or Sustaining a FB (Metal Shaving) to his Right Eye approximately 1 week ago.  Pain subsided somewhat but Pain worsened this AM. Redness noted to Same Eye.   No Visual Aids. 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   NAD Noted during Triage. A&Ox4. GCS 15. Ambulatory.

## 2021-08-03 NOTE — ED Notes (Signed)
Reviewed AVS/discharge instruction with patient. Time allotted for and all questions answered. Patient is agreeable for d/c and escorted to ed exit by staff.  

## 2021-08-03 NOTE — ED Provider Notes (Signed)
[facility] EMERGENCY DEPT Provider Note   CSN: [id number] Arrival date & time: 08/03/21  1334     History  Chief Complaint  Patient presents with  . Eye Pain    Keith Murillo is a 71 y.o. male.  With medical history of hypercholesterolemia, hypertension who presents to the emergency department with right eye pain.  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  He states that he went back to his car and wiped his eye and felt like he moved a foreign body further back into his eye.  He states that since then he has had intermittent irritation and foreign body feeling.  He also states that he has had some sensitivity to the sun.  He has tried to use some saline rinse without improvement in his symptoms.  He denies any blurry vision, fevers, swelling around the eye or pressure sensation.  Does not wear contacts.   Eye Pain      Home Medications Prior to Admission medications   Medication Sig Start Date End Date Taking? Authorizing Provider  Cholecalciferol (VITAMIN D-3) 125 MCG (5000 UT) TABS Take 5,000 Units by mouth daily.     [provider]  Coenzyme Q10 ([employer] COQ-10) 200 MG capsule Take 200 mg by mouth daily.    [provider]  CRANBERRY PO Take 1 capsule by mouth daily.    [provider]  DHEA 25 MG CAPS Take 25 mg by mouth daily.     [provider]  Edetate Calcium Disodium POWD Take 600 mg by mouth daily. Calcium Disodium EDTA    [provider]  ibuprofen (ADVIL) 800 MG tablet Take 1 tablet (800 mg total) by mouth every 8 (eight) hours as needed. 09/22/19   Cornett, [name], MD  loperamide (IMODIUM A-D) 2 MG tablet Take 1 tablet (2 mg total) by mouth 4 (four) times daily as needed for diarrhea or loose stools. Patient not taking: Reported on 07/30/2021 01/04/21   Fredia Sorrow, MD  Magnesium Citrate POWD Take 1 Scoop by mouth daily as needed  (regularity).    [provider]  melatonin 5 MG TABS Take 5 mg by mouth at bedtime.    [provider]  Menaquinone-7 (VITAMIN K2 PO) Take 150 mcg by mouth daily. MK-7    [provider]  Methylcobalamin 2500 MCG SUBL Take 5,000 mcg by mouth daily.    [provider]  Multiple Vitamin (MULTIVITAMIN WITH MINERALS) TABS tablet Take 1 tablet by mouth daily. Men's Multivitamin by Purity    [provider]  Multiple Vitamins-Minerals (IMMUNE SUPPORT PO) Take 520 mg by mouth daily. Vitac-ld Liposomal Vitamin    [provider]  NON FORMULARY Take 1 capsule by mouth daily. HAIOTB Circulation Syn3rgy (Beet Root, L-Arginine, Horse Chestnut)    [provider]  NP THYROID 60 MG tablet Take 60 mg by mouth every morning. 12/03/20   [provider]  OVER THE COUNTER MEDICATION Take 2 capsules by mouth daily. Muscadine grape capsules - 650 mg each - take 2 capsules by mouth daily    [provider]  Pregnenolone Micronized (PREGNENOLONE PO) Take 75 mg by mouth daily.    [provider]  Probiotic Product (PROBIOTIC PO) Take 1 capsule by mouth daily. 20 billion    [provider]  QUERCETIN PO Take 1 capsule by mouth daily. Bio-Quercetin    [provider]  sildenafil (REVATIO) 20 MG tablet TAKE TWO TO FIVE TABLETS BY MOUTH 1 HOUR PRIOR TO INTERCOURSE 04/10/21   Stoioff, Ronda Fairly, MD  testosterone cypionate (DEPOTESTOSTERONE CYPIONATE) 200 MG/ML injection Once a week 12/11/20   [provider]  Turmeric 500 MG TABS Take 500 mg by mouth daily.    [provider]  Zinc 50 MG TABS Take 50 mg by mouth daily.    [provider]      Allergies    Ace inhibitors, Beta adrenergic blockers, Codeine, Lisinopril, and Statins    Review of Systems   Review of Systems  Constitutional:  Negative for fever.  Eyes:  Positive for photophobia, pain and redness. Negative for discharge and itching.   All other systems reviewed and are negative.   Physical Exam Updated Vital Signs BP (!) 170/93   Pulse 77   Temp 98.4 F (36.9 C)   Resp 18   Ht '5\' 7"'$  (1.702 m)   Wt 99.6 kg   SpO2 100%   BMI 34.39 kg/m  Physical Exam Vitals and nursing note reviewed.  Constitutional:      Appearance: Normal appearance. He is not ill-appearing.  HENT:     Head: Normocephalic and atraumatic.  Eyes:     General: Lids are normal. Lids are everted, no foreign bodies appreciated. Vision grossly intact. Gaze aligned appropriately. No scleral icterus.       Right eye: No foreign body.        Left eye: No foreign body.     Extraocular Movements: Extraocular movements intact.     Conjunctiva/sclera:     Right eye: Right conjunctiva is injected. No chemosis, exudate or hemorrhage.    Left eye: Left conjunctiva is not injected.     Pupils: Pupils are equal, round, and reactive to light.     Right eye: Corneal abrasion and fluorescein uptake present.     Slit lamp exam:    Right eye: Photophobia present. No corneal ulcer, foreign body, hyphema or hypopyon.     Left eye: No foreign body, hyphema or photophobia.  Pulmonary:     Effort: Pulmonary effort is normal. No respiratory distress.  Skin:    Findings: No rash.  Neurological:     General: No focal deficit present.     Mental Status: He is alert.  Psychiatric:        Mood and Affect: Mood normal.        Behavior: Behavior normal.        Thought Content: Thought content normal.        Judgment: Judgment normal.   ED Results / Procedures / Treatments   Labs (all labs ordered are listed, but only abnormal results are displayed) Labs Reviewed - No data to display  EKG None  Radiology No results found.  Procedures Procedures   Medications Ordered in ED Medications  tetracaine (PONTOCAINE) 0.5 % ophthalmic solution 2 drop (2 drops Right Eye Given by Other 08/03/21 1647)  fluorescein ophthalmic strip 1 strip (1 strip Right Eye Given by  Other 08/03/21 1648)    ED Course/ Medical Decision Making/ A&P                           Medical Decision Making Risk Prescription drug management.  This patient presents to the ED for concern of eye pain, this involves an extensive number of treatment options, and is a complaint that carries with it a high risk  of complications and morbidity.  The differential diagnosis includes corneal ulcer, corneal abrasion, retained foreign body, globe rupture, superimposed infection, conjunctivitis, etc.  Co morbidities that complicate the patient evaluation None  Additional history obtained:  Additional history obtained from: None External records from outside source obtained and reviewed including: None  Medications  I ordered medication including tetracaine and fluorescein for eye exam Reevaluation of the patient after medication shows that patient  N/A -I reviewed the patient's home medications and did not make adjustments. -I did  prescribe new home medications.  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 On physical exam he does have an injected right eye without chemosis.  There is no hyphema, hypopyon, periorbital cellulitis or swelling, globe rupture.  He has no sensation of proptosis no objective proptosis.  No cataracts. I fully evaluated the eye including everting the lids and did not see any current foreign bodies.  He is otherwise well-appearing. Used tetracaine which actually improved his symptoms.  Additionally used a fluorescein staining with Sherral Hammers lamp which showed a corneal abrasion to the right eye.  No evidence of a corneal ulcer, HSV keratitis with dendritic patterns.  Prescribed ciprofloxacin eyedrops.  Instructed the patient to use 4 times a day over the next 5 days.  I offered a saline rinse  which he declined.  He does not wear contacts or glasses.  Instructed not to use an eye shield.  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  He verbalized understanding.  Do not feel that he needs ophthalmology follow-up but instructed to contact an ophthalmologist if he has ongoing symptoms.  Instructed return if he gets have fever, worsening swelling around the eye, change in vision.  After consideration of the diagnostic results and the patients response to treatment, I feel that the patent would benefit from discharge. The patient has been appropriately medically screened and/or stabilized in the ED. I have low suspicion for any other emergent medical condition which would require further screening, evaluation or treatment in the ED or require inpatient management. The patient is overall well appearing and non-toxic in appearance. They are hemodynamically stable at time of discharge.   Final Clinical Impression(s) / ED Diagnoses Final diagnoses:  Abrasion of right cornea, initial encounter    Rx / DC Orders ED Discharge Orders          Ordered    ciprofloxacin (CILOXAN) 0.3 % ophthalmic solution  4 times daily        08/03/21 1646              Mickie Hillier, PA-C 08/04/21 1241    Dorie Rank, MD 08/04/21 1539

## 2021-08-03 NOTE — Discharge Instructions (Signed)
You were seen in the emergency department today for corneal abrasion.  I am prescribing you antibiotic eyedrops that you will use 4 times a day over the next 5 days.  Please return if begin to have fevers or changes to your vision or swelling of your eye.

## 2021-08-31 ENCOUNTER — Other Ambulatory Visit: Payer: Self-pay | Admitting: Urology

## 2021-11-27 ENCOUNTER — Encounter (HOSPITAL_BASED_OUTPATIENT_CLINIC_OR_DEPARTMENT_OTHER): Payer: Self-pay

## 2021-11-27 ENCOUNTER — Emergency Department (HOSPITAL_BASED_OUTPATIENT_CLINIC_OR_DEPARTMENT_OTHER): Payer: Medicare Other | Admitting: Radiology

## 2021-11-27 ENCOUNTER — Inpatient Hospital Stay (HOSPITAL_BASED_OUTPATIENT_CLINIC_OR_DEPARTMENT_OTHER)
Admission: EM | Admit: 2021-11-27 | Discharge: 2021-12-11 | DRG: 234 | Disposition: A | Discharge status: Home / Self Care | Payer: Medicare Other | Attending: Thoracic Surgery (Cardiothoracic Vascular Surgery) | Admitting: Thoracic Surgery (Cardiothoracic Vascular Surgery)

## 2021-11-27 ENCOUNTER — Other Ambulatory Visit: Payer: Self-pay

## 2021-11-27 ENCOUNTER — Telehealth: Payer: Self-pay | Admitting: Cardiovascular Disease

## 2021-11-27 DIAGNOSIS — I2510 Atherosclerotic heart disease of native coronary artery without angina pectoris: Secondary | ICD-10-CM | POA: Diagnosis not present

## 2021-11-27 DIAGNOSIS — Z955 Presence of coronary angioplasty implant and graft: Secondary | ICD-10-CM | POA: Diagnosis not present

## 2021-11-27 DIAGNOSIS — J9811 Atelectasis: Secondary | ICD-10-CM | POA: Diagnosis not present

## 2021-11-27 DIAGNOSIS — I252 Old myocardial infarction: Secondary | ICD-10-CM

## 2021-11-27 DIAGNOSIS — E039 Hypothyroidism, unspecified: Secondary | ICD-10-CM | POA: Diagnosis present

## 2021-11-27 DIAGNOSIS — Z1152 Encounter for screening for COVID-19: Secondary | ICD-10-CM | POA: Diagnosis not present

## 2021-11-27 DIAGNOSIS — I459 Conduction disorder, unspecified: Secondary | ICD-10-CM | POA: Diagnosis not present

## 2021-11-27 DIAGNOSIS — I10 Essential (primary) hypertension: Secondary | ICD-10-CM | POA: Diagnosis present

## 2021-11-27 DIAGNOSIS — R0602 Shortness of breath: Secondary | ICD-10-CM | POA: Diagnosis not present

## 2021-11-27 DIAGNOSIS — Z823 Family history of stroke: Secondary | ICD-10-CM

## 2021-11-27 DIAGNOSIS — I083 Combined rheumatic disorders of mitral, aortic and tricuspid valves: Secondary | ICD-10-CM | POA: Diagnosis not present

## 2021-11-27 DIAGNOSIS — R197 Diarrhea, unspecified: Secondary | ICD-10-CM | POA: Diagnosis not present

## 2021-11-27 DIAGNOSIS — Z809 Family history of malignant neoplasm, unspecified: Secondary | ICD-10-CM | POA: Diagnosis not present

## 2021-11-27 DIAGNOSIS — Z6834 Body mass index (BMI) 34.0-34.9, adult: Secondary | ICD-10-CM | POA: Diagnosis not present

## 2021-11-27 DIAGNOSIS — Z8249 Family history of ischemic heart disease and other diseases of the circulatory system: Secondary | ICD-10-CM | POA: Diagnosis not present

## 2021-11-27 DIAGNOSIS — I973 Postprocedural hypertension: Secondary | ICD-10-CM | POA: Diagnosis not present

## 2021-11-27 DIAGNOSIS — I25110 Atherosclerotic heart disease of native coronary artery with unstable angina pectoris: Secondary | ICD-10-CM | POA: Diagnosis not present

## 2021-11-27 DIAGNOSIS — I700 Atherosclerosis of aorta: Secondary | ICD-10-CM | POA: Diagnosis present

## 2021-11-27 DIAGNOSIS — G4733 Obstructive sleep apnea (adult) (pediatric): Secondary | ICD-10-CM | POA: Diagnosis not present

## 2021-11-27 DIAGNOSIS — R079 Chest pain, unspecified: Secondary | ICD-10-CM | POA: Diagnosis not present

## 2021-11-27 DIAGNOSIS — I25119 Atherosclerotic heart disease of native coronary artery with unspecified angina pectoris: Secondary | ICD-10-CM | POA: Diagnosis not present

## 2021-11-27 DIAGNOSIS — Z01810 Encounter for preprocedural cardiovascular examination: Secondary | ICD-10-CM | POA: Diagnosis not present

## 2021-11-27 DIAGNOSIS — I160 Hypertensive urgency: Secondary | ICD-10-CM | POA: Diagnosis not present

## 2021-11-27 DIAGNOSIS — Z79890 Hormone replacement therapy (postmenopausal): Secondary | ICD-10-CM

## 2021-11-27 DIAGNOSIS — R7303 Prediabetes: Secondary | ICD-10-CM | POA: Diagnosis not present

## 2021-11-27 DIAGNOSIS — T82855A Stenosis of coronary artery stent, initial encounter: Secondary | ICD-10-CM | POA: Diagnosis not present

## 2021-11-27 DIAGNOSIS — I119 Hypertensive heart disease without heart failure: Secondary | ICD-10-CM | POA: Diagnosis not present

## 2021-11-27 DIAGNOSIS — E7800 Pure hypercholesterolemia, unspecified: Secondary | ICD-10-CM | POA: Diagnosis not present

## 2021-11-27 DIAGNOSIS — Z452 Encounter for adjustment and management of vascular access device: Secondary | ICD-10-CM | POA: Diagnosis not present

## 2021-11-27 DIAGNOSIS — Y713 Surgical instruments, materials and cardiovascular devices (including sutures) associated with adverse incidents: Secondary | ICD-10-CM | POA: Diagnosis present

## 2021-11-27 DIAGNOSIS — Z8546 Personal history of malignant neoplasm of prostate: Secondary | ICD-10-CM

## 2021-11-27 DIAGNOSIS — Z951 Presence of aortocoronary bypass graft: Secondary | ICD-10-CM | POA: Diagnosis not present

## 2021-11-27 DIAGNOSIS — Z888 Allergy status to other drugs, medicaments and biological substances status: Secondary | ICD-10-CM

## 2021-11-27 DIAGNOSIS — Z9889 Other specified postprocedural states: Secondary | ICD-10-CM | POA: Diagnosis not present

## 2021-11-27 DIAGNOSIS — Z79899 Other long term (current) drug therapy: Secondary | ICD-10-CM

## 2021-11-27 DIAGNOSIS — Z885 Allergy status to narcotic agent status: Secondary | ICD-10-CM

## 2021-11-27 DIAGNOSIS — I517 Cardiomegaly: Secondary | ICD-10-CM | POA: Diagnosis not present

## 2021-11-27 DIAGNOSIS — Z822 Family history of deafness and hearing loss: Secondary | ICD-10-CM

## 2021-11-27 DIAGNOSIS — E669 Obesity, unspecified: Secondary | ICD-10-CM | POA: Diagnosis not present

## 2021-11-27 DIAGNOSIS — I214 Non-ST elevation (NSTEMI) myocardial infarction: Secondary | ICD-10-CM | POA: Diagnosis not present

## 2021-11-27 DIAGNOSIS — Z820 Family history of epilepsy and other diseases of the nervous system: Secondary | ICD-10-CM | POA: Diagnosis not present

## 2021-11-27 DIAGNOSIS — Z01818 Encounter for other preprocedural examination: Secondary | ICD-10-CM | POA: Diagnosis not present

## 2021-11-27 DIAGNOSIS — G4730 Sleep apnea, unspecified: Secondary | ICD-10-CM | POA: Diagnosis not present

## 2021-11-27 LAB — CBC
HCT: 52 % (ref 39.0–52.0)
Hemoglobin: 18.1 g/dL — ABNORMAL HIGH (ref 13.0–17.0)
MCH: 29.3 pg (ref 26.0–34.0)
MCHC: 34.8 g/dL (ref 30.0–36.0)
MCV: 84.3 fL (ref 80.0–100.0)
Platelets: 320 10*3/uL (ref 150–400)
RBC: 6.17 MIL/uL — ABNORMAL HIGH (ref 4.22–5.81)
RDW: 13.7 % (ref 11.5–15.5)
WBC: 7.9 10*3/uL (ref 4.0–10.5)
nRBC: 0 % (ref 0.0–0.2)

## 2021-11-27 LAB — TROPONIN I (HIGH SENSITIVITY)
Troponin I (High Sensitivity): 397 ng/L (ref <18)
Troponin I (High Sensitivity): 610 ng/L (ref <18)
Troponin I (High Sensitivity): 696 ng/L (ref <18)

## 2021-11-27 LAB — BASIC METABOLIC PANEL
Anion gap: 11 (ref 5–15)
BUN: 14 mg/dL (ref 8–23)
CO2: 22 mmol/L (ref 22–32)
Calcium: 9.4 mg/dL (ref 8.9–10.3)
Chloride: 101 mmol/L (ref 98–111)
Creatinine, Ser: 0.85 mg/dL (ref 0.61–1.24)
GFR, Estimated: >60 mL/min (ref >60)
Glucose, Bld: 95 mg/dL (ref 70–99)
Potassium: 3.9 mmol/L (ref 3.5–5.1)
Sodium: 134 mmol/L — ABNORMAL LOW (ref 135–145)

## 2021-11-27 MED ORDER — HEPARIN BOLUS VIA INFUSION
4000.0000 [IU] | Freq: Once | INTRAVENOUS | Status: AC
  Administered 2021-11-27: 4000 [IU] via INTRAVENOUS

## 2021-11-27 MED ORDER — HEPARIN (PORCINE) 25000 UT/250ML-% IV SOLN
1600.0000 [IU]/h | INTRAVENOUS | Status: DC
  Administered 2021-11-27: 1100 [IU]/h via INTRAVENOUS
  Administered 2021-11-28: 1400 [IU]/h via INTRAVENOUS
  Administered 2021-11-29: 1600 [IU]/h via INTRAVENOUS
  Filled 2021-11-27 (×2): qty 250

## 2021-11-27 NOTE — ED Provider Notes (Addendum)
[facility] EMERGENCY DEPT Provider Note   CSN: [id number] Arrival date & time: 11/27/21  1648     History  Chief Complaint  Patient presents with   Chest Pain   Shortness of Breath    Keith Murillo is a 71 y.o. male.  HPI 71 year old male presents with chest pain.  Its described as primarily a tightness in his chest.  He has been dealing with this type of discomfort whenever he exerts himself such as picking up heavy luggage, walking up the stairs, etc.  This been ongoing and probably worsening for the last 2+ months.  However last night it took him an hour for his chest discomfort to go away, even then it was after he took aspirin.  He had walked up the stairs and went to the bed but it did not go away.  Normally goes away within minutes of resting.  Today around 4 PM he developed recurrent chest tightness and discomfort but this time it did not start with exertion but rather he was at rest watching his granddaughter.  It has not fully gone away the did get somewhat better after taking 325 mg aspirin.  No significant shortness of breath.  The pain is primarily left chest and goes into his left shoulder but not down his arm.  Right now it is described as about a 6 out of 10.  He takes sildenafil and he most recently took this last night.  Home Medications Prior to Admission medications   Medication Sig Start Date End Date Taking? Authorizing Provider  Cholecalciferol (VITAMIN D-3) 125 MCG (5000 UT) TABS Take 5,000 Units by mouth daily.     [provider]  Coenzyme Q10 ([employer]S COQ-10) 200 MG capsule Take 200 mg by mouth daily.    [provider]  CRANBERRY PO Take 1 capsule by mouth daily.    [provider]  DHEA 25 MG CAPS Take 25 mg by mouth daily.     [provider]  Edetate Calcium Disodium POWD Take 600 mg by mouth daily. Calcium Disodium EDTA    [provider]  ibuprofen (ADVIL) 800 MG tablet Take 1 tablet (800 mg  total) by mouth every 8 (eight) hours as needed. 09/22/19   Cornett, [name], MD  loperamide (IMODIUM A-D) 2 MG tablet Take 1 tablet (2 mg total) by mouth 4 (four) times daily as needed for diarrhea or loose stools. Patient not taking: Reported on 07/30/2021 01/04/21   Fredia Sorrow, MD  Magnesium Citrate POWD Take 1 Scoop by mouth daily as needed (regularity).    [provider]  melatonin 5 MG TABS Take 5 mg by mouth at bedtime.    [provider]  Menaquinone-7 (VITAMIN K2 PO) Take 150 mcg by mouth daily. MK-7    [provider]  Methylcobalamin 2500 MCG SUBL Take 5,000 mcg by mouth daily.    [provider]  Multiple Vitamin (MULTIVITAMIN WITH MINERALS) TABS tablet Take 1 tablet by mouth daily. Men's Multivitamin by Purity    [provider]  Multiple Vitamins-Minerals (IMMUNE SUPPORT PO) Take 520 mg by mouth daily. Vitac-ld Liposomal Vitamin    [provider]  NON FORMULARY Take 1 capsule by mouth daily. HAIOTB Circulation Syn3rgy (Beet Root, L-Arginine, Horse Chestnut)    [provider]  NP THYROID 60 MG tablet Take 60 mg by mouth every morning. 12/03/20   [provider]  OVER THE COUNTER MEDICATION Take 2 capsules by mouth daily. Muscadine grape  capsules - 650 mg each - take 2 capsules by mouth daily    [provider]  Pregnenolone Micronized (PREGNENOLONE PO) Take 75 mg by mouth daily.    [provider]  Probiotic Product (PROBIOTIC PO) Take 1 capsule by mouth daily. 20 billion    [provider]  QUERCETIN PO Take 1 capsule by mouth daily. Bio-Quercetin    [provider]  sildenafil (REVATIO) 20 MG tablet take 2 to 5 tablets by mouth 1 hour prior to intercourse 09/02/21   Abbie Sons, MD  testosterone cypionate (DEPOTESTOSTERONE CYPIONATE) 200 MG/ML injection Once a week 12/11/20   [provider]  Turmeric 500 MG TABS Take 500 mg by mouth daily.    [provider]  Zinc 50 MG TABS Take 50 mg by mouth daily.    [provider]      Allergies    Ace inhibitors, Beta adrenergic blockers, Codeine, Lisinopril, and Statins    Review of Systems   Review of Systems  Respiratory:  Positive for chest tightness. Negative for shortness of breath.     Physical Exam Updated Vital Signs BP (!) 159/91   Pulse 62   Temp 98.5 F (36.9 C) (Oral)   Resp 15   Ht 5' 7.01" (1.702 m)   Wt 99.8 kg   SpO2 99%   BMI 34.45 kg/m  Physical Exam Vitals and nursing note reviewed.  Constitutional:      General: He is not in acute distress.    Appearance: He is well-developed. He is not ill-appearing or diaphoretic.  HENT:     Head: Normocephalic and atraumatic.  Cardiovascular:     Rate and Rhythm: Normal rate and regular rhythm.     Heart sounds: Normal heart sounds.  Pulmonary:     Effort: Pulmonary effort is normal.     Breath sounds: Normal breath sounds.  Abdominal:     Palpations: Abdomen is soft.     Tenderness: There is no abdominal tenderness.  Skin:    General: Skin is warm and dry.  Neurological:     Mental Status: He is alert.     ED Results / Procedures / Treatments   Labs (all labs ordered are listed, but only abnormal results are displayed) Labs Reviewed  BASIC METABOLIC PANEL - Abnormal; Notable for the following components:      Result Value   Sodium 134 (*)    All other components within normal limits  CBC - Abnormal; Notable for the following components:   RBC 6.17 (*)    Hemoglobin 18.1 (*)    All other components within normal limits  TROPONIN I (HIGH SENSITIVITY) - Abnormal; Notable for the following components:   Troponin I (High Sensitivity) 397 (*)    All other components within normal limits  TROPONIN I (HIGH SENSITIVITY) - Abnormal; Notable for the following components:   Troponin I (High Sensitivity) 610 (*)    All other components within normal limits  HEPARIN LEVEL (UNFRACTIONATED)  CBC   TROPONIN I (HIGH SENSITIVITY)    EKG EKG Interpretation  Date/Time:  Wednesday November 27 2021 18:01:08 EST Ventricular Rate:  61 PR Interval:  148 QRS Duration: 100 QT Interval:  433 QTC Calculation: 437 R Axis:   52 Text Interpretation: Sinus rhythm rate is slower, but no new ST/T changes when compared to Dec 2022 Confirmed by Sherwood Gambler [phone]) on 11/27/2021 6:04:18 PM  Radiology DG Chest 2 View  Result Date: 11/27/2021 CLINICAL DATA:  Left-sided chest pain. EXAM: CHEST - 2 VIEW COMPARISON:  01/04/2021 radiograph and CT FINDINGS: The cardiomediastinal contours are normal. The lungs are clear. Pulmonary vasculature is normal. No consolidation, pleural effusion, or pneumothorax. No acute osseous abnormalities are seen. IMPRESSION: No acute chest findings. Electronically Signed   By: Keith Rake M.D.   On: 11/27/2021 18:03    Procedures .Critical Care  Performed by: Sherwood Gambler, MD Authorized by: Sherwood Gambler, MD   Critical care provider statement:    Critical care time (minutes):  35   Critical care time was exclusive of:  Separately billable procedures and treating other patients   Critical care was necessary to treat or prevent imminent or life-threatening deterioration of the following conditions:  Cardiac failure   Critical care was time spent personally by me on the following activities:  Development of treatment plan with patient or surrogate, discussions with consultants, evaluation of patient's response to treatment, examination of patient, ordering and review of laboratory studies, ordering and review of radiographic studies, ordering and performing treatments and interventions, pulse oximetry, re-evaluation of patient's condition and review of old charts     Medications Ordered in ED Medications  heparin ADULT infusion 100 units/mL (25000 units/269m) (1,100 Units/hr Intravenous New Bag/Given 11/27/21 1824)  heparin bolus via infusion 4,000 Units  (4,000 Units Intravenous Bolus from Bag 11/27/21 1822)    ED Course/ Medical Decision Making/ A&P                           Medical Decision Making Amount and/or Complexity of Data Reviewed Labs: ordered.    Details: Troponin of 397 consistent with NSTEMI Radiology: ordered and independent interpretation performed.    Details: No CHF ECG/medicine tests: ordered and independent interpretation performed.    Details: No acute ischemia  Risk Prescription drug management. Decision regarding hospitalization.   Patient presents with an NSTEMI.  Pain is mild to moderate currently though improving.  He took aspirin prior to arrival.  Given he took sildenafil last night I do not think nitroglycerin is a good idea.  Discussed this with Dr. JMartiniquewho agrees to hold off on nitro.  Otherwise, he will need admission to the cardiology service.  Low suspicion this is from PE or dissection.   11:07 PM [facility]     Final Clinical Impression(s) / ED Diagnoses Final diagnoses:  NSTEMI (non-ST elevated myocardial infarction) ([facility]    Rx / DC Orders ED Discharge Orders     None         GSherwood Gambler MD 11/27/21 2055    GSherwood Gambler MD 11/27/21 [phone]

## 2021-11-27 NOTE — ED Notes (Signed)
CRITICAL VALUE STICKER  CRITICAL VALUE:Troponin 160  FUXNATFT (on-site recipient of call):Shawnie Pons, RN  DATE & TIME NOTIFIED:   MESSENGER (representative from lab):  MD NOTIFIED: Dr. Regenia Skeeter  TIME OF NOTIFICATION:1757  RESPONSE:

## 2021-11-27 NOTE — ED Notes (Signed)
Called [facility] for Transfer to [facility] '@8'$ :33pm.  Spoke with [employer]

## 2021-11-27 NOTE — ED Triage Notes (Signed)
Pt c/o L sided CP/ tightness "again," reports same last night that resolved after rest. Reports appt w cards Monday 4 stents, reports 324 ASA just PTA

## 2021-11-27 NOTE — ED Notes (Signed)
Called [facility]; spoke to [place], made aware of the patient requesting a bed for admission

## 2021-11-27 NOTE — Telephone Encounter (Signed)
Pt c/o of Chest Pain: STAT if CP now or developed within 24 hours  1. Are you having CP right now? No   2. Are you experiencing any other symptoms (ex. SOB, nausea, vomiting, sweating)? Fatigue, sweating at night while sleep   3. How long have you been experiencing CP? Over the last couples of months when exercising it would hurt for few minutes then go away. However last night it was worse and it hurt a little bit this morning but it went away.   4. Is your CP continuous or coming and going? Coming and going   5. Have you taken Nitroglycerin? No, but pt chewed an aspirin last night and that helped.    Pt states he tested positive for covid about a week and a half ago and he has been coughing with some sinus issues.

## 2021-11-27 NOTE — Telephone Encounter (Signed)
Called and spoke with patient. He stated that last night while he was walking downstairs to grab his cellphone and then while walking back upstairs a severe chest pain started that lasted for about an hour. He took a baby aspirin which he stated seemed to help.  After reviewing patients history of CAD, and previous MI, I instructed him to go to the ED if CP returns, or he develops any other symptoms. Reviewed ED precautions. Scheduled patient to see Dr. Rockey Situ on Monday 12/02/21.  Patient verbalized understanding and agreed with plan.

## 2021-11-27 NOTE — Progress Notes (Signed)
ANTICOAGULATION CONSULT NOTE - Initial Consult  Pharmacy Consult for heparin Indication: chest pain/ACS  Allergies  Allergen Reactions   Ace Inhibitors     cough   Beta Adrenergic Blockers     Fatigue   Codeine Other (See Comments)    Other reaction(s): Unknown   Lisinopril Cough   Statins     myalgias    Patient Measurements: Height: 5' 7.01" (170.2 cm) Weight: 99.8 kg (220 lb) IBW/kg (Calculated) : 66.12 Heparin Dosing Weight: 87.8 kg   Vital Signs: Temp: 98.2 F (36.8 C) (11/22 1709) BP: 159/91 (11/22 1730) Pulse Rate: 62 (11/22 1730)  Labs: Recent Labs    11/27/21 1700  HGB 18.1*  HCT 52.0  PLT 320  CREATININE 0.85  TROPONINIHS 397*    CrCl cannot be calculated (Unknown ideal weight.).   Medical History: Past Medical History:  Diagnosis Date   Cancer [facility])    prostate   Chest pain    Complication of anesthesia    fighting with waking up   Coronary artery disease    Heart attack [facility])    Hypercholesterolemia    Hyperlipidemia    Hypertension    Ischemic heart disease September 2004   Known--with non-Q-wave myocardial infarction    Obesity    Sleep apnea    Assessment: 71 year old male presenting with chest pain. Has history of NSTEMI (2004) s/p stents to RCA (2004) and LAD (2009). No history of anticoagulation PTA. Pharmacy has been consulted to dose heparin.  Hgb 18.1, platelets are WNL.   Goal of Therapy:  Heparin level 0.3-0.7 units/ml Monitor platelets by anticoagulation protocol: Yes   Plan: Give heparin 4000 units x1 Start heparin infusion at 1100 units/hour Check 8-hour heparin level Monitor daily heparin levels, CBC, and signs/symptoms of bleeding  Louanne Belton, PharmD, [facility] PGY1 Pharmacy Resident 11/27/2021 6:07 PM

## 2021-11-28 ENCOUNTER — Inpatient Hospital Stay (HOSPITAL_COMMUNITY): Payer: Medicare Other

## 2021-11-28 DIAGNOSIS — I214 Non-ST elevation (NSTEMI) myocardial infarction: Secondary | ICD-10-CM

## 2021-11-28 LAB — ECHOCARDIOGRAM COMPLETE
Area-P 1/2: 2.69 cm2
Height: 68 in
S' Lateral: 2.8 cm
Weight: 3560 oz

## 2021-11-28 LAB — LIPID PANEL
Cholesterol: 179 mg/dL (ref 0–200)
HDL: 33 mg/dL — ABNORMAL LOW (ref >40)
LDL Cholesterol: 82 mg/dL (ref 0–99)
Total CHOL/HDL Ratio: 5.4 RATIO
Triglycerides: 322 mg/dL — ABNORMAL HIGH (ref <150)
VLDL: 64 mg/dL — ABNORMAL HIGH (ref 0–40)

## 2021-11-28 LAB — CBC
HCT: 51.5 % (ref 39.0–52.0)
Hemoglobin: 18.2 g/dL — ABNORMAL HIGH (ref 13.0–17.0)
MCH: 29.6 pg (ref 26.0–34.0)
MCHC: 35.3 g/dL (ref 30.0–36.0)
MCV: 83.7 fL (ref 80.0–100.0)
Platelets: 315 10*3/uL (ref 150–400)
RBC: 6.15 MIL/uL — ABNORMAL HIGH (ref 4.22–5.81)
RDW: 13.2 % (ref 11.5–15.5)
WBC: 7.8 10*3/uL (ref 4.0–10.5)
nRBC: 0 % (ref 0.0–0.2)

## 2021-11-28 LAB — TROPONIN I (HIGH SENSITIVITY)
Troponin I (High Sensitivity): 741 ng/L (ref <18)
Troponin I (High Sensitivity): 3797 ng/L (ref <18)
Troponin I (High Sensitivity): 4207 ng/L (ref <18)

## 2021-11-28 LAB — HEPARIN LEVEL (UNFRACTIONATED)
Heparin Unfractionated: 0.16 IU/mL — ABNORMAL LOW (ref 0.30–0.70)
Heparin Unfractionated: 0.39 IU/mL (ref 0.30–0.70)
Heparin Unfractionated: 0.3 IU/mL (ref 0.30–0.70)

## 2021-11-28 LAB — TSH: TSH: 3.416 u[IU]/mL (ref 0.350–4.500)

## 2021-11-28 LAB — HIV ANTIBODY (ROUTINE TESTING W REFLEX): HIV Screen 4th Generation wRfx: NONREACTIVE

## 2021-11-28 MED ORDER — LOSARTAN POTASSIUM 25 MG PO TABS
25.0000 mg | ORAL_TABLET | Freq: Every day | ORAL | Status: DC
  Administered 2021-11-28 – 2021-11-30 (×3): 25 mg via ORAL
  Filled 2021-11-28 (×3): qty 1

## 2021-11-28 MED ORDER — EZETIMIBE 10 MG PO TABS
10.0000 mg | ORAL_TABLET | Freq: Every day | ORAL | Status: DC
  Administered 2021-11-28 – 2021-12-05 (×8): 10 mg via ORAL
  Filled 2021-11-28 (×8): qty 1

## 2021-11-28 MED ORDER — ASPIRIN 81 MG PO CHEW: 81.0000 mg | CHEWABLE_TABLET | ORAL | Status: DC

## 2021-11-28 MED ORDER — SODIUM CHLORIDE 0.9% FLUSH: 3.0000 mL | INTRAVENOUS | Status: DC | PRN

## 2021-11-28 MED ORDER — NITROGLYCERIN 0.4 MG SL SUBL: 0.4000 mg | SUBLINGUAL_TABLET | SUBLINGUAL | Status: DC | PRN

## 2021-11-28 MED ORDER — MELATONIN 5 MG PO TABS
5.0000 mg | ORAL_TABLET | Freq: Every day | ORAL | Status: DC
  Administered 2021-11-28 – 2021-12-05 (×8): 5 mg via ORAL
  Filled 2021-11-28 (×8): qty 1

## 2021-11-28 MED ORDER — PERFLUTREN LIPID MICROSPHERE
1.0000 mL | INTRAVENOUS | Status: AC | PRN
  Administered 2021-11-28: 6 mL via INTRAVENOUS

## 2021-11-28 MED ORDER — SODIUM CHLORIDE 0.9 % IV SOLN: 250.0000 mL | INTRAVENOUS | Status: DC | PRN

## 2021-11-28 MED ORDER — HYDRALAZINE HCL 25 MG PO TABS
25.0000 mg | ORAL_TABLET | Freq: Three times a day (TID) | ORAL | Status: DC
  Administered 2021-11-28: 25 mg via ORAL
  Filled 2021-11-28: qty 1

## 2021-11-28 MED ORDER — THYROID 60 MG PO TABS
60.0000 mg | ORAL_TABLET | Freq: Every morning | ORAL | Status: DC
  Administered 2021-11-28 – 2021-12-05 (×8): 60 mg via ORAL
  Filled 2021-11-28 (×9): qty 1

## 2021-11-28 MED ORDER — VITAMIN B-12 1000 MCG PO TABS
5000.0000 ug | ORAL_TABLET | Freq: Every day | ORAL | Status: DC
  Administered 2021-11-28 – 2021-12-05 (×8): 5000 ug via ORAL
  Filled 2021-11-28 (×8): qty 5

## 2021-11-28 MED ORDER — HEPARIN BOLUS VIA INFUSION
2000.0000 [IU] | Freq: Once | INTRAVENOUS | Status: AC
  Administered 2021-11-28: 2000 [IU] via INTRAVENOUS
  Filled 2021-11-28: qty 2000

## 2021-11-28 MED ORDER — ONDANSETRON HCL 4 MG/2ML IJ SOLN: 4.0000 mg | Freq: Four times a day (QID) | INTRAMUSCULAR | Status: DC | PRN

## 2021-11-28 MED ORDER — SODIUM CHLORIDE 0.9% FLUSH: 3.0000 mL | Freq: Two times a day (BID) | INTRAVENOUS | Status: DC

## 2021-11-28 MED ORDER — COENZYME Q10 200 MG PO CAPS: 200.0000 mg | ORAL_CAPSULE | Freq: Every day | ORAL | Status: DC

## 2021-11-28 MED ORDER — ACETAMINOPHEN 325 MG PO TABS
650.0000 mg | ORAL_TABLET | ORAL | Status: DC | PRN
  Administered 2021-11-28 – 2021-11-29 (×3): 650 mg via ORAL
  Filled 2021-11-28 (×3): qty 2

## 2021-11-28 MED ORDER — ASPIRIN 81 MG PO TBEC
81.0000 mg | DELAYED_RELEASE_TABLET | Freq: Every day | ORAL | Status: DC
  Administered 2021-11-30 – 2021-12-05 (×6): 81 mg via ORAL
  Filled 2021-11-28 (×6): qty 1

## 2021-11-28 MED ORDER — ISOSORBIDE MONONITRATE ER 60 MG PO TB24
60.0000 mg | ORAL_TABLET | Freq: Every day | ORAL | Status: DC
  Administered 2021-11-28 – 2021-12-05 (×8): 60 mg via ORAL
  Filled 2021-11-28 (×8): qty 1

## 2021-11-28 MED ORDER — SODIUM CHLORIDE 0.9 % WEIGHT BASED INFUSION: 1.0000 mL/kg/h | INTRAVENOUS | Status: DC

## 2021-11-28 MED ORDER — SODIUM CHLORIDE 0.9 % WEIGHT BASED INFUSION
3.0000 mL/kg/h | INTRAVENOUS | Status: DC
  Administered 2021-11-29: 3 mL/kg/h via INTRAVENOUS

## 2021-11-28 NOTE — H&P (Addendum)
Cardiology Admission History and Physical   Patient ID: Keith Murillo MRN: [id number]; DOB: 03-28-1950   Admission date: 11/27/2021  PCP:  [name], [place] [facility] Cardiologist:  Ida Rogue, MD        Chief Complaint:  CP  Patient Profile:   Keith Murillo is a 71 y.o. male with a past medical history of CAD s/p remote PCI to LAD and RCA (2004, 2009, 4 stents total), HLD, hypothyroidism, OSA not on CPAP, on testosterone supplement and hx of prostate cancer who is being seen 11/28/2021 for the evaluation of CP.  History of Present Illness:   Keith Murillo states he felt sudden onset left sided chest tightness (8/10 on severity) radiating to his left arm last night while he was walking upstairs. He took a baby ASA which resolved his chest pain in approximately one hour. He had another episode yesterday afternoon while watching his granddaughter playing. He says the chest pain appears to be similar to the pain he had prior to his stent placement. Patient also states he has had very brief and similar episodes of chest tightness with exertion in the last 6 months. Denies fever, chills, dizziness, syncope, lightheadedness, heart palpitations, dyspnea, nausea, vomiting, PND, abdominal fullness, dysuria, diarrhea, pedal edema or any bleeding events.  Denies history of tobacco or alcohol abuse.  Currently he is on room air, no acute distress, chest pain-free on heparin drip.   Past Medical History:  Diagnosis Date   Cancer [facility])    prostate   Chest pain    Complication of anesthesia    fighting with waking up   Coronary artery disease    Heart attack [facility])    Hypercholesterolemia    Hyperlipidemia    Hypertension    Ischemic heart disease September 2004   Known--with non-Q-wave myocardial infarction    Obesity    Sleep apnea     Past Surgical History:  Procedure Laterality Date   CARDIAC CATHETERIZATION  11/2007   INGUINAL HERNIA  REPAIR Right 09/22/2019   Procedure: RIGHT INGUINAL HERNIA WITH MESH;  Surgeon: Erroll Luna, MD;  Location: [place];  Service: General;  Laterality: Right;  TAP BLOCK   TEE WITHOUT CARDIOVERSION N/A 03/16/2018   Procedure: TRANSESOPHAGEAL ECHOCARDIOGRAM (TEE);  Surgeon: Acie Fredrickson Wonda Cheng, MD;  Location: [facility] ENDOSCOPY;  Service: Cardiovascular;  Laterality: N/A;     Medications Prior to Admission: Prior to Admission medications   Medication Sig Start Date End Date Taking? Authorizing Provider  Cholecalciferol (VITAMIN D-3) 125 MCG (5000 UT) TABS Take 5,000 Units by mouth daily.     [provider]  Coenzyme Q10 ([employer] COQ-10) 200 MG capsule Take 200 mg by mouth daily.    [provider]  CRANBERRY PO Take 1 capsule by mouth daily.    [provider]  DHEA 25 MG CAPS Take 25 mg by mouth daily.     [provider]  Edetate Calcium Disodium POWD Take 600 mg by mouth daily. Calcium Disodium EDTA    [provider]  ibuprofen (ADVIL) 800 MG tablet Take 1 tablet (800 mg total) by mouth every 8 (eight) hours as needed. 09/22/19   Cornett, [name], MD  loperamide (IMODIUM A-D) 2 MG tablet Take 1 tablet (2 mg total) by mouth 4 (four) times daily as needed for diarrhea or loose stools. Patient not taking: Reported on 07/30/2021 01/04/21   Fredia Sorrow, MD  Magnesium Citrate POWD Take 1 Scoop by mouth  daily as needed (regularity).    [provider]  melatonin 5 MG TABS Take 5 mg by mouth at bedtime.    [provider]  Menaquinone-7 (VITAMIN K2 PO) Take 150 mcg by mouth daily. MK-7    [provider]  Methylcobalamin 2500 MCG SUBL Take 5,000 mcg by mouth daily.    [provider]  Multiple Vitamin (MULTIVITAMIN WITH MINERALS) TABS tablet Take 1 tablet by mouth daily. Men's Multivitamin by Purity    [provider]  Multiple Vitamins-Minerals (IMMUNE SUPPORT PO) Take 520 mg by mouth daily. Vitac-ld Liposomal Vitamin     [provider]  NON FORMULARY Take 1 capsule by mouth daily. HAIOTB Circulation Syn3rgy (Beet Root, L-Arginine, Horse Chestnut)    [provider]  NP THYROID 60 MG tablet Take 60 mg by mouth every morning. 12/03/20   [provider]  OVER THE COUNTER MEDICATION Take 2 capsules by mouth daily. Muscadine grape capsules - 650 mg each - take 2 capsules by mouth daily    [provider]  Pregnenolone Micronized (PREGNENOLONE PO) Take 75 mg by mouth daily.    [provider]  Probiotic Product (PROBIOTIC PO) Take 1 capsule by mouth daily. 20 billion    [provider]  QUERCETIN PO Take 1 capsule by mouth daily. Bio-Quercetin    [provider]  sildenafil (REVATIO) 20 MG tablet take 2 to 5 tablets by mouth 1 hour prior to intercourse 09/02/21   Abbie Sons, MD  testosterone cypionate (DEPOTESTOSTERONE CYPIONATE) 200 MG/ML injection Once a week 12/11/20   [provider]  Turmeric 500 MG TABS Take 500 mg by mouth daily.    [provider]  Zinc 50 MG TABS Take 50 mg by mouth daily.    [provider]     Allergies:    Allergies  Allergen Reactions   Ace Inhibitors     cough   Beta Adrenergic Blockers     Fatigue   Codeine Other (See Comments)    Other reaction(s): Unknown   Lisinopril Cough   Statins     myalgias    Social History:   Social History   Socioeconomic History   Marital status: Married    Spouse name: Not on file   Number of children: Not on file   Years of education: Not on file   Highest education level: Not on file  Occupational History   Not on file  Tobacco Use   Smoking status: Never   Smokeless tobacco: Never  Vaping Use   Vaping Use: Never used  Substance and Sexual Activity   Alcohol use: Yes    Comment: occas   Drug use: No   Sexual activity: Yes  Other Topics Concern   Not on file  Social History Narrative   Not on file   Social Determinants of Health    Financial Resource Strain: Not on file  Food Insecurity: Not on file  Transportation Needs: Not on file  Physical Activity: Not on file  Stress: Not on file  Social Connections: Not on file  Intimate Partner Violence: Not on file    Family History:   The patient's family history includes Cancer in his maternal grandmother and mother; Hearing loss in his father; Heart attack in his paternal grandfather; Parkinson's disease in his maternal grandfather; Stroke in his maternal grandfather and paternal grandmother.    ROS:  Please see the history of present illness.  All other ROS reviewed  and negative.     Physical Exam/Data:   Vitals:   11/27/21 1805 11/27/21 2052 11/27/21 2100 11/27/21 2338  BP:   (!) 172/99 (!) 167/98  Pulse:   70 60  Resp:   15 20  Temp:  98.5 F (36.9 C)  98.1 F (36.7 C)  TempSrc:  Oral  Oral  SpO2:   100% 97%  Weight: 99.8 kg     Height: 5' 7.01" (1.702 m)      No intake or output data in the 24 hours ending 11/28/21 0052    11/27/2021    6:05 PM 08/03/2021    2:03 PM 07/30/2021    8:33 AM  Last 3 Weights  Weight (lbs) 220 lb 219 lb 9.3 oz 219 lb 8 oz  Weight (kg) 99.791 kg 99.6 kg 99.565 kg     Body mass index is 34.45 kg/m.  General:  Well nourished, well developed, in no acute distress HEENT: normal Neck: no JVD Vascular: No carotid bruits; Distal pulses 2+ bilaterally   Cardiac:  normal S1, S2; RRR; no murmur  Lungs:  clear to auscultation bilaterally, no wheezing, rhonchi or rales  Abd: soft, nontender, no hepatomegaly  Ext: no edema Musculoskeletal:  No deformities, BUE and BLE strength normal and equal Skin: warm and dry  Neuro:  CNs 2-12 intact, no focal abnormalities noted Psych:  Normal affect    EKG:  The ECG that was done  was personally reviewed and demonstrates NSR  Relevant CV Studies:   Laboratory Data:  High Sensitivity Troponin:   Recent Labs  Lab 11/27/21 1700 11/27/21 1910 11/27/21 2135  TROPONINIHS 397*  610* 696*      Chemistry Recent Labs  Lab 11/27/21 1700  NA 134*  K 3.9  CL 101  CO2 22  GLUCOSE 95  BUN 14  CREATININE 0.85  CALCIUM 9.4  GFRNONAA >60  ANIONGAP 11    No results for input(s): "PROT", "ALBUMIN", "AST", "ALT", "ALKPHOS", "BILITOT" in the last 168 hours. Lipids No results for input(s): "CHOL", "TRIG", "HDL", "LABVLDL", "LDLCALC", "CHOLHDL" in the last 168 hours. Hematology Recent Labs  Lab 11/27/21 1700  WBC 7.9  RBC 6.17*  HGB 18.1*  HCT 52.0  MCV 84.3  MCH 29.3  MCHC 34.8  RDW 13.7  PLT 320   Thyroid No results for input(s): "TSH", "FREET4" in the last 168 hours. BNPNo results for input(s): "BNP", "PROBNP" in the last 168 hours.  DDimer No results for input(s): "DDIMER" in the last 168 hours.   Radiology/Studies:  DG Chest 2 View  Result Date: 11/27/2021 CLINICAL DATA:  Left-sided chest pain. EXAM: CHEST - 2 VIEW COMPARISON:  01/04/2021 radiograph and CT FINDINGS: The cardiomediastinal contours are normal. The lungs are clear. Pulmonary vasculature is normal. No consolidation, pleural effusion, or pneumothorax. No acute osseous abnormalities are seen. IMPRESSION: No acute chest findings. Electronically Signed   By: Keith Rake M.D.   On: 11/27/2021 18:03     Assessment and Plan:   #NSTEMI -troponins on admission were elevated. ECG demonstrated NSR without ST changes -risk stratification with A1C, TSH and lipid panel -acquire a TTE -start ASA -patient reports intolerant to beta-blocker and statins. Will address them after LHC -continue heparin gtt -NPO for now  #Hypothyroidism -continue home armour  Risk Assessment/Risk Scores:    TIMI Risk Score for Unstable Angina or Non-ST Elevation MI:   The patient's TIMI risk score is 6, which indicates a 41% risk of all cause mortality, new or recurrent  myocardial infarction or need for urgent revascularization in the next 14 days.    Severity of Illness: The appropriate patient status for  this patient is INPATIENT. Inpatient status is judged to be reasonable and necessary in order to provide the required intensity of service to ensure the patient's safety. The patient's presenting symptoms, physical exam findings, and initial radiographic and laboratory data in the context of their chronic comorbidities is felt to place them at high risk for further clinical deterioration. Furthermore, it is not anticipated that the patient will be medically stable for discharge from the hospital within 2 midnights of admission.   * I certify that at the point of admission it is my clinical judgment that the patient will require inpatient hospital care spanning beyond 2 midnights from the point of admission due to high intensity of service, high risk for further deterioration and high frequency of surveillance required.*   For questions or updates, please contact [place] Please consult [email or web address] for contact info under     Signed, Laurice Record, MD  11/28/2021 12:52 AM    Personally seen and examined. Agree with APP above with the following comments:  Briefly 89 ho M with prior PCI to LAD and RCA System (2004, 2009)   HTN, HLD complicated by statin myalgia, OSA non on CPAP who presents with angina; there is concern for a Type I MI.  Patient notes that for the past 5 months (since after seeing Dr. Rockey Situ), he has noted stuttering angina.  Fatigue and angina with exertion.  Improved with rest.  He has ACEi allergy (cough) and BB SE (fatigue), and statin allergy (myalgia- multiple attempted), and has not tolerated medical mangement.  11/21 PM had resting angina.  Improved with aspirin and rest.  He mad an appointment to see Dr. Rockey Situ. 11/22 PM had CP while watching his granddaugther playing. No SOB, Palpitations, syncope. Unclear ambulatory BP.  Exam notable for +2 radial pulse.  RRR. No murmur.  Rest as above. There is no BP cuff in the room.  Vitals yet to be checked this  AM Regular rate.  Labs notable for troponin  EKG SR; no ST changes Tele: SR with rare PVC Personally reviewed relevant tests;  CT CAP from 2022:, Suspect PLAD PCI, mLAD PCI and pRCA PCI   Prior cath report from 2009: ANGIOGRAPHIC DATA:  1. Left main coronary artery is normal.  2. Left circumflex.  The left circumflex has a 30-40% segmental      narrowing proximally.  The majority of the left circumflex      continues as a bifurcating obtuse marginal vessel with diffuse      irregularities but no significant focal stenosis.  The continuation      of the left circumflex is a moderately small vessel.  It has      diffuse 50-60% narrowing distally and one small branch has a 90%      narrowing.  These vessels would be too small to be a candidate for      angioplasty or possible bypass surgery.  3. Left anterior descending.  The left anterior descending has a stent      in its proximal segment.  The stent is widely patent.  In the      midportion of the vessel, there is a discrete area of 90% stenosis      which is approximately 10 mm in length.  The distal portion of the      left anterior descending  is considerably smaller than the more      proximal portion at this location, so, it would be suitable for a      stent placement.  The left anterior descending does have diffuse      disease distally as it crosses the apex.  4. Right coronary artery.  The right coronary artery is a small      dominant vessel.  There was a patent stent proximally.  There are      diffuse irregularities in the right coronary artery up to the crux.      Distally, the vessels are quite small and are diffusely diseased.      There was severe stenosis and a small 1-mm acute marginal vessel.      This would be approximately 90% stenotic but would not be suitable      for angioplasty or consideration for bypass grafting.    For key conditions including  Angina and NSTEMI HTN Urgency OSA on CPAP HLD with  statin myalgia   Would recommend  - This could be HTN urgency with known small vessel disease (LCX and RCA) vs worsening CAD with issues taking preventive therapy - Will give cardiac Diet today, get echo  - need vital signs checks - will add losartan 25 mg today; a slight increase in creatinine tomorrow is expected; this should not preclude cath - will add hydralazine this PM - continue heparin drip; received AS this AM - have added zetia today; Vascepa as outpatient would be appropriate - planned for cath tomorrow - labs for tomorrow - potential DC tomorrow  - Full Code - NPO at midnight - CPAP offered at night  Risks and benefits of cardiac catheterization have been discussed with the patient.  These include bleeding, infection, kidney damage, stroke, heart attack, death.  The patient understands these risks and is willing to proceed.  Access recommendations: R radial Procedural considerations: increase in creatinine tomorrow likely reflect ARB start; has known small vessel disease  Rudean Haskell, MD [facility]  [address] [place], [place] [address] [phone]  8:26 AM

## 2021-11-28 NOTE — Progress Notes (Signed)
  Echocardiogram 2D Echocardiogram has been performed.  Keith Murillo 11/28/2021, 1:21 PM

## 2021-11-28 NOTE — Progress Notes (Signed)
ANTICOAGULATION CONSULT NOTE - Follow Up Consult  Pharmacy Consult for heparin Indication:  NSTEMI  Labs: Recent Labs    11/27/21 1700 11/27/21 1910 11/27/21 2359 11/28/21 0244 11/28/21 0943 11/28/21 1108 11/28/21 1552  HGB 18.1*  --   --  18.2*  --   --   --   HCT 52.0  --   --  51.5  --   --   --   PLT 320  --   --  315  --   --   --   HEPARINUNFRC  --   --   --  0.16* 0.39  --  0.30  CREATININE 0.85  --   --   --   --   --   --   TROPONINIHS 397*   < > 741*  --  3,797* 4,207*  --    < > = values in this interval not displayed.     Assessment: 71yo male therapeutic heparin level at 0.39 on heparin with initial dosing for NSTEMI. Hgb 18.2 and plts 315. Per nursing, no signs/symptoms of bleeding or issues running the infusion.  11/28/2021 PM update Heparin level 0.30 (down from 0.39) No signs of bleeding Spoke with the nurse who informed this Pryor Curia the patient has been bending arm interrupting the infusion which could account for the lower heparin level  Goal of Therapy:  Heparin level 0.3-0.7 units/ml   Plan:  Continue heparin infusion at 1400 units/hr  Monitor CBC and heparin level daily Monitor for signs/symptoms of bleeding  Vaughan Basta BS, PharmD, BCPS Clinical Pharmacist 11/28/2021 5:07 PM  Contact: [phone] after 3 PM  "Be curious, not judgmental..." -Jamal Maes

## 2021-11-28 NOTE — Progress Notes (Signed)
ANTICOAGULATION CONSULT NOTE - Follow Up Consult  Pharmacy Consult for heparin Indication:  NSTEMI  Labs: Recent Labs    11/27/21 1700 11/27/21 1910 11/27/21 2135 11/27/21 2359 11/28/21 0244  HGB 18.1*  --   --   --  18.2*  HCT 52.0  --   --   --  51.5  PLT 320  --   --   --  315  HEPARINUNFRC  --   --   --   --  0.16*  CREATININE 0.85  --   --   --   --   TROPONINIHS 397* 610* 696* 741*  --     Assessment: 71yo male subtherapeutic on heparin with initial dosing for NSTEMI; no infusion issues or signs of bleeding per RN.  Goal of Therapy:  Heparin level 0.3-0.7 units/ml   Plan:  Will rebolus with heparin 2000 units and increase heparin infusion by 3 units/kg/hr to 1400 units/hr and check level in 6 hours.    Wynona Neat, PharmD, BCPS  11/28/2021,3:57 AM

## 2021-11-28 NOTE — Progress Notes (Signed)
ANTICOAGULATION CONSULT NOTE - Follow Up Consult  Pharmacy Consult for heparin Indication:  NSTEMI  Labs: Recent Labs    11/27/21 1700 11/27/21 1910 11/27/21 2135 11/27/21 2359 11/28/21 0244 11/28/21 0943  HGB 18.1*  --   --   --  18.2*  --   HCT 52.0  --   --   --  51.5  --   PLT 320  --   --   --  315  --   HEPARINUNFRC  --   --   --   --  0.16* 0.39  CREATININE 0.85  --   --   --   --   --   TROPONINIHS 397*   < > 696* 741*  --  3,797*   < > = values in this interval not displayed.     Assessment: 71yo male therapeutic heparin level at 0.39 on heparin with initial dosing for NSTEMI. Hgb 18.2 and plts 315. Per nursing, no signs/symptoms of bleeding or issues running the infusion.  Goal of Therapy:  Heparin level 0.3-0.7 units/ml   Plan:  Continue heparin infusion at 1400 units/hr  Re-check heparin level in 6 hours ('@1600'$ ) Monitor CBC daily Monitor for signs/symptoms of bleeding  Sandford Craze, PharmD. [facility] Acute Care PGY-1 11/28/2021 11:51 AM

## 2021-11-29 ENCOUNTER — Other Ambulatory Visit (HOSPITAL_COMMUNITY): Payer: Self-pay

## 2021-11-29 ENCOUNTER — Encounter (HOSPITAL_COMMUNITY): Payer: Self-pay | Admitting: Cardiology

## 2021-11-29 ENCOUNTER — Inpatient Hospital Stay (HOSPITAL_COMMUNITY)
Admission: EM | Disposition: A | Discharge status: Home / Self Care | Payer: Self-pay | Source: Home / Self Care | Attending: Cardiology

## 2021-11-29 DIAGNOSIS — I2510 Atherosclerotic heart disease of native coronary artery without angina pectoris: Secondary | ICD-10-CM

## 2021-11-29 HISTORY — PX: LEFT HEART CATH AND CORONARY ANGIOGRAPHY: CATH118249

## 2021-11-29 LAB — BASIC METABOLIC PANEL
Anion gap: 13 (ref 5–15)
BUN: 14 mg/dL (ref 8–23)
CO2: 24 mmol/L (ref 22–32)
Calcium: 8.6 mg/dL — ABNORMAL LOW (ref 8.9–10.3)
Chloride: 102 mmol/L (ref 98–111)
Creatinine, Ser: 0.94 mg/dL (ref 0.61–1.24)
GFR, Estimated: >60 mL/min (ref >60)
Glucose, Bld: 130 mg/dL — ABNORMAL HIGH (ref 70–99)
Potassium: 3.6 mmol/L (ref 3.5–5.1)
Sodium: 139 mmol/L (ref 135–145)

## 2021-11-29 LAB — CBC
HCT: 47.2 % (ref 39.0–52.0)
Hemoglobin: 16.2 g/dL (ref 13.0–17.0)
MCH: 29.4 pg (ref 26.0–34.0)
MCHC: 34.3 g/dL (ref 30.0–36.0)
MCV: 85.7 fL (ref 80.0–100.0)
Platelets: 288 10*3/uL (ref 150–400)
RBC: 5.51 MIL/uL (ref 4.22–5.81)
RDW: 13.4 % (ref 11.5–15.5)
WBC: 10.7 10*3/uL — ABNORMAL HIGH (ref 4.0–10.5)
nRBC: 0 % (ref 0.0–0.2)

## 2021-11-29 LAB — POCT ACTIVATED CLOTTING TIME
Activated Clotting Time: 275 seconds
Activated Clotting Time: 317 seconds

## 2021-11-29 LAB — HEMOGLOBIN A1C
Hgb A1c MFr Bld: 5.7 % — ABNORMAL HIGH (ref 4.8–5.6)
Mean Plasma Glucose: 117 mg/dL

## 2021-11-29 LAB — HEPARIN LEVEL (UNFRACTIONATED): Heparin Unfractionated: 0.26 IU/mL — ABNORMAL LOW (ref 0.30–0.70)

## 2021-11-29 SURGERY — LEFT HEART CATH AND CORONARY ANGIOGRAPHY
Anesthesia: LOCAL

## 2021-11-29 MED ORDER — HEPARIN SODIUM (PORCINE) 1000 UNIT/ML IJ SOLN
INTRAMUSCULAR | Status: DC | PRN
  Administered 2021-11-29: 6000 [IU] via INTRAVENOUS
  Administered 2021-11-29: 5000 [IU] via INTRAVENOUS

## 2021-11-29 MED ORDER — MIDAZOLAM HCL 2 MG/2ML IJ SOLN
INTRAMUSCULAR | Status: AC
  Filled 2021-11-29: qty 2

## 2021-11-29 MED ORDER — HEPARIN (PORCINE) IN NACL 1000-0.9 UT/500ML-% IV SOLN
INTRAVENOUS | Status: AC
  Filled 2021-11-29: qty 1000

## 2021-11-29 MED ORDER — SODIUM CHLORIDE 0.9 % IV SOLN: 250.0000 mL | INTRAVENOUS | Status: DC | PRN

## 2021-11-29 MED ORDER — TICAGRELOR 90 MG PO TABS
ORAL_TABLET | ORAL | Status: DC | PRN
  Administered 2021-11-29: 180 mg via ORAL

## 2021-11-29 MED ORDER — SODIUM CHLORIDE 0.9% FLUSH
3.0000 mL | Freq: Two times a day (BID) | INTRAVENOUS | Status: DC
  Administered 2021-11-29 – 2021-12-05 (×5): 3 mL via INTRAVENOUS

## 2021-11-29 MED ORDER — HEPARIN SODIUM (PORCINE) 1000 UNIT/ML IJ SOLN
INTRAMUSCULAR | Status: AC
  Filled 2021-11-29: qty 10

## 2021-11-29 MED ORDER — SODIUM CHLORIDE 0.9 % WEIGHT BASED INFUSION
1.0000 mL/kg/h | INTRAVENOUS | Status: AC
  Administered 2021-11-29: 1 mL/kg/h via INTRAVENOUS

## 2021-11-29 MED ORDER — TICAGRELOR 90 MG PO TABS
ORAL_TABLET | ORAL | Status: AC
  Filled 2021-11-29: qty 2

## 2021-11-29 MED ORDER — HEPARIN (PORCINE) IN NACL 1000-0.9 UT/500ML-% IV SOLN
INTRAVENOUS | Status: DC | PRN
  Administered 2021-11-29 (×2): 500 mL

## 2021-11-29 MED ORDER — LIDOCAINE HCL (PF) 1 % IJ SOLN
INTRAMUSCULAR | Status: AC
  Filled 2021-11-29: qty 30

## 2021-11-29 MED ORDER — LABETALOL HCL 5 MG/ML IV SOLN
INTRAVENOUS | Status: DC | PRN
  Administered 2021-11-29: 10 mg via INTRAVENOUS

## 2021-11-29 MED ORDER — TICAGRELOR 90 MG PO TABS
90.0000 mg | ORAL_TABLET | Freq: Two times a day (BID) | ORAL | Status: DC
  Administered 2021-11-29 – 2021-12-01 (×5): 90 mg via ORAL
  Filled 2021-11-29 (×5): qty 1

## 2021-11-29 MED ORDER — LABETALOL HCL 5 MG/ML IV SOLN
INTRAVENOUS | Status: AC
  Filled 2021-11-29: qty 4

## 2021-11-29 MED ORDER — AMLODIPINE BESYLATE 5 MG PO TABS
5.0000 mg | ORAL_TABLET | Freq: Every day | ORAL | Status: DC
  Administered 2021-11-29 – 2021-11-30 (×2): 5 mg via ORAL
  Filled 2021-11-29 (×2): qty 1

## 2021-11-29 MED ORDER — VERAPAMIL HCL 2.5 MG/ML IV SOLN
INTRAVENOUS | Status: DC | PRN
  Administered 2021-11-29: 10 mL via INTRA_ARTERIAL

## 2021-11-29 MED ORDER — FENTANYL CITRATE (PF) 100 MCG/2ML IJ SOLN
INTRAMUSCULAR | Status: AC
  Filled 2021-11-29: qty 2

## 2021-11-29 MED ORDER — LIDOCAINE HCL (PF) 1 % IJ SOLN
INTRAMUSCULAR | Status: DC | PRN
  Administered 2021-11-29: 2 mL

## 2021-11-29 MED ORDER — FENTANYL CITRATE (PF) 100 MCG/2ML IJ SOLN
INTRAMUSCULAR | Status: DC | PRN
  Administered 2021-11-29 (×3): 25 ug via INTRAVENOUS

## 2021-11-29 MED ORDER — SODIUM CHLORIDE 0.9% FLUSH: 3.0000 mL | INTRAVENOUS | Status: DC | PRN

## 2021-11-29 MED ORDER — MIDAZOLAM HCL 2 MG/2ML IJ SOLN
INTRAMUSCULAR | Status: DC | PRN
  Administered 2021-11-29 (×3): 1 mg via INTRAVENOUS

## 2021-11-29 MED ORDER — IOHEXOL 350 MG/ML SOLN
INTRAVENOUS | Status: DC | PRN
  Administered 2021-11-29: 180 mL

## 2021-11-29 MED ORDER — HEPARIN (PORCINE) 25000 UT/250ML-% IV SOLN
1750.0000 [IU]/h | INTRAVENOUS | Status: DC
  Administered 2021-11-29: 1600 [IU]/h via INTRAVENOUS
  Administered 2021-11-30: 1700 [IU]/h via INTRAVENOUS
  Administered 2021-12-01 – 2021-12-03 (×5): 1850 [IU]/h via INTRAVENOUS
  Administered 2021-12-03 – 2021-12-05 (×4): 1750 [IU]/h via INTRAVENOUS
  Filled 2021-11-29 (×12): qty 250

## 2021-11-29 MED ORDER — VERAPAMIL HCL 2.5 MG/ML IV SOLN
INTRAVENOUS | Status: AC
  Filled 2021-11-29: qty 2

## 2021-11-29 MED ORDER — NITROGLYCERIN 1 MG/10 ML FOR IR/CATH LAB
INTRA_ARTERIAL | Status: AC
  Filled 2021-11-29: qty 10

## 2021-11-29 MED ORDER — ASPIRIN 81 MG PO CHEW
81.0000 mg | CHEWABLE_TABLET | ORAL | Status: AC
  Administered 2021-11-29: 81 mg via ORAL
  Filled 2021-11-29: qty 1

## 2021-11-29 MED ORDER — PNEUMOCOCCAL 20-VAL CONJ VACC 0.5 ML IM SUSY
0.5000 mL | PREFILLED_SYRINGE | INTRAMUSCULAR | Status: DC
  Filled 2021-11-29: qty 0.5

## 2021-11-29 MED ORDER — HYDRALAZINE HCL 20 MG/ML IJ SOLN: 10.0000 mg | INTRAMUSCULAR | Status: AC | PRN

## 2021-11-29 SURGICAL SUPPLY — 22 items
BALLN EMERGE MR 2.0X8 (BALLOONS) ×1
BALLOON EMERGE MR 2.0X8 (BALLOONS) IMPLANT
CATH OPTICROSS HD (CATHETERS) IMPLANT
CATH OPTITORQUE TIG 4.0 5F (CATHETERS) IMPLANT
CATH VISTA GUIDE 6FR XB3.5 (CATHETERS) IMPLANT
DEVICE RAD COMP TR BAND LRG (VASCULAR PRODUCTS) IMPLANT
GLIDESHEATH SLEND SS 6F .021 (SHEATH) IMPLANT
GUIDEWIRE INQWIRE 1.5J.035X260 (WIRE) IMPLANT
INQWIRE 1.5J .035X260CM (WIRE) ×1
KIT ENCORE 26 ADVANTAGE (KITS) IMPLANT
KIT HEART LEFT (KITS) ×2 IMPLANT
PACK CARDIAC CATHETERIZATION (CUSTOM PROCEDURE TRAY) ×2 IMPLANT
SHEATH PROBE COVER 6X72 (BAG) IMPLANT
SLED PULL BACK IVUS (MISCELLANEOUS) IMPLANT
TRANSDUCER W/STOPCOCK (MISCELLANEOUS) ×2 IMPLANT
TUBING CIL FLEX 10 FLL-RA (TUBING) ×2 IMPLANT
WIRE ASAHI FIELDER XT 190CM (WIRE) IMPLANT
WIRE ASAHI PROWATER 180CM (WIRE) IMPLANT
WIRE ASAHI SION 190X3X12 .014 (WIRE) IMPLANT
WIRE HI TORQ BMW 190CM (WIRE) IMPLANT
WIRE PT2 MS 185 (WIRE) IMPLANT
WIRE RUNTHROUGH IZANAI 014 180 (WIRE) IMPLANT

## 2021-11-29 NOTE — Progress Notes (Signed)
ANTICOAGULATION CONSULT NOTE   Pharmacy Consult for heparin Indication: chest pain/ACS  Allergies  Allergen Reactions   Ace Inhibitors     cough   Beta Adrenergic Blockers     Fatigue   Codeine Other (See Comments)    Other reaction(s): Unknown   Lisinopril Cough   Statins     myalgias    Patient Measurements: Height: '5\' 8"'$  (172.7 cm) Weight: 100.9 kg (222 lb 8 oz) IBW/kg (Calculated) : 68.4 Heparin Dosing Weight: 87.8 kg   Vital Signs: Temp: 98.3 F (36.8 C) (11/24 0939) Temp Source: Oral (11/24 0939) BP: 111/61 (11/24 1128) Pulse Rate: 77 (11/24 0939)  Labs: Recent Labs    11/27/21 1700 11/27/21 1700 11/27/21 1910 11/27/21 2359 11/28/21 0244 11/28/21 0943 11/28/21 1108 11/28/21 1552 11/29/21 0134  HGB 18.1*  --   --   --  18.2*  --   --   --  16.2  HCT 52.0  --   --   --  51.5  --   --   --  47.2  PLT 320  --   --   --  315  --   --   --  288  HEPARINUNFRC  --    < >  --   --  0.16* 0.39  --  0.30 0.26*  CREATININE 0.85  --   --   --   --   --   --   --  0.94  TROPONINIHS 397*  --    < > 741*  --  3,797* 4,207*  --   --    < > = values in this interval not displayed.     Estimated Creatinine Clearance: 84.2 mL/min (by C-G formula based on SCr of 0.94 mg/dL).   Medical History: Past Medical History:  Diagnosis Date   Cancer [facility])    prostate   Chest pain    Complication of anesthesia    fighting with waking up   Coronary artery disease    Heart attack [facility])    Hypercholesterolemia    Hyperlipidemia    Hypertension    Ischemic heart disease September 2004   Known--with non-Q-wave myocardial infarction    Obesity    Sleep apnea    Assessment: 71 year old male presenting with chest pain. Has history of NSTEMI (2004) s/p stents to RCA (2004) and LAD (2009). No history of anticoagulation PTA. Pharmacy has been consulted to dose heparin.  He is now s/p cath with failed attempt at PCI. Heparin to restart 2 hours after TR band removed  Goal of  Therapy:  Heparin level 0.3-0.7 units/ml Monitor platelets by anticoagulation protocol: Yes   Plan: -Restart heparin infusion 1600 units/hour 2 hours after TR band removed -Check 8-hour heparin level  Hildred Laser, PharmD Clinical Pharmacist **Pharmacist phone directory can now be found on [email or web address] ([email or web address]).  Listed under [place].

## 2021-11-29 NOTE — TOC Benefit Eligibility Note (Signed)
Patient Research scientist (life sciences) completed.     The patient is currently admitted and upon discharge could be taking BRILINTA.   The current 30 day co-pay is, $37.   The patient is insured through [employer].

## 2021-11-29 NOTE — Brief Op Note (Signed)
BRIEF CARDIAC CATH - ATTEMPTED PTCA-PCI NOTE   NAME: Keith Murillo   MRN: [id number] 11/29/2021      9:33 AM   PCP:  [name], [place] Cardiologist:  Ida Rogue, MD    Referring Cardiologist: Dr. Rudean Haskell   SURGEON:  Surgeon(s) and Role:    * Leonie Man, MD - Primary  PROCEDURE:  Procedure(s): LEFT HEART CATH AND CORONARY ANGIOGRAPHY (N/A) ATTEMPTED PERCUTANEOUS CORONARY INTERVENTION  PATIENT:  Keith Murillo  72 y.o. male with a history of CAD with PCI to the LAD and RCA in 2004, 2000 5009 most recently with a long stent in the distal LAD), along with current cardiac risk factors of HTN and HLD as well as OSA on CPAP who presented with non-STEMI on 11/28/2021.  He is referred for invasive evaluation with cardiac catheterization and possible PCI.  PRE-OPERATIVE DIAGNOSIS:  NSTEMI  POST-OPERATIVE DIAGNOSIS:   Severe Multivessel CAD: Proximal RCA stent patent until the distal proximal stent is 100% stenosed with bridging collaterals filling marginal branches and faint left-to-right collaterals.  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  The superior branch is smaller with an ostial 60 to 70% stenosis.  The inferior branch is larger with proximal 20% stenosis. Ostial LAD roughly 50% followed by an ectatic segment just prior to SP1 which has eccentric 90% stenosis.  Just after SP1 there is a patent stent with trivial ISR.  There is mild diffuse disease leading up to the more distal stent that is patent but with distal edge 80% lesion followed by diffuse  disease and a 95% focal lesion at the apex before the vessel wraps around the apex perfusing the distal third of the PDA territory. Poor imaging on LV gram, however there appears to be mid to apical anterior hypokinesis as well as inferior hypokinesis.  EF estimated 45%.   PROCEDURE PERFORMED: Time Out: Verified patient identification, verified procedure, site/side was marked, verified correct patient position, special equipment/implants available, medications/allergies/relevent history reviewed, required imaging and test results available. Performed.  Access:  RIGHT Radial Artery: 6 Fr sheath -- Seldinger technique using Micropuncture Kit -- Direct ultrasound guidance used.  Permanent image obtained and placed on chart. -- 10 mL radial cocktail IA; 5000 Units IV Heparin  Left Heart Catheterization: 5Fr Catheters advanced or exchanged over a J-wire under direct fluoroscopic guidance into the ascending aorta; TIG 4.0 catheter advanced first.  * LV Hemodynamics (LV Gram): TIG 4.0 catheter * Right & Left Coronary Artery Cineangiography: TIG 4.0 Catheter   Review of initial angiography revealed: Severe multivessel disease as noted above.  Images were reviewed with Dr. [name] and the initial plan was to attempt PCI of the ostial LCx across the Cx-OM branch into the AV groove with provisional treatment of the CX OM.  Preparations are made for bifurcation PCI => Additional bolus IV heparin was administered to achieve and maintain ACT greater than 250 sec, he was loaded with Brilinta 180 mg p.o.  ATTEMPTED PCI: 6 French XB 3.5 guide catheter.  A BMW wire was easily  advanced down the Cx-OM, and several wires beginning with a [place], [employer] Christen Butter), Ramond Marrow and Choice PT wires were all attempted.  It was very difficult to make the acute angle turn into the LCx and then he had again a very close acute pain into the ABG.  On several occasions wires were engaged the ostial lesion and glanced off  into the OM.  Likely heavily calcified lesion.  After several attempts were made I decided to abort the effort with plans to reassess and discuss with interventional colleagues as to what the next best option would be.  Upon completion of Angiogaphy, the catheter was removed completely out of the body over a wire, without complication.  Radial sheath removed in the Cardiac Catheterization lab with TR Band placed for hemostasis.  MEDICATIONS SQ Lidocaine 3 mL Radial Cocktail: 3 mg Verapmil in 10 mL NS Heparin: Total of 11,000 units  ANESTHESIA:   local and IV sedation -> 3 mg Versed, 25 mg fentanyl  EBL: Minimal  BLCOUNTS:  YES  DICTATION: .Note written in [place]:  Return to nursing for ongoing care.  Restart IV heparin after TR band removal.  Continue to titrate antianginal medications.  We will discuss with interventional colleagues to determine if other options that are available.  Would proceed with 2D echo.Marland Kitchen  PATIENT DISPOSITION:  PACU - hemodynamically stable.     Glenetta Hew, MD

## 2021-11-29 NOTE — Progress Notes (Signed)
ANTICOAGULATION CONSULT NOTE   Pharmacy Consult for heparin Indication: chest pain/ACS  Allergies  Allergen Reactions   Ace Inhibitors     cough   Beta Adrenergic Blockers     Fatigue   Codeine Other (See Comments)    Other reaction(s): Unknown   Lisinopril Cough   Statins     myalgias    Patient Measurements: Height: '5\' 8"'$  (172.7 cm) Weight: 100.9 kg (222 lb 8 oz) IBW/kg (Calculated) : 68.4 Heparin Dosing Weight: 87.8 kg   Vital Signs: Temp: 97.7 F (36.5 C) (11/23 2047) Temp Source: Oral (11/23 2047) BP: 114/73 (11/23 2047) Pulse Rate: 97 (11/23 2047)  Labs: Recent Labs    11/27/21 1700 11/27/21 1700 11/27/21 1910 11/27/21 2359 11/28/21 0244 11/28/21 0943 11/28/21 1108 11/28/21 1552 11/29/21 0134  HGB 18.1*  --   --   --  18.2*  --   --   --  16.2  HCT 52.0  --   --   --  51.5  --   --   --  47.2  PLT 320  --   --   --  315  --   --   --  288  HEPARINUNFRC  --    < >  --   --  0.16* 0.39  --  0.30 0.26*  CREATININE 0.85  --   --   --   --   --   --   --  0.94  TROPONINIHS 397*  --    < > 741*  --  3,797* 4,207*  --   --    < > = values in this interval not displayed.     Estimated Creatinine Clearance: 84.2 mL/min (by C-G formula based on SCr of 0.94 mg/dL).   Medical History: Past Medical History:  Diagnosis Date   Cancer [facility])    prostate   Chest pain    Complication of anesthesia    fighting with waking up   Coronary artery disease    Heart attack [facility])    Hypercholesterolemia    Hyperlipidemia    Hypertension    Ischemic heart disease September 2004   Known--with non-Q-wave myocardial infarction    Obesity    Sleep apnea    Assessment: 71 year old male presenting with chest pain. Has history of NSTEMI (2004) s/p stents to RCA (2004) and LAD (2009). No history of anticoagulation PTA. Pharmacy has been consulted to dose heparin.  Heparin level 0.26 on 1400 units/hr, no issues with infusion or overt s/sx of bleeding reported by RN. CBC  stable  Goal of Therapy:  Heparin level 0.3-0.7 units/ml Monitor platelets by anticoagulation protocol: Yes   Plan: Increase heparin infusion to 1600 units/hour Check 8-hour heparin level Monitor daily heparin levels, CBC, and signs/symptoms of bleeding  Georga Bora, PharmD Clinical Pharmacist 11/29/2021 3:12 AM Please check AMION for all [place] numbers

## 2021-11-29 NOTE — Interval H&P Note (Signed)
History and Physical Interval Note:  11/29/2021 7:31 AM  Brent General  has presented today for surgery, with the diagnosis of NSTEMI.  The various methods of treatment have been discussed with the patient and family. After consideration of risks, benefits and other options for treatment, the patient has consented to  Procedure(s): LEFT HEART CATH AND CORONARY ANGIOGRAPHY (N/A)  PERCUTANEOUS CORONARY INTERVENTION  as a surgical intervention.  The patient's history has been reviewed, patient examined, no change in status, stable for surgery.  I have reviewed the patient's chart and labs.  Questions were answered to the patient's satisfaction.    Cath Lab Visit (complete for each Cath Lab visit)  Clinical Evaluation Leading to the Procedure:   ACS: Yes.    Non-ACS:    Anginal Classification: CCS IV  Anti-ischemic medical therapy: Minimal Therapy (1 class of medications)  Non-Invasive Test Results: No non-invasive testing performed  Prior CABG: No previous CABG    Glenetta Hew

## 2021-11-29 NOTE — Progress Notes (Signed)
Refused cpap.

## 2021-11-29 NOTE — TOC Progression Note (Signed)
Transition of Care [facility]) - Progression Note    Patient Details  Name: Keith Murillo MRN: [id number] Date of Birth: 1950/09/21  Transition of Care [facility]) CM/SW Contact  Zenon Mayo, RN Phone Number: 11/29/2021, 4:09 PM  Clinical Narrative:    From home, chest pain, He is now s/p cath with failed attempt at PCI. TOC following.         Expected Discharge Plan and Services                                                 Social Determinants of Health (SDOH) Interventions    Readmission Risk Interventions     No data to display

## 2021-11-30 DIAGNOSIS — I214 Non-ST elevation (NSTEMI) myocardial infarction: Secondary | ICD-10-CM | POA: Diagnosis not present

## 2021-11-30 LAB — BASIC METABOLIC PANEL
Anion gap: 10 (ref 5–15)
BUN: 9 mg/dL (ref 8–23)
CO2: 24 mmol/L (ref 22–32)
Calcium: 8.7 mg/dL — ABNORMAL LOW (ref 8.9–10.3)
Chloride: 102 mmol/L (ref 98–111)
Creatinine, Ser: 0.94 mg/dL (ref 0.61–1.24)
GFR, Estimated: >60 mL/min (ref >60)
Glucose, Bld: 143 mg/dL — ABNORMAL HIGH (ref 70–99)
Potassium: 3.8 mmol/L (ref 3.5–5.1)
Sodium: 136 mmol/L (ref 135–145)

## 2021-11-30 LAB — HEPARIN LEVEL (UNFRACTIONATED)
Heparin Unfractionated: 0.24 IU/mL — ABNORMAL LOW (ref 0.30–0.70)
Heparin Unfractionated: 0.3 IU/mL (ref 0.30–0.70)
Heparin Unfractionated: 0.42 IU/mL (ref 0.30–0.70)

## 2021-11-30 LAB — CBC
HCT: 46.5 % (ref 39.0–52.0)
Hemoglobin: 16 g/dL (ref 13.0–17.0)
MCH: 29.5 pg (ref 26.0–34.0)
MCHC: 34.4 g/dL (ref 30.0–36.0)
MCV: 85.6 fL (ref 80.0–100.0)
Platelets: 256 10*3/uL (ref 150–400)
RBC: 5.43 MIL/uL (ref 4.22–5.81)
RDW: 13.5 % (ref 11.5–15.5)
WBC: 10.6 10*3/uL — ABNORMAL HIGH (ref 4.0–10.5)
nRBC: 0 % (ref 0.0–0.2)

## 2021-11-30 LAB — LIPOPROTEIN A (LPA): Lipoprotein (a): 58.2 nmol/L — ABNORMAL HIGH (ref <75.0)

## 2021-11-30 MED ORDER — AMLODIPINE BESYLATE 10 MG PO TABS
10.0000 mg | ORAL_TABLET | Freq: Every day | ORAL | Status: DC
  Administered 2021-12-01 – 2021-12-05 (×5): 10 mg via ORAL
  Filled 2021-11-30 (×5): qty 1

## 2021-11-30 MED ORDER — IRBESARTAN 150 MG PO TABS
150.0000 mg | ORAL_TABLET | Freq: Every day | ORAL | Status: DC
  Administered 2021-11-30: 150 mg via ORAL
  Filled 2021-11-30: qty 1

## 2021-11-30 MED ORDER — FLUTICASONE PROPIONATE 50 MCG/ACT NA SUSP
1.0000 | Freq: Every day | NASAL | Status: DC
  Filled 2021-11-30: qty 16

## 2021-11-30 NOTE — Progress Notes (Signed)
ANTICOAGULATION CONSULT NOTE   Pharmacy Consult for heparin Indication: chest pain/ACS  Allergies  Allergen Reactions   Ace Inhibitors     cough   Beta Adrenergic Blockers     Fatigue   Codeine Other (See Comments)    Other reaction(s): Unknown   Lisinopril Cough   Statins     myalgias    Patient Measurements: Height: '5\' 8"'$  (172.7 cm) Weight: 100.9 kg (222 lb 8 oz) IBW/kg (Calculated) : 68.4 Heparin Dosing Weight: 87.8 kg   Vital Signs: Temp: 98.6 F (37 C) (11/24 2355) Temp Source: Oral (11/24 2355) BP: 127/65 (11/24 2355) Pulse Rate: 75 (11/24 2355)  Labs: Recent Labs    11/27/21 1700 11/27/21 1700 11/27/21 1910 11/27/21 2359 11/28/21 0244 11/28/21 0943 11/28/21 1108 11/28/21 1552 11/29/21 0134 11/30/21 0202  HGB 18.1*  --   --   --  18.2*  --   --   --  16.2 16.0  HCT 52.0  --   --   --  51.5  --   --   --  47.2 46.5  PLT 320  --   --   --  315  --   --   --  288 256  HEPARINUNFRC  --    < >  --   --  0.16* 0.39  --  0.30 0.26* 0.24*  CREATININE 0.85  --   --   --   --   --   --   --  0.94 0.94  TROPONINIHS 397*  --    < > 741*  --  3,797* 4,207*  --   --   --    < > = values in this interval not displayed.     Estimated Creatinine Clearance: 84.2 mL/min (by C-G formula based on SCr of 0.94 mg/dL).   Medical History: Past Medical History:  Diagnosis Date   Cancer [facility])    prostate   Chest pain    Complication of anesthesia    fighting with waking up   Coronary artery disease    Heart attack [facility])    Hypercholesterolemia    Hyperlipidemia    Hypertension    Ischemic heart disease September 2004   Known--with non-Q-wave myocardial infarction    Obesity    Sleep apnea    Assessment: 71 year old male presenting with chest pain. Has history of NSTEMI (2004) s/p stents to RCA (2004) and LAD (2009). No history of anticoagulation PTA. Pharmacy has been consulted to dose heparin.  He is now s/p cath with failed attempt at PCI. Heparin to restart  2 hours after TR band removed  11/25 AM update:  Heparin level low after re-start s/p cath Hgb stable  Goal of Therapy:  Heparin level 0.3-0.7 units/ml Monitor platelets by anticoagulation protocol: Yes   Plan: -Inc heparin to 1700 units/hr -Re-check heparin level in 8 hours  Narda Bonds, PharmD, [place] Pharmacist Phone: [phone]

## 2021-11-30 NOTE — Progress Notes (Addendum)
ANTICOAGULATION CONSULT NOTE- Follow Up  Pharmacy Consult for heparin Indication: chest pain/ACS  Allergies  Allergen Reactions   Ace Inhibitors     cough   Beta Adrenergic Blockers     Fatigue   Codeine Other (See Comments)    Other reaction(s): Unknown   Lisinopril Cough   Statins     myalgias    Patient Measurements: Height: '5\' 8"'$  (172.7 cm) Weight: 100.9 kg (222 lb 8 oz) IBW/kg (Calculated) : 68.4 Heparin Dosing Weight: 87.8 kg   Vital Signs: Temp: 98 F (36.7 C) (11/25 1234) Temp Source: Oral (11/25 1234) BP: 115/74 (11/25 1234) Pulse Rate: 66 (11/25 1234)  Labs: Recent Labs    11/27/21 1700 11/27/21 1700 11/27/21 1910 11/27/21 2359 11/28/21 0244 11/28/21 0943 11/28/21 1108 11/28/21 1552 11/29/21 0134 11/30/21 0202  HGB 18.1*  --   --   --  18.2*  --   --   --  16.2 16.0  HCT 52.0  --   --   --  51.5  --   --   --  47.2 46.5  PLT 320  --   --   --  315  --   --   --  288 256  HEPARINUNFRC  --    < >  --   --  0.16* 0.39  --  0.30 0.26* 0.24*  CREATININE 0.85  --   --   --   --   --   --   --  0.94 0.94  TROPONINIHS 397*  --    < > 741*  --  3,797* 4,207*  --   --   --    < > = values in this interval not displayed.     Estimated Creatinine Clearance: 84.2 mL/min (by C-G formula based on SCr of 0.94 mg/dL).   Medical History: Past Medical History:  Diagnosis Date   Cancer [facility])    prostate   Chest pain    Complication of anesthesia    fighting with waking up   Coronary artery disease    Heart attack [facility])    Hypercholesterolemia    Hyperlipidemia    Hypertension    Ischemic heart disease September 2004   Known--with non-Q-wave myocardial infarction    Obesity    Sleep apnea    Assessment: 71 year old male presenting with chest pain. Has history of NSTEMI (2004) s/p stents to RCA (2004) and LAD (2009). No history of anticoagulation PTA. Pharmacy has been consulted to dose heparin.  He is now s/p cath with failed attempt at PCI. Heparin  to restart 2 hours after TR band removed  11/25: Heparin level subtherapeutic at 0.24. Hgb 16 and plts 256. Per RN no issues with infusion or signs/symptoms of bleeding. Plan to continue heparin throughout the weekend  Goal of Therapy:  Heparin level 0.3-0.7 units/ml Monitor platelets by anticoagulation protocol: Yes   Plan: -Inc heparin to 1850 units/hr (inc by 2 units/kg/hr) -Re-check heparin level in 8 hours ('@2100'$ ) -Monitor CBC and heparin level daily -Monitor for signs/symptoms of bleeding  Sandford Craze, PharmD. [facility] Acute Care PGY-1 11/30/2021 12:42 PM

## 2021-11-30 NOTE — Progress Notes (Signed)
Cpapt at bedside. Pt said will call when ready

## 2021-11-30 NOTE — Progress Notes (Signed)
Rounding Note    Patient Name: Keith Murillo Date of Encounter: 11/30/2021  [place] Cardiologist: Ida Rogue, MD   Subjective   Currently feeling well without chest pain or shortness of breath  Inpatient Medications    Scheduled Meds:  amLODipine  5 mg Oral Daily   aspirin EC  81 mg Oral Daily   cyanocobalamin  5,000 mcg Oral Daily   ezetimibe  10 mg Oral Daily   isosorbide mononitrate  60 mg Oral Daily   losartan  25 mg Oral Daily   melatonin  5 mg Oral QHS   pneumococcal 20-valent conjugate vaccine  0.5 mL Intramuscular Tomorrow-1000   sodium chloride flush  3 mL Intravenous Q12H   thyroid  60 mg Oral q morning   ticagrelor  90 mg Oral BID   Continuous Infusions:  sodium chloride     heparin 1,700 Units/hr (11/30/21 1028)   PRN Meds: sodium chloride, acetaminophen, nitroGLYCERIN, ondansetron (ZOFRAN) IV, sodium chloride flush   Vital Signs    Vitals:   11/29/21 2355 11/30/21 0426 11/30/21 0823 11/30/21 0921  BP: 127/65 (!) 150/87 (!) 167/94 (!) 155/95  Pulse: 75 80 76   Resp: _0 Temp: 98.6 F (37 C) 98.4 F (36.9 C) 98 F (36.7 C)   TempSrc: Oral Oral Oral   SpO2: 99% 97% 96%   Weight:      Height:        Intake/Output Summary (Last 24 hours) at 11/30/2021 1056 Last data filed at 11/30/2021 0920 Gross per 24 hour  Intake 1721.06 ml  Output 1450 ml  Net 271.06 ml      11/27/2021   11:38 PM 11/27/2021    6:05 PM 08/03/2021    2:03 PM  Last 3 Weights  Weight (lbs) 222 lb 8 oz 220 lb 219 lb 9.3 oz  Weight (kg) 100.925 kg 99.791 kg 99.6 kg      Telemetry    Sinus rhythm- Personally Reviewed  ECG    Sinus rhythm- Personally Reviewed  Physical Exam   GEN: No acute distress.   Neck: No JVD Cardiac: RRR, no murmurs, rubs, or gallops.  Respiratory: Clear to auscultation bilaterally. GI: Soft, nontender, non-distended  MS: No edema; No deformity. Neuro:  Nonfocal  Psych: Normal affect   Labs    High  Sensitivity Troponin:   Recent Labs  Lab 11/27/21 1910 11/27/21 2135 11/27/21 2359 11/28/21 0943 11/28/21 1108  TROPONINIHS 610* 696* 741* 3,797* 4,207*     Chemistry Recent Labs  Lab 11/27/21 1700 11/29/21 0134 11/30/21 0202  NA 134* 139 136  K 3.9 3.6 3.8  CL 101 102 102  CO2 _1 GLUCOSE 95 130* 143*  BUN _2 CREATININE 0.85 0.94 0.94  CALCIUM 9.4 8.6* 8.7*  GFRNONAA >60 >60 >60  ANIONGAP _3 Lipids  Recent Labs  Lab 11/28/21 0244  CHOL 179  TRIG 322*  HDL 33*  LDLCALC 82  CHOLHDL 5.4    Hematology Recent Labs  Lab 11/28/21 0244 11/29/21 0134 11/30/21 0202  WBC 7.8 10.7* 10.6*  RBC 6.15* 5.51 5.43  HGB 18.2* 16.2 16.0  HCT 51.5 47.2 46.5  MCV 83.7 85.7 85.6  MCH 29.6 29.4 29.5  MCHC 35.3 34.3 34.4  RDW 13.2 13.4 13.5  PLT 315 288 256   Thyroid  Recent Labs  Lab 11/28/21 0244  TSH 3.416    BNPNo results for input(s): "BNP", "  PROBNP" in the last 168 hours.  DDimer No results for input(s): "DDIMER" in the last 168 hours.   Radiology    CARDIAC CATHETERIZATION  Result Date: 11/29/2021   Prox RCA (distal edge of stent) is 90% stenosed. Prox RCA to Dist RCA lesion is 100% stenosed.   Dist LM to Ost LAD lesion is 55% stenosed.   Prox LAD to Mid LAD stent is 5% stenosed. 1st Sept lesion is 95% stenosed.   Mid LAD to Dist LAD stent is 5% stenosed.   Dist LAD-1 lesion is 80% stenosed. Dist LAD-2 lesion is 95% stenosed.   Ost Cx to Prox Cx lesion is 55% stenosed. Prox Cx lesion is 70% stenosed. 1st Mrg lesion is 70% stenosed.   Ostial LPAV lesion is 99% stenosed.  Heavily calcified focal lesion.   Balloon angioplasty was attempted using a BALLN EMERGE MR 2.0X8.  Unable to cross with wire.  Therefore PTC aborted.  Post intervention, there is a 99% residual stenosis.   There is mild to moderate left ventricular systolic dysfunction. The left ventricular ejection fraction is 35-45% by visual estimate.   LV end diastolic pressure is mildly  elevated. - 15 mmHg   There is no aortic valve stenosis. POST-OPERATIVE DIAGNOSIS:  Severe Multivessel CAD: Proximal RCA stent patent until the distal proximal stent is 100% stenosed with bridging collaterals filling marginal branches and faint left-to-right collaterals.  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  The superior OM1 branch is smaller with an ostial 60 to 70% stenosis.  The inferior branch is larger with proximal 20% stenosis. Ostial LAD roughly 50% followed by an ectatic segment just prior to SP1 which has eccentric 90% stenosis.  Just after SP1 there is a patent stent with trivial ISR.  There is mild diffuse disease leading up to the more distal stent that is patent but with distal edge 80% lesion followed by diffuse disease and a 95% focal lesion at the apex before the vessel wraps around the apex perfusing the distal third of the PDA territory. Poor imaging on LV gram, however there appears to be mid to apical anterior hypokinesis as well as inferior hypokinesis.  EF estimated 45%. PLAN OF CARE:  Return to nursing for ongoing care.  Restart IV heparin after TR band removal.  Continue to titrate antianginal medications.  We Elieser Tetrick discuss with interventional colleagues to determine if other options that are available patient has been transferred to Team C.  Would proceed with 2D echo.. I have added low-dose amlodipine given his hypertension and need for antianginal therapy.  Apparently there was an issue with beta-blockers. Glenetta Hew, MD  ECHOCARDIOGRAM COMPLETE  Result Date: 11/28/2021    ECHOCARDIOGRAM REPORT   Patient Name:    Keith Murillo Date of Exam: 11/28/2021 Medical Rec #:  [id number]          Height:       68.0 in Accession #:    [id number]         Weight:       222.5 lb Date of Birth:  Oct 01, 1950          BSA:  2.138 m Patient Age:    65 years           BP:           116/68 mmHg Patient Gender: M                  HR:           72 bpm. Exam Location:  Inpatient Procedure: 2D Echo, Color Doppler, Cardiac Doppler and Intracardiac            Opacification Agent Indications:    NSTEMI I21.4  History:        Patient has prior history of Echocardiogram examinations, most                 recent 03/16/2018. NSTEMI and CAD; Risk Factors:Dyslipidemia,                 Non-Smoker and Sleep Apnea.  Sonographer:    Greer Pickerel Referring Phys: [id number] FAN YE  Sonographer Comments: No subcostal window and suboptimal parasternal window. Image acquisition challenging due to respiratory motion and Image acquisition challenging due to patient body habitus. IMPRESSIONS  1. Left ventricular ejection fraction, by estimation, is 60 to 65%. The left ventricle has normal function. The left ventricle has no regional wall motion abnormalities. There is severe concentric left ventricular hypertrophy. Left ventricular diastolic  parameters are consistent with Grade I diastolic dysfunction (impaired relaxation).  2. Right ventricular systolic function is normal. The right ventricular size is normal. Tricuspid regurgitation signal is inadequate for assessing PA pressure.  3. Left atrial size was mildly dilated.  4. The mitral valve is normal in structure. Trivial mitral valve regurgitation. No evidence of mitral stenosis.  5. The aortic valve is tricuspid. Aortic valve regurgitation is not visualized. No aortic stenosis is present.  6. Aortic dilatation noted. There is mild dilatation of the aortic root, measuring 40 mm. Comparison(s): No significant change from prior study. FINDINGS  Left Ventricle: Left ventricular ejection fraction, by estimation,  is 60 to 65%. The left ventricle has normal function. The left ventricle has no regional wall motion abnormalities. Definity contrast agent was given IV to delineate the left ventricular  endocardial borders. The left ventricular internal cavity size was normal in size. There is severe concentric left ventricular hypertrophy. Left ventricular diastolic parameters are consistent with Grade I diastolic dysfunction (impaired relaxation). Right Ventricle: The right ventricular size is normal. No increase in right ventricular wall thickness. Right ventricular systolic function is normal. Tricuspid regurgitation signal is inadequate for assessing PA pressure. Left Atrium: Left atrial size was mildly dilated. Right Atrium: Right atrial size was normal in size. Pericardium: Trivial pericardial effusion is present. Presence of epicardial fat layer. Mitral Valve: The mitral valve is normal in structure. Trivial mitral valve regurgitation. No evidence of mitral valve stenosis. Tricuspid Valve: The tricuspid valve is normal in structure. Tricuspid valve regurgitation is not demonstrated. No evidence of tricuspid stenosis. Aortic Valve: The aortic valve is tricuspid. Aortic valve regurgitation is not visualized. No aortic stenosis is present. Pulmonic Valve: The pulmonic valve was not well visualized. Pulmonic valve regurgitation is trivial. No evidence of pulmonic stenosis. Aorta: Aortic dilatation noted. There is mild dilatation of the aortic root, measuring 40 mm. IAS/Shunts: No atrial level shunt detected by color flow Doppler.  LEFT VENTRICLE PLAX 2D LVIDd:         4.10 cm   Diastology LVIDs:         2.80 cm   LV  e' medial:    3.15 cm/s LV PW:         1.50 cm   LV E/e' medial:  15.9 LV IVS:        1.40 cm   LV e' lateral:   6.09 cm/s LVOT diam:     2.10 cm   LV E/e' lateral: 8.2 LV SV:         74 LV SV Index:   35 LVOT Area:     3.46 cm  RIGHT VENTRICLE RV S prime:     12.40 cm/s TAPSE (M-mode): 1.6 cm LEFT ATRIUM              Index        RIGHT ATRIUM           Index LA diam:        3.30 cm 1.54 cm/m   RA Area:     13.00 cm LA Vol (A2C):   54.1 ml 25.30 ml/m  RA Volume:   24.80 ml  11.60 ml/m LA Vol (A4C):   88.7 ml 41.48 ml/m LA Biplane Vol: 69.7 ml 32.59 ml/m  AORTIC VALVE             PULMONIC VALVE LVOT Vmax:   110.50 cm/s PR End Diast Vel: 3.83 msec LVOT Vmean:  68.150 cm/s LVOT VTI:    0.214 m  AORTA Ao Root diam: 3.90 cm MITRAL VALVE               TRICUSPID VALVE MV Area (PHT): 2.69 cm    TR Peak grad:   3.5 mmHg MV Decel Time: 282 msec    TR Vmax:        93.10 cm/s MV E velocity: 50.10 cm/s MV A velocity: 75.40 cm/s  SHUNTS MV E/A ratio:  0.66        Systemic VTI:  0.21 m                            Systemic Diam: 2.10 cm Rudean Haskell MD Electronically signed by Rudean Haskell MD Signature Date/Time: 11/28/2021/2:25:54 PM    Final     Cardiac Studies   Left heart cath   Prox RCA (distal edge of stent) is 90% stenosed. Prox RCA to Dist RCA lesion is 100% stenosed.   Dist LM to Ost LAD lesion is 55% stenosed.   Prox LAD to Mid LAD stent is 5% stenosed. 1st Sept lesion is 95% stenosed.   Mid LAD to Dist LAD stent is 5% stenosed.   Dist LAD-1 lesion is 80% stenosed. Dist LAD-2 lesion is 95% stenosed.   Ost Cx to Prox Cx lesion is 55% stenosed. Prox Cx lesion is 70% stenosed. 1st Mrg lesion is 70% stenosed.   Ostial LPAV lesion is 99% stenosed.  Heavily calcified focal lesion.   Balloon angioplasty was attempted using a BALLN EMERGE MR 2.0X8.  Unable to cross with wire.  Therefore PTC aborted.  Post intervention, there is a 99% residual stenosis.   There is mild to moderate left ventricular systolic dysfunction. The left ventricular ejection fraction is 35-45% by visual estimate.   LV end diastolic pressure is mildly elevated. - 15 mmHg   There is no aortic valve stenosis.  TTE  1. Left ventricular ejection fraction, by estimation, is 60 to 65%. The  left ventricle has normal function. The left  ventricle has no regional  wall motion abnormalities. There is severe concentric  left ventricular  hypertrophy. Left ventricular diastolic   parameters are consistent with Grade I diastolic dysfunction (impaired  relaxation).   2. Right ventricular systolic function is normal. The right ventricular  size is normal. Tricuspid regurgitation signal is inadequate for assessing  PA pressure.   3. Left atrial size was mildly dilated.   4. The mitral valve is normal in structure. Trivial mitral valve  regurgitation. No evidence of mitral stenosis.   5. The aortic valve is tricuspid. Aortic valve regurgitation is not  visualized. No aortic stenosis is present.   6. Aortic dilatation noted. There is mild dilatation of the aortic root,  measuring 40 mm.   Patient Profile     71 y.o. male history of coronary artery disease status post LAD and RCA PCI, hyperlipidemia, OSA not on CPAP presented to the hospital with chest pain.  Assessment & Plan    1.  Non-STEMI: Significant troponin elevation to 4207.  Currently on IV heparin without pain.  Left heart catheterization yesterday with significant disease, unable to cross a circumflex lesion.  Plan is for discussion among the interventionalists about plan of care.  We Daren Yeagle continue IV heparin throughout the weekend.  2.  Hypertension: Stop losartan today.  Start Avapro 150 mg daily, increase Norvasc to 10 mg daily  3.  Obstructive sleep apnea: CPAP compliance encouraged  4.  Hyperlipidemia: Continue zetia.  Goal LDL less than 70.   For questions or updates, please contact [place] Please consult [email or web address] for contact info under        Signed, Kaimen Peine Meredith Leeds, MD  11/30/2021, 10:56 AM

## 2021-11-30 NOTE — Progress Notes (Deleted)
Cardiology Office Note  Date:  11/30/2021   ID:  Keith Murillo, DOB 10-10-1950, MRN [id number]  PCP:  [name], FNP   No chief complaint on file.   HPI:  Keith Murillo is a 71 yo male with history of  CAD s/p remote PCI to LAD and RCA (2004, 2009), (4 stents total)  HLD,  OSA not on CPAP prostate cancer, CAD, 2004 with NSTEMI, RCA stenting  cath in 2009 with PCI to the LAD Off statins "since 2010" Who presents for f/u of his CAD.  Last seen by myself in clinic July 2023 Into the hospital November 29, 2021 with non-STEMI Left heart catheterization yesterday with significant disease, unable to cross a circumflex lesion.  Severe Multivessel CAD: Proximal RCA stent patent until the distal proximal stent is 100% stenosed with bridging collaterals filling marginal branches and faint left-to-right collaterals.  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  The superior OM1 branch is smaller with an ostial 60 to 70% stenosis.  The inferior branch is larger with proximal 20% stenosis. Ostial LAD roughly 50% followed by an ectatic segment just prior to SP1 which has eccentric 90% stenosis.  Just after SP1 there is a patent stent with trivial ISR.  There is mild diffuse disease leading up to the more distal stent that is patent but with distal edge 80% lesion followed by diffuse disease and a 95% focal lesion at the apex before the vessel wraps around the apex perfusing the distal third of the PDA territory. Poor imaging on LV gram, however there appears to be mid to apical anterior hypokinesis as well as inferior  hypokinesis.  EF estimated 45%.    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  EF by echo 60-65%. Crestor '10mg'$  daily (now discontinued 2/2 myalgias).    cough on ACEi   stress test in 07/2018.EF 56% and NRWMA.  small defect of mild severity noted in the mid inferolateral and apical inferior location and findings found consistent with a small area of apical inferolateral ischemia.   Lab Results  Component Value Date   CHOL 179 11/28/2021   HDL 33 (L) 11/28/2021   LDLCALC 82 11/28/2021   TRIG 322 (H) 11/28/2021      PMH:   has a past medical history of Cancer [facility]), Chest pain, Complication of anesthesia, Coronary artery disease, Heart attack ([place]), Hypercholesterolemia, Hyperlipidemia, Hypertension, Ischemic heart disease (September 2004), Obesity, and Sleep apnea.  PSH:    Past Surgical History:  Procedure Laterality Date   CARDIAC CATHETERIZATION  11/2007   INGUINAL HERNIA REPAIR Right 09/22/2019   Procedure: RIGHT INGUINAL HERNIA WITH MESH;  Surgeon: Erroll Luna, MD;  Location: [place];  Service: General;  Laterality: Right;  TAP BLOCK   LEFT HEART CATH AND CORONARY ANGIOGRAPHY N/A 11/29/2021   Procedure: LEFT HEART CATH AND CORONARY ANGIOGRAPHY;  Surgeon: Leonie Man, MD;  Location: [facility];   Service: Cardiovascular;  Laterality: N/A;   TEE WITHOUT CARDIOVERSION N/A 03/16/2018   Procedure: TRANSESOPHAGEAL ECHOCARDIOGRAM (TEE);  Surgeon: Acie Fredrickson Wonda Cheng, MD;  Location: [facility] ENDOSCOPY;  Service: Cardiovascular;  Laterality: N/A;    No current facility-administered medications for this visit.   No current outpatient medications on file.   Facility-Administered Medications Ordered in Other Visits  Medication Dose Route Frequency Provider Last Rate Last Admin   0.9 %  sodium chloride infusion  250 mL Intravenous PRN Leonie Man, MD       acetaminophen (TYLENOL) tablet 650 mg  650 mg Oral Q4H PRN Leonie Man, MD   650 mg at 11/29/21 1612   [START ON 12/01/2021] amLODipine (NORVASC) tablet 10 mg  10 mg Oral Daily Camnitz, Will Hassell Done, MD       aspirin EC tablet 81 mg  81 mg Oral Daily Leonie Man, MD   81 mg at 11/30/21 2423   cyanocobalamin (VITAMIN B12) tablet 5,000 mcg  5,000 mcg Oral Daily Leonie Man, MD   5,000 mcg at 11/30/21 5361   ezetimibe (ZETIA) tablet 10 mg  10 mg Oral Daily Leonie Man, MD   10 mg at 11/30/21 4431   heparin ADULT infusion 100 units/mL (25000 units/282m)  1,850 Units/hr Intravenous Continuous WSandford Craze RPH 18.5 mL/hr at 11/30/21 1301 1,850 Units/hr at 11/30/21 1301   irbesartan (AVAPRO) tablet 150 mg  150 mg Oral Daily CConstance Haw MD   150 mg at 11/30/21 1306   isosorbide mononitrate (IMDUR) 24 hr tablet 60 mg  60 mg Oral Daily HLeonie Man MD   60 mg at 11/30/21 05400  melatonin tablet 5 mg  5 mg Oral QHS HLeonie Man MD   5 mg at 11/29/21 2210   nitroGLYCERIN (NITROSTAT) SL tablet 0.4 mg  0.4 mg Sublingual Q5 Min x 3 PRN HLeonie Man MD       ondansetron ([facility] injection 4 mg  4 mg Intravenous Q6H PRN HLeonie Man MD       pneumococcal 20-valent conjugate vaccine (PREVNAR 20) injection 0.5 mL  0.5 mL Intramuscular Tomorrow-1000 HLeonie Man MD       sodium chloride flush (NS) 0.9 % injection  3 mL  3 mL Intravenous Q12H HLeonie Man MD   3 mL at 11/30/21 1028   sodium chloride flush (NS) 0.9 % injection 3 mL  3 mL Intravenous PRN HLeonie Man MD       thyroid (ARMOUR) tablet 60 mg  60 mg Oral q morning HLeonie Man MD   60 mg at 11/30/21 08676  ticagrelor (BRILINTA) tablet 90 mg  90 mg Oral BID HLeonie Man MD   90 mg at 11/30/21 01950    Allergies:   Ace inhibitors, Beta adrenergic blockers, Codeine, Lisinopril, and Statins   Social History:  The patient  reports that he has never smoked. He has never used smokeless tobacco. He reports current alcohol use. He reports that he does not use drugs.   Family History:   family history includes  Cancer in his maternal grandmother and mother; Hearing loss in his father; Heart attack in his paternal grandfather; Parkinson's disease in his maternal grandfather; Stroke in his maternal grandfather and paternal grandmother.    Review of Systems: Review of Systems  Constitutional: Negative.   HENT: Negative.    Respiratory: Negative.    Cardiovascular: Negative.   Gastrointestinal: Negative.   Musculoskeletal: Negative.   Neurological: Negative.   Psychiatric/Behavioral: Negative.    All other systems reviewed and are negative.   PHYSICAL EXAM: VS:  There were no vitals taken for this visit. , BMI There is no height or weight on file to calculate BMI. Constitutional:  oriented to person, place, and time. No distress.  HENT:  Head: Grossly normal Eyes:  no discharge. No scleral icterus.  Neck: No JVD, no carotid bruits  Cardiovascular: Regular rate and rhythm, no murmurs appreciated Pulmonary/Chest: Clear to auscultation bilaterally, no wheezes or rails Abdominal: Soft.  no distension.  no tenderness.  Musculoskeletal: Normal range of motion Neurological:  normal muscle tone. Coordination normal. No atrophy Skin: Skin warm and dry Psychiatric: normal affect, pleasant   Recent Labs: 01/09/2021: ALT  30 11/28/2021: TSH 3.416 11/30/2021: BUN 9; Creatinine, Ser 0.94; Hemoglobin 16.0; Platelets 256; Potassium 3.8; Sodium 136    Lipid Panel Lab Results  Component Value Date   CHOL 179 11/28/2021   HDL 33 (L) 11/28/2021   LDLCALC 82 11/28/2021   TRIG 322 (H) 11/28/2021      Wt Readings from Last 3 Encounters:  11/27/21 222 lb 8 oz (100.9 kg)  08/03/21 219 lb 9.3 oz (99.6 kg)  07/30/21 219 lb 8 oz (99.6 kg)     ASSESSMENT AND PLAN:  Problem List Items Addressed This Visit   None Coronary artery disease with stable angina  prior stenting 2004, 2009 He does not take his Crestor since 2010 prefers to be on no medication We have ordered lipid panel Previously took himself off beta-blockers secondary to fatigue  Sleep apnea not using his CPAP Reports sleep disordered breathing better after weight loss  History of prostate cancer Followed by urology, PSA discussed  Obesity Weight down tremendously following a strict diet,  Active, does some work in [employer]  Hyperlipidemia Prefers not to be on a statin or prescription medication Lipid panel ordered    Total encounter time more than 30 minutes  Greater than 50% was spent in counseling and coordination of care with the patient    Signed, Esmond Plants, M.D., Ph.D. [place], [place]

## 2021-11-30 NOTE — Progress Notes (Signed)
ANTICOAGULATION CONSULT NOTE- Follow Up  Pharmacy Consult for heparin Indication: chest pain/ACS  Allergies  Allergen Reactions   Ace Inhibitors     cough   Beta Adrenergic Blockers     Fatigue   Codeine Other (See Comments)    Other reaction(s): Unknown   Lisinopril Cough   Statins     myalgias    Patient Measurements: Height: '5\' 8"'$  (172.7 cm) Weight: 100.9 kg (222 lb 8 oz) IBW/kg (Calculated) : 68.4 Heparin Dosing Weight: 87.8 kg   Vital Signs: Temp: 98.5 F (36.9 C) (11/25 2017) Temp Source: Oral (11/25 2017) BP: 148/87 (11/25 2017) Pulse Rate: 71 (11/25 2017)  Labs: Recent Labs    11/27/21 2359 11/28/21 0244 11/28/21 0244 11/28/21 0943 11/28/21 1108 11/28/21 1552 11/29/21 0134 11/30/21 0202 11/30/21 1228 11/30/21 2041  HGB  --  18.2*   < >  --   --   --  16.2 16.0  --   --   HCT  --  51.5  --   --   --   --  47.2 46.5  --   --   PLT  --  315  --   --   --   --  288 256  --   --   HEPARINUNFRC  --  0.16*   < > 0.39  --    < > 0.26* 0.24* 0.30 0.42  CREATININE  --   --   --   --   --   --  0.94 0.94  --   --   TROPONINIHS 741*  --   --  3,797* 4,207*  --   --   --   --   --    < > = values in this interval not displayed.     Estimated Creatinine Clearance: 84.2 mL/min (by C-G formula based on SCr of 0.94 mg/dL).   Medical History: Past Medical History:  Diagnosis Date   Cancer [facility])    prostate   Chest pain    Complication of anesthesia    fighting with waking up   Coronary artery disease    Heart attack [facility])    Hypercholesterolemia    Hyperlipidemia    Hypertension    Ischemic heart disease September 2004   Known--with non-Q-wave myocardial infarction    Obesity    Sleep apnea    Assessment: 71 year old male presenting with chest pain. Has history of NSTEMI (2004) s/p stents to RCA (2004) and LAD (2009). No history of anticoagulation PTA. Pharmacy has been consulted to dose heparin.  He is now s/p cath with failed attempt at PCI.  Heparin level came back therapeutic at 0.42, on 1850 units/hr. No s/sx of bleeding or infusion issues.   Goal of Therapy:  Heparin level 0.3-0.7 units/ml Monitor platelets by anticoagulation protocol: Yes   Plan: -Continue heparin at 1850 units/hr  -Monitor CBC and heparin level daily -Monitor for signs/symptoms of bleeding  [name], PharmD, [place] Clinical Pharmacist  Phone: [phone] 11/30/2021 9:34 PM  Please check AMION for all [place] phone numbers After 10:00 PM, call [place] [phone]

## 2021-12-01 DIAGNOSIS — I214 Non-ST elevation (NSTEMI) myocardial infarction: Secondary | ICD-10-CM | POA: Diagnosis not present

## 2021-12-01 LAB — CBC
HCT: 46.9 % (ref 39.0–52.0)
Hemoglobin: 16.5 g/dL (ref 13.0–17.0)
MCH: 29.7 pg (ref 26.0–34.0)
MCHC: 35.2 g/dL (ref 30.0–36.0)
MCV: 84.4 fL (ref 80.0–100.0)
Platelets: 248 10*3/uL (ref 150–400)
RBC: 5.56 MIL/uL (ref 4.22–5.81)
RDW: 13.4 % (ref 11.5–15.5)
WBC: 10 10*3/uL (ref 4.0–10.5)
nRBC: 0 % (ref 0.0–0.2)

## 2021-12-01 LAB — BASIC METABOLIC PANEL
Anion gap: 9 (ref 5–15)
BUN: 8 mg/dL (ref 8–23)
CO2: 25 mmol/L (ref 22–32)
Calcium: 8.8 mg/dL — ABNORMAL LOW (ref 8.9–10.3)
Chloride: 101 mmol/L (ref 98–111)
Creatinine, Ser: 1 mg/dL (ref 0.61–1.24)
GFR, Estimated: >60 mL/min (ref >60)
Glucose, Bld: 99 mg/dL (ref 70–99)
Potassium: 4.3 mmol/L (ref 3.5–5.1)
Sodium: 135 mmol/L (ref 135–145)

## 2021-12-01 LAB — HEPARIN LEVEL (UNFRACTIONATED): Heparin Unfractionated: 0.41 IU/mL (ref 0.30–0.70)

## 2021-12-01 MED ORDER — IRBESARTAN 150 MG PO TABS
300.0000 mg | ORAL_TABLET | Freq: Every day | ORAL | Status: DC
  Administered 2021-12-01 – 2021-12-05 (×5): 300 mg via ORAL
  Filled 2021-12-01 (×5): qty 2

## 2021-12-01 NOTE — Progress Notes (Signed)
ANTICOAGULATION CONSULT NOTE- Follow Up  Pharmacy Consult for heparin Indication: chest pain/ACS  Allergies  Allergen Reactions   Ace Inhibitors     cough   Beta Adrenergic Blockers     Fatigue   Codeine Other (See Comments)    Other reaction(s): Unknown   Lisinopril Cough   Statins     myalgias    Patient Measurements: Height: '5\' 8"'$  (172.7 cm) Weight: 100.9 kg (222 lb 8 oz) IBW/kg (Calculated) : 68.4 Heparin Dosing Weight: 87.8 kg   Vital Signs: Temp: 98.1 F (36.7 C) (11/26 0508) Temp Source: Oral (11/26 0508) BP: 141/93 (11/26 0508) Pulse Rate: 66 (11/26 0508)  Labs: Recent Labs    11/28/21 0943 11/28/21 1108 11/28/21 1552 11/29/21 0134 11/30/21 0202 11/30/21 1228 11/30/21 2041 12/01/21 0156  HGB  --   --    < > 16.2 16.0  --   --  16.5  HCT  --   --   --  47.2 46.5  --   --  46.9  PLT  --   --   --  288 256  --   --  248  HEPARINUNFRC 0.39  --    < > 0.26* 0.24* 0.30 0.42 0.41  CREATININE  --   --   --  0.94 0.94  --   --   --   TROPONINIHS 3,797* 4,207*  --   --   --   --   --   --    < > = values in this interval not displayed.     Estimated Creatinine Clearance: 84.2 mL/min (by C-G formula based on SCr of 0.94 mg/dL).   Medical History: Past Medical History:  Diagnosis Date   Cancer [facility])    prostate   Chest pain    Complication of anesthesia    fighting with waking up   Coronary artery disease    Heart attack [facility])    Hypercholesterolemia    Hyperlipidemia    Hypertension    Ischemic heart disease September 2004   Known--with non-Q-wave myocardial infarction    Obesity    Sleep apnea    Assessment: 71 year old male presenting with chest pain. Has history of NSTEMI (2004) s/p stents to RCA (2004) and LAD (2009). No history of anticoagulation PTA. Pharmacy has been consulted to dose heparin.  He is now s/p cath with failed attempt at PCI. Heparin level came back therapeutic at 0.41, on 1850 units/hr. No s/sx of bleeding or infusion  issues per RN. CBC stable   Goal of Therapy:  Heparin level 0.3-0.7 units/ml Monitor platelets by anticoagulation protocol: Yes   Plan: -Continue heparin at 1850 units/hr  -Monitor CBC and heparin level daily -Monitor for signs/symptoms of bleeding  Sandford Craze, PharmD. [facility] Acute Care PGY-1 12/01/2021 7:55 AM

## 2021-12-01 NOTE — Progress Notes (Signed)
Patient placed on full face cpap and tolerating well at this time.

## 2021-12-01 NOTE — Progress Notes (Signed)
Rounding Note    Patient Name: Keith Murillo Date of Encounter: 12/01/2021  [place] Cardiologist: Ida Rogue, MD   Subjective   Currently feeling well without chest pain or shortness of breath  Inpatient Medications    Scheduled Meds:  amLODipine  10 mg Oral Daily   aspirin EC  81 mg Oral Daily   cyanocobalamin  5,000 mcg Oral Daily   ezetimibe  10 mg Oral Daily   fluticasone  1 spray Each Nare Daily   irbesartan  150 mg Oral Daily   isosorbide mononitrate  60 mg Oral Daily   melatonin  5 mg Oral QHS   pneumococcal 20-valent conjugate vaccine  0.5 mL Intramuscular Tomorrow-1000   sodium chloride flush  3 mL Intravenous Q12H   thyroid  60 mg Oral q morning   ticagrelor  90 mg Oral BID   Continuous Infusions:  sodium chloride     heparin 1,850 Units/hr (12/01/21 0051)   PRN Meds: sodium chloride, acetaminophen, nitroGLYCERIN, ondansetron (ZOFRAN) IV, sodium chloride flush   Vital Signs    Vitals:   11/30/21 1752 11/30/21 2017 12/01/21 0011 12/01/21 0508  BP: 130/68 (!) 148/87 (!) 162/95 (!) 141/93  Pulse: 77 71 69 66  Resp: _0 Temp: 98.4 F (36.9 C) 98.5 F (36.9 C) 98.2 F (36.8 C) 98.1 F (36.7 C)  TempSrc: Oral Oral Axillary Oral  SpO2:      Weight:      Height:        Intake/Output Summary (Last 24 hours) at 12/01/2021 0807 Last data filed at 12/01/2021 2202 Gross per 24 hour  Intake 180 ml  Output 950 ml  Net -770 ml       11/27/2021   11:38 PM 11/27/2021    6:05 PM 08/03/2021    2:03 PM  Last 3 Weights  Weight (lbs) 222 lb 8 oz 220 lb 219 lb 9.3 oz  Weight (kg) 100.925 kg 99.791 kg 99.6 kg      Telemetry    Sinus rhythm-personally reviewed  ECG    None new  Physical Exam   GEN: Well nourished, well developed, in no acute distress  HEENT: normal  Neck: no JVD, carotid bruits, or masses Cardiac: RRR; no murmurs, rubs, or gallops,no edema  Respiratory:  clear to auscultation bilaterally, normal  work of breathing GI: soft, nontender, nondistended, + BS MS: no deformity or atrophy  Skin: warm and dry Neuro:  Strength and sensation are intact Psych: euthymic mood, full affect   Labs    High Sensitivity Troponin:   Recent Labs  Lab 11/27/21 1910 11/27/21 2135 11/27/21 2359 11/28/21 0943 11/28/21 1108  TROPONINIHS 610* 696* 741* 3,797* 4,207*      Chemistry Recent Labs  Lab 11/27/21 1700 11/29/21 0134 11/30/21 0202  NA 134* 139 136  K 3.9 3.6 3.8  CL 101 102 102  CO2 _1 GLUCOSE 95 130* 143*  BUN _2 CREATININE 0.85 0.94 0.94  CALCIUM 9.4 8.6* 8.7*  GFRNONAA >60 >60 >60  ANIONGAP _3 Lipids  Recent Labs  Lab 11/28/21 0244  CHOL 179  TRIG 322*  HDL 33*  LDLCALC 82  CHOLHDL 5.4     Hematology Recent Labs  Lab 11/29/21 0134 11/30/21 0202 12/01/21 0156  WBC 10.7* 10.6* 10.0  RBC 5.51 5.43 5.56  HGB 16.2 16.0 16.5  HCT 47.2 46.5 46.9  MCV 85.7 85.6 84.4  MCH 29.4 29.5 29.7  MCHC 34.3 34.4 35.2  RDW 13.4 13.5 13.4  PLT 288 256 248    Thyroid  Recent Labs  Lab 11/28/21 0244  TSH 3.416     BNPNo results for input(s): "BNP", "PROBNP" in the last 168 hours.  DDimer No results for input(s): "DDIMER" in the last 168 hours.   Radiology    No results found.  Cardiac Studies   Left heart cath   Prox RCA (distal edge of stent) is 90% stenosed. Prox RCA to Dist RCA lesion is 100% stenosed.   Dist LM to Ost LAD lesion is 55% stenosed.   Prox LAD to Mid LAD stent is 5% stenosed. 1st Sept lesion is 95% stenosed.   Mid LAD to Dist LAD stent is 5% stenosed.   Dist LAD-1 lesion is 80% stenosed. Dist LAD-2 lesion is 95% stenosed.   Ost Cx to Prox Cx lesion is 55% stenosed. Prox Cx lesion is 70% stenosed. 1st Mrg lesion is 70% stenosed.   Ostial LPAV lesion is 99% stenosed.  Heavily calcified focal lesion.   Balloon angioplasty was attempted using a BALLN EMERGE MR 2.0X8.  Unable to cross with wire.  Therefore PTC aborted.   Post intervention, there is a 99% residual stenosis.   There is mild to moderate left ventricular systolic dysfunction. The left ventricular ejection fraction is 35-45% by visual estimate.   LV end diastolic pressure is mildly elevated. - 15 mmHg   There is no aortic valve stenosis.  TTE  1. Left ventricular ejection fraction, by estimation, is 60 to 65%. The  left ventricle has normal function. The left ventricle has no regional  wall motion abnormalities. There is severe concentric left ventricular  hypertrophy. Left ventricular diastolic   parameters are consistent with Grade I diastolic dysfunction (impaired  relaxation).   2. Right ventricular systolic function is normal. The right ventricular  size is normal. Tricuspid regurgitation signal is inadequate for assessing  PA pressure.   3. Left atrial size was mildly dilated.   4. The mitral valve is normal in structure. Trivial mitral valve  regurgitation. No evidence of mitral stenosis.   5. The aortic valve is tricuspid. Aortic valve regurgitation is not  visualized. No aortic stenosis is present.   6. Aortic dilatation noted. There is mild dilatation of the aortic root,  measuring 40 mm.   Patient Profile     71 y.o. male history of coronary artery disease status post LAD and RCA PCI, hyperlipidemia, OSA not on CPAP presented to the hospital with chest pain.  Assessment & Plan    1.  Non-STEMI: Troponin elevation to 4207.  Currently on IV heparin without pain.  Catheterization with significant disease unable to cross the circumflex lesion.  There was a discussion amongst the interventionalists about plan of care.  Continue IV heparin through the weekend.  2.  Hypertension: Losartan stopped yesterday and started on Avapro and Norvasc increased to 10 mg.  Will increase Avapro today.  3.  Obstructive sleep apnea: CPAP compliance encouraged  4.  Hyperlipidemia: Continue Zetia.  Goal LDL less than 70.   For questions or  updates, please contact [facility] Please consult [email or web address] for contact info under        Signed, Will Martin Camnitz, MD  12/01/2021, 8:07 AM    

## 2021-12-02 ENCOUNTER — Ambulatory Visit: Payer: Medicare Other | Admitting: Cardiovascular Disease

## 2021-12-02 DIAGNOSIS — I700 Atherosclerosis of aorta: Secondary | ICD-10-CM

## 2021-12-02 DIAGNOSIS — I214 Non-ST elevation (NSTEMI) myocardial infarction: Secondary | ICD-10-CM | POA: Diagnosis not present

## 2021-12-02 DIAGNOSIS — I25118 Atherosclerotic heart disease of native coronary artery with other forms of angina pectoris: Secondary | ICD-10-CM

## 2021-12-02 DIAGNOSIS — E782 Mixed hyperlipidemia: Secondary | ICD-10-CM

## 2021-12-02 LAB — CBC
HCT: 48.3 % (ref 39.0–52.0)
Hemoglobin: 16.6 g/dL (ref 13.0–17.0)
MCH: 29.1 pg (ref 26.0–34.0)
MCHC: 34.4 g/dL (ref 30.0–36.0)
MCV: 84.7 fL (ref 80.0–100.0)
Platelets: 261 10*3/uL (ref 150–400)
RBC: 5.7 MIL/uL (ref 4.22–5.81)
RDW: 13.6 % (ref 11.5–15.5)
WBC: 10.9 10*3/uL — ABNORMAL HIGH (ref 4.0–10.5)
nRBC: 0 % (ref 0.0–0.2)

## 2021-12-02 LAB — BASIC METABOLIC PANEL
Anion gap: 9 (ref 5–15)
BUN: 15 mg/dL (ref 8–23)
CO2: 23 mmol/L (ref 22–32)
Calcium: 8.9 mg/dL (ref 8.9–10.3)
Chloride: 106 mmol/L (ref 98–111)
Creatinine, Ser: 1.04 mg/dL (ref 0.61–1.24)
GFR, Estimated: >60 mL/min (ref >60)
Glucose, Bld: 116 mg/dL — ABNORMAL HIGH (ref 70–99)
Potassium: 4.1 mmol/L (ref 3.5–5.1)
Sodium: 138 mmol/L (ref 135–145)

## 2021-12-02 LAB — HEPARIN LEVEL (UNFRACTIONATED): Heparin Unfractionated: 0.41 IU/mL (ref 0.30–0.70)

## 2021-12-02 MED FILL — Nitroglycerin IV Soln 100 MCG/ML in D5W: INTRA_ARTERIAL | Qty: 10 | Status: AC

## 2021-12-02 NOTE — Progress Notes (Addendum)
Rounding Note    Patient Name: Keith Murillo Date of Encounter: 12/02/2021  [place] Cardiologist: Ida Rogue, MD   Subjective   No chest pain.   Inpatient Medications    Scheduled Meds:  amLODipine  10 mg Oral Daily   aspirin EC  81 mg Oral Daily   cyanocobalamin  5,000 mcg Oral Daily   ezetimibe  10 mg Oral Daily   fluticasone  1 spray Each Nare Daily   irbesartan  300 mg Oral Daily   isosorbide mononitrate  60 mg Oral Daily   melatonin  5 mg Oral QHS   pneumococcal 20-valent conjugate vaccine  0.5 mL Intramuscular Tomorrow-1000   sodium chloride flush  3 mL Intravenous Q12H   thyroid  60 mg Oral q morning   ticagrelor  90 mg Oral BID   Continuous Infusions:  sodium chloride     heparin 1,850 Units/hr (12/02/21 0407)   PRN Meds: sodium chloride, acetaminophen, nitroGLYCERIN, ondansetron (ZOFRAN) IV, sodium chloride flush   Vital Signs    Vitals:   12/01/21 2345 12/02/21 0019 12/02/21 0452 12/02/21 0800  BP:  129/66 (!) 119/90 (!) 134/93  Pulse: 72 71 74 70  Resp: _0 Temp:  97.9 F (36.6 C) 98.3 F (36.8 C) 98 F (36.7 C)  TempSrc:  Oral Axillary Oral  SpO2: 95%     Weight:      Height:        Intake/Output Summary (Last 24 hours) at 12/02/2021 1019 Last data filed at 12/01/2021 2154 Gross per 24 hour  Intake 360 ml  Output --  Net 360 ml      11/27/2021   11:38 PM 11/27/2021    6:05 PM 08/03/2021    2:03 PM  Last 3 Weights  Weight (lbs) 222 lb 8 oz 220 lb 219 lb 9.3 oz  Weight (kg) 100.925 kg 99.791 kg 99.6 kg      Telemetry    Sinus Rhythm - Personally Reviewed  ECG    No new tracing  Physical Exam   GEN: No acute distress.   Neck: No JVD Cardiac: RRR, no murmurs, rubs, or gallops.  Respiratory: Clear to auscultation bilaterally. GI: Soft, nontender, non-distended  MS: No edema; No deformity. Neuro:  Nonfocal  Psych: Normal affect   Labs    High Sensitivity Troponin:   Recent Labs  Lab  11/27/21 1910 11/27/21 2135 11/27/21 2359 11/28/21 0943 11/28/21 1108  TROPONINIHS 610* 696* 741* 3,797* 4,207*     Chemistry Recent Labs  Lab 11/30/21 0202 11/30/21 1228 12/02/21 0236  NA 136 135 138  K 3.8 4.3 4.1  CL 102 101 106  CO2 _1 GLUCOSE 143* 99 116*  BUN _2 CREATININE 0.94 1.00 1.04  CALCIUM 8.7* 8.8* 8.9  GFRNONAA >60 >60 >60  ANIONGAP _3 Lipids  Recent Labs  Lab 11/28/21 0244  CHOL 179  TRIG 322*  HDL 33*  LDLCALC 82  CHOLHDL 5.4    Hematology Recent Labs  Lab 11/30/21 0202 12/01/21 0156 12/02/21 0236  WBC 10.6* 10.0 10.9*  RBC 5.43 5.56 5.70  HGB 16.0 16.5 16.6  HCT 46.5 46.9 48.3  MCV 85.6 84.4 84.7  MCH 29.5 29.7 29.1  MCHC 34.4 35.2 34.4  RDW 13.5 13.4 13.6  PLT 256 248 261   Thyroid  Recent Labs  Lab 11/28/21 0244  TSH 3.416    BNPNo results for input(s): "  BNP", "PROBNP" in the last 168 hours.  DDimer No results for input(s): "DDIMER" in the last 168 hours.   Radiology    No results found.  Cardiac Studies   Cath: 11/29/21    Prox RCA (distal edge of stent) is 90% stenosed. Prox RCA to Dist RCA lesion is 100% stenosed.   Dist LM to Ost LAD lesion is 55% stenosed.   Prox LAD to Mid LAD stent is 5% stenosed. 1st Sept lesion is 95% stenosed.   Mid LAD to Dist LAD stent is 5% stenosed.   Dist LAD-1 lesion is 80% stenosed. Dist LAD-2 lesion is 95% stenosed.   Ost Cx to Prox Cx lesion is 55% stenosed. Prox Cx lesion is 70% stenosed. 1st Mrg lesion is 70% stenosed.   Ostial LPAV lesion is 99% stenosed.  Heavily calcified focal lesion.   Balloon angioplasty was attempted using a BALLN EMERGE MR 2.0X8.  Unable to cross with wire.  Therefore PTC aborted.  Post intervention, there is a 99% residual stenosis.   There is mild to moderate left ventricular systolic dysfunction. The left ventricular ejection fraction is 35-45% by visual estimate.   LV end diastolic pressure is mildly elevated. - 15 mmHg   There is no  aortic valve stenosis.   POST-OPERATIVE DIAGNOSIS:   Severe Multivessel CAD: Proximal RCA stent patent until the distal proximal stent is 100% stenosed with bridging collaterals filling marginal branches and faint left-to-right collaterals.  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  The superior OM1 branch is smaller with an ostial 60 to 70% stenosis.  The inferior branch is larger with proximal 20% stenosis. Ostial LAD roughly 50% followed by an ectatic segment just prior to SP1 which has eccentric 90% stenosis.  Just after SP1 there is a patent stent with trivial ISR.  There is mild diffuse disease leading up to the more distal stent that is patent but with distal edge 80% lesion followed by diffuse disease and a 95% focal lesion at the apex before the vessel wraps around the apex perfusing the distal third of the PDA territory. Poor imaging on LV gram, however there appears to be mid to apical anterior hypokinesis as well as inferior hypokinesis.  EF estimated 45%.   PLAN OF CARE:  Return to nursing for ongoing care.  Restart IV heparin after TR band removal.  Continue to titrate antianginal medications.  We will discuss with interventional colleagues to determine if other options that are available patient has been transferred to Team C.  Would proceed with 2D echo.. I have added low-dose amlodipine given his hypertension and need for antianginal therapy.  Apparently there was an issue with beta-blockers.    Glenetta Hew, MD  Diagnostic Dominance: Right  Intervention    Patient Profile     71 y.o. male with history of coronary  artery disease status post LAD and RCA PCI, hyperlipidemia, OSA not on CPAP presented to the hospital with chest pain.   Assessment & Plan    NSTEMI -- presented with chest pain, hsTn peaked at 4207. Underwent cardiac cath noted above with difficulty crossing Lcx lesion, significant 3v disease. Cath films were  reviewed by MD with recommendations for TCTS consult. Echo showed LVEF of 60-65%, severe LVH, g1dd. -- received Brilinta post cath, will stop this morning -- continue ASA, amlodipine, Imdur, avapro  HTN -- overall controlled -- continue amlodipine, avapro, Imdur  HLD -- on Zetia, statin intolerant  For questions or updates, please contact [place] Please consult [email or web address] for contact info under        Signed, Reino Bellis, NP  12/02/2021, 10:19 AM     Agree with note by Reino Bellis NP-C  Patient admitted to the hospital with non-STEMI.  Troponins elevated at 4200.  History of prior RCA and LAD stenting.  LV function was normal with grade 1 diastolic dysfunction.  He said no residual chest pain on IV heparin.  He was loaded with antibiotic therapy.  He had a attempt circumflex intervention by Dr. Ellyn Hack which was unsuccessful.  He does have an occluded RCA with left-to-right collaterals.  He has 60 to 70% ostial LAD disease and significant calcified ostial circumflex disease, first obtuse marginal branch disease and AV groove circumflex disease just beyond the marginal takeoff.  This supplies several small posterolateral branches.  At this point, I do not feel repeat intervention of the a degree circumflex is warranted.  I have asked for TCTS to consult for consideration of CABG.  The only issue is whether the ostial LAD is physiologically significant.  For the time being we will continue IV heparin.  Lorretta Harp, M.D., [place], [facility], [place] [address] [place], [place]   [id number]  [phone] 12/02/2021 1:59 PM

## 2021-12-02 NOTE — Plan of Care (Signed)
  Problem: Education: Goal: Knowledge of General Education information will improve Description: Including pain rating scale, medication(s)/side effects and non-pharmacologic comfort measures Outcome: Progressing   Problem: Health Behavior/Discharge Planning: Goal: Ability to manage health-related needs will improve Outcome: Progressing   Problem: Clinical Measurements: Goal: Ability to maintain clinical measurements within normal limits will improve Outcome: Progressing Goal: Will remain free from infection Outcome: Progressing Goal: Cardiovascular complication will be avoided Outcome: Progressing   Problem: Activity: Goal: Risk for activity intolerance will decrease Outcome: Progressing   Problem: Nutrition: Goal: Adequate nutrition will be maintained Outcome: Progressing   Problem: Elimination: Goal: Will not experience complications related to bowel motility Outcome: Progressing

## 2021-12-02 NOTE — Progress Notes (Signed)
ANTICOAGULATION CONSULT NOTE- Follow Up  Pharmacy Consult for heparin Indication: chest pain/ACS  Allergies  Allergen Reactions   Ace Inhibitors     cough   Beta Adrenergic Blockers     Fatigue   Codeine Other (See Comments)    Other reaction(s): Unknown   Lisinopril Cough   Statins     myalgias    Patient Measurements: Height: '5\' 8"'$  (172.7 cm) Weight: 100.9 kg (222 lb 8 oz) IBW/kg (Calculated) : 68.4 Heparin Dosing Weight: 87.8 kg   Vital Signs: Temp: 98.3 F (36.8 C) (11/27 0452) Temp Source: Axillary (11/27 0452) BP: 119/90 (11/27 0452) Pulse Rate: 74 (11/27 0452)  Labs: Recent Labs    11/30/21 0202 11/30/21 1228 11/30/21 2041 12/01/21 0156 12/02/21 0236  HGB 16.0  --   --  16.5 16.6  HCT 46.5  --   --  46.9 48.3  PLT 256  --   --  248 261  HEPARINUNFRC 0.24* 0.30 0.42 0.41 0.41  CREATININE 0.94 1.00  --   --  1.04     Estimated Creatinine Clearance: 76.1 mL/min (by C-G formula based on SCr of 1.04 mg/dL).   Medical History: Past Medical History:  Diagnosis Date   Cancer [facility])    prostate   Chest pain    Complication of anesthesia    fighting with waking up   Coronary artery disease    Heart attack [facility])    Hypercholesterolemia    Hyperlipidemia    Hypertension    Ischemic heart disease September 2004   Known--with non-Q-wave myocardial infarction    Obesity    Sleep apnea    Assessment: 71 year old male presenting with chest pain. Has history of NSTEMI (2004) s/p stents to RCA (2004) and LAD (2009). No history of anticoagulation PTA. Pharmacy has been consulted to dose heparin.  He is now s/p cath with failed attempt at PCI. Heparin level continues to be at goal (0.41) on 1850 units/hr. No s/sx of bleeding or infusion issues per RN. CBC stable   Goal of Therapy:  Heparin level 0.3-0.7 units/ml Monitor platelets by anticoagulation protocol: Yes   Plan: Continue heparin at 1850 units/hr  Will follow up anticoagulation plan Monitor CBC  and heparin level daily Monitor for signs/symptoms of bleeding  Erin Hearing PharmD., BCPS Clinical Pharmacist 12/02/2021 8:16 AM

## 2021-12-02 NOTE — TOC Initial Note (Signed)
Transition of Care [facility]) - Initial/Assessment Note    Patient Details  Name: Keith Murillo MRN: [id number] Date of Birth: December 15, 1950  Transition of Care [facility]) CM/SW Contact:    Bethena Roys, RN Phone Number: 12/02/2021, 3:16 PM  Clinical Narrative:  Patient presented for chest pain- post left heart cath. No home needs identified at this time. Case Manager will continue to follow for transition of care needs.                Expected Discharge Plan: Home/Self Care Barriers to Discharge: No Barriers Identified  Patient Goals and CMS Choice Patient states their goals for this hospitalization and ongoing recovery are:: to return home.   Expected Discharge Plan and Services Expected Discharge Plan: Home/Self Care In-house Referral: NA Discharge Planning Services: CM Consult   Living arrangements for the past 2 months: Single Family Home  Prior Living Arrangements/Services Living arrangements for the past 2 months: Single Family Home Lives with:: Spouse Patient language and need for interpreter reviewed:: Yes        Need for Family Participation in Patient Care: No (Comment) Care giver support system in place?: No (comment)   Criminal Activity/Legal Involvement Pertinent to Current Situation/Hospitalization: No - Comment as needed  Activities of Daily Living Home Assistive Devices/Equipment: None ADL Screening (condition at time of admission) Patient's cognitive ability adequate to safely complete daily activities?: Yes Is the patient deaf or have difficulty hearing?: Yes Does the patient have difficulty seeing, even when wearing glasses/contacts?: No Does the patient have difficulty concentrating, remembering, or making decisions?: No Patient able to express need for assistance with ADLs?: Yes Does the patient have difficulty dressing or bathing?: No Independently performs ADLs?: Yes (appropriate for developmental age) Does the patient have difficulty  walking or climbing stairs?: Yes Weakness of Legs: None Weakness of Arms/Hands: None  Permission Sought/Granted Permission sought to share information with : Case Manager, Family Supports   Emotional Assessment Appearance:: Appears stated age       Alcohol / Substance Use: Not Applicable Psych Involvement: No (comment)  Admission diagnosis:  NSTEMI (non-ST elevated myocardial infarction) [facility]) [I21.4] Patient Active Problem List   Diagnosis Date Noted   NSTEMI (non-ST elevated myocardial infarction) ([place]) 11/27/2021   Diarrhea 01/09/2021   Aortic atherosclerosis ([place]) 01/09/2021   Abnormal CBC 01/09/2021   Hypokalemia 01/09/2021   History of pneumonia 01/09/2021   Benign prostatic hyperplasia with lower urinary tract symptoms 08/31/2019   Erectile dysfunction due to arterial insufficiency 07/30/2018   Sepsis ([place]) 03/12/2018   Right-sided chest wall pain 03/12/2018   Prostate cancer ([place]) 08/03/2017   Chronic venous insufficiency 07/20/2017   Lymphedema 07/20/2017   Hyperlipidemia 07/17/2017   Varicose veins of both lower extremities with complications 47/42/5956   Coronary artery disease involving native heart without angina pectoris 01/30/2017   Hypogonadism in male 12/05/2016   PCP:  [name], FNP Pharmacy:   [facility] # [address], [place] [address] Phone: [phone] Fax: [phone]  [place], [place] - [place] [address] Phone: [phone] Fax: [phone]  [id number] [address] [place]: [phone]: [address] [place]Phone: [phone]: [phone]  [id number] [place] [address] [place]Phone: [phone]: [phone] Readmission Risk Interventions     No data to display

## 2021-12-02 NOTE — Progress Notes (Signed)
CARDIAC REHAB PHASE I      Stopped by to offer walk. Pt would like to walk after lunch. Will return later today as time permits. Will continue to follow.    [id number] Vanessa Barbara, RN BSN 12/02/2021 2:25 PM

## 2021-12-03 ENCOUNTER — Inpatient Hospital Stay (HOSPITAL_COMMUNITY): Payer: Medicare Other

## 2021-12-03 DIAGNOSIS — Z01810 Encounter for preprocedural cardiovascular examination: Secondary | ICD-10-CM | POA: Diagnosis not present

## 2021-12-03 DIAGNOSIS — I2510 Atherosclerotic heart disease of native coronary artery without angina pectoris: Secondary | ICD-10-CM | POA: Diagnosis not present

## 2021-12-03 DIAGNOSIS — I214 Non-ST elevation (NSTEMI) myocardial infarction: Secondary | ICD-10-CM | POA: Diagnosis not present

## 2021-12-03 LAB — CBC
HCT: 47 % (ref 39.0–52.0)
Hemoglobin: 16.3 g/dL (ref 13.0–17.0)
MCH: 29.8 pg (ref 26.0–34.0)
MCHC: 34.7 g/dL (ref 30.0–36.0)
MCV: 85.9 fL (ref 80.0–100.0)
Platelets: 281 10*3/uL (ref 150–400)
RBC: 5.47 MIL/uL (ref 4.22–5.81)
RDW: 13.6 % (ref 11.5–15.5)
WBC: 10.1 10*3/uL (ref 4.0–10.5)
nRBC: 0 % (ref 0.0–0.2)

## 2021-12-03 LAB — BASIC METABOLIC PANEL
Anion gap: 11 (ref 5–15)
BUN: 17 mg/dL (ref 8–23)
CO2: 22 mmol/L (ref 22–32)
Calcium: 8.9 mg/dL (ref 8.9–10.3)
Chloride: 102 mmol/L (ref 98–111)
Creatinine, Ser: 1.08 mg/dL (ref 0.61–1.24)
GFR, Estimated: >60 mL/min (ref >60)
Glucose, Bld: 116 mg/dL — ABNORMAL HIGH (ref 70–99)
Potassium: 4.1 mmol/L (ref 3.5–5.1)
Sodium: 135 mmol/L (ref 135–145)

## 2021-12-03 LAB — HEPARIN LEVEL (UNFRACTIONATED): Heparin Unfractionated: 0.63 IU/mL (ref 0.30–0.70)

## 2021-12-03 NOTE — H&P (View-Only) (Signed)
Reason for Consult: Three-vessel coronary disease Referring Physician: Dr. Owens Shark is an 71 y.o. male.  HPI: Presented with chest pain.  Keith Murillo is a 71 year old man with a past medical history significant for CAD, MI, stents to LAD and RCA (2004 in 2009), hypertension, hyperlipidemia, hypothyroidism, obesity, sleep apnea, prostate cancer.  He presented last week with sudden onset of left-sided chest tightness radiating to his left arm.  He had an episode the evening before admission and took a baby aspirin.  His pain resolved.  Then he had another episode the following day which led to him seeking medical care.  The symptoms were similar to the symptoms he had prior to his previous stents.  In retrospect he been having milder episodes of this over the past 6 months or so.  His troponin was elevated initially at 397 and peaked at 4207.  He was admitted and started on intravenous heparin.  He underwent cardiac catheterization on 11/29/2021 which showed severe three-vessel disease.  An attempt was made to angioplasty a large bifurcating OM branch, but was unsuccessful.    He has been pain-free since admission.  He was on Brilinta and received his last dose on the evening of Sunday, 12/01/2021.  Past Medical History:  Diagnosis Date   Cancer [facility])    prostate   Chest pain    Complication of anesthesia    fighting with waking up   Coronary artery disease    Heart attack [facility])    Hypercholesterolemia    Hyperlipidemia    Hypertension    Ischemic heart disease September 2004   Known--with non-Q-wave myocardial infarction    Obesity    Sleep apnea     Past Surgical History:  Procedure Laterality Date   CARDIAC CATHETERIZATION  11/2007   INGUINAL HERNIA REPAIR Right 09/22/2019   Procedure: RIGHT INGUINAL HERNIA WITH MESH;  Surgeon: Erroll Luna, MD;  Location: [facility];  Service: General;  Laterality: Right;  TAP BLOCK   LEFT HEART CATH AND CORONARY  ANGIOGRAPHY N/A 11/29/2021   Procedure: LEFT HEART CATH AND CORONARY ANGIOGRAPHY;  Surgeon: Leonie Man, MD;  Location: [facility];  Service: Cardiovascular;  Laterality: N/A;   TEE WITHOUT CARDIOVERSION N/A 03/16/2018   Procedure: TRANSESOPHAGEAL ECHOCARDIOGRAM (TEE);  Surgeon: Acie Fredrickson Wonda Cheng, MD;  Location: [facility] ENDOSCOPY;  Service: Cardiovascular;  Laterality: N/A;    Family History  Problem Relation Age of Onset   Cancer Mother    Hearing loss Father    Cancer Maternal Grandmother    Parkinson's disease Maternal Grandfather    Stroke Maternal Grandfather    Stroke Paternal Grandmother    Heart attack Paternal Grandfather     Social History:  reports that he has never smoked. He has never used smokeless tobacco. He reports current alcohol use. He reports that he does not use drugs.  Allergies:  Allergies  Allergen Reactions   Ace Inhibitors     cough   Beta Adrenergic Blockers     Fatigue   Codeine Other (See Comments)    Other reaction(s): Unknown   Lisinopril Cough   Statins     myalgias    Medications: Scheduled:  amLODipine  10 mg Oral Daily   aspirin EC  81 mg Oral Daily   cyanocobalamin  5,000 mcg Oral Daily   ezetimibe  10 mg Oral Daily   fluticasone  1 spray Each Nare Daily   irbesartan  300 mg Oral Daily  isosorbide mononitrate  60 mg Oral Daily   melatonin  5 mg Oral QHS   sodium chloride flush  3 mL Intravenous Q12H   thyroid  60 mg Oral q morning    Results for orders placed or performed during the hospital encounter of 11/27/21 (from the past 48 hour(s))  CBC     Status: Abnormal   Collection Time: 12/02/21  2:36 AM  Result Value Ref Range   WBC 10.9 (H) 4.0 - 10.5 K/uL   RBC 5.70 4.22 - 5.81 MIL/uL   Hemoglobin 16.6 13.0 - 17.0 g/dL   HCT 48.3 39.0 - 52.0 %   MCV 84.7 80.0 - 100.0 fL   MCH 29.1 26.0 - 34.0 pg   MCHC 34.4 30.0 - 36.0 g/dL   RDW 13.6 11.5 - 15.5 %   Platelets 261 150 - 400 K/uL   nRBC 0.0 0.0 - 0.2 %    Comment:  Performed at [facility], [place] [address], [place], [place] [address]  Heparin level (unfractionated)     Status: None   Collection Time: 12/02/21  2:36 AM  Result Value Ref Range   Heparin Unfractionated 0.41 0.30 - 0.70 IU/mL    Comment: (NOTE) The clinical reportable range upper limit is being lowered to >1.10 to align with the FDA approved guidance for the current laboratory assay.  If heparin results are below expected values, and patient dosage has  been confirmed, suggest follow up testing of antithrombin III levels. Performed at [facility], [place] [address] [place], [place] [address]   Basic metabolic panel     Status: Abnormal   Collection Time: 12/02/21  2:36 AM  Result Value Ref Range   Sodium 138 135 - 145 mmol/L   Potassium 4.1 3.5 - 5.1 mmol/L   Chloride 106 98 - 111 mmol/L   CO2 23 22 - 32 mmol/L   Glucose, Bld 116 (H) 70 - 99 mg/dL    Comment: Glucose reference range applies only to samples taken after fasting for at least 8 hours.   BUN 15 8 - 23 mg/dL   Creatinine, Ser 1.04 0.61 - 1.24 mg/dL   Calcium 8.9 8.9 - 10.3 mg/dL   GFR, Estimated >60 >60 mL/min    Comment: (NOTE) Calculated using the CKD-EPI Creatinine Equation (2021)    Anion gap 9 5 - 15    Comment: Performed at [place] [address] [place] [address]  CBC     Status: None   Collection Time: 12/03/21  2:45 AM  Result Value Ref Range   WBC 10.1 4.0 - 10.5 K/uL   RBC 5.47 4.22 - 5.81 MIL/uL   Hemoglobin 16.3 13.0 - 17.0 g/dL   HCT 47.0 39.0 - 52.0 %   MCV 85.9 80.0 - 100.0 fL   MCH 29.8 26.0 - 34.0 pg   MCHC 34.7 30.0 - 36.0 g/dL   RDW 13.6 11.5 - 15.5 %   Platelets 281 150 - 400 K/uL   nRBC 0.0 0.0 - 0.2 %    Comment: Performed at [facility], [place] [address], [place], [place] [address]  Heparin level (unfractionated)     Status: None   Collection Time: 12/03/21  2:45 AM  Result Value Ref Range   Heparin Unfractionated 0.63 0.30 - 0.70 IU/mL     Comment: (NOTE) The clinical reportable range upper limit is being lowered to >1.10 to align with the FDA approved guidance for  the current laboratory assay.  If heparin results are below expected values, and patient dosage has  been confirmed, suggest follow up testing of antithrombin III levels. Performed at [facility], [place] [address], [place], [place] [address]   Basic metabolic panel     Status: Abnormal   Collection Time: 12/03/21  2:45 AM  Result Value Ref Range   Sodium 135 135 - 145 mmol/L   Potassium 4.1 3.5 - 5.1 mmol/L   Chloride 102 98 - 111 mmol/L   CO2 22 22 - 32 mmol/L   Glucose, Bld 116 (H) 70 - 99 mg/dL    Comment: Glucose reference range applies only to samples taken after fasting for at least 8 hours.   BUN 17 8 - 23 mg/dL   Creatinine, Ser 1.08 0.61 - 1.24 mg/dL   Calcium 8.9 8.9 - 10.3 mg/dL   GFR, Estimated >60 >60 mL/min    Comment: (NOTE) Calculated using the CKD-EPI Creatinine Equation (2021)    Anion gap 11 5 - 15    Comment: Performed at [facility] [address] [place], [place] [address]   Cardiac catheterization 11/29/2021   Prox RCA (distal edge of stent) is 90% stenosed. Prox RCA to Dist RCA lesion is 100% stenosed.   Dist LM to Ost LAD lesion is 55% stenosed.   Prox LAD to Mid LAD stent is 5% stenosed. 1st Sept lesion is 95% stenosed.   Mid LAD to Dist LAD stent is 5% stenosed.   Dist LAD-1 lesion is 80% stenosed. Dist LAD-2 lesion is 95% stenosed.   Ost Cx to Prox Cx lesion is 55% stenosed. Prox Cx lesion is 70% stenosed. 1st Mrg lesion is 70% stenosed.   Ostial LPAV lesion is 99% stenosed.  Heavily calcified focal lesion.   Balloon angioplasty was attempted using a BALLN EMERGE MR 2.0X8.  Unable to cross with wire.  Therefore PTC aborted.  Post intervention, there is a 99% residual stenosis.   There is mild to moderate left ventricular systolic dysfunction. The left ventricular ejection fraction is 35-45% by visual estimate.    LV end diastolic pressure is mildly elevated. - 15 mmHg   There is no aortic valve stenosis.  I personally reviewed the cardiac catheterization images.  There is severe three-vessel disease.  EF 35 to 40%.  Review of Systems Blood pressure 119/71, pulse 74, temperature 98 F (36.7 C), temperature source Oral, resp. rate 18, height _0  (1.727 m), weight 100.9 kg, SpO2 97 %. Physical Exam Constitutional:      General: He is not in acute distress.    Appearance: Normal appearance.  HENT:     Head: Normocephalic and atraumatic.  Eyes:     General: No scleral icterus.    Extraocular Movements: Extraocular movements intact.  Neck:     Vascular: No carotid bruit.  Cardiovascular:     Rate and Rhythm: Normal rate and regular rhythm.     Pulses: Normal pulses.     Heart sounds: Normal heart sounds. No murmur heard.    No friction rub. No gallop.  Pulmonary:     Effort: Pulmonary effort is normal.     Breath sounds: Normal breath sounds.  Abdominal:     General: There is no distension.     Palpations: Abdomen is soft.  Musculoskeletal:     Cervical back: Neck supple.  Lymphadenopathy:     Cervical: No cervical adenopathy.  Skin:    General: Skin is warm  and dry.     Comments: Varicose veins both calves  Neurological:     General: No focal deficit present.     Mental Status: He is alert and oriented to person, place, and time.     Cranial Nerves: No cranial nerve deficit.     Motor: No weakness.     Assessment/Plan: Keith Murillo is a 71 year old man with a past medical history significant for CAD, MI, stents to LAD and RCA (2004 in 2009), hypertension, hyperlipidemia, hypothyroidism, obesity, sleep apnea, prostate cancer.  He presented with unstable chest pain and ruled in for a non-ST elevation MI.  The catheterization he was found to have severe three-vessel coronary disease.  Three-vessel CAD-has complex disease and multiple previous percutaneous interventions.   His right coronary really has no evidence of a graftable target.  He has a large OM that bifurcates but potentially as a site for 2 grafts, likely the LIMA.  The AV groove circumflex is not graftable.  His LAD has some moderate disease proximally and then very severe disease distally at the apex.  In between the vessel has extensive stenting.  I suspect that we could get a graft in there but likely would use a vein graft due to the lack of a tight proximal stenosis.  I discussed the potential option of coronary bypass grafting with Keith Murillo.  He understands that his anatomy is not the most favorable.  He would be at higher than normal risk for early or late graft occlusion.  However is far more likely to provide symptom relief than medical therapy.  I informed him of the general nature of the procedure including the need for general anesthesia, the incisions to be used, the use of cardiopulmonary bypass, the use of drainage tubes and pacing wires postoperatively, the expected hospital stay, and the overall recovery.  I informed him of the indications, risks, benefits, and alternatives.  He understands the risks include, but not limited to death, MI, DVT, PE, bleeding, possible need for transfusion, infection, cardiac arrhythmias, respiratory renal failure, as well as possibility of other unforeseeable complications.  He accepts the risk and wishes to proceed with surgery.  He was on Brilinta until Sunday evening.  Options are to wait until Monday for a full 7-day washout or proceed on Friday, which would be 4 days off Brilinta and likely would have enough platelet function to avoid excessive risk of bleeding.  He does understand that his risk of bleeding would be a little higher on Friday than Monday but he wishes to proceed to soon as possible.  Keith Murillo 12/03/2021, 5:52 PM

## 2021-12-03 NOTE — Progress Notes (Signed)
CARDIAC REHAB PHASE I     Pre OHS education including sternal precautions, move in the tub, early ambulation, OHS handout and booklet, IS use and pain management and home needs at discharge reviewed. All questions and concerns addressed. IS today reached 2000. Will continue to follow.   [id number]  Vanessa Barbara, RN BSN 12/03/2021 2:16 PM

## 2021-12-03 NOTE — Progress Notes (Addendum)
Rounding Note    Patient Name: Keith Murillo Date of Encounter: 12/03/2021  [place] Cardiologist: Ida Rogue, MD   Subjective   No chest pain this morning. Just spoke with Dr. Roxan Hockey regarding plans for CABG on 12/1.   Inpatient Medications    Scheduled Meds:  amLODipine  10 mg Oral Daily   aspirin EC  81 mg Oral Daily   cyanocobalamin  5,000 mcg Oral Daily   ezetimibe  10 mg Oral Daily   fluticasone  1 spray Each Nare Daily   irbesartan  300 mg Oral Daily   isosorbide mononitrate  60 mg Oral Daily   melatonin  5 mg Oral QHS   sodium chloride flush  3 mL Intravenous Q12H   thyroid  60 mg Oral q morning   Continuous Infusions:  sodium chloride     heparin 1,850 Units/hr (12/03/21 0709)   PRN Meds: sodium chloride, acetaminophen, nitroGLYCERIN, ondansetron (ZOFRAN) IV, sodium chloride flush   Vital Signs    Vitals:   12/02/21 1226 12/02/21 1349 12/02/21 2049 12/03/21 0556  BP: (!) 161/85 133/73 127/68 127/81  Pulse:  80 83 73  Resp:  _0 Temp:  98.1 F (36.7 C) 97.6 F (36.4 C) 98.1 F (36.7 C)  TempSrc:  Oral Oral Oral  SpO2:  97% 97% 95%  Weight:      Height:        Intake/Output Summary (Last 24 hours) at 12/03/2021 0820 Last data filed at 12/02/2021 2051 Gross per 24 hour  Intake 360 ml  Output --  Net 360 ml      11/27/2021   11:38 PM 11/27/2021    6:05 PM 08/03/2021    2:03 PM  Last 3 Weights  Weight (lbs) 222 lb 8 oz 220 lb 219 lb 9.3 oz  Weight (kg) 100.925 kg 99.791 kg 99.6 kg      Telemetry    Sinus Rhythm - Personally Reviewed  ECG    No new tracing  Physical Exam   GEN: No acute distress.   Neck: No JVD Cardiac: RRR, no murmurs, rubs, or gallops.  Respiratory: Clear to auscultation bilaterally. GI: Soft, nontender, non-distended  MS: No edema; No deformity. Neuro:  Nonfocal  Psych: Normal affect   Labs    High Sensitivity Troponin:   Recent Labs  Lab 11/27/21 1910 11/27/21 2135  11/27/21 2359 11/28/21 0943 11/28/21 1108  TROPONINIHS 610* 696* 741* 3,797* 4,207*     Chemistry Recent Labs  Lab 11/30/21 1228 12/02/21 0236 12/03/21 0245  NA 135 138 135  K 4.3 4.1 4.1  CL 101 106 102  CO2 _1 GLUCOSE 99 116* 116*  BUN _2 CREATININE 1.00 1.04 1.08  CALCIUM 8.8* 8.9 8.9  GFRNONAA >60 >60 >60  ANIONGAP _3 Lipids  Recent Labs  Lab 11/28/21 0244  CHOL 179  TRIG 322*  HDL 33*  LDLCALC 82  CHOLHDL 5.4    Hematology Recent Labs  Lab 12/01/21 0156 12/02/21 0236 12/03/21 0245  WBC 10.0 10.9* 10.1  RBC 5.56 5.70 5.47  HGB 16.5 16.6 16.3  HCT 46.9 48.3 47.0  MCV 84.4 84.7 85.9  MCH 29.7 29.1 29.8  MCHC 35.2 34.4 34.7  RDW 13.4 13.6 13.6  PLT 248 261 281   Thyroid  Recent Labs  Lab 11/28/21 0244  TSH 3.416    BNPNo results for input(s): "BNP", "PROBNP" in the last 168 hours.  DDimer No results for input(s): "DDIMER" in the last 168 hours.   Radiology    No results found.  Cardiac Studies   Cath: 11/29/21     Prox RCA (distal edge of stent) is 90% stenosed. Prox RCA to Dist RCA lesion is 100% stenosed.   Dist LM to Ost LAD lesion is 55% stenosed.   Prox LAD to Mid LAD stent is 5% stenosed. 1st Sept lesion is 95% stenosed.   Mid LAD to Dist LAD stent is 5% stenosed.   Dist LAD-1 lesion is 80% stenosed. Dist LAD-2 lesion is 95% stenosed.   Ost Cx to Prox Cx lesion is 55% stenosed. Prox Cx lesion is 70% stenosed. 1st Mrg lesion is 70% stenosed.   Ostial LPAV lesion is 99% stenosed.  Heavily calcified focal lesion.   Balloon angioplasty was attempted using a BALLN EMERGE MR 2.0X8.  Unable to cross with wire.  Therefore PTC aborted.  Post intervention, there is a 99% residual stenosis.   There is mild to moderate left ventricular systolic dysfunction. The left ventricular ejection fraction is 35-45% by visual estimate.   LV end diastolic pressure is mildly elevated. - 15 mmHg   There is no aortic valve stenosis.    POST-OPERATIVE DIAGNOSIS:   Severe Multivessel CAD: Proximal RCA stent patent until the distal proximal stent is 100% stenosed with bridging collaterals filling marginal branches and faint left-to-right collaterals.  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  The superior OM1 branch is smaller with an ostial 60 to 70% stenosis.  The inferior branch is larger with proximal 20% stenosis. Ostial LAD roughly 50% followed by an ectatic segment just prior to SP1 which has eccentric 90% stenosis.  Just after SP1 there is a patent stent with trivial ISR.  There is mild diffuse disease leading up to the more distal stent that is patent but with distal edge 80% lesion followed by diffuse disease and a 95% focal lesion at the apex before the vessel wraps around the apex perfusing the distal third of the PDA territory. Poor imaging on LV gram, however there appears to be mid to apical anterior hypokinesis as well as inferior hypokinesis.  EF estimated 45%.   PLAN OF CARE:  Return to nursing for ongoing care.  Restart IV heparin after TR band removal.  Continue to titrate antianginal medications.  We will discuss with interventional colleagues to determine if other options that are available patient has been transferred to Team C.  Would proceed with 2D echo.. I have added low-dose amlodipine given his hypertension and need for antianginal therapy.  Apparently there was an issue with beta-blockers.     Glenetta Hew, MD   Diagnostic Dominance: Right  Intervention     Echo: 11/28/21  IMPRESSIONS     1. Left ventricular ejection fraction, by  estimation, is 60 to 65%. The  left ventricle has normal function. The left ventricle has no regional  wall motion abnormalities. There is severe concentric left ventricular  hypertrophy. Left ventricular diastolic   parameters are consistent with Grade I diastolic dysfunction (impaired  relaxation).   2. Right ventricular systolic function is normal. The right  ventricular  size is normal. Tricuspid regurgitation signal is inadequate for assessing  PA pressure.   3. Left atrial size was mildly dilated.   4. The mitral valve is normal in structure. Trivial mitral valve  regurgitation. No evidence of mitral stenosis.   5. The aortic valve is tricuspid. Aortic valve regurgitation is not  visualized. No aortic stenosis is present.   6. Aortic dilatation noted. There is mild dilatation of the aortic root,  measuring 40 mm.   Comparison(s): No significant change from prior study.   FINDINGS   Left Ventricle: Left ventricular ejection fraction, by estimation, is 60  to 65%. The left ventricle has normal function. The left ventricle has no  regional wall motion abnormalities. Definity contrast agent was given IV  to delineate the left ventricular   endocardial borders. The left ventricular internal cavity size was normal  in size. There is severe concentric left ventricular hypertrophy. Left  ventricular diastolic parameters are consistent with Grade I diastolic  dysfunction (impaired relaxation).   Right Ventricle: The right ventricular size is normal. No increase in  right ventricular wall thickness. Right ventricular systolic function is  normal. Tricuspid regurgitation signal is inadequate for assessing PA  pressure.   Left Atrium: Left atrial size was mildly dilated.   Right Atrium: Right atrial size was normal in size.   Pericardium: Trivial pericardial effusion is present. Presence of  epicardial fat layer.   Mitral Valve: The mitral valve is normal in structure. Trivial  mitral  valve regurgitation. No evidence of mitral valve stenosis.   Tricuspid Valve: The tricuspid valve is normal in structure. Tricuspid  valve regurgitation is not demonstrated. No evidence of tricuspid  stenosis.   Aortic Valve: The aortic valve is tricuspid. Aortic valve regurgitation is  not visualized. No aortic stenosis is present.   Pulmonic Valve: The pulmonic valve was not well visualized. Pulmonic valve  regurgitation is trivial. No evidence of pulmonic stenosis.   Aorta: Aortic dilatation noted. There is mild dilatation of the aortic  root, measuring 40 mm.   IAS/Shunts: No atrial level shunt detected by color flow Doppler.    Patient Profile     71 y.o. male  with history of coronary artery disease status post LAD and RCA PCI, hyperlipidemia, OSA not on CPAP presented to the hospital with chest pain.    Assessment & Plan    NSTEMI -- presented with chest pain, hsTn peaked at 4207. Underwent cardiac cath noted above with difficulty crossing Lcx lesion, significant 3v disease. Cath films were reviewed by MD with recommendations for TCTS consult. Echo showed LVEF of 60-65%, severe LVH, g1dd. Seen by Dr. Roxan Hockey, note pending, with plans for CABG 12/1 -- received Brilinta post cath, last dose was 11/26 -- continue IV heparin, ASA, amlodipine, Imdur, avapro   HTN -- overall controlled -- continue amlodipine, avapro, Imdur   HLD -- on Zetia, statin intolerant  Hypothyroidism -- continue armour thyroid 57m daily   For questions or updates, please contact CCattle CreekPlease consult [email or web address] for contact info under        Signed, LReino Bellis NP  12/03/2021, 8:20 AM     Agree with note by LReino BellisNP-C  Mr. SRhae Lernerwas admitted with a non-STEMI.  He underwent failed intervention by Dr. HEllyn Hacklate last week of an AV groove circumflex high-grade stenosis.  In addition, he has a total RCA which has been stented in the past,  high-grade calcified ostial circumflex disease, circumflex obtuse  marginal disease and moderately severe ostial LAD disease.  He has 2 patent stents in his mid LAD and apical LAD disease as well.  He said no recurrent chest pain.  I thought the best option for revascularization was surgical.  He was seen by Dr. Roxan Hockey this morning apparently who has posted him for CABG on Friday.  He remains on IV heparin.  Lorretta Harp, M.D., [place], [facility], [place] [address] [place], [place]  [id number]  [phone] 12/03/2021 10:52 AM

## 2021-12-03 NOTE — Progress Notes (Signed)
Pre-CABG and lower extremity vein map study completed.   Please see CV Proc for preliminary results.   Darlin Coco, RDMS, RVT

## 2021-12-03 NOTE — Progress Notes (Signed)
12/03/21 1605  Clinical Encounter Type  Visited With Patient and family together;Health care provider  Visit Type Initial  Referral From Nurse Cicero Duck, RN)  Consult/Referral To Chaplain Melvenia Beam)  Recommendations Advance Directive Education  Spiritual Encounters  Spiritual Needs Emotional;Grief support  Advance Directives (For Healthcare)  Does Patient Have a Medical Advance Directive? No  Would patient like information on creating a medical advance directive? Yes (Inpatient - patient defers creating a medical advance directive at this time - Information given)  [place] Directives  Does Patient Have a Mental Health Advance Directive? No  Would patient like information on creating a mental health advance directive? Yes (Inpatient - patient defers creating a mental health advance directive at this time - Information given)   Chaplain responded to spiritual care page request for Advance Directive education with [name]. Keith Murillo. Met with [name] and his wife Keith Murillo at patient's bedside on 12/03/2021 at 1610. Mr. and [name] welcomed this extremely emotional and difficult conversation, and shared their concerns and past family experiences with death and dying.  [name] Stated that he does NOT have a court appointed [place].  [name] stated that he IS Married.  [name] stated that at he desires to name his WIFE: Keith Murillo [phone] to serve as his [place].    Chaplain provided the Advance Directive packet as well as education on Advance Directives-documents an individual completes to communicate their health care directions in advance of a time when they may need them. Chaplain informed [name] the documents which may be completed here in the hospital are the Living Will and [place].   Chaplain informed patient that the  [place] is a legal document in which an individual names another person, as their [place], to communicate his health care decisions, when he, himself is not able to make them for himself. The Health Care Agent's function can be temporary or permanent depending on his ability to make and communicate those decisions independently.   Chaplain informed [name] that in the absence of a [place], the state of [place] directs health care providers to look to the following individuals in the order listed: legal guardian; an attorney-in-fact under a general power of attorney (POA) if that POA includes the right to make health care decisions; person's spouse; a 62 of his children; a 46 of adult brothers and sisters; or an individual who has an established relationship with you, who is acting in good faith and who can convey your wishes.  If none of these persons are available or willing to make medical decisions on a patient's behalf, the law allows the patient's doctor to make decisions for them as long as another doctor agrees with those decisions.  Chaplain also informed the patient that the Health Care agent has no decision-making authority over any affairs other than those related to his medical care.   The chaplain further educated [name] that a Living Will is a legal document that allows his desires not to receive life-prolonging measures in the event that they have a condition that is incurable and will result in his death in a short period of time; they are unconscious, and doctors are confident that they will not regain consciousness; and/or they have advanced dementia or other substantial and irreversible loss of mental  function.   The chaplain informed [name] that life-prolonging measures are medical treatments that would only serve to postpone death, including breathing machines, kidney dialysis, antibiotics, artificial  nutrition and hydration (tube feeding), and similar forms of treatment and that if an individual is able to express their wishes, they may also make them known without the use of a Living Will, but in the event that he is not able to express his wishes, a Living Will allows medical providers and his family and friends to ensure that they are not making decisions on his behalf, but rather serving as his voice to convey the decisions that he has already made.   [name] is aware that the decision to create an advance directive is his alone and he may choose not to complete the documents or may choose to complete one portion or both.  [name] was informed that he can revoke the documents at any time by striking through them and writing void or by completing new documents, but that it is also advisable that the individual verbally notify interested parties that his wishes have changed.   [name] is also aware that the document must be signed in the presence of a notary public and two witnesses and that this can be done while the patient is still admitted to the hospital or after discharge in the community. If they decide to complete Advance Directives after being discharged from the hospital, they have been advised to notify all interested parties and to provide those documents to their physicians and loved ones in addition to bringing them to the hospital in the event of another hospitalization.   The chaplain informed the [name] that if he desires to proceed with completing Advance Directive Documentation while he is still admitted, notary services are sometimes available at [facility] between the hours of 1:00 and 3:30 Monday-Thursday. [name] did not have any additional questions and stated that he, and his son would complete the documents.   When the patient is ready to have these documents completed, the patient should request that their nurse place a spiritual care  consult and indicate that the patient is ready to have their advance directives notarized so that arrangements for witnesses and notary public can be made.   Please page spiritual care if the patient desires further education or has questions.   [address] [place], M. Min., [phone].

## 2021-12-03 NOTE — Progress Notes (Signed)
ANTICOAGULATION CONSULT NOTE- Follow Up  Pharmacy Consult for heparin Indication: chest pain/ACS  Allergies  Allergen Reactions   Ace Inhibitors     cough   Beta Adrenergic Blockers     Fatigue   Codeine Other (See Comments)    Other reaction(s): Unknown   Lisinopril Cough   Statins     myalgias    Patient Measurements: Height: '5\' 8"'$  (172.7 cm) Weight: 100.9 kg (222 lb 8 oz) IBW/kg (Calculated) : 68.4 Heparin Dosing Weight: 87.8 kg   Vital Signs: Temp: 98.1 F (36.7 C) (11/28 0556) Temp Source: Oral (11/28 0556) BP: 127/81 (11/28 0556) Pulse Rate: 73 (11/28 0556)  Labs: Recent Labs    11/30/21 1228 11/30/21 2041 12/01/21 0156 12/02/21 0236 12/03/21 0245  HGB  --    < > 16.5 16.6 16.3  HCT  --   --  46.9 48.3 47.0  PLT  --   --  248 261 281  HEPARINUNFRC 0.30   < > 0.41 0.41 0.63  CREATININE 1.00  --   --  1.04 1.08   < > = values in this interval not displayed.     Estimated Creatinine Clearance: 73.3 mL/min (by C-G formula based on SCr of 1.08 mg/dL).   Medical History: Past Medical History:  Diagnosis Date   Cancer [facility])    prostate   Chest pain    Complication of anesthesia    fighting with waking up   Coronary artery disease    Heart attack [facility])    Hypercholesterolemia    Hyperlipidemia    Hypertension    Ischemic heart disease September 2004   Known--with non-Q-wave myocardial infarction    Obesity    Sleep apnea    Assessment: 71 year old male presenting with chest pain. Has history of NSTEMI (2004) s/p stents to RCA (2004) and LAD (2009). No history of anticoagulation PTA. Pharmacy has been consulted to dose heparin.  He is now s/p cath with failed attempt at PCI. Heparin level continues to be at goal (0.6) on 1850 units/hr but now at upper end of goal. No s/sx of bleeding or infusion issues per RN. CBC stable.   Goal of Therapy:  Heparin level 0.3-0.7 units/ml Monitor platelets by anticoagulation protocol: Yes   Plan: Reduce  heparin to 1750 units/hr  Will follow up anticoagulation plan Monitor CBC and heparin level daily Monitor for signs/symptoms of bleeding  Erin Hearing PharmD., BCPS Clinical Pharmacist 12/03/2021 7:45 AM

## 2021-12-03 NOTE — Consult Note (Signed)
Reason for Consult: Three-vessel coronary disease Referring Physician: Dr. Owens Shark is an 71 y.o. male.  HPI: Presented with chest pain.  Keith Murillo is a 71 year old man with a past medical history significant for CAD, MI, stents to LAD and RCA (2004 in 2009), hypertension, hyperlipidemia, hypothyroidism, obesity, sleep apnea, prostate cancer.  He presented last week with sudden onset of left-sided chest tightness radiating to his left arm.  He had an episode the evening before admission and took a baby aspirin.  His pain resolved.  Then he had another episode the following day which led to him seeking medical care.  The symptoms were similar to the symptoms he had prior to his previous stents.  In retrospect he been having milder episodes of this over the past 6 months or so.  His troponin was elevated initially at 397 and peaked at 4207.  He was admitted and started on intravenous heparin.  He underwent cardiac catheterization on 11/29/2021 which showed severe three-vessel disease.  An attempt was made to angioplasty a large bifurcating OM branch, but was unsuccessful.    He has been pain-free since admission.  He was on Brilinta and received his last dose on the evening of Sunday, 12/01/2021.  Past Medical History:  Diagnosis Date   Cancer [facility])    prostate   Chest pain    Complication of anesthesia    fighting with waking up   Coronary artery disease    Heart attack [facility])    Hypercholesterolemia    Hyperlipidemia    Hypertension    Ischemic heart disease September 2004   Known--with non-Q-wave myocardial infarction    Obesity    Sleep apnea     Past Surgical History:  Procedure Laterality Date   CARDIAC CATHETERIZATION  11/2007   INGUINAL HERNIA REPAIR Right 09/22/2019   Procedure: RIGHT INGUINAL HERNIA WITH MESH;  Surgeon: Erroll Luna, MD;  Location: [place];  Service: General;  Laterality: Right;  TAP BLOCK   LEFT HEART CATH AND CORONARY  ANGIOGRAPHY N/A 11/29/2021   Procedure: LEFT HEART CATH AND CORONARY ANGIOGRAPHY;  Surgeon: Leonie Man, MD;  Location: [place] [facility];  Service: Cardiovascular;  Laterality: N/A;   TEE WITHOUT CARDIOVERSION N/A 03/16/2018   Procedure: TRANSESOPHAGEAL ECHOCARDIOGRAM (TEE);  Surgeon: Acie Fredrickson Wonda Cheng, MD;  Location: [facility] ENDOSCOPY;  Service: Cardiovascular;  Laterality: N/A;    Family History  Problem Relation Age of Onset   Cancer Mother    Hearing loss Father    Cancer Maternal Grandmother    Parkinson's disease Maternal Grandfather    Stroke Maternal Grandfather    Stroke Paternal Grandmother    Heart attack Paternal Grandfather     Social History:  reports that he has never smoked. He has never used smokeless tobacco. He reports current alcohol use. He reports that he does not use drugs.  Allergies:  Allergies  Allergen Reactions   Ace Inhibitors     cough   Beta Adrenergic Blockers     Fatigue   Codeine Other (See Comments)    Other reaction(s): Unknown   Lisinopril Cough   Statins     myalgias    Medications: Scheduled:  amLODipine  10 mg Oral Daily   aspirin EC  81 mg Oral Daily   cyanocobalamin  5,000 mcg Oral Daily   ezetimibe  10 mg Oral Daily   fluticasone  1 spray Each Nare Daily   irbesartan  300 mg Oral Daily  isosorbide mononitrate  60 mg Oral Daily   melatonin  5 mg Oral QHS   sodium chloride flush  3 mL Intravenous Q12H   thyroid  60 mg Oral q morning    Results for orders placed or performed during the hospital encounter of 11/27/21 (from the past 48 hour(s))  CBC     Status: Abnormal   Collection Time: 12/02/21  2:36 AM  Result Value Ref Range   WBC 10.9 (H) 4.0 - 10.5 K/uL   RBC 5.70 4.22 - 5.81 MIL/uL   Hemoglobin 16.6 13.0 - 17.0 g/dL   HCT 48.3 39.0 - 52.0 %   MCV 84.7 80.0 - 100.0 fL   MCH 29.1 26.0 - 34.0 pg   MCHC 34.4 30.0 - 36.0 g/dL   RDW 13.6 11.5 - 15.5 %   Platelets 261 150 - 400 K/uL   nRBC 0.0 0.0 - 0.2 %    Comment:  Performed at [facility], [place] [address] [place], [place] [address]  Heparin level (unfractionated)     Status: None   Collection Time: 12/02/21  2:36 AM  Result Value Ref Range   Heparin Unfractionated 0.41 0.30 - 0.70 IU/mL    Comment: (NOTE) The clinical reportable range upper limit is being lowered to >1.10 to align with the FDA approved guidance for the current laboratory assay.  If heparin results are below expected values, and patient dosage has  been confirmed, suggest follow up testing of antithrombin III levels. Performed at [facility], [place] [address] [place], [place] [address]   Basic metabolic panel     Status: Abnormal   Collection Time: 12/02/21  2:36 AM  Result Value Ref Range   Sodium 138 135 - 145 mmol/L   Potassium 4.1 3.5 - 5.1 mmol/L   Chloride 106 98 - 111 mmol/L   CO2 23 22 - 32 mmol/L   Glucose, Bld 116 (H) 70 - 99 mg/dL    Comment: Glucose reference range applies only to samples taken after fasting for at least 8 hours.   BUN 15 8 - 23 mg/dL   Creatinine, Ser 1.04 0.61 - 1.24 mg/dL   Calcium 8.9 8.9 - 10.3 mg/dL   GFR, Estimated >60 >60 mL/min    Comment: (NOTE) Calculated using the CKD-EPI Creatinine Equation (2021)    Anion gap 9 5 - 15    Comment: Performed at [facility] [address] [place] [address]  CBC     Status: None   Collection Time: 12/03/21  2:45 AM  Result Value Ref Range   WBC 10.1 4.0 - 10.5 K/uL   RBC 5.47 4.22 - 5.81 MIL/uL   Hemoglobin 16.3 13.0 - 17.0 g/dL   HCT 47.0 39.0 - 52.0 %   MCV 85.9 80.0 - 100.0 fL   MCH 29.8 26.0 - 34.0 pg   MCHC 34.7 30.0 - 36.0 g/dL   RDW 13.6 11.5 - 15.5 %   Platelets 281 150 - 400 K/uL   nRBC 0.0 0.0 - 0.2 %    Comment: Performed at [facility], [place] [address], [place], [place] [address]  Heparin level (unfractionated)     Status: None   Collection Time: 12/03/21  2:45 AM  Result Value Ref Range   Heparin Unfractionated 0.63 0.30 - 0.70 IU/mL     Comment: (NOTE) The clinical reportable range upper limit is being lowered to >1.10 to align with the FDA approved guidance for  the current laboratory assay.  If heparin results are below expected values, and patient dosage has  been confirmed, suggest follow up testing of antithrombin III levels. Performed at [facility], [place] [address], [place], [place] [address]   Basic metabolic panel     Status: Abnormal   Collection Time: 12/03/21  2:45 AM  Result Value Ref Range   Sodium 135 135 - 145 mmol/L   Potassium 4.1 3.5 - 5.1 mmol/L   Chloride 102 98 - 111 mmol/L   CO2 22 22 - 32 mmol/L   Glucose, Bld 116 (H) 70 - 99 mg/dL    Comment: Glucose reference range applies only to samples taken after fasting for at least 8 hours.   BUN 17 8 - 23 mg/dL   Creatinine, Ser 1.08 0.61 - 1.24 mg/dL   Calcium 8.9 8.9 - 10.3 mg/dL   GFR, Estimated >60 >60 mL/min    Comment: (NOTE) Calculated using the CKD-EPI Creatinine Equation (2021)    Anion gap 11 5 - 15    Comment: Performed at [facility] [address], [place], [place] [address]   Cardiac catheterization 11/29/2021   Prox RCA (distal edge of stent) is 90% stenosed. Prox RCA to Dist RCA lesion is 100% stenosed.   Dist LM to Ost LAD lesion is 55% stenosed.   Prox LAD to Mid LAD stent is 5% stenosed. 1st Sept lesion is 95% stenosed.   Mid LAD to Dist LAD stent is 5% stenosed.   Dist LAD-1 lesion is 80% stenosed. Dist LAD-2 lesion is 95% stenosed.   Ost Cx to Prox Cx lesion is 55% stenosed. Prox Cx lesion is 70% stenosed. 1st Mrg lesion is 70% stenosed.   Ostial LPAV lesion is 99% stenosed.  Heavily calcified focal lesion.   Balloon angioplasty was attempted using a BALLN EMERGE MR 2.0X8.  Unable to cross with wire.  Therefore PTC aborted.  Post intervention, there is a 99% residual stenosis.   There is mild to moderate left ventricular systolic dysfunction. The left ventricular ejection fraction is 35-45% by visual estimate.    LV end diastolic pressure is mildly elevated. - 15 mmHg   There is no aortic valve stenosis.  I personally reviewed the cardiac catheterization images.  There is severe three-vessel disease.  EF 35 to 40%.  Review of Systems Blood pressure 119/71, pulse 74, temperature 98 F (36.7 C), temperature source Oral, resp. rate 18, height _0  (1.727 m), weight 100.9 kg, SpO2 97 %. Physical Exam Constitutional:      General: He is not in acute distress.    Appearance: Normal appearance.  HENT:     Head: Normocephalic and atraumatic.  Eyes:     General: No scleral icterus.    Extraocular Movements: Extraocular movements intact.  Neck:     Vascular: No carotid bruit.  Cardiovascular:     Rate and Rhythm: Normal rate and regular rhythm.     Pulses: Normal pulses.     Heart sounds: Normal heart sounds. No murmur heard.    No friction rub. No gallop.  Pulmonary:     Effort: Pulmonary effort is normal.     Breath sounds: Normal breath sounds.  Abdominal:     General: There is no distension.     Palpations: Abdomen is soft.  Musculoskeletal:     Cervical back: Neck supple.  Lymphadenopathy:     Cervical: No cervical adenopathy.  Skin:    General: Skin is warm  and dry.     Comments: Varicose veins both calves  Neurological:     General: No focal deficit present.     Mental Status: He is alert and oriented to person, place, and time.     Cranial Nerves: No cranial nerve deficit.     Motor: No weakness.     Assessment/Plan: Keith Murillo is a 71 year old man with a past medical history significant for CAD, MI, stents to LAD and RCA (2004 in 2009), hypertension, hyperlipidemia, hypothyroidism, obesity, sleep apnea, prostate cancer.  He presented with unstable chest pain and ruled in for a non-ST elevation MI.  The catheterization he was found to have severe three-vessel coronary disease.  Three-vessel CAD-has complex disease and multiple previous percutaneous interventions.   His right coronary really has no evidence of a graftable target.  He has a large OM that bifurcates but potentially as a site for 2 grafts, likely the LIMA.  The AV groove circumflex is not graftable.  His LAD has some moderate disease proximally and then very severe disease distally at the apex.  In between the vessel has extensive stenting.  I suspect that we could get a graft in there but likely would use a vein graft due to the lack of a tight proximal stenosis.  I discussed the potential option of coronary bypass grafting with Keith Murillo.  He understands that his anatomy is not the most favorable.  He would be at higher than normal risk for early or late graft occlusion.  However is far more likely to provide symptom relief than medical therapy.  I informed him of the general nature of the procedure including the need for general anesthesia, the incisions to be used, the use of cardiopulmonary bypass, the use of drainage tubes and pacing wires postoperatively, the expected hospital stay, and the overall recovery.  I informed him of the indications, risks, benefits, and alternatives.  He understands the risks include, but not limited to death, MI, DVT, PE, bleeding, possible need for transfusion, infection, cardiac arrhythmias, respiratory renal failure, as well as possibility of other unforeseeable complications.  He accepts the risk and wishes to proceed with surgery.  He was on Brilinta until Sunday evening.  Options are to wait until Monday for a full 7-day washout or proceed on Friday, which would be 4 days off Brilinta and likely would have enough platelet function to avoid excessive risk of bleeding.  He does understand that his risk of bleeding would be a little higher on Friday than Monday but he wishes to proceed to soon as possible.  Keith Murillo 12/03/2021, 5:52 PM

## 2021-12-03 NOTE — Progress Notes (Deleted)
Pt refused cpap

## 2021-12-03 NOTE — Progress Notes (Signed)
   12/03/21 1500  Clinical Encounter Type  Visited With Patient not available;Health care provider  Visit Type Initial  Referral From Nurse Cicero Duck, RN)  Consult/Referral To Chaplain Melvenia Beam)  Recommendations Patient Not Available   Chaplain Attempted Spiritual Care Consultation for Advance Directive - Per Patient Nurse : Alda Lea; Patient gone for Ultrasound. Chaplain will attempt visit later. [address] [place], [place] [phone]

## 2021-12-04 DIAGNOSIS — I214 Non-ST elevation (NSTEMI) myocardial infarction: Secondary | ICD-10-CM | POA: Diagnosis not present

## 2021-12-04 LAB — BASIC METABOLIC PANEL
Anion gap: 7 (ref 5–15)
BUN: 14 mg/dL (ref 8–23)
CO2: 22 mmol/L (ref 22–32)
Calcium: 8.8 mg/dL — ABNORMAL LOW (ref 8.9–10.3)
Chloride: 103 mmol/L (ref 98–111)
Creatinine, Ser: 1.03 mg/dL (ref 0.61–1.24)
GFR, Estimated: >60 mL/min (ref >60)
Glucose, Bld: 115 mg/dL — ABNORMAL HIGH (ref 70–99)
Potassium: 4.1 mmol/L (ref 3.5–5.1)
Sodium: 132 mmol/L — ABNORMAL LOW (ref 135–145)

## 2021-12-04 LAB — URINALYSIS, ROUTINE W REFLEX MICROSCOPIC
Bilirubin Urine: NEGATIVE
Glucose, UA: NEGATIVE mg/dL
Hgb urine dipstick: NEGATIVE
Ketones, ur: NEGATIVE mg/dL
Leukocytes,Ua: NEGATIVE
Nitrite: NEGATIVE
Protein, ur: NEGATIVE mg/dL
Specific Gravity, Urine: 1.011 (ref 1.005–1.030)
pH: 5 (ref 5.0–8.0)

## 2021-12-04 LAB — CBC
HCT: 51 % (ref 39.0–52.0)
Hemoglobin: 17.4 g/dL — ABNORMAL HIGH (ref 13.0–17.0)
MCH: 29.3 pg (ref 26.0–34.0)
MCHC: 34.1 g/dL (ref 30.0–36.0)
MCV: 85.9 fL (ref 80.0–100.0)
Platelets: 291 10*3/uL (ref 150–400)
RBC: 5.94 MIL/uL — ABNORMAL HIGH (ref 4.22–5.81)
RDW: 13.6 % (ref 11.5–15.5)
WBC: 9 10*3/uL (ref 4.0–10.5)
nRBC: 0 % (ref 0.0–0.2)

## 2021-12-04 LAB — SURGICAL PCR SCREEN
MRSA, PCR: NEGATIVE
Staphylococcus aureus: NEGATIVE

## 2021-12-04 LAB — HEPARIN LEVEL (UNFRACTIONATED): Heparin Unfractionated: 0.6 IU/mL (ref 0.30–0.70)

## 2021-12-04 MED ORDER — SENNOSIDES-DOCUSATE SODIUM 8.6-50 MG PO TABS
1.0000 | ORAL_TABLET | Freq: Two times a day (BID) | ORAL | Status: DC
  Administered 2021-12-04 – 2021-12-05 (×4): 1 via ORAL
  Filled 2021-12-04 (×4): qty 1

## 2021-12-04 NOTE — Care Management Important Message (Signed)
Important Message  Patient Details  Name: Keith Murillo MRN: [id number] Date of Birth: 05/19/1950   Medicare Important Message Given:  Yes     Shelda Altes 12/04/2021, 10:35 AM

## 2021-12-04 NOTE — Progress Notes (Addendum)
Rounding Note    Patient Name: AARIN BLUETT Date of Encounter: 12/04/2021  [place] Cardiologist: Ida Rogue, MD   Subjective   No complaints this morning. No chest pain. Pending CABG  Inpatient Medications    Scheduled Meds:  amLODipine  10 mg Oral Daily   aspirin EC  81 mg Oral Daily   cyanocobalamin  5,000 mcg Oral Daily   ezetimibe  10 mg Oral Daily   fluticasone  1 spray Each Nare Daily   irbesartan  300 mg Oral Daily   isosorbide mononitrate  60 mg Oral Daily   melatonin  5 mg Oral QHS   sodium chloride flush  3 mL Intravenous Q12H   thyroid  60 mg Oral q morning   Continuous Infusions:  sodium chloride     heparin 1,750 Units/hr (12/03/21 2217)   PRN Meds: sodium chloride, acetaminophen, nitroGLYCERIN, ondansetron (ZOFRAN) IV, sodium chloride flush   Vital Signs    Vitals:   12/03/21 0836 12/03/21 1500 12/03/21 2015 12/04/21 0544  BP: (!) 142/76 119/71 123/78 124/77  Pulse:  74 76 74  Resp:  _0 Temp:  98 F (36.7 C) 97.7 F (36.5 C) 97.7 F (36.5 C)  TempSrc:  Oral Oral Oral  SpO2:  97% 97%   Weight:      Height:        Intake/Output Summary (Last 24 hours) at 12/04/2021 0855 Last data filed at 12/04/2021 0500 Gross per 24 hour  Intake 658.66 ml  Output 350 ml  Net 308.66 ml      11/27/2021   11:38 PM 11/27/2021    6:05 PM 08/03/2021    2:03 PM  Last 3 Weights  Weight (lbs) 222 lb 8 oz 220 lb 219 lb 9.3 oz  Weight (kg) 100.925 kg 99.791 kg 99.6 kg      Telemetry    Sinus Rhythm - Personally Reviewed  ECG    No new tracing  Physical Exam   GEN: No acute distress.   Neck: No JVD Cardiac: RRR, no murmurs, rubs, or gallops.  Respiratory: Clear to auscultation bilaterally. GI: Soft, nontender, non-distended  MS: No edema; No deformity. Neuro:  Nonfocal  Psych: Normal affect   Labs    High Sensitivity Troponin:   Recent Labs  Lab 11/27/21 1910 11/27/21 2135 11/27/21 2359 11/28/21 0943  11/28/21 1108  TROPONINIHS 610* 696* 741* 3,797* 4,207*     Chemistry Recent Labs  Lab 12/02/21 0236 12/03/21 0245 12/04/21 0348  NA 138 135 132*  K 4.1 4.1 4.1  CL 106 102 103  CO2 _1 GLUCOSE 116* 116* 115*  BUN _2 CREATININE 1.04 1.08 1.03  CALCIUM 8.9 8.9 8.8*  GFRNONAA >60 >60 >60  ANIONGAP _3 Lipids  Recent Labs  Lab 11/28/21 0244  CHOL 179  TRIG 322*  HDL 33*  LDLCALC 82  CHOLHDL 5.4    Hematology Recent Labs  Lab 12/02/21 0236 12/03/21 0245 12/04/21 0348  WBC 10.9* 10.1 9.0  RBC 5.70 5.47 5.94*  HGB 16.6 16.3 17.4*  HCT 48.3 47.0 51.0  MCV 84.7 85.9 85.9  MCH 29.1 29.8 29.3  MCHC 34.4 34.7 34.1  RDW 13.6 13.6 13.6  PLT 261 281 291   Thyroid  Recent Labs  Lab 11/28/21 0244  TSH 3.416    BNPNo results for input(s): "BNP", "PROBNP" in the last 168 hours.  DDimer No results for input(s): "DDIMER"  in the last 168 hours.   Radiology    VAS US DOPPLER PRE CABG  Result Date: 12/03/2021 PREOPERATIVE VASCULAR EVALUATION Patient Name:  KEAGEN HEINLEN  Date of Exam:   12/03/2021 Medical Rec #: [id number]           Accession #:    [id number] Date of Birth: [birth year]           Patient Gender: M Patient Age:   71 years Exam Location[facility]  [facility] Procedure:      VAS US DOPPLER PRE CABG Referring Phys: Remo Lipps HENDRICKSON --------------------------------------------------------------------------------  Indications:  Pre-CABG. Risk Factors: Hypertension, hyperlipidemia, no history of smoking, prior MI,               coronary artery disease. Performing Technologist: Darlin Coco RDMS RVT  Examination Guidelines: A complete evaluation includes B-mode imaging, spectral Doppler, color Doppler, and power Doppler as needed of all accessible portions of each vessel. Bilateral testing is considered an integral part of a complete examination. Limited examinations for reoccurring indications may be performed as noted.  Right Carotid Findings:  +----------+--------+--------+--------+-----------+------------------+           PSV cm/sEDV cm/sStenosisDescribe   Comments           +----------+--------+--------+--------+-----------+------------------+ CCA Prox  77      14                                            +----------+--------+--------+--------+-----------+------------------+ CCA Distal77      16                         intimal thickening +----------+--------+--------+--------+-----------+------------------+ ICA Prox  76      16      1-39%   hyperechoic                   +----------+--------+--------+--------+-----------+------------------+ ICA Mid   53      10                                            +----------+--------+--------+--------+-----------+------------------+ ICA Distal38      13                                            +----------+--------+--------+--------+-----------+------------------+ ECA       112                                                   +----------+--------+--------+--------+-----------+------------------+ +----------+--------+-------+----------------+------------+           PSV cm/sEDV cmsDescribe        Arm Pressure +----------+--------+-------+----------------+------------+ [id number]             Multiphasic, WNL             +----------+--------+-------+----------------+------------+ +---------+--------+--+--------+-+---------+ VertebralPSV cm/s49EDV cm/s8Antegrade +---------+--------+--+--------+-+---------+ Left Carotid Findings: +----------+--------+--------+--------+------------+--------+           PSV cm/sEDV cm/sStenosisDescribe    Comments +----------+--------+--------+--------+------------+--------+ CCA Prox  113     15                                   +----------+--------+--------+--------+------------+--------+  CCA Distal91      20                                    +----------+--------+--------+--------+------------+--------+ ICA Prox  79      19              heterogenous         +----------+--------+--------+--------+------------+--------+ ICA Mid   68      20                                   +----------+--------+--------+--------+------------+--------+ ICA Distal50      12                                   +----------+--------+--------+--------+------------+--------+ ECA       114     11                                   +----------+--------+--------+--------+------------+--------+  +----------+--------+--------+----------------+------------+ SubclavianPSV cm/sEDV cm/sDescribe        Arm Pressure +----------+--------+--------+----------------+------------+           143             Multiphasic, WNL             +----------+--------+--------+----------------+------------+ +---------+--------+--+--------+--+---------+ VertebralPSV cm/s57EDV cm/s16Antegrade +---------+--------+--+--------+--+---------+  ABI Findings: +--------+------------------+-----+---------+--------+ Right   Rt Pressure (mmHg)IndexWaveform Comment  +--------+------------------+-----+---------+--------+ Brachial                       triphasic         +--------+------------------+-----+---------+--------+ PTA                            triphasic         +--------+------------------+-----+---------+--------+ DP                             triphasic         +--------+------------------+-----+---------+--------+ +--------+------------------+-----+---------+-------+ Left    Lt Pressure (mmHg)IndexWaveform Comment +--------+------------------+-----+---------+-------+ Brachial                       triphasic        +--------+------------------+-----+---------+-------+ PTA                            triphasic        +--------+------------------+-----+---------+-------+ DP                             triphasic         +--------+------------------+-----+---------+-------+  Right Doppler Findings: +--------+--------+-----+---------+--------+ Site    PressureIndexDoppler  Comments +--------+--------+-----+---------+--------+ Brachial             triphasic         +--------+--------+-----+---------+--------+ Radial               triphasic         +--------+--------+-----+---------+--------+ Ulnar                triphasic         +--------+--------+-----+---------+--------+  Left Doppler Findings: +--------+--------+-----+---------+--------+ Site    PressureIndexDoppler  Comments +--------+--------+-----+---------+--------+ Brachial             triphasic         +--------+--------+-----+---------+--------+ Radial               triphasic         +--------+--------+-----+---------+--------+ Ulnar                triphasic         +--------+--------+-----+---------+--------+   Summary: Right Carotid: Velocities in the right ICA are consistent with a 1-39% stenosis. Left Carotid: Velocities in the left ICA are consistent with a 1-39% stenosis. Vertebrals:  Bilateral vertebral arteries demonstrate antegrade flow. Subclavians: Normal flow hemodynamics were seen in bilateral subclavian              arteries. Right Upper Extremity: Doppler waveforms remain within normal limits with right radial compression. Doppler waveforms remain within normal limits with right ulnar compression. Left Upper Extremity: Doppler waveforms remain within normal limits with left radial compression. Doppler waveforms remain within normal limits with left ulnar compression.  Electronically signed by Servando Snare MD on 12/03/2021 at 7:01:29 PM.    Final    VAS [place] LOWER EXTREMITY SAPHENOUS VEIN MAPPING  Result Date: 12/03/2021 LOWER EXTREMITY VEIN MAPPING Patient Name:  STEWART SASAKI  Date of Exam:   12/03/2021 Medical Rec #: [id number]           Accession #:    [id number] Date of Birth: Jan 17, 1950            Patient Gender: M Patient Age:   7 years Exam Location:  [facility] Procedure:      VAS [place] LOWER EXTREMITY SAPHENOUS VEIN MAPPING Referring Phys: Remo Lipps HENDRICKSON --------------------------------------------------------------------------------  Indications: Pre-op, CABG, varicosities LT>RT  Comparison Study: No prior studies. Performing Technologist: Darlin Coco RDMS, RVT  Examination Guidelines: A complete evaluation includes B-mode imaging, spectral Doppler, color Doppler, and power Doppler as needed of all accessible portions of each vessel. Bilateral testing is considered an integral part of a complete examination. Limited examinations for reoccurring indications may be performed as noted. +----------------+----------------+--------------------+-----------+-----------+ RT Diameter (cm)  RT Findings           GSV         LT DiameterLT Findings                                                        (cm)                +----------------+----------------+--------------------+-----------+-----------+       0.58                         Saphenofemoral      0.72                                                      Junction                             +----------------+----------------+--------------------+-----------+-----------+       0.46  Proximal thigh      0.47                +----------------+----------------+--------------------+-----------+-----------+       0.40                           Mid thigh         0.44                +----------------+----------------+--------------------+-----------+-----------+       0.44                          Distal thigh       0.40     branching  +----------------+----------------+--------------------+-----------+-----------+       0.36         branching            Knee           0.40     branching  +----------------+----------------+--------------------+-----------+-----------+       0.35                            Prox calf         0.26     branching  +----------------+----------------+--------------------+-----------+-----------+    0.20/0.39        multiple          Mid calf         0.47                                    branches                                               +----------------+----------------+--------------------+-----------+-----------+       0.39                          Distal calf        0.46                +----------------+----------------+--------------------+-----------+-----------+       0.40                             Ankle           0.46                +----------------+----------------+--------------------+-----------+-----------+ Diagnosing physician: Servando Snare MD Electronically signed by Servando Snare MD on 12/03/2021 at 7:01:22 PM.    Final     Cardiac Studies   Cath: 11/29/21     Prox RCA (distal edge of stent) is 90% stenosed. Prox RCA to Dist RCA lesion is 100% stenosed.   Dist LM to Ost LAD lesion is 55% stenosed.   Prox LAD to Mid LAD stent is 5% stenosed. 1st Sept lesion is 95% stenosed.   Mid LAD to Dist LAD stent is 5% stenosed.   Dist LAD-1 lesion is 80% stenosed. Dist LAD-2 lesion is 95% stenosed.   Ost Cx to Prox Cx lesion is 55% stenosed. Prox Cx lesion is 70% stenosed. 1st Mrg lesion is 70% stenosed.   Ostial LPAV lesion is  99% stenosed.  Heavily calcified focal lesion.   Balloon angioplasty was attempted using a BALLN EMERGE MR 2.0X8.  Unable to cross with wire.  Therefore PTC aborted.  Post intervention, there is a 99% residual stenosis.   There is mild to moderate left ventricular systolic dysfunction. The left ventricular ejection fraction is 35-45% by visual estimate.   LV end diastolic pressure is mildly elevated. - 15 mmHg   There is no aortic valve stenosis.   POST-OPERATIVE DIAGNOSIS:   Severe Multivessel CAD: Proximal RCA stent patent until the distal proximal stent is 100% stenosed with bridging  collaterals filling marginal branches and faint left-to-right collaterals.  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  The superior OM1 branch is smaller with an ostial 60 to 70% stenosis.  The inferior branch is larger with proximal 20% stenosis. Ostial LAD roughly 50% followed by an ectatic segment just prior to SP1 which has eccentric 90% stenosis.  Just after SP1 there is a patent stent with trivial ISR.  There is mild diffuse disease leading up to the more distal stent that is patent but with distal edge 80% lesion followed by diffuse disease and a 95% focal lesion at the apex before the vessel wraps around the apex perfusing the distal third of the PDA territory. Poor imaging on LV gram, however there appears to be mid to apical anterior hypokinesis as well as inferior hypokinesis.  EF estimated 45%.   PLAN OF CARE:  Return to nursing for ongoing care.  Restart IV heparin after TR band removal.  Continue to titrate antianginal medications.  We will discuss with interventional colleagues to determine if other options that are available patient has been transferred to Team C.  Would proceed with 2D echo.. I have added low-dose amlodipine given his hypertension and need for antianginal therapy.  Apparently there was an issue with beta-blockers.     Glenetta Hew, MD   Diagnostic Dominance: Right  Intervention      Echo: 11/28/21   IMPRESSIONS     1. Left ventricular ejection fraction, by estimation, is 60 to 65%. The  left ventricle has normal function. The left ventricle has no regional  wall motion abnormalities. There is  severe concentric left ventricular  hypertrophy. Left ventricular diastolic   parameters are consistent with Grade I diastolic dysfunction (impaired  relaxation).   2. Right ventricular systolic function is normal. The right ventricular  size is normal. Tricuspid regurgitation signal is inadequate for assessing  PA pressure.   3. Left atrial size was mildly dilated.   4. The mitral valve is normal in structure. Trivial mitral valve  regurgitation. No evidence of mitral stenosis.   5. The aortic valve is tricuspid. Aortic valve regurgitation is not  visualized. No aortic stenosis is present.   6. Aortic dilatation noted. There is mild dilatation of the aortic root,  measuring 40 mm.   Comparison(s): No significant change from prior study.   FINDINGS   Left Ventricle: Left ventricular ejection fraction, by estimation, is 60  to 65%. The left ventricle has normal function. The left ventricle has no  regional wall motion abnormalities. Definity contrast agent was  given IV  to delineate the left ventricular   endocardial borders. The left ventricular internal cavity size was normal  in size. There is severe concentric left ventricular hypertrophy. Left  ventricular diastolic parameters are consistent with Grade I diastolic  dysfunction (impaired relaxation).   Right Ventricle: The right ventricular size is normal. No increase in  right ventricular wall thickness. Right ventricular systolic function is  normal. Tricuspid regurgitation signal is inadequate for assessing PA  pressure.   Left Atrium: Left atrial size was mildly dilated.   Right Atrium: Right atrial size was normal in size.   Pericardium: Trivial pericardial effusion is present. Presence of  epicardial fat layer.   Mitral Valve: The mitral valve is normal in structure. Trivial mitral  valve regurgitation. No evidence of mitral valve stenosis.   Tricuspid Valve: The tricuspid valve is normal in structure. Tricuspid   valve regurgitation is not demonstrated. No evidence of tricuspid  stenosis.   Aortic Valve: The aortic valve is tricuspid. Aortic valve regurgitation is  not visualized. No aortic stenosis is present.   Pulmonic Valve: The pulmonic valve was not well visualized. Pulmonic valve  regurgitation is trivial. No evidence of pulmonic stenosis.   Aorta: Aortic dilatation noted. There is mild dilatation of the aortic  root, measuring 40 mm.   IAS/Shunts: No atrial level shunt detected by color flow Doppler.   Patient Profile     71 y.o. male with history of coronary artery disease status post LAD and RCA PCI, hyperlipidemia, OSA not on CPAP presented to the hospital with chest pain.     Assessment & Plan    NSTEMI -- presented with chest pain, hsTn peaked at 4207. Underwent cardiac cath noted above with difficulty crossing Lcx lesion, significant 3v disease. Cath films were reviewed by MD with recommendations for TCTS consult. Echo showed LVEF of 60-65%, severe LVH, g1dd. Seen by Dr. Roxan Hockey with plans for CABG 12/1 -- received Brilinta post cath, last dose was 11/26 -- continue IV heparin, ASA, amlodipine, Imdur, avapro   HTN -- overall controlled -- continue amlodipine, avapro, Imdur   HLD -- on Zetia, statin intolerant   Hypothyroidism -- continue armour thyroid 21m daily   For questions or updates, please contact CWinlockPlease consult [email or web address] for contact info under        Signed, LReino Bellis NP  12/04/2021, 8:55 AM     Agree with note by LReino BellisNP-C  Patient with three-vessel disease by cath with unsuccessful attempt at PCI of an AV groove circumflex.  He is on IV heparin.  He is completely asymptomatic.  He is scheduled for CABG Friday morning with Dr. HRoxan Hockey  JLorretta Harp M.D., FChester FBuena Vista Regional [facility] FLaverta BaltimoreFWabasha[address] [place]outhwest [place]   [phone]/29/2023 10:41 AM

## 2021-12-04 NOTE — Progress Notes (Signed)
CARDIAC REHAB PHASE I     Pt sitting in chair feeling well today. Wife is in visiting. Provided pre-[facility] education review for pt and wife. Wife was not present yesterday and had several questions. All questions and concerns addressed. Will continue to follow.   [id number] Vanessa Barbara, RN BSN 12/04/2021 2:32 PM

## 2021-12-04 NOTE — Plan of Care (Signed)
  Problem: Education: Goal: Knowledge of General Education information will improve Description: Including pain rating scale, medication(s)/side effects and non-pharmacologic comfort measures Outcome: Progressing   Problem: Health Behavior/Discharge Planning: Goal: Ability to manage health-related needs will improve Outcome: Progressing   Problem: Clinical Measurements: Goal: Ability to maintain clinical measurements within normal limits will improve Outcome: Progressing Goal: Will remain free from infection Outcome: Progressing Goal: Cardiovascular complication will be avoided Outcome: Progressing   Problem: Nutrition: Goal: Adequate nutrition will be maintained Outcome: Progressing   Problem: Safety: Goal: Ability to remain free from injury will improve Outcome: Progressing

## 2021-12-04 NOTE — Progress Notes (Signed)
ANTICOAGULATION CONSULT NOTE Pharmacy Consult for heparin Indication: chest pain/ACS  Allergies  Allergen Reactions   Ace Inhibitors     cough   Beta Adrenergic Blockers     Fatigue   Codeine Other (See Comments)    Other reaction(s): Unknown   Lisinopril Cough   Statins     myalgias    Patient Measurements: Height: '5\' 8"'$  (172.7 cm) Weight: 100.9 kg (222 lb 8 oz) IBW/kg (Calculated) : 68.4 Heparin Dosing Weight: 87.8 kg   Vital Signs: Temp: 97.7 F (36.5 C) (11/29 0544) Temp Source: Oral (11/29 0544) BP: 124/77 (11/29 0544) Pulse Rate: 74 (11/29 0544)  Labs: Recent Labs    12/02/21 0236 12/03/21 0245 12/04/21 0348  HGB 16.6 16.3 17.4*  HCT 48.3 47.0 51.0  PLT 261 281 291  HEPARINUNFRC 0.41 0.63 0.60  CREATININE 1.04 1.08 1.03     Estimated Creatinine Clearance: 76.8 mL/min (by C-G formula based on SCr of 1.03 mg/dL).   Medical History: Past Medical History:  Diagnosis Date   Cancer [facility])    prostate   Chest pain    Complication of anesthesia    fighting with waking up   Coronary artery disease    Heart attack [facility])    Hypercholesterolemia    Hyperlipidemia    Hypertension    Ischemic heart disease September 2004   Known--with non-Q-wave myocardial infarction    Obesity    Sleep apnea    Assessment: 71 year old male presenting with chest pain. Has history of NSTEMI (2004) s/p stents to RCA (2004) and LAD (2009). No history of anticoagulation PTA. Pharmacy has been consulted to dose heparin.  He is now s/p cath with failed attempt at PCI. Heparin level continues to be at goal (0.6) on 1750 units/hr. No s/sx of bleeding or infusion issues per RN. CBC stable.   Plan for surgery later this week.   Goal of Therapy:  Heparin level 0.3-0.7 units/ml Monitor platelets by anticoagulation protocol: Yes   Plan: Continue heparin at 1750 units/hr  Monitor CBC and heparin level daily Monitor for signs/symptoms of bleeding  Erin Hearing PharmD.,  BCPS Clinical Pharmacist 12/04/2021 8:02 AM

## 2021-12-05 ENCOUNTER — Inpatient Hospital Stay (HOSPITAL_COMMUNITY): Payer: Medicare Other

## 2021-12-05 ENCOUNTER — Encounter (HOSPITAL_COMMUNITY): Payer: Self-pay | Admitting: Internal Medicine

## 2021-12-05 DIAGNOSIS — I214 Non-ST elevation (NSTEMI) myocardial infarction: Secondary | ICD-10-CM | POA: Diagnosis not present

## 2021-12-05 DIAGNOSIS — I2510 Atherosclerotic heart disease of native coronary artery without angina pectoris: Secondary | ICD-10-CM

## 2021-12-05 LAB — CBC
HCT: 50.7 % (ref 39.0–52.0)
Hemoglobin: 17.3 g/dL — ABNORMAL HIGH (ref 13.0–17.0)
MCH: 29.3 pg (ref 26.0–34.0)
MCHC: 34.1 g/dL (ref 30.0–36.0)
MCV: 85.8 fL (ref 80.0–100.0)
Platelets: 275 10*3/uL (ref 150–400)
RBC: 5.91 MIL/uL — ABNORMAL HIGH (ref 4.22–5.81)
RDW: 13.5 % (ref 11.5–15.5)
WBC: 9 10*3/uL (ref 4.0–10.5)
nRBC: 0 % (ref 0.0–0.2)

## 2021-12-05 LAB — BASIC METABOLIC PANEL
Anion gap: 15 (ref 5–15)
BUN: 19 mg/dL (ref 8–23)
CO2: 20 mmol/L — ABNORMAL LOW (ref 22–32)
Calcium: 9.1 mg/dL (ref 8.9–10.3)
Chloride: 101 mmol/L (ref 98–111)
Creatinine, Ser: 1 mg/dL (ref 0.61–1.24)
GFR, Estimated: >60 mL/min (ref >60)
Glucose, Bld: 120 mg/dL — ABNORMAL HIGH (ref 70–99)
Potassium: 4.1 mmol/L (ref 3.5–5.1)
Sodium: 136 mmol/L (ref 135–145)

## 2021-12-05 LAB — BLOOD GAS, ARTERIAL
Acid-base deficit: 1.7 mmol/L (ref 0.0–2.0)
Bicarbonate: 21.9 mmol/L (ref 20.0–28.0)
O2 Saturation: 98.2 %
Patient temperature: 36.9
pCO2 arterial: 33 mmHg (ref 32–48)
pH, Arterial: 7.43 (ref 7.35–7.45)
pO2, Arterial: 87 mmHg (ref 83–108)

## 2021-12-05 LAB — TYPE AND SCREEN
ABO/RH(D): O POS
Antibody Screen: NEGATIVE

## 2021-12-05 LAB — SARS CORONAVIRUS 2 (TAT 6-24 HRS): SARS Coronavirus 2: NEGATIVE

## 2021-12-05 LAB — PROTIME-INR
INR: 1.1 (ref 0.8–1.2)
Prothrombin Time: 13.8 seconds (ref 11.4–15.2)

## 2021-12-05 LAB — APTT: aPTT: 113 seconds — ABNORMAL HIGH (ref 24–36)

## 2021-12-05 LAB — HEPARIN LEVEL (UNFRACTIONATED): Heparin Unfractionated: 0.56 IU/mL (ref 0.30–0.70)

## 2021-12-05 LAB — ABO/RH: ABO/RH(D): O POS

## 2021-12-05 MED ORDER — CHLORHEXIDINE GLUCONATE CLOTH 2 % EX PADS
6.0000 | MEDICATED_PAD | Freq: Once | CUTANEOUS | Status: AC
  Administered 2021-12-05: 6 via TOPICAL

## 2021-12-05 MED ORDER — VANCOMYCIN HCL 1500 MG/300ML IV SOLN
1500.0000 mg | INTRAVENOUS | Status: AC
  Administered 2021-12-06: 1500 mg via INTRAVENOUS
  Filled 2021-12-05: qty 300

## 2021-12-05 MED ORDER — CHLORHEXIDINE GLUCONATE CLOTH 2 % EX PADS
6.0000 | MEDICATED_PAD | Freq: Once | CUTANEOUS | Status: AC
  Administered 2021-12-06: 6 via TOPICAL

## 2021-12-05 MED ORDER — TRANEXAMIC ACID (OHS) BOLUS VIA INFUSION
15.0000 mg/kg | INTRAVENOUS | Status: AC
  Administered 2021-12-06: 1513.5 mg via INTRAVENOUS
  Filled 2021-12-05: qty 1514

## 2021-12-05 MED ORDER — PHENYLEPHRINE HCL-NACL 20-0.9 MG/250ML-% IV SOLN
30.0000 ug/min | INTRAVENOUS | Status: AC
  Administered 2021-12-06: 10 ug/min via INTRAVENOUS
  Filled 2021-12-05: qty 250

## 2021-12-05 MED ORDER — NOREPINEPHRINE 4 MG/250ML-% IV SOLN
0.0000 ug/min | INTRAVENOUS | Status: DC
  Filled 2021-12-05: qty 250

## 2021-12-05 MED ORDER — CEFAZOLIN SODIUM-DEXTROSE 2-4 GM/100ML-% IV SOLN
2.0000 g | INTRAVENOUS | Status: AC
  Administered 2021-12-06 (×2): 2 g via INTRAVENOUS
  Filled 2021-12-05: qty 100

## 2021-12-05 MED ORDER — HEPARIN 30,000 UNITS/1000 ML (OHS) CELLSAVER SOLUTION
Status: DC
  Filled 2021-12-05: qty 1000

## 2021-12-05 MED ORDER — TRAZODONE HCL 50 MG PO TABS
50.0000 mg | ORAL_TABLET | Freq: Once | ORAL | Status: AC
  Administered 2021-12-05: 50 mg via ORAL
  Filled 2021-12-05: qty 1

## 2021-12-05 MED ORDER — DEXMEDETOMIDINE HCL IN NACL 400 MCG/100ML IV SOLN
0.1000 ug/kg/h | INTRAVENOUS | Status: AC
  Administered 2021-12-06: .7 ug/kg/h via INTRAVENOUS
  Filled 2021-12-05: qty 100

## 2021-12-05 MED ORDER — DIAZEPAM 5 MG PO TABS
5.0000 mg | ORAL_TABLET | Freq: Once | ORAL | Status: AC
  Administered 2021-12-06: 5 mg via ORAL
  Filled 2021-12-05: qty 1

## 2021-12-05 MED ORDER — TRANEXAMIC ACID (OHS) PUMP PRIME SOLUTION
2.0000 mg/kg | INTRAVENOUS | Status: DC
  Filled 2021-12-05: qty 2.02

## 2021-12-05 MED ORDER — TRANEXAMIC ACID 1000 MG/10ML IV SOLN
1.5000 mg/kg/h | INTRAVENOUS | Status: AC
  Administered 2021-12-06: 1.5 mg/kg/h via INTRAVENOUS
  Filled 2021-12-05: qty 25

## 2021-12-05 MED ORDER — POTASSIUM CHLORIDE 2 MEQ/ML IV SOLN
80.0000 meq | INTRAVENOUS | Status: DC
  Filled 2021-12-05: qty 40

## 2021-12-05 MED ORDER — INSULIN REGULAR(HUMAN) IN NACL 100-0.9 UT/100ML-% IV SOLN
INTRAVENOUS | Status: AC
  Administered 2021-12-06: .9 [IU]/h via INTRAVENOUS
  Filled 2021-12-05: qty 100

## 2021-12-05 MED ORDER — MAGNESIUM SULFATE 50 % IJ SOLN
40.0000 meq | INTRAMUSCULAR | Status: DC
  Filled 2021-12-05: qty 9.85

## 2021-12-05 MED ORDER — EPINEPHRINE HCL 5 MG/250ML IV SOLN IN NS
0.0000 ug/min | INTRAVENOUS | Status: DC
  Filled 2021-12-05: qty 250

## 2021-12-05 MED ORDER — CEFAZOLIN SODIUM-DEXTROSE 2-4 GM/100ML-% IV SOLN
2.0000 g | INTRAVENOUS | Status: DC
  Filled 2021-12-05: qty 100

## 2021-12-05 MED ORDER — CHLORHEXIDINE GLUCONATE 0.12 % MT SOLN
15.0000 mL | Freq: Once | OROMUCOSAL | Status: AC
  Administered 2021-12-06: 15 mL via OROMUCOSAL
  Filled 2021-12-05: qty 15

## 2021-12-05 MED ORDER — PLASMA-LYTE A IV SOLN
INTRAVENOUS | Status: DC
  Filled 2021-12-05: qty 2.5

## 2021-12-05 MED ORDER — MILRINONE LACTATE IN DEXTROSE 20-5 MG/100ML-% IV SOLN
0.3000 ug/kg/min | INTRAVENOUS | Status: DC
  Filled 2021-12-05: qty 100

## 2021-12-05 NOTE — H&P (View-Only) (Signed)
6 Days Post-Op Procedure(s) (LRB): LEFT HEART CATH AND CORONARY ANGIOGRAPHY (N/A) Subjective: Feels well, no CP  Objective: Vital signs in last 24 hours: Temp:  [98.4 F (36.9 C)] 98.4 F (36.9 C) (11/30 0518) Pulse Rate:  [61-74] 61 (11/30 0518) Cardiac Rhythm: Normal sinus rhythm (11/30 0946) Resp:  [16-17] 16 (11/30 0518) BP: (106-129)/(63-77) 129/70 (11/30 0946) SpO2:  [95 %-98 %] 98 % (11/30 0946)  Hemodynamic parameters for last 24 hours:    Intake/Output from previous day: 11/29 0701 - 11/30 0700 In: 541.5 [I.V.:541.5] Out: -  Intake/Output this shift: No intake/output data recorded.  General appearance: alert, cooperative, and no distress  Lab Results: Recent Labs    12/04/21 0348 12/05/21 0338  WBC 9.0 9.0  HGB 17.4* 17.3*  HCT 51.0 50.7  PLT 291 275   BMET:  Recent Labs    12/04/21 0348 12/05/21 0338  NA 132* 136  K 4.1 4.1  CL 103 101  CO2 22 20*  GLUCOSE 115* 120*  BUN 14 19  CREATININE 1.03 1.00  CALCIUM 8.8* 9.1    PT/INR: No results for input(s): "LABPROT", "INR" in the last 72 hours. ABG No results found for: "PHART", "HCO3", "TCO2", "ACIDBASEDEF", "O2SAT" CBG (last 3)  No results for input(s): "GLUCAP" in the last 72 hours.  Assessment/Plan: S/P Procedure(s) (LRB): LEFT HEART CATH AND CORONARY ANGIOGRAPHY (N/A) 3 vessel CAD For CABG tomorrow Plan LIMA + SVG if usable. If SVG unusable, will use left raidal.   Left radial not 1st choice due to lack of tight proximal stenosis of target vessel  All questions answered   LOS: 8 days    [name] 12/05/2021

## 2021-12-05 NOTE — Progress Notes (Signed)
Patient already on CPAP. Resting comfortably.

## 2021-12-05 NOTE — Progress Notes (Addendum)
Progress Note  Patient Name: QUIENTIN JENT Date of Encounter: 12/05/2021  Primary Cardiologist: Ida Rogue, MD  Subjective   He has no new complaints. No CP or SOB. CABG planned in AM.  Inpatient Medications    Scheduled Meds:  amLODipine  10 mg Oral Daily   aspirin EC  81 mg Oral Daily   cyanocobalamin  5,000 mcg Oral Daily   ezetimibe  10 mg Oral Daily   fluticasone  1 spray Each Nare Daily   irbesartan  300 mg Oral Daily   isosorbide mononitrate  60 mg Oral Daily   melatonin  5 mg Oral QHS   senna-docusate  1 tablet Oral BID   sodium chloride flush  3 mL Intravenous Q12H   thyroid  60 mg Oral q morning   Continuous Infusions:  sodium chloride     heparin 1,750 Units/hr (12/05/21 0343)   PRN Meds: sodium chloride, acetaminophen, nitroGLYCERIN, ondansetron (ZOFRAN) IV, sodium chloride flush   Vital Signs    Vitals:   12/04/21 0544 12/04/21 1010 12/04/21 1547 12/05/21 0518  BP: 124/77 136/79 106/63 124/77  Pulse: 74  74 61  Resp: _0 Temp: 97.7 F (36.5 C)  98.4 F (36.9 C) 98.4 F (36.9 C)  TempSrc: Oral  Oral Oral  SpO2:   95% 95%  Weight:      Height:        Intake/Output Summary (Last 24 hours) at 12/05/2021 0844 Last data filed at 12/04/2021 2359 Gross per 24 hour  Intake 419.86 ml  Output --  Net 419.86 ml      11/27/2021   11:38 PM 11/27/2021    6:05 PM 08/03/2021    2:03 PM  Last 3 Weights  Weight (lbs) 222 lb 8 oz 220 lb 219 lb 9.3 oz  Weight (kg) 100.925 kg 99.791 kg 99.6 kg     Telemetry    NSR - Personally Reviewed  ECG    No new tracings - Personally Reviewed  Physical Exam   GEN: No acute distress.  HEENT: Normocephalic, atraumatic, sclera non-icteric. Neck: No JVD or bruits. Cardiac: RRR no murmurs, rubs, or gallops.  Respiratory: Clear to auscultation bilaterally. Breathing is unlabored. GI: Soft, nontender, non-distended, BS +x 4. MS: no deformity. Extremities: No clubbing or cyanosis. No edema.  Distal pedal pulses are 2+ and equal bilaterally. Right radial cath site without hematoma or ecchymosis; good pulse. Neuro:  AAOx3. Follows commands. Psych:  Responds to questions appropriately with a normal affect.  Labs    High Sensitivity Troponin:   Recent Labs  Lab 11/27/21 1910 11/27/21 2135 11/27/21 2359 11/28/21 0943 11/28/21 1108  TROPONINIHS 610* 696* 741* 3,797* 4,207*      Cardiac EnzymesNo results for input(s): "TROPONINI" in the last 168 hours. No results for input(s): "TROPIPOC" in the last 168 hours.   Chemistry Recent Labs  Lab 12/03/21 0245 12/04/21 0348 12/05/21 0338  NA 135 132* 136  K 4.1 4.1 4.1  CL 102 103 101  CO2 22 22 20*  GLUCOSE 116* 115* 120*  BUN _1 CREATININE 1.08 1.03 1.00  CALCIUM 8.9 8.8* 9.1  GFRNONAA >60 >60 >60  ANIONGAP _2 Hematology Recent Labs  Lab 12/03/21 0245 12/04/21 0348 12/05/21 0338  WBC 10.1 9.0 9.0  RBC 5.47 5.94* 5.91*  HGB 16.3 17.4* 17.3*  HCT 47.0 51.0 50.7  MCV 85.9 85.9 85.8  MCH 29.8 29.3 29.3  MCHC 34.7 34.1  34.1  RDW 13.6 13.6 13.5  PLT 281 291 275    BNPNo results for input(s): "BNP", "PROBNP" in the last 168 hours.   DDimer No results for input(s): "DDIMER" in the last 168 hours.   Radiology    VAS US DOPPLER PRE CABG  Result Date: 12/03/2021 PREOPERATIVE VASCULAR EVALUATION Patient Name:  ARION SHANKLES  Date of Exam:   12/03/2021 Medical Rec #: [id number]           Accession #:    [id number] Date of Birth: July 02, 1950           Patient Gender: M Patient Age:   61 years Exam Location:  [facility] Procedure:      VAS US DOPPLER PRE CABG Referring Phys: Remo Lipps HENDRICKSON --------------------------------------------------------------------------------  Indications:  Pre-CABG. Risk Factors: Hypertension, hyperlipidemia, no history of smoking, prior MI,               coronary artery disease. Performing Technologist: Darlin Coco RDMS RVT  Examination Guidelines: A  complete evaluation includes B-mode imaging, spectral Doppler, color Doppler, and power Doppler as needed of all accessible portions of each vessel. Bilateral testing is considered an integral part of a complete examination. Limited examinations for reoccurring indications may be performed as noted.  Right Carotid Findings: +----------+--------+--------+--------+-----------+------------------+           PSV cm/sEDV cm/sStenosisDescribe   Comments           +----------+--------+--------+--------+-----------+------------------+ CCA Prox  77      14                                            +----------+--------+--------+--------+-----------+------------------+ CCA Distal77      16                         intimal thickening +----------+--------+--------+--------+-----------+------------------+ ICA Prox  76      16      1-39%   hyperechoic                   +----------+--------+--------+--------+-----------+------------------+ ICA Mid   53      10                                            +----------+--------+--------+--------+-----------+------------------+ ICA Distal38      13                                            +----------+--------+--------+--------+-----------+------------------+ ECA       112                                                   +----------+--------+--------+--------+-----------+------------------+ +----------+--------+-------+----------------+------------+           PSV cm/sEDV cmsDescribe        Arm Pressure +----------+--------+-------+----------------+------------+ [id number]             Multiphasic, WNL             +----------+--------+-------+----------------+------------+ +---------+--------+--+--------+-+---------+ VertebralPSV cm/s49EDV cm/s8Antegrade +---------+--------+--+--------+-+---------+  Left Carotid Findings: +----------+--------+--------+--------+------------+--------+           PSV cm/sEDV  cm/sStenosisDescribe    Comments +----------+--------+--------+--------+------------+--------+ CCA Prox  113     15                                   +----------+--------+--------+--------+------------+--------+ CCA Distal91      20                                   +----------+--------+--------+--------+------------+--------+ ICA Prox  79      19              heterogenous         +----------+--------+--------+--------+------------+--------+ ICA Mid   68      20                                   +----------+--------+--------+--------+------------+--------+ ICA Distal50      12                                   +----------+--------+--------+--------+------------+--------+ ECA       114     11                                   +----------+--------+--------+--------+------------+--------+  +----------+--------+--------+----------------+------------+ SubclavianPSV cm/sEDV cm/sDescribe        Arm Pressure +----------+--------+--------+----------------+------------+           143             Multiphasic, WNL             +----------+--------+--------+----------------+------------+ +---------+--------+--+--------+--+---------+ VertebralPSV cm/s57EDV cm/s16Antegrade +---------+--------+--+--------+--+---------+  ABI Findings: +--------+------------------+-----+---------+--------+ Right   Rt Pressure (mmHg)IndexWaveform Comment  +--------+------------------+-----+---------+--------+ Brachial                       triphasic         +--------+------------------+-----+---------+--------+ PTA                            triphasic         +--------+------------------+-----+---------+--------+ DP                             triphasic         +--------+------------------+-----+---------+--------+ +--------+------------------+-----+---------+-------+ Left    Lt Pressure (mmHg)IndexWaveform Comment  +--------+------------------+-----+---------+-------+ Brachial                       triphasic        +--------+------------------+-----+---------+-------+ PTA                            triphasic        +--------+------------------+-----+---------+-------+ DP                             triphasic        +--------+------------------+-----+---------+-------+  Right Doppler Findings: +--------+--------+-----+---------+--------+ Site    PressureIndexDoppler  Comments +--------+--------+-----+---------+--------+ Brachial  triphasic         +--------+--------+-----+---------+--------+ Radial               triphasic         +--------+--------+-----+---------+--------+ Ulnar                triphasic         +--------+--------+-----+---------+--------+  Left Doppler Findings: +--------+--------+-----+---------+--------+ Site    PressureIndexDoppler  Comments +--------+--------+-----+---------+--------+ Brachial             triphasic         +--------+--------+-----+---------+--------+ Radial               triphasic         +--------+--------+-----+---------+--------+ Ulnar                triphasic         +--------+--------+-----+---------+--------+   Summary: Right Carotid: Velocities in the right ICA are consistent with a 1-39% stenosis. Left Carotid: Velocities in the left ICA are consistent with a 1-39% stenosis. Vertebrals:  Bilateral vertebral arteries demonstrate antegrade flow. Subclavians: Normal flow hemodynamics were seen in bilateral subclavian              arteries. Right Upper Extremity: Doppler waveforms remain within normal limits with right radial compression. Doppler waveforms remain within normal limits with right ulnar compression. Left Upper Extremity: Doppler waveforms remain within normal limits with left radial compression. Doppler waveforms remain within normal limits with left ulnar compression.  Electronically signed by  Servando Snare MD on 12/03/2021 at 7:01:29 PM.    Final    VAS [place] LOWER EXTREMITY SAPHENOUS VEIN MAPPING  Result Date: 12/03/2021 LOWER EXTREMITY VEIN MAPPING Patient Name:  JESSE NOSBISCH  Date of Exam:   12/03/2021 Medical Rec #: [id number]           Accession #:    [id number] Date of Birth: [birth year]           Patient Gender: M Patient Age:   60 years Exam [facility]:  [facility] Procedure:      VAS [place] LOWER EXTREMITY SAPHENOUS VEIN MAPPING Referring Phys: Remo Lipps HENDRICKSON --------------------------------------------------------------------------------  Indications: Pre-op, CABG, varicosities LT>RT  Comparison Study: No prior studies. Performing Technologist: Darlin Coco RDMS, RVT  Examination Guidelines: A complete evaluation includes B-mode imaging, spectral Doppler, color Doppler, and power Doppler as needed of all accessible portions of each vessel. Bilateral testing is considered an integral part of a complete examination. Limited examinations for reoccurring indications may be performed as noted. +----------------+----------------+--------------------+-----------+-----------+ RT Diameter (cm)  RT Findings           GSV         LT DiameterLT Findings                                                        (cm)                +----------------+----------------+--------------------+-----------+-----------+       0.58                         Saphenofemoral      0.72  Junction                             +----------------+----------------+--------------------+-----------+-----------+       0.46                         Proximal thigh      0.47                +----------------+----------------+--------------------+-----------+-----------+       0.40                           Mid thigh         0.44                +----------------+----------------+--------------------+-----------+-----------+       0.44                           Distal thigh       0.40     branching  +----------------+----------------+--------------------+-----------+-----------+       0.36         branching            Knee           0.40     branching  +----------------+----------------+--------------------+-----------+-----------+       0.35                           Prox calf         0.26     branching  +----------------+----------------+--------------------+-----------+-----------+    0.20/0.39        multiple          Mid calf         0.47                                    branches                                               +----------------+----------------+--------------------+-----------+-----------+       0.39                          Distal calf        0.46                +----------------+----------------+--------------------+-----------+-----------+       0.40                             Ankle           0.46                +----------------+----------------+--------------------+-----------+-----------+ Diagnosing physician: Servando Snare MD Electronically signed by Servando Snare MD on 12/03/2021 at 7:01:22 PM.    Final     Cardiac Studies   Cath 11/29/21      Prox RCA (distal edge of stent) is 90% stenosed. Prox RCA to Dist RCA lesion is 100% stenosed.   Dist LM to Ost LAD lesion is 55% stenosed.   Prox LAD to Mid LAD stent is  5% stenosed. 1st Sept lesion is 95% stenosed.   Mid LAD to Dist LAD stent is 5% stenosed.   Dist LAD-1 lesion is 80% stenosed. Dist LAD-2 lesion is 95% stenosed.   Ost Cx to Prox Cx lesion is 55% stenosed. Prox Cx lesion is 70% stenosed. 1st Mrg lesion is 70% stenosed.   Ostial LPAV lesion is 99% stenosed.  Heavily calcified focal lesion.   Balloon angioplasty was attempted using a BALLN EMERGE MR 2.0X8.  Unable to cross with wire.  Therefore PTC aborted.  Post intervention, there is a 99% residual stenosis.   There is mild to moderate left ventricular systolic  dysfunction. The left ventricular ejection fraction is 35-45% by visual estimate.   LV end diastolic pressure is mildly elevated. - 15 mmHg   There is no aortic valve stenosis.   POST-OPERATIVE DIAGNOSIS:   Severe Multivessel CAD: Proximal RCA stent patent until the distal proximal stent is 100% stenosed with bridging collaterals filling marginal branches and faint left-to-right collaterals.  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  The superior OM1 branch is smaller with an ostial 60 to 70% stenosis.  The inferior branch is larger with proximal 20% stenosis. Ostial LAD roughly 50% followed by an ectatic segment just prior to SP1 which has eccentric 90% stenosis.  Just after SP1 there is a patent stent with trivial ISR.  There is mild diffuse disease leading up to the more distal stent that is patent but with distal edge 80% lesion followed by diffuse disease and a 95% focal lesion at the apex before the vessel wraps around the apex perfusing the distal third of the PDA territory. Poor imaging on LV gram, however there appears to be mid to apical anterior hypokinesis as well as inferior hypokinesis.  EF estimated 45%.   PLAN OF CARE:  Return to nursing for ongoing care.  Restart IV heparin after TR band removal.  Continue to titrate antianginal medications.  We will discuss with interventional colleagues to determine if other options that are available patient has been transferred to Team C.  Would proceed with 2D echo.. I have added low-dose amlodipine given his hypertension and need for antianginal therapy.  Apparently there was an issue  with beta-blockers.       Glenetta Hew, MD  2D echo 11/28/21    1. Left ventricular ejection fraction, by estimation, is 60 to 65%. The  left ventricle has normal function. The left ventricle has no regional  wall motion abnormalities. There is severe concentric left ventricular  hypertrophy. Left ventricular diastolic   parameters are consistent with Grade I diastolic dysfunction (impaired  relaxation).   2. Right ventricular systolic function is normal. The right ventricular  size is normal. Tricuspid regurgitation signal is inadequate for assessing  PA pressure.   3. Left atrial size was mildly dilated.   4. The mitral valve is normal in structure. Trivial mitral valve  regurgitation. No evidence of mitral stenosis.   5. The aortic valve is tricuspid. Aortic valve regurgitation is not  visualized. No aortic stenosis is present.   6. Aortic dilatation noted. There is mild dilatation of the aortic root,  measuring 40 mm.   Comparison(s): No significant change from prior study.   Patient Profile     71 y.o. male with CAD s/p remote PCI to LAD and RCA (2004, 2009, 4 stents total), HLD, OSA not on CPAP, hypothyroidism, HTN, prostate CA who was admitted with NSTEMI and is awaiting CABG.  Assessment & Plan    1. CAD/NSTEMI, HLD - cath 11/29/21 with severe MV CAD - PTCA attempted but aborted- received Brilinta up until last dose 12/01/21 when discontinued for planned CABG instead -> CVTS plans for tomorrow instead of waiting until Monday - EF ~45% by cath but poor images, 60-65% by cath - now on heparin per pharmacy, amlodipine 49m daily, irbesartan 3059mdaily, Imdur 6011maily, ASA 39m42mily, Zetia 10mg47mly -> reported statin/BB intolerance - lipid panel with LDL 82 -> started on Zetia this admission, will need OP labs arranged at follow-up - add baseline LFTs  2. Essential HTN - continue to follow in context above, controlled  3. Hypothyroidism - TSH wnl, continue home  armour thyroid rx  5. Mild carotid artery disease by duplex 12/03/21  - follow as OP  6. Mild dilation of aortic root - anticipate following clinically as OP; will defer to CVTS if they feel CTA is needed this admission to further clarify size - TEE planned with CABG  7. OSA - CPAP offered QHS  8. Elevated Hgb level - may need OP eval for blood dyscrasia  9. Pre-DM -A1C 5.7, f/u PCP  For questions or updates, please contact [facility] consult [email or web address] for contact info under Cardiology/STEMI.  Signed, DaynaCharlie PitterC 12/05/2021, 8:44 AM    Agree with findings by DaynaMelina Copa  Patient mated with non-STEMI.  Had failed PCI.  He has three-vessel disease with prior stenting.  He is remained cardiovascular stable.  He denies chest pain.  He is on IV heparin.  Plan for CABG tomorrow with Dr. HendrRoxan HockeynatLorretta Harp., FACP,[place],[facility],[place] [address] [phone] 9:34 AM

## 2021-12-05 NOTE — Progress Notes (Signed)
ANTICOAGULATION CONSULT NOTE Pharmacy Consult for heparin Indication: chest pain/ACS  Allergies  Allergen Reactions   Ace Inhibitors     cough   Beta Adrenergic Blockers     Fatigue   Codeine Other (See Comments)    Other reaction(s): Unknown   Lisinopril Cough   Statins     myalgias    Patient Measurements: Height: '5\' 8"'$  (172.7 cm) Weight: 100.9 kg (222 lb 8 oz) IBW/kg (Calculated) : 68.4 Heparin Dosing Weight: 87.8 kg   Vital Signs: Temp: 98.4 F (36.9 C) (11/30 0518) Temp Source: Oral (11/30 0518) BP: 124/77 (11/30 0518) Pulse Rate: 61 (11/30 0518)  Labs: Recent Labs    12/03/21 0245 12/04/21 0348 12/05/21 0338  HGB 16.3 17.4* 17.3*  HCT 47.0 51.0 50.7  PLT 281 291 275  HEPARINUNFRC 0.63 0.60 0.56  CREATININE 1.08 1.03 1.00     Estimated Creatinine Clearance: 79.1 mL/min (by C-G formula based on SCr of 1 mg/dL).   Medical History: Past Medical History:  Diagnosis Date   Cancer [facility])    prostate   Chest pain    Complication of anesthesia    fighting with waking up   Coronary artery disease    Heart attack [facility])    Hypercholesterolemia    Hyperlipidemia    Hypertension    Ischemic heart disease September 2004   Known--with non-Q-wave myocardial infarction    Obesity    Sleep apnea    Assessment: 71 year old male presenting with chest pain. Has history of NSTEMI (2004) s/p stents to RCA (2004) and LAD (2009). No history of anticoagulation PTA. Pharmacy has been consulted to dose heparin.  He is now s/p cath with failed attempt at PCI. Heparin level continues to be at goal (0.56) on 1750 units/hr. CBC stable.   Plan for surgery on 12/1  Goal of Therapy:  Heparin level 0.3-0.7 units/ml Monitor platelets by anticoagulation protocol: Yes   Plan: Continue heparin at 1750 units/hr  Monitor CBC and heparin level daily  Hildred Laser, PharmD Clinical Pharmacist **Pharmacist phone directory can now be found on [email or web address] ([email or web address]).  Listed under  [place].

## 2021-12-05 NOTE — Progress Notes (Signed)
CARDIAC REHAB PHASE I     Pt sitting in chair, feeling well today. Reports a good night rest. No questions or concerns regarding surgery tomorrow. States I'm ready to get this done and start recover process. Will continue to follow.    [id number]  Vanessa Barbara, RN BSN 12/05/2021 8:52 AM

## 2021-12-05 NOTE — Anesthesia Preprocedure Evaluation (Addendum)
Anesthesia Evaluation  Patient identified by MRN, date of birth, ID band Patient awake    Reviewed: Allergy & Precautions, NPO status , Patient's Chart, lab work & pertinent test results  History of Anesthesia Complications (+) Emergence Delirium and history of anesthetic complications  Airway Mallampati: II  TM Distance: >3 FB Neck ROM: Full    Dental  (+) Teeth Intact, Dental Advisory Given, Caps,    Pulmonary sleep apnea    Pulmonary exam normal breath sounds clear to auscultation       Cardiovascular hypertension, + angina  + CAD and + Past MI  Normal cardiovascular exam Rhythm:Regular Rate:Normal  Echo 11/28/21  1. Left ventricular ejection fraction, by estimation, is 60 to 65%. The  left ventricle has normal function. The left ventricle has no regional  wall motion abnormalities. There is severe concentric left ventricular  hypertrophy. Left ventricular diastolic   parameters are consistent with Grade I diastolic dysfunction (impaired  relaxation).   2. Right ventricular systolic function is normal. The right ventricular  size is normal. Tricuspid regurgitation signal is inadequate for assessing  PA pressure.   3. Left atrial size was mildly dilated.   4. The mitral valve is normal in structure. Trivial mitral valve  regurgitation. No evidence of mitral stenosis.   5. The aortic valve is tricuspid. Aortic valve regurgitation is not  visualized. No aortic stenosis is present.   6. Aortic dilatation noted. There is mild dilatation of the aortic root,  measuring 40 mm.      Neuro/Psych negative neurological ROS     GI/Hepatic negative GI ROS, Neg liver ROS,,,  Endo/Other  negative endocrine ROS    Renal/GU negative Renal ROS   Prostate cancer     Musculoskeletal negative musculoskeletal ROS (+)    Abdominal   Peds  Hematology negative hematology ROS (+)   Anesthesia Other Findings    Reproductive/Obstetrics                             Anesthesia Physical Anesthesia Plan  ASA: 4  Anesthesia Plan: General   Post-op Pain Management:    Induction: Intravenous  PONV Risk Score and Plan: 2 and Midazolam and Treatment may vary due to age or medical condition  Airway Management Planned: Oral ETT  Additional Equipment: Ultrasound Guidance Line Placement, TEE, PA Cath, CVP and Arterial line  Intra-op Plan:   Post-operative Plan: Post-operative intubation/ventilation  Informed Consent: I have reviewed the patients History and Physical, chart, labs and discussed the procedure including the risks, benefits and alternatives for the proposed anesthesia with the patient or authorized representative who has indicated his/her understanding and acceptance.     Dental advisory given  Plan Discussed with: CRNA  Anesthesia Plan Comments:         Anesthesia Quick Evaluation

## 2021-12-05 NOTE — Progress Notes (Signed)
6 Days Post-Op Procedure(s) (LRB): LEFT HEART CATH AND CORONARY ANGIOGRAPHY (N/A) Subjective: Feels well, no CP  Objective: Vital signs in last 24 hours: Temp:  [98.4 F (36.9 C)] 98.4 F (36.9 C) (11/30 0518) Pulse Rate:  [61-74] 61 (11/30 0518) Cardiac Rhythm: Normal sinus rhythm (11/30 0946) Resp:  [16-17] 16 (11/30 0518) BP: (106-129)/(63-77) 129/70 (11/30 0946) SpO2:  [95 %-98 %] 98 % (11/30 0946)  Hemodynamic parameters for last 24 hours:    Intake/Output from previous day: 11/29 0701 - 11/30 0700 In: 541.5 [I.V.:541.5] Out: -  Intake/Output this shift: No intake/output data recorded.  General appearance: alert, cooperative, and no distress  Lab Results: Recent Labs    12/04/21 0348 12/05/21 0338  WBC 9.0 9.0  HGB 17.4* 17.3*  HCT 51.0 50.7  PLT 291 275   BMET:  Recent Labs    12/04/21 0348 12/05/21 0338  NA 132* 136  K 4.1 4.1  CL 103 101  CO2 22 20*  GLUCOSE 115* 120*  BUN 14 19  CREATININE 1.03 1.00  CALCIUM 8.8* 9.1    PT/INR: No results for input(s): "LABPROT", "INR" in the last 72 hours. ABG No results found for: "PHART", "HCO3", "TCO2", "ACIDBASEDEF", "O2SAT" CBG (last 3)  No results for input(s): "GLUCAP" in the last 72 hours.  Assessment/Plan: S/P Procedure(s) (LRB): LEFT HEART CATH AND CORONARY ANGIOGRAPHY (N/A) 3 vessel CAD For CABG tomorrow Plan LIMA + SVG if usable. If SVG unusable, will use left raidal.   Left radial not 1st choice due to lack of tight proximal stenosis of target vessel  All questions answered   LOS: 8 days    Keith Murillo 12/05/2021

## 2021-12-06 ENCOUNTER — Inpatient Hospital Stay (HOSPITAL_COMMUNITY)
Admission: EM | Disposition: A | Discharge status: Home / Self Care | Payer: Self-pay | Source: Home / Self Care | Attending: Cardiology

## 2021-12-06 ENCOUNTER — Inpatient Hospital Stay (HOSPITAL_COMMUNITY): Payer: Medicare Other

## 2021-12-06 ENCOUNTER — Inpatient Hospital Stay (HOSPITAL_COMMUNITY): Payer: Medicare Other | Admitting: Anesthesiology

## 2021-12-06 ENCOUNTER — Other Ambulatory Visit: Payer: Self-pay

## 2021-12-06 DIAGNOSIS — I10 Essential (primary) hypertension: Secondary | ICD-10-CM

## 2021-12-06 DIAGNOSIS — G4730 Sleep apnea, unspecified: Secondary | ICD-10-CM | POA: Diagnosis not present

## 2021-12-06 DIAGNOSIS — I214 Non-ST elevation (NSTEMI) myocardial infarction: Secondary | ICD-10-CM | POA: Diagnosis not present

## 2021-12-06 DIAGNOSIS — I2510 Atherosclerotic heart disease of native coronary artery without angina pectoris: Secondary | ICD-10-CM

## 2021-12-06 DIAGNOSIS — I25119 Atherosclerotic heart disease of native coronary artery with unspecified angina pectoris: Secondary | ICD-10-CM | POA: Diagnosis not present

## 2021-12-06 DIAGNOSIS — Z951 Presence of aortocoronary bypass graft: Secondary | ICD-10-CM

## 2021-12-06 HISTORY — PX: CORONARY ARTERY BYPASS GRAFT: SHX141

## 2021-12-06 HISTORY — PX: TEE WITHOUT CARDIOVERSION: SHX5443

## 2021-12-06 LAB — POCT I-STAT 7, (LYTES, BLD GAS, ICA,H+H)
Acid-base deficit: 2 mmol/L (ref 0.0–2.0)
Acid-base deficit: 4 mmol/L — ABNORMAL HIGH (ref 0.0–2.0)
Acid-base deficit: 5 mmol/L — ABNORMAL HIGH (ref 0.0–2.0)
Acid-base deficit: 5 mmol/L — ABNORMAL HIGH (ref 0.0–2.0)
Acid-base deficit: 4 mmol/L — ABNORMAL HIGH (ref 0.0–2.0)
Acid-base deficit: 6 mmol/L — ABNORMAL HIGH (ref 0.0–2.0)
Bicarbonate: 24.3 mmol/L (ref 20.0–28.0)
Bicarbonate: 22.9 mmol/L (ref 20.0–28.0)
Bicarbonate: 23.8 mmol/L (ref 20.0–28.0)
Bicarbonate: 20.3 mmol/L (ref 20.0–28.0)
Bicarbonate: 21.8 mmol/L (ref 20.0–28.0)
Bicarbonate: 19.5 mmol/L — ABNORMAL LOW (ref 20.0–28.0)
Calcium, Ion: 1.07 mmol/L — ABNORMAL LOW (ref 1.15–1.40)
Calcium, Ion: 1.28 mmol/L (ref 1.15–1.40)
Calcium, Ion: 1.27 mmol/L (ref 1.15–1.40)
Calcium, Ion: 1.22 mmol/L (ref 1.15–1.40)
Calcium, Ion: 1.21 mmol/L (ref 1.15–1.40)
Calcium, Ion: 1.19 mmol/L (ref 1.15–1.40)
HCT: 37 % — ABNORMAL LOW (ref 39.0–52.0)
HCT: 33 % — ABNORMAL LOW (ref 39.0–52.0)
HCT: 45 % (ref 39.0–52.0)
HCT: 48 % (ref 39.0–52.0)
HCT: 48 % (ref 39.0–52.0)
HCT: 47 % (ref 39.0–52.0)
Hemoglobin: 12.6 g/dL — ABNORMAL LOW (ref 13.0–17.0)
Hemoglobin: 11.2 g/dL — ABNORMAL LOW (ref 13.0–17.0)
Hemoglobin: 15.3 g/dL (ref 13.0–17.0)
Hemoglobin: 16.3 g/dL (ref 13.0–17.0)
Hemoglobin: 16.3 g/dL (ref 13.0–17.0)
Hemoglobin: 16 g/dL (ref 13.0–17.0)
O2 Saturation: 100 %
O2 Saturation: 98 %
O2 Saturation: 86 %
O2 Saturation: 99 %
O2 Saturation: 97 %
O2 Saturation: 95 %
Patient temperature: 36.9
Patient temperature: 37.6
Patient temperature: 38
Patient temperature: 37.8
Potassium: 4.8 mmol/L (ref 3.5–5.1)
Potassium: 5.5 mmol/L — ABNORMAL HIGH (ref 3.5–5.1)
Potassium: 4.8 mmol/L (ref 3.5–5.1)
Potassium: 5.4 mmol/L — ABNORMAL HIGH (ref 3.5–5.1)
Potassium: 5 mmol/L (ref 3.5–5.1)
Potassium: 4.4 mmol/L (ref 3.5–5.1)
Sodium: 135 mmol/L (ref 135–145)
Sodium: 134 mmol/L — ABNORMAL LOW (ref 135–145)
Sodium: 137 mmol/L (ref 135–145)
Sodium: 135 mmol/L (ref 135–145)
Sodium: 136 mmol/L (ref 135–145)
Sodium: 138 mmol/L (ref 135–145)
TCO2: 26 mmol/L (ref 22–32)
TCO2: 24 mmol/L (ref 22–32)
TCO2: 26 mmol/L (ref 22–32)
TCO2: 21 mmol/L — ABNORMAL LOW (ref 22–32)
TCO2: 23 mmol/L (ref 22–32)
TCO2: 21 mmol/L — ABNORMAL LOW (ref 22–32)
pCO2 arterial: 46.2 mmHg (ref 32–48)
pCO2 arterial: 48.2 mmHg — ABNORMAL HIGH (ref 32–48)
pCO2 arterial: 59.9 mmHg — ABNORMAL HIGH (ref 32–48)
pCO2 arterial: 39.8 mmHg (ref 32–48)
pCO2 arterial: 42.1 mmHg (ref 32–48)
pCO2 arterial: 38.1 mmHg (ref 32–48)
pH, Arterial: 7.33 — ABNORMAL LOW (ref 7.35–7.45)
pH, Arterial: 7.284 — ABNORMAL LOW (ref 7.35–7.45)
pH, Arterial: 7.206 — ABNORMAL LOW (ref 7.35–7.45)
pH, Arterial: 7.319 — ABNORMAL LOW (ref 7.35–7.45)
pH, Arterial: 7.327 — ABNORMAL LOW (ref 7.35–7.45)
pH, Arterial: 7.322 — ABNORMAL LOW (ref 7.35–7.45)
pO2, Arterial: 330 mmHg — ABNORMAL HIGH (ref 83–108)
pO2, Arterial: 119 mmHg — ABNORMAL HIGH (ref 83–108)
pO2, Arterial: 63 mmHg — ABNORMAL LOW (ref 83–108)
pO2, Arterial: 168 mmHg — ABNORMAL HIGH (ref 83–108)
pO2, Arterial: 102 mmHg (ref 83–108)
pO2, Arterial: 84 mmHg (ref 83–108)

## 2021-12-06 LAB — PROTIME-INR
INR: 1.3 — ABNORMAL HIGH (ref 0.8–1.2)
Prothrombin Time: 16.3 seconds — ABNORMAL HIGH (ref 11.4–15.2)

## 2021-12-06 LAB — APTT: aPTT: 48 seconds — ABNORMAL HIGH (ref 24–36)

## 2021-12-06 LAB — BASIC METABOLIC PANEL
Anion gap: 5 (ref 5–15)
Anion gap: 9 (ref 5–15)
BUN: 25 mg/dL — ABNORMAL HIGH (ref 8–23)
BUN: 20 mg/dL (ref 8–23)
CO2: 27 mmol/L (ref 22–32)
CO2: 21 mmol/L — ABNORMAL LOW (ref 22–32)
Calcium: 9.1 mg/dL (ref 8.9–10.3)
Calcium: 8 mg/dL — ABNORMAL LOW (ref 8.9–10.3)
Chloride: 104 mmol/L (ref 98–111)
Chloride: 105 mmol/L (ref 98–111)
Creatinine, Ser: 1.25 mg/dL — ABNORMAL HIGH (ref 0.61–1.24)
Creatinine, Ser: 0.99 mg/dL (ref 0.61–1.24)
GFR, Estimated: >60 mL/min (ref >60)
GFR, Estimated: >60 mL/min (ref >60)
Glucose, Bld: 116 mg/dL — ABNORMAL HIGH (ref 70–99)
Glucose, Bld: 138 mg/dL — ABNORMAL HIGH (ref 70–99)
Potassium: 4.3 mmol/L (ref 3.5–5.1)
Potassium: 4.8 mmol/L (ref 3.5–5.1)
Sodium: 136 mmol/L (ref 135–145)
Sodium: 135 mmol/L (ref 135–145)

## 2021-12-06 LAB — POCT I-STAT, CHEM 8
BUN: 25 mg/dL — ABNORMAL HIGH (ref 8–23)
BUN: 23 mg/dL (ref 8–23)
BUN: 23 mg/dL (ref 8–23)
BUN: 24 mg/dL — ABNORMAL HIGH (ref 8–23)
BUN: 26 mg/dL — ABNORMAL HIGH (ref 8–23)
Calcium, Ion: 1.24 mmol/L (ref 1.15–1.40)
Calcium, Ion: 1.21 mmol/L (ref 1.15–1.40)
Calcium, Ion: 1.13 mmol/L — ABNORMAL LOW (ref 1.15–1.40)
Calcium, Ion: 1.01 mmol/L — ABNORMAL LOW (ref 1.15–1.40)
Calcium, Ion: 1.27 mmol/L (ref 1.15–1.40)
Chloride: 103 mmol/L (ref 98–111)
Chloride: 104 mmol/L (ref 98–111)
Chloride: 104 mmol/L (ref 98–111)
Chloride: 100 mmol/L (ref 98–111)
Chloride: 104 mmol/L (ref 98–111)
Creatinine, Ser: 1 mg/dL (ref 0.61–1.24)
Creatinine, Ser: 0.9 mg/dL (ref 0.61–1.24)
Creatinine, Ser: 0.9 mg/dL (ref 0.61–1.24)
Creatinine, Ser: 0.8 mg/dL (ref 0.61–1.24)
Creatinine, Ser: 0.9 mg/dL (ref 0.61–1.24)
Glucose, Bld: 106 mg/dL — ABNORMAL HIGH (ref 70–99)
Glucose, Bld: 110 mg/dL — ABNORMAL HIGH (ref 70–99)
Glucose, Bld: 128 mg/dL — ABNORMAL HIGH (ref 70–99)
Glucose, Bld: 167 mg/dL — ABNORMAL HIGH (ref 70–99)
Glucose, Bld: 173 mg/dL — ABNORMAL HIGH (ref 70–99)
HCT: 47 % (ref 39.0–52.0)
HCT: 43 % (ref 39.0–52.0)
HCT: 36 % — ABNORMAL LOW (ref 39.0–52.0)
HCT: 32 % — ABNORMAL LOW (ref 39.0–52.0)
HCT: 34 % — ABNORMAL LOW (ref 39.0–52.0)
Hemoglobin: 16 g/dL (ref 13.0–17.0)
Hemoglobin: 14.6 g/dL (ref 13.0–17.0)
Hemoglobin: 12.2 g/dL — ABNORMAL LOW (ref 13.0–17.0)
Hemoglobin: 10.9 g/dL — ABNORMAL LOW (ref 13.0–17.0)
Hemoglobin: 11.6 g/dL — ABNORMAL LOW (ref 13.0–17.0)
Potassium: 4.3 mmol/L (ref 3.5–5.1)
Potassium: 4.8 mmol/L (ref 3.5–5.1)
Potassium: 6 mmol/L — ABNORMAL HIGH (ref 3.5–5.1)
Potassium: 5.6 mmol/L — ABNORMAL HIGH (ref 3.5–5.1)
Potassium: 6.7 mmol/L (ref 3.5–5.1)
Sodium: 138 mmol/L (ref 135–145)
Sodium: 136 mmol/L (ref 135–145)
Sodium: 135 mmol/L (ref 135–145)
Sodium: 133 mmol/L — ABNORMAL LOW (ref 135–145)
Sodium: 135 mmol/L (ref 135–145)
TCO2: 26 mmol/L (ref 22–32)
TCO2: 24 mmol/L (ref 22–32)
TCO2: 23 mmol/L (ref 22–32)
TCO2: 22 mmol/L (ref 22–32)
TCO2: 25 mmol/L (ref 22–32)

## 2021-12-06 LAB — POCT I-STAT EG7
Acid-base deficit: 3 mmol/L — ABNORMAL HIGH (ref 0.0–2.0)
Bicarbonate: 23.8 mmol/L (ref 20.0–28.0)
Calcium, Ion: 1.11 mmol/L — ABNORMAL LOW (ref 1.15–1.40)
HCT: 38 % — ABNORMAL LOW (ref 39.0–52.0)
Hemoglobin: 12.9 g/dL — ABNORMAL LOW (ref 13.0–17.0)
O2 Saturation: 81 %
Potassium: 4.8 mmol/L (ref 3.5–5.1)
Sodium: 136 mmol/L (ref 135–145)
TCO2: 25 mmol/L (ref 22–32)
pCO2, Ven: 49.6 mmHg (ref 44–60)
pH, Ven: 7.288 (ref 7.25–7.43)
pO2, Ven: 51 mmHg — ABNORMAL HIGH (ref 32–45)

## 2021-12-06 LAB — CBC
HCT: 46.1 % (ref 39.0–52.0)
HCT: 44.8 % (ref 39.0–52.0)
HCT: 47 % (ref 39.0–52.0)
Hemoglobin: 16 g/dL (ref 13.0–17.0)
Hemoglobin: 15.5 g/dL (ref 13.0–17.0)
Hemoglobin: 16.2 g/dL (ref 13.0–17.0)
MCH: 29.6 pg (ref 26.0–34.0)
MCH: 30.2 pg (ref 26.0–34.0)
MCH: 29.6 pg (ref 26.0–34.0)
MCHC: 34.7 g/dL (ref 30.0–36.0)
MCHC: 34.6 g/dL (ref 30.0–36.0)
MCHC: 34.5 g/dL (ref 30.0–36.0)
MCV: 85.2 fL (ref 80.0–100.0)
MCV: 87.3 fL (ref 80.0–100.0)
MCV: 85.8 fL (ref 80.0–100.0)
Platelets: 294 10*3/uL (ref 150–400)
Platelets: 231 10*3/uL (ref 150–400)
Platelets: 266 10*3/uL (ref 150–400)
RBC: 5.41 MIL/uL (ref 4.22–5.81)
RBC: 5.13 MIL/uL (ref 4.22–5.81)
RBC: 5.48 MIL/uL (ref 4.22–5.81)
RDW: 13.7 % (ref 11.5–15.5)
RDW: 13.3 % (ref 11.5–15.5)
RDW: 13.2 % (ref 11.5–15.5)
WBC: 8.6 10*3/uL (ref 4.0–10.5)
WBC: 23.2 10*3/uL — ABNORMAL HIGH (ref 4.0–10.5)
WBC: 19.4 10*3/uL — ABNORMAL HIGH (ref 4.0–10.5)
nRBC: 0 % (ref 0.0–0.2)
nRBC: 0 % (ref 0.0–0.2)
nRBC: 0 % (ref 0.0–0.2)

## 2021-12-06 LAB — HEPATIC FUNCTION PANEL
ALT: 40 U/L (ref 0–44)
AST: 22 U/L (ref 15–41)
Albumin: 3.3 g/dL — ABNORMAL LOW (ref 3.5–5.0)
Alkaline Phosphatase: 54 U/L (ref 38–126)
Bilirubin, Direct: 0.1 mg/dL (ref 0.0–0.2)
Indirect Bilirubin: 0.5 mg/dL (ref 0.3–0.9)
Total Bilirubin: 0.6 mg/dL (ref 0.3–1.2)
Total Protein: 5.9 g/dL — ABNORMAL LOW (ref 6.5–8.1)

## 2021-12-06 LAB — MAGNESIUM: Magnesium: 2.8 mg/dL — ABNORMAL HIGH (ref 1.7–2.4)

## 2021-12-06 LAB — PLATELET COUNT: Platelets: 273 10*3/uL (ref 150–400)

## 2021-12-06 LAB — HEMOGLOBIN AND HEMATOCRIT, BLOOD
HCT: 36.6 % — ABNORMAL LOW (ref 39.0–52.0)
Hemoglobin: 12.4 g/dL — ABNORMAL LOW (ref 13.0–17.0)

## 2021-12-06 LAB — HEPARIN LEVEL (UNFRACTIONATED): Heparin Unfractionated: 0.7 IU/mL (ref 0.30–0.70)

## 2021-12-06 LAB — GLUCOSE, CAPILLARY
Glucose-Capillary: 135 mg/dL — ABNORMAL HIGH (ref 70–99)
Glucose-Capillary: 136 mg/dL — ABNORMAL HIGH (ref 70–99)
Glucose-Capillary: 136 mg/dL — ABNORMAL HIGH (ref 70–99)
Glucose-Capillary: 141 mg/dL — ABNORMAL HIGH (ref 70–99)
Glucose-Capillary: 142 mg/dL — ABNORMAL HIGH (ref 70–99)
Glucose-Capillary: 145 mg/dL — ABNORMAL HIGH (ref 70–99)
Glucose-Capillary: 146 mg/dL — ABNORMAL HIGH (ref 70–99)
Glucose-Capillary: 147 mg/dL — ABNORMAL HIGH (ref 70–99)
Glucose-Capillary: 151 mg/dL — ABNORMAL HIGH (ref 70–99)
Glucose-Capillary: 172 mg/dL — ABNORMAL HIGH (ref 70–99)
Glucose-Capillary: 178 mg/dL — ABNORMAL HIGH (ref 70–99)

## 2021-12-06 LAB — POCT ACTIVATED CLOTTING TIME: Activated Clotting Time: 0 seconds

## 2021-12-06 SURGERY — CORONARY ARTERY BYPASS GRAFTING (CABG)
Anesthesia: General | Site: Chest

## 2021-12-06 MED ORDER — VANCOMYCIN HCL IN DEXTROSE 1-5 GM/200ML-% IV SOLN
1000.0000 mg | Freq: Once | INTRAVENOUS | Status: AC
  Administered 2021-12-06: 1000 mg via INTRAVENOUS
  Filled 2021-12-06: qty 200

## 2021-12-06 MED ORDER — SODIUM CHLORIDE 0.45 % IV SOLN: INTRAVENOUS | Status: DC | PRN

## 2021-12-06 MED ORDER — PHENYLEPHRINE HCL-NACL 20-0.9 MG/250ML-% IV SOLN
0.0000 ug/min | INTRAVENOUS | Status: DC
  Administered 2021-12-06: 5 ug/min via INTRAVENOUS
  Filled 2021-12-06: qty 250

## 2021-12-06 MED ORDER — LIDOCAINE 2% (20 MG/ML) 5 ML SYRINGE
INTRAMUSCULAR | Status: AC
  Filled 2021-12-06: qty 5

## 2021-12-06 MED ORDER — ACETAMINOPHEN 650 MG RE SUPP
650.0000 mg | Freq: Once | RECTAL | Status: AC
  Administered 2021-12-06: 650 mg via RECTAL

## 2021-12-06 MED ORDER — MAGNESIUM SULFATE 4 GM/100ML IV SOLN
4.0000 g | Freq: Once | INTRAVENOUS | Status: AC
  Administered 2021-12-06: 4 g via INTRAVENOUS
  Filled 2021-12-06: qty 100

## 2021-12-06 MED ORDER — PROTAMINE SULFATE 10 MG/ML IV SOLN
INTRAVENOUS | Status: DC | PRN
  Administered 2021-12-06: 300 mg via INTRAVENOUS

## 2021-12-06 MED ORDER — FENTANYL CITRATE (PF) 250 MCG/5ML IJ SOLN
INTRAMUSCULAR | Status: AC
  Filled 2021-12-06: qty 5

## 2021-12-06 MED ORDER — OXYCODONE HCL 5 MG PO TABS: 5.0000 mg | ORAL_TABLET | ORAL | Status: DC | PRN

## 2021-12-06 MED ORDER — ASPIRIN 325 MG PO TBEC
325.0000 mg | DELAYED_RELEASE_TABLET | Freq: Every day | ORAL | Status: DC
  Administered 2021-12-07 – 2021-12-10 (×4): 325 mg via ORAL
  Filled 2021-12-06 (×5): qty 1

## 2021-12-06 MED ORDER — ALBUMIN HUMAN 5 % IV SOLN: INTRAVENOUS | Status: DC | PRN

## 2021-12-06 MED ORDER — SODIUM CHLORIDE 0.9 % IV SOLN: 250.0000 mL | INTRAVENOUS | Status: DC

## 2021-12-06 MED ORDER — PROPOFOL 10 MG/ML IV BOLUS
INTRAVENOUS | Status: DC | PRN
  Administered 2021-12-06: 10 mg via INTRAVENOUS
  Administered 2021-12-06: 20 mg via INTRAVENOUS
  Administered 2021-12-06: 70 mg via INTRAVENOUS
  Administered 2021-12-06: 10 mg via INTRAVENOUS
  Administered 2021-12-06: 20 mg via INTRAVENOUS

## 2021-12-06 MED ORDER — MIDAZOLAM HCL 2 MG/2ML IJ SOLN
2.0000 mg | INTRAMUSCULAR | Status: DC | PRN
  Filled 2021-12-06: qty 2

## 2021-12-06 MED ORDER — ROCURONIUM BROMIDE 10 MG/ML (PF) SYRINGE
PREFILLED_SYRINGE | INTRAVENOUS | Status: DC | PRN
  Administered 2021-12-06 (×4): 50 mg via INTRAVENOUS
  Administered 2021-12-06: 100 mg via INTRAVENOUS
  Administered 2021-12-06: 50 mg via INTRAVENOUS

## 2021-12-06 MED ORDER — SODIUM CHLORIDE 0.9 % IV SOLN: INTRAVENOUS | Status: DC | PRN

## 2021-12-06 MED ORDER — PLASMA-LYTE A IV SOLN: INTRAVENOUS | Status: DC | PRN

## 2021-12-06 MED ORDER — DEXMEDETOMIDINE HCL IN NACL 400 MCG/100ML IV SOLN
0.0000 ug/kg/h | INTRAVENOUS | Status: DC
  Administered 2021-12-06: 0.6 ug/kg/h via INTRAVENOUS
  Filled 2021-12-06: qty 100

## 2021-12-06 MED ORDER — HEMOSTATIC AGENTS (NO CHARGE) OPTIME
TOPICAL | Status: DC | PRN
  Administered 2021-12-06: 1 via TOPICAL

## 2021-12-06 MED ORDER — CHLORHEXIDINE GLUCONATE 0.12 % MT SOLN
15.0000 mL | OROMUCOSAL | Status: AC
  Administered 2021-12-06: 15 mL via OROMUCOSAL

## 2021-12-06 MED ORDER — THYROID 60 MG PO TABS
60.0000 mg | ORAL_TABLET | Freq: Every morning | ORAL | Status: DC
  Administered 2021-12-07 – 2021-12-10 (×4): 60 mg via ORAL
  Filled 2021-12-06 (×5): qty 1

## 2021-12-06 MED ORDER — SODIUM BICARBONATE 8.4 % IV SOLN
50.0000 meq | Freq: Once | INTRAVENOUS | Status: AC
  Administered 2021-12-06: 50 meq via INTRAVENOUS

## 2021-12-06 MED ORDER — TRAMADOL HCL 50 MG PO TABS
50.0000 mg | ORAL_TABLET | ORAL | Status: DC | PRN
  Administered 2021-12-07 (×2): 100 mg via ORAL
  Administered 2021-12-07: 50 mg via ORAL
  Administered 2021-12-07 – 2021-12-08 (×5): 100 mg via ORAL
  Administered 2021-12-10: 50 mg via ORAL
  Filled 2021-12-06: qty 2
  Filled 2021-12-06: qty 1
  Filled 2021-12-06: qty 2
  Filled 2021-12-06: qty 1
  Filled 2021-12-06 (×5): qty 2

## 2021-12-06 MED ORDER — BISACODYL 10 MG RE SUPP: 10.0000 mg | Freq: Every day | RECTAL | Status: DC

## 2021-12-06 MED ORDER — POTASSIUM CHLORIDE 10 MEQ/50ML IV SOLN: 10.0000 meq | INTRAVENOUS | Status: DC

## 2021-12-06 MED ORDER — PHENYLEPHRINE 80 MCG/ML (10ML) SYRINGE FOR IV PUSH (FOR BLOOD PRESSURE SUPPORT)
PREFILLED_SYRINGE | INTRAVENOUS | Status: DC | PRN
  Administered 2021-12-06 (×4): 40 ug via INTRAVENOUS
  Administered 2021-12-06: 120 ug via INTRAVENOUS
  Administered 2021-12-06: 40 ug via INTRAVENOUS
  Administered 2021-12-06: 80 ug via INTRAVENOUS
  Administered 2021-12-06 (×6): 40 ug via INTRAVENOUS

## 2021-12-06 MED ORDER — ARTIFICIAL TEARS OPHTHALMIC OINT
TOPICAL_OINTMENT | OPHTHALMIC | Status: DC | PRN
  Administered 2021-12-06: 1 via OPHTHALMIC

## 2021-12-06 MED ORDER — CHLORHEXIDINE GLUCONATE CLOTH 2 % EX PADS
6.0000 | MEDICATED_PAD | Freq: Every day | CUTANEOUS | Status: DC
  Administered 2021-12-06 – 2021-12-09 (×4): 6 via TOPICAL

## 2021-12-06 MED ORDER — NITROGLYCERIN IN D5W 200-5 MCG/ML-% IV SOLN
0.0000 ug/min | INTRAVENOUS | Status: DC
  Filled 2021-12-06: qty 250

## 2021-12-06 MED ORDER — ALBUMIN HUMAN 5 % IV SOLN: 250.0000 mL | INTRAVENOUS | Status: AC | PRN

## 2021-12-06 MED ORDER — PANTOPRAZOLE SODIUM 40 MG PO TBEC
40.0000 mg | DELAYED_RELEASE_TABLET | Freq: Every day | ORAL | Status: DC
  Administered 2021-12-08 – 2021-12-11 (×4): 40 mg via ORAL
  Filled 2021-12-06 (×4): qty 1

## 2021-12-06 MED ORDER — MIDAZOLAM HCL (PF) 5 MG/ML IJ SOLN
INTRAMUSCULAR | Status: DC | PRN
  Administered 2021-12-06 (×3): 2 mg via INTRAVENOUS
  Administered 2021-12-06: 1 mg via INTRAVENOUS

## 2021-12-06 MED ORDER — DOCUSATE SODIUM 100 MG PO CAPS
200.0000 mg | ORAL_CAPSULE | Freq: Every day | ORAL | Status: DC
  Administered 2021-12-07 – 2021-12-09 (×3): 200 mg via ORAL
  Filled 2021-12-06 (×3): qty 2

## 2021-12-06 MED ORDER — LACTATED RINGERS IV SOLN: INTRAVENOUS | Status: DC | PRN

## 2021-12-06 MED ORDER — FENTANYL CITRATE (PF) 250 MCG/5ML IJ SOLN
INTRAMUSCULAR | Status: DC | PRN
  Administered 2021-12-06 (×2): 50 ug via INTRAVENOUS
  Administered 2021-12-06: 250 ug via INTRAVENOUS
  Administered 2021-12-06: 100 ug via INTRAVENOUS
  Administered 2021-12-06: 50 ug via INTRAVENOUS
  Administered 2021-12-06 (×2): 100 ug via INTRAVENOUS
  Administered 2021-12-06: 200 ug via INTRAVENOUS
  Administered 2021-12-06: 50 ug via INTRAVENOUS
  Administered 2021-12-06: 250 ug via INTRAVENOUS
  Administered 2021-12-06: 50 ug via INTRAVENOUS

## 2021-12-06 MED ORDER — INSULIN REGULAR(HUMAN) IN NACL 100-0.9 UT/100ML-% IV SOLN: INTRAVENOUS | Status: DC

## 2021-12-06 MED ORDER — PROPOFOL 10 MG/ML IV BOLUS
INTRAVENOUS | Status: AC
  Filled 2021-12-06: qty 20

## 2021-12-06 MED ORDER — ROCURONIUM BROMIDE 10 MG/ML (PF) SYRINGE
PREFILLED_SYRINGE | INTRAVENOUS | Status: AC
  Filled 2021-12-06: qty 40

## 2021-12-06 MED ORDER — HEPARIN SODIUM (PORCINE) 1000 UNIT/ML IJ SOLN
INTRAMUSCULAR | Status: DC | PRN
  Administered 2021-12-06: 2000 [IU] via INTRAVENOUS
  Administered 2021-12-06: 33000 [IU] via INTRAVENOUS
  Administered 2021-12-06: 5000 [IU] via INTRAVENOUS

## 2021-12-06 MED ORDER — 0.9 % SODIUM CHLORIDE (POUR BTL) OPTIME
TOPICAL | Status: DC | PRN
  Administered 2021-12-06: 5000 mL

## 2021-12-06 MED ORDER — CEFAZOLIN SODIUM-DEXTROSE 2-4 GM/100ML-% IV SOLN
2.0000 g | Freq: Three times a day (TID) | INTRAVENOUS | Status: AC
  Administered 2021-12-06 – 2021-12-08 (×6): 2 g via INTRAVENOUS
  Filled 2021-12-06 (×6): qty 100

## 2021-12-06 MED ORDER — SODIUM CHLORIDE 0.9% FLUSH: 3.0000 mL | INTRAVENOUS | Status: DC | PRN

## 2021-12-06 MED ORDER — SODIUM CHLORIDE 0.9 % IV SOLN: INTRAVENOUS | Status: DC

## 2021-12-06 MED ORDER — ACETAMINOPHEN 160 MG/5ML PO SOLN: 650.0000 mg | Freq: Once | ORAL | Status: AC

## 2021-12-06 MED ORDER — EZETIMIBE 10 MG PO TABS
10.0000 mg | ORAL_TABLET | Freq: Every day | ORAL | Status: DC
  Administered 2021-12-07 – 2021-12-11 (×5): 10 mg via ORAL
  Filled 2021-12-06 (×5): qty 1

## 2021-12-06 MED ORDER — LACTATED RINGERS IV SOLN: 500.0000 mL | Freq: Once | INTRAVENOUS | Status: DC | PRN

## 2021-12-06 MED ORDER — PHENYLEPHRINE 80 MCG/ML (10ML) SYRINGE FOR IV PUSH (FOR BLOOD PRESSURE SUPPORT)
PREFILLED_SYRINGE | INTRAVENOUS | Status: AC
  Filled 2021-12-06: qty 20

## 2021-12-06 MED ORDER — ONDANSETRON HCL 4 MG/2ML IJ SOLN
4.0000 mg | Freq: Four times a day (QID) | INTRAMUSCULAR | Status: DC | PRN
  Administered 2021-12-07 – 2021-12-08 (×3): 4 mg via INTRAVENOUS
  Filled 2021-12-06 (×3): qty 2

## 2021-12-06 MED ORDER — LACTATED RINGERS IV SOLN: INTRAVENOUS | Status: DC

## 2021-12-06 MED ORDER — MIDAZOLAM HCL (PF) 10 MG/2ML IJ SOLN
INTRAMUSCULAR | Status: AC
  Filled 2021-12-06: qty 2

## 2021-12-06 MED ORDER — FENTANYL CITRATE PF 50 MCG/ML IJ SOSY
50.0000 ug | PREFILLED_SYRINGE | INTRAMUSCULAR | Status: DC | PRN
  Administered 2021-12-06: 50 ug via INTRAVENOUS
  Administered 2021-12-06: 100 ug via INTRAVENOUS
  Administered 2021-12-06: 50 ug via INTRAVENOUS
  Administered 2021-12-07 (×6): 100 ug via INTRAVENOUS
  Filled 2021-12-06 (×3): qty 2
  Filled 2021-12-06: qty 1
  Filled 2021-12-06: qty 2
  Filled 2021-12-06: qty 1
  Filled 2021-12-06: qty 2
  Filled 2021-12-06: qty 1
  Filled 2021-12-06 (×2): qty 2

## 2021-12-06 MED ORDER — ASPIRIN 81 MG PO CHEW: 324.0000 mg | CHEWABLE_TABLET | Freq: Every day | ORAL | Status: DC

## 2021-12-06 MED ORDER — ACETAMINOPHEN 500 MG PO TABS
1000.0000 mg | ORAL_TABLET | Freq: Four times a day (QID) | ORAL | Status: DC
  Administered 2021-12-07 – 2021-12-11 (×17): 1000 mg via ORAL
  Filled 2021-12-06 (×18): qty 2

## 2021-12-06 MED ORDER — SODIUM CHLORIDE 0.9% FLUSH
3.0000 mL | Freq: Two times a day (BID) | INTRAVENOUS | Status: DC
  Administered 2021-12-07 – 2021-12-08 (×4): 3 mL via INTRAVENOUS

## 2021-12-06 MED ORDER — FAMOTIDINE IN NACL 20-0.9 MG/50ML-% IV SOLN
20.0000 mg | Freq: Two times a day (BID) | INTRAVENOUS | Status: DC
  Administered 2021-12-06: 20 mg via INTRAVENOUS
  Filled 2021-12-06: qty 50

## 2021-12-06 MED ORDER — SODIUM CHLORIDE (PF) 0.9 % IJ SOLN: OROMUCOSAL | Status: DC | PRN

## 2021-12-06 MED ORDER — ACETAMINOPHEN 160 MG/5ML PO SOLN: 1000.0000 mg | Freq: Four times a day (QID) | ORAL | Status: DC

## 2021-12-06 MED ORDER — DEXTROSE 50 % IV SOLN: 0.0000 mL | INTRAVENOUS | Status: DC | PRN

## 2021-12-06 MED ORDER — BISACODYL 5 MG PO TBEC
10.0000 mg | DELAYED_RELEASE_TABLET | Freq: Every day | ORAL | Status: DC
  Administered 2021-12-07 – 2021-12-09 (×3): 10 mg via ORAL
  Filled 2021-12-06 (×3): qty 2

## 2021-12-06 SURGICAL SUPPLY — 115 items
ADAPTER MULTI PERFUSION 15 (ADAPTER) ×1 IMPLANT
ADH SKN CLS APL DERMABOND .7 (GAUZE/BANDAGES/DRESSINGS) ×3
APPLIER CLIP 9.375 SM OPEN (CLIP)
APR CLP SM 9.3 20 MLT OPN (CLIP)
BAG DECANTER FOR FLEXI CONT (MISCELLANEOUS) ×4 IMPLANT
BLADE CLIPPER SURG (BLADE) ×5 IMPLANT
BLADE NDL 3 SS STRL (BLADE) IMPLANT
BLADE NEEDLE 3 SS STRL (BLADE) ×3 IMPLANT
BLADE STERNUM SYSTEM 6 (BLADE) ×4 IMPLANT
BLADE SURG 15 STRL LF DISP TIS (BLADE) ×1 IMPLANT
BLADE SURG 15 STRL SS (BLADE) ×3
BNDG ELASTIC 4X5.8 VLCR STR LF (GAUZE/BANDAGES/DRESSINGS) ×4 IMPLANT
BNDG ELASTIC 6X5.8 VLCR STR LF (GAUZE/BANDAGES/DRESSINGS) ×4 IMPLANT
BNDG GAUZE DERMACEA FLUFF 4 (GAUZE/BANDAGES/DRESSINGS) ×4 IMPLANT
BNDG GZE DERMACEA 4 6PLY (GAUZE/BANDAGES/DRESSINGS) ×3
CANISTER SUCT 3000ML PPV (MISCELLANEOUS) ×4 IMPLANT
CANNULA EZ GLIDE AORTIC 21FR (CANNULA) ×1 IMPLANT
CANNULA MC2 2 STG 36/46 NON-V (CANNULA) ×1 IMPLANT
CANNULA VENOUS 2 STG 34/46 (CANNULA) ×3
CATH CPB KIT HENDRICKSON (MISCELLANEOUS) ×4 IMPLANT
CATH ROBINSON RED A/P 18FR (CATHETERS) ×4 IMPLANT
CATH THORACIC 36FR (CATHETERS) ×4 IMPLANT
CATH THORACIC 36FR RT ANG (CATHETERS) ×4 IMPLANT
CLIP APPLIE 9.375 SM OPEN (CLIP) IMPLANT
CLIP TI WIDE RED SMALL 24 (CLIP) ×2 IMPLANT
CLIP VESOCCLUDE MED 24/CT (CLIP) IMPLANT
CLIP VESOCCLUDE SM WIDE 24/CT (CLIP) IMPLANT
CONTAINER PROTECT SURGISLUSH (MISCELLANEOUS) ×8 IMPLANT
COVER MAYO STAND STRL (DRAPES) ×1 IMPLANT
CUFF TOURN SGL QUICK 18X4 (TOURNIQUET CUFF) IMPLANT
CUFF TOURN SGL QUICK 24 (TOURNIQUET CUFF)
CUFF TRNQT CYL 24X4X16.5-23 (TOURNIQUET CUFF) IMPLANT
DERMABOND ADVANCED .7 DNX12 (GAUZE/BANDAGES/DRESSINGS) ×1 IMPLANT
DRAIN CHANNEL 28F RND 3/8 FF (WOUND CARE) ×1 IMPLANT
DRAPE CARDIOVASCULAR INCISE (DRAPES) ×3
DRAPE EXTREMITY T 121X128X90 (DISPOSABLE) ×4 IMPLANT
DRAPE HALF SHEET 40X57 (DRAPES) ×1 IMPLANT
DRAPE SRG 135X102X78XABS (DRAPES) ×4 IMPLANT
DRAPE WARM FLUID 44X44 (DRAPES) ×4 IMPLANT
DRSG COVADERM 4X14 (GAUZE/BANDAGES/DRESSINGS) ×4 IMPLANT
ELECT REM PT RETURN 9FT ADLT (ELECTROSURGICAL) ×6
ELECTRODE REM PT RTRN 9FT ADLT (ELECTROSURGICAL) ×8 IMPLANT
FELT TEFLON 1X6 (MISCELLANEOUS) ×8 IMPLANT
GAUZE 4X4 16PLY ~~LOC~~+RFID DBL (SPONGE) ×3 IMPLANT
GAUZE SPONGE 4X4 12PLY STRL (GAUZE/BANDAGES/DRESSINGS) ×8 IMPLANT
GEL ULTRASOUND 20GR AQUASONIC (MISCELLANEOUS) IMPLANT
GLOVE BIO SURGEON STRL SZ7 (GLOVE) ×2 IMPLANT
GLOVE BIOGEL PI IND STRL 6 (GLOVE) ×3 IMPLANT
GLOVE BIOGEL PI IND STRL 7.5 (GLOVE) ×2 IMPLANT
GLOVE BIOGEL PI IND STRL 9 (GLOVE) ×1 IMPLANT
GLOVE SS BIOGEL STRL SZ 7.5 (GLOVE) ×4 IMPLANT
GLOVE SURG SIGNA 7.5 PF LTX (GLOVE) ×12 IMPLANT
GLOVE SURG SS PI 7.0 STRL IVOR (GLOVE) ×3 IMPLANT
GLOVE SURG SS PI 7.5 STRL IVOR (GLOVE) ×1 IMPLANT
GLOVE SURG SS PI 8.0 STRL IVOR (GLOVE) ×1 IMPLANT
GOWN STRL REUS W/ TWL LRG LVL3 (GOWN DISPOSABLE) ×16 IMPLANT
GOWN STRL REUS W/ TWL XL LVL3 (GOWN DISPOSABLE) ×9 IMPLANT
GOWN STRL REUS W/TWL LRG LVL3 (GOWN DISPOSABLE) ×12
GOWN STRL REUS W/TWL XL LVL3 (GOWN DISPOSABLE) ×9
HEMOSTAT POWDER SURGIFOAM 1G (HEMOSTASIS) ×12 IMPLANT
HEMOSTAT SURGICEL 2X14 (HEMOSTASIS) ×4 IMPLANT
INSERT FOGARTY XLG (MISCELLANEOUS) IMPLANT
KIT BASIN OR (CUSTOM PROCEDURE TRAY) ×4 IMPLANT
KIT SUCTION CATH 14FR (SUCTIONS) ×8 IMPLANT
KIT TURNOVER KIT B (KITS) ×4 IMPLANT
KIT VASOVIEW HEMOPRO 2 VH 4000 (KITS) ×4 IMPLANT
MARKER GRAFT CORONARY BYPASS (MISCELLANEOUS) ×12 IMPLANT
NS IRRIG 1000ML POUR BTL (IV SOLUTION) ×20 IMPLANT
PACK E OPEN HEART (SUTURE) ×4 IMPLANT
PACK OPEN HEART (CUSTOM PROCEDURE TRAY) ×4 IMPLANT
PAD ARMBOARD 7.5X6 YLW CONV (MISCELLANEOUS) ×8 IMPLANT
PAD ELECT DEFIB RADIOL ZOLL (MISCELLANEOUS) ×4 IMPLANT
PENCIL BUTTON HOLSTER BLD 10FT (ELECTRODE) ×5 IMPLANT
POSITIONER HEAD DONUT 9IN (MISCELLANEOUS) ×4 IMPLANT
PUNCH AORTIC ROTATE 4.0MM (MISCELLANEOUS) IMPLANT
PUNCH AORTIC ROTATE 4.5MM 8IN (MISCELLANEOUS) ×1 IMPLANT
PUNCH AORTIC ROTATE 5MM 8IN (MISCELLANEOUS) IMPLANT
SET MPS 3-ND DEL (MISCELLANEOUS) ×1 IMPLANT
SHEARS HARMONIC 9CM CVD (BLADE) ×3 IMPLANT
SPONGE T-LAP 18X18 ~~LOC~~+RFID (SPONGE) ×13 IMPLANT
SPONGE T-LAP 4X18 ~~LOC~~+RFID (SPONGE) ×3 IMPLANT
SUPPORT HEART JANKE-BARRON (MISCELLANEOUS) ×4 IMPLANT
SUT BONE WAX W31G (SUTURE) ×4 IMPLANT
SUT MNCRL AB 4-0 PS2 18 (SUTURE) IMPLANT
SUT PROLENE 3 0 SH DA (SUTURE) ×4 IMPLANT
SUT PROLENE 4 0 RB 1 (SUTURE) ×3
SUT PROLENE 4 0 SH DA (SUTURE) IMPLANT
SUT PROLENE 4-0 RB1 .5 CRCL 36 (SUTURE) ×1 IMPLANT
SUT PROLENE 6 0 C 1 30 (SUTURE) ×9 IMPLANT
SUT PROLENE 7 0 BV 1 (SUTURE) ×3 IMPLANT
SUT PROLENE 7 0 BV1 MDA (SUTURE) ×4 IMPLANT
SUT PROLENE 8 0 BV175 6 (SUTURE) ×2 IMPLANT
SUT STEEL 6MS V (SUTURE) ×4 IMPLANT
SUT STEEL STERNAL CCS#1 18IN (SUTURE) IMPLANT
SUT STEEL SZ 6 DBL 3X14 BALL (SUTURE) ×4 IMPLANT
SUT VIC AB 1 CTX 36 (SUTURE) ×6
SUT VIC AB 1 CTX36XBRD ANBCTR (SUTURE) ×8 IMPLANT
SUT VIC AB 2-0 CT1 27 (SUTURE) ×3
SUT VIC AB 2-0 CT1 TAPERPNT 27 (SUTURE) ×1 IMPLANT
SUT VIC AB 2-0 CTX 27 (SUTURE) IMPLANT
SUT VIC AB 2-0 CTX 36 (SUTURE) ×1 IMPLANT
SUT VIC AB 3-0 SH 27 (SUTURE)
SUT VIC AB 3-0 SH 27X BRD (SUTURE) IMPLANT
SUT VIC AB 3-0 X1 27 (SUTURE) ×1 IMPLANT
SUT VICRYL 4-0 PS2 18IN ABS (SUTURE) IMPLANT
SYR 50ML SLIP (SYRINGE) IMPLANT
SYSTEM SAHARA CHEST DRAIN ATS (WOUND CARE) ×4 IMPLANT
TAPE CLOTH SURG 4X10 WHT LF (GAUZE/BANDAGES/DRESSINGS) ×1 IMPLANT
TAPE PAPER 2X10 WHT MICROPORE (GAUZE/BANDAGES/DRESSINGS) ×1 IMPLANT
TOWEL GREEN STERILE (TOWEL DISPOSABLE) ×5 IMPLANT
TOWEL GREEN STERILE FF (TOWEL DISPOSABLE) ×4 IMPLANT
TRAY FOLEY SLVR 16FR TEMP STAT (SET/KITS/TRAYS/PACK) ×4 IMPLANT
TUBING LAP HI FLOW INSUFFLATIO (TUBING) ×4 IMPLANT
UNDERPAD 30X36 HEAVY ABSORB (UNDERPADS AND DIAPERS) ×4 IMPLANT
WATER STERILE IRR 1000ML POUR (IV SOLUTION) ×8 IMPLANT

## 2021-12-06 NOTE — Interval H&P Note (Signed)
History and Physical Interval Note:  12/06/2021 7:26 AM  Keith Murillo  has presented today for surgery, with the diagnosis of CAD.  The various methods of treatment have been discussed with the patient and family. After consideration of risks, benefits and other options for treatment, the patient has consented to  Procedure(s): CORONARY ARTERY BYPASS GRAFTING (CABG) (N/A) Possible, RADIAL ARTERY HARVEST (Left) TRANSESOPHAGEAL ECHOCARDIOGRAM (TEE) (N/A) as a surgical intervention.  The patient's history has been reviewed, patient examined, no change in status, stable for surgery.  I have reviewed the patient's chart and labs.  Questions were answered to the patient's satisfaction.     [name]

## 2021-12-06 NOTE — Discharge Instructions (Signed)

## 2021-12-06 NOTE — Anesthesia Procedure Notes (Signed)
Arterial Line Insertion Start/End12/01/2021 6:40 AM, 12/06/2021 6:42 AM Performed by: Wilburn Cornelia, CRNA, CRNA  Patient location: Pre-op. Preanesthetic checklist: patient identified, IV checked, site marked, risks and benefits discussed, surgical consent, monitors and equipment checked, pre-op evaluation, timeout performed and anesthesia consent Lidocaine 1% used for infiltration Right, radial was placed Catheter size: 20 G Hand hygiene performed  and maximum sterile barriers used  Allen's test indicative of satisfactory collateral circulation Attempts: 1 Procedure performed without using ultrasound guided technique. Following insertion, Biopatch and dressing applied. Post procedure assessment: normal  Patient tolerated the procedure well with no immediate complications.

## 2021-12-06 NOTE — Anesthesia Procedure Notes (Signed)
Procedure Name: Intubation Date/Time: 12/06/2021 7:43 AM  Performed by: Wilburn Cornelia, CRNAPre-anesthesia Checklist: Patient identified, Emergency Drugs available, Suction available, Patient being monitored and Timeout performed Patient Re-evaluated:Patient Re-evaluated prior to induction Oxygen Delivery Method: Circle system utilized Preoxygenation: Pre-oxygenation with 100% oxygen Induction Type: IV induction Ventilation: Mask ventilation without difficulty Laryngoscope Size: Mac and 4 Grade View: Grade II Tube type: Oral Tube size: 8.0 mm Airway Equipment and Method: Stylet Placement Confirmation: ETT inserted through vocal cords under direct vision, positive ETCO2, CO2 detector and breath sounds checked- equal and bilateral Secured at: 23 cm Tube secured with: Tape Dental Injury: Teeth and Oropharynx as per pre-operative assessment

## 2021-12-06 NOTE — Interval H&P Note (Signed)
History and Physical Interval Note:  12/06/2021 7:26 AM  [name]  has presented today for surgery, with the diagnosis of CAD.  The various methods of treatment have been discussed with the patient and family. After consideration of risks, benefits and other options for treatment, the patient has consented to  Procedure(s): CORONARY ARTERY BYPASS GRAFTING (CABG) (N/A) Possible, RADIAL ARTERY HARVEST (Left) TRANSESOPHAGEAL ECHOCARDIOGRAM (TEE) (N/A) as a surgical intervention.  The patient's history has been reviewed, patient examined, no change in status, stable for surgery.  I have reviewed the patient's chart and labs.  Questions were answered to the patient's satisfaction.     Keith Murillo

## 2021-12-06 NOTE — Transfer of Care (Signed)
Immediate Anesthesia Transfer of Care Note  Patient: Keith Murillo  Procedure(s) Performed: CORONARY ARTERY BYPASS GRAFTING (CABG) X 3 BYPASSES USING LEFT INTERNAL MAMMARY ARTERY AND RIGHT LEG GREATER SAPHENOUS VEIN HARVESTED ENDOSCOPICALLY (Chest) Possible, RADIAL ARTERY HARVEST (Left) TRANSESOPHAGEAL ECHOCARDIOGRAM (TEE)  Patient Location: SICU  Anesthesia Type:General  Level of Consciousness: Patient remains intubated per anesthesia plan  Airway & Oxygen Therapy: Patient remains intubated per anesthesia plan and Patient placed on Ventilator (see vital sign flow sheet for setting)  Post-op Assessment: Report given to RN and Post -op Vital signs reviewed and stable  Post vital signs: Reviewed and stable  Last Vitals:  Vitals Value Taken Time  BP 115/58   Temp    Pulse 80 12/06/21 1308  Resp 11 12/06/21 1308  SpO2 91 % 12/06/21 1308  Vitals shown include unvalidated device data.  Last Pain:  Vitals:   12/06/21 0458  TempSrc: Oral  PainSc:       Patients Stated Pain Goal: 0 (61/95/09 3267)  Complications: No notable events documented.

## 2021-12-06 NOTE — Op Note (Unsigned)
NAME: Keith Murillo, Keith Murillo MEDICAL RECORD NO: [id number] ACCOUNT NO: [id number] DATE OF BIRTH: 1950-09-20 FACILITY: [facility] LOCATION: [facility] PHYSICIAN: Revonda Standard. Roxan Hockey, MD  Operative Report   DATE OF PROCEDURE: 12/06/2021  PREOPERATIVE DIAGNOSIS:  Three-vessel coronary artery disease, status post non-ST elevation myocardial infarction.  POSTOPERATIVE DIAGNOSIS:  Three-vessel coronary artery disease, status post non-ST elevation myocardial infarction.  PROCEDURE PERFORMED:  Median sternotomy, extracorporeal circulation, coronary artery bypass grafting x3 (saphenous vein graft to the distal left anterior descending and sequential left internal mammary artery to obtuse marginals 1 and 2).  SURGEON:  Revonda Standard. Roxan Hockey, MD  ASSISTANT:  Enid Cutter, PA  ANESTHESIA:  General.  FINDINGS:  Intraoperative TEE showed ejection fraction of approximately 50% with mild mitral regurgitation pre and post-bypass.  Left mammary and saphenous vein good quality. LAD diffusely diseased with extensive stents.  OM1 and 2 good quality vessels,  OM2 intramyocardial.  No graftable targets in the right coronary distribution.  CLINICAL NOTE:  The patient is a 71 year old man with known coronary artery disease with previous percutaneous intervention and multiple cardiac risk factors.  He presented with sudden onset of chest pain and ruled in for non-ST elevation MI.   Catheterization revealed severe 3-vessel coronary artery disease, not amenable to percutaneous intervention.  He was referred for coronary artery bypass grafting.  The indications, risks, benefits, and alternatives were discussed in detail with the  patient.  He understood and accepted the risks and agreed to proceed.  OPERATIVE NOTE:  The patient was brought to the preoperative holding area on 12/06/2021.  Anesthesia placed an arterial blood pressure monitoring line and placed a Swan-Ganz catheter.  He was taken to the operating room,  anesthetized and intubated. A  Foley catheter was placed.  Intravenous antibiotics were administered.  The chest, abdomen and legs were prepped and draped in the usual sterile fashion.  Dr. Gifford Shave performed transesophageal echocardiography. Findings as noted above.  Please see his  separately dictated note for full details of the procedure.  A timeout was performed.  A median sternotomy was performed.  The left internal mammary artery was harvested using standard technique.  Simultaneously, an incision was made in the medial aspect of the right leg at the level of the knee, the greater  saphenous vein was identified and was harvested from the right thigh endoscopically.  Saphenous vein was of good quality as was the mammary artery.  2000 units of heparin was administered during the vessel harvest.  The remainder of the full heparin dose  was given prior to opening the pericardium.  After harvesting the mammary artery, a sternal retractor was placed and was gradually opened over time.  The pericardium was opened.  The ascending aorta was inspected.  There was no evidence of atherosclerotic disease.  After confirming adequate  anticoagulation with ACT measurement, the aorta was cannulated via concentric 2-0 Ethibond pledgeted pursestring sutures.  A dual stage venous cannula was placed via a pursestring suture in the right atrial appendage.  Cardiopulmonary bypass was  initiated.  Flows were maintained per protocol.  The patient was cooled to 34 degrees Celsius.  The coronary arteries were inspected and anastomotic sites were chosen.  There were no graftable targets in the right coronary distribution.  The conduits  were inspected and prepared.  A foam pad was placed in the pericardium to insulate the heart.  A temperature probe was placed in the myocardial septum and a cardioplegia cannula was placed in the ascending aorta.  The aorta was  crossclamped.  The left ventricle was emptied via the aortic root  vent.  Cardiac arrest then was achieved with a combination of cold antegrade blood cardioplegia and topical iced saline, 1.5 liters of cardioplegia was administered.  There  was rapid diastolic arrest and there was septal cooling to 12 degrees Celsius.  1500 mL of cardioplegia was administered.  A reverse saphenous vein graft then was placed end-to-side to the distal LAD.  This graft was placed very distally due to extensive  stenting throughout the vessel.  It was relatively free of plaque at the site, but there was disease distal, the probe did pass retrograde into the main body of the LAD.  The vein was anastomosed end-to-side with a running 7-0 Prolene suture.  At the  completion of the graft, cardioplegia was administered and there was good flow and good hemostasis.  The heart then was elevated, exposing the lateral wall.  OM1 and 2 ran in close proximity to each other.  These were both good quality vessels.  OM1 was 1.5 mm vessel and OM2 was a 2 mm vessel.  OM2 was intramyocardial.  A side-to-side anastomosis was  performed of the mammary to the OM1 with a running 8-0 Prolene suture.  A probe passed easily proximally and distally and then the distal anastomosis was also performed with a running 8-0 Prolene suture in an end-to-side fashion and again a probe passed  easily proximally and distally.  The bulldog clamp was briefly removed to inspect for hemostasis.  The mammary pedicle was tacked to the epicardial surface of the heart with 6-0 Prolene suture.  The bulldog clamps were replaced. Additional cardioplegia  was administered.  The vein graft was cut to length.  The proximal anastomosis for the vein graft was performed to 4.5 mm punch aortotomy with running 6-0 Prolene suture.  At the completion of the proximal anastomosis, the patient was placed in  Trendelenburg position.  Lidocaine was administered.  The aortic root was de-aired.  The bulldog clamp was again removed from the left mammary artery  and the aortic crossclamp was removed.  The total crossclamp time was 69 minutes.  The patient was initially in heart block.  While rewarming was completed, all proximal and distal anastomoses were inspected for hemostasis.  Epicardial pacing wires were placed on the right ventricle and right atrium.  When the patient had rewarmed to a  core temperature of 37 degrees Celsius, he was weaned from cardiopulmonary bypass on the first attempt.  He did not require inotropic support.  He was DOO paced at 80 beats per minute.  Post-bypass transesophageal echocardiography was unchanged from the  prebypass study.  A test dose protamine was administered and was well tolerated.  The atrial and aortic cannulae were removed.  The remainder of the protamine was administered without incident.  The chest was irrigated with warm saline.  Hemostasis was achieved.  The  pericardium was not closed.  Left pleural and mediastinal chest tubes were placed through separate subcostal incisions.  The sternum was closed with a combination of single and double heavy gauge stainless steel wires.  Pectoralis fascia, subcutaneous  tissue and skin were closed in standard fashion.  All sponge, needle and instrument counts were correct at the end of the procedure.  The patient was taken from the operating room to the surgical intensive care unit, intubated and in good condition.  Experienced assistance was necessary for this case due to surgical complexity.  Myron Roddenberry harvested the saphenous vein  endoscopically and then provided exposure, retraction of delicate tissues, suture management and assistance during the  anastomoses.   SHW D: 12/06/2021 5:55:18 pm T: 12/06/2021 10:23:00 pm  JOB: [id number]/ [id number]

## 2021-12-06 NOTE — Progress Notes (Signed)
CT surgery PM rounds  Stable after multivessel CABG earlier today Starting to wake up from anesthesia Plan Vent wean soon Stable dynamics with minimal pressors   Blood pressure 114/80, pulse 80, temperature 100 F (37.8 C), resp. rate 15, height '5\' 8"'$  (1.727 m), weight 100.9 kg, SpO2 98 %.

## 2021-12-06 NOTE — Procedures (Signed)
Extubation Procedure Note  Patient Details:   Name: Keith Murillo DOB: 02/07/1950 MRN: [id number]   Airway Documentation:    Vent end date: 12/06/21 Vent end time: 1820   Evaluation  O2 sats: stable throughout Complications: No apparent complications Patient did tolerate procedure well. Bilateral Breath Sounds: Clear, Diminished   Yes  Patient extubated per protocol to 4L [place] with no apparent complications. Positive cuff leak was noted prior to extubation. Patient achieved NIF of -20 and VC of .8L with good effort. Patient is alert, has strong cough, and is able to speak. Vitals are stable. RT will continue to monitor.   Deforrest Bogle Clyda Greener 12/06/2021, 6:28 PM

## 2021-12-06 NOTE — Hospital Course (Signed)
Requests for follow-up appointments sent on 12/08/2021  Referring cardiologist:  Glenetta Hew, MD  [facility]:[id number], Dalbert Batman, FNP  HPI:  Presented with chest pain.   Keith Murillo is a 71 year old man with a past medical history significant for CAD, MI, stents to LAD and RCA (2004 in 2009), hypertension, hyperlipidemia, hypothyroidism, obesity, sleep apnea, prostate cancer.   He presented last week with sudden onset of left-sided chest tightness radiating to his left arm.  He had an episode the evening before admission and took a baby aspirin.  His pain resolved.  Then he had another episode the following day which led to him seeking medical care.  The symptoms were similar to the symptoms he had prior to his previous stents.  In retrospect he been having milder episodes of this over the past 6 months or so.   His troponin was elevated initially at 397 and peaked at 4207.  He was admitted and started on intravenous heparin.  He underwent cardiac catheterization on 11/29/2021 which showed severe three-vessel disease.  An attempt was made to angioplasty a large bifurcating OM branch, but was unsuccessful.     He has been pain-free since admission.  He was on Brilinta and received his last dose on the evening of Sunday, 12/01/2021.  Dr. Roxan Hockey reviewed the patient's chart, labs and diagnostic studies and determined surgical revascularization would provide this patient the best long term treatment. Dr. Roxan Hockey reviewed the patient's treatment options as well as the risks and benefits of surgery. Keith Murillo was agreeable to proceed with coronary artery bypass grafting surgery.  Hospital Course: Keith Murillo was an inpatient at [facility] since 11/28/21. He remained chest pain free while in the hospital. He was on Brillinta, his last dose was on 12/01/21. A 7 day washout period was recommended but he chose to proceed with surgery after a 4 day washout period. He was brought to  the operating room on 12/06/21. He underwent a CABG x 3 utilizing LIMA to OM1 and OM2 sequentially and SVG to distal LAD. He tolerated the procedure well and was transferred to the SICU in stable condition.  He was weaned from the ventilator and extubated.  He had significant postoperative pain not well controlled with the IV fentanyl so he was started on IV Toradol on postop day 1.  Postoperative hypertension was managed with oral diltiazem due to his beta-blocker allergy.  By postop day 2, the chest tube drainage had subsided so the drainage tubes and monitoring lines were all removed.  Diltiazem continued for management of hypertension.  He was mobilized while in the ICU.  He was ready for transfer to [facility].

## 2021-12-06 NOTE — Brief Op Note (Addendum)
11/27/2021 - 12/06/2021  11:45 AM  PATIENT:  Keith Murillo  71 y.o. male  PRE-OPERATIVE DIAGNOSIS:  Coronary Artery Disease  POST-OPERATIVE DIAGNOSIS:  Coronary Artery Disease  PROCEDURE:  CORONARY ARTERY BYPASS GRAFTING (CABG) X 3 BYPASSES USING LEFT INTERNAL MAMMARY ARTERY AND RIGHT LEG GREATER SAPHENOUS VEIN HARVESTED ENDOSCOPICALLY   LIMA->OM1->OM2 (sequential) SVG-> distal LAD  Vein harvest time: 57mn Vein prep time: 124m   TRANSESOPHAGEAL ECHOCARDIOGRAM   SURGEON:  HeMelrose NakayamaMD   PHYSICIAN ASSISTANT: Roddenberry  ASSISTANTS: WaDineen KidRN, RN First Assistant         Beck, MoElgie CollardRN, Scrub Person   ANESTHESIA:   general  EBL:  [id number] ADMINISTERED:none  DRAINS:  Bialteral pleural and mediastinal drains    LOCAL MEDICATIONS USED:  NONE  COUNTS:  Correct  DICTATION: .Dragon Dictation  PLAN OF CARE: Admit to inpatient   PATIENT DISPOSITION:  ICU - intubated and hemodynamically stable.   Delay start of Pharmacological VTE agent (>24hrs) due to surgical blood loss or risk of bleeding: yes  FINDINGS:  OM 1 and 2 good quality, OM 2 intramyocardial LAD diffusely diseased with extensive stents No graftable targets in RCA distribution Good quality LIMA and SV XC= 69 min CPB = 103 min  Iver Fehrenbach C. HenRoxan HockeyD [facility] [phone]

## 2021-12-06 NOTE — Progress Notes (Signed)
  Echocardiogram Echocardiogram Transesophageal has been performed.  Keith Murillo 12/06/2021, 1:03 PM

## 2021-12-06 NOTE — Anesthesia Procedure Notes (Signed)
Central Venous Catheter Insertion Performed by: Santa Lighter, MD, anesthesiologist Start/End12/01/2021 6:45 AM, 12/06/2021 6:55 AM Patient location: Pre-op. Preanesthetic checklist: patient identified, IV checked, site marked, risks and benefits discussed, surgical consent, monitors and equipment checked, pre-op evaluation, timeout performed and anesthesia consent Position: Trendelenburg Lidocaine 1% used for infiltration and patient sedated Hand hygiene performed , maximum sterile barriers used  and Seldinger technique used Catheter size: 9 Fr Central line was placed.MAC introducer Procedure performed using ultrasound guided technique. Ultrasound Notes:anatomy identified, needle tip was noted to be adjacent to the nerve/plexus identified, no ultrasound evidence of intravascular and/or intraneural injection and image(s) printed for medical record Attempts: 1 Following insertion, line sutured, dressing applied and Biopatch. Post procedure assessment: free fluid flow, blood return through all ports and no air  Patient tolerated the procedure well with no immediate complications.

## 2021-12-06 NOTE — Anesthesia Procedure Notes (Signed)
Central Venous Catheter Insertion Performed by: Santa Lighter, MD, anesthesiologist Start/End12/01/2021 6:55 AM, 12/06/2021 7:05 AM Patient location: Pre-op. Preanesthetic checklist: patient identified, IV checked, site marked, risks and benefits discussed, surgical consent, monitors and equipment checked, pre-op evaluation and timeout performed Position: Trendelenburg Hand hygiene performed  and maximum sterile barriers used  Total catheter length 100. PA cath was placed.Swan type:thermodilution PA Cath depth:60 Procedure performed without using ultrasound guided technique. Attempts: 1 Patient tolerated the procedure well with no immediate complications.

## 2021-12-07 ENCOUNTER — Inpatient Hospital Stay (HOSPITAL_COMMUNITY): Payer: Medicare Other

## 2021-12-07 LAB — BASIC METABOLIC PANEL
Anion gap: 9 (ref 5–15)
Anion gap: 8 (ref 5–15)
BUN: 19 mg/dL (ref 8–23)
BUN: 21 mg/dL (ref 8–23)
CO2: 22 mmol/L (ref 22–32)
CO2: 26 mmol/L (ref 22–32)
Calcium: 8.2 mg/dL — ABNORMAL LOW (ref 8.9–10.3)
Calcium: 8.4 mg/dL — ABNORMAL LOW (ref 8.9–10.3)
Chloride: 104 mmol/L (ref 98–111)
Chloride: 102 mmol/L (ref 98–111)
Creatinine, Ser: 0.99 mg/dL (ref 0.61–1.24)
Creatinine, Ser: 1.03 mg/dL (ref 0.61–1.24)
GFR, Estimated: >60 mL/min (ref >60)
GFR, Estimated: >60 mL/min (ref >60)
Glucose, Bld: 139 mg/dL — ABNORMAL HIGH (ref 70–99)
Glucose, Bld: 141 mg/dL — ABNORMAL HIGH (ref 70–99)
Potassium: 4.6 mmol/L (ref 3.5–5.1)
Potassium: 4.2 mmol/L (ref 3.5–5.1)
Sodium: 135 mmol/L (ref 135–145)
Sodium: 136 mmol/L (ref 135–145)

## 2021-12-07 LAB — CBC
HCT: 45.5 % (ref 39.0–52.0)
HCT: 46.7 % (ref 39.0–52.0)
Hemoglobin: 15.9 g/dL (ref 13.0–17.0)
Hemoglobin: 15.3 g/dL (ref 13.0–17.0)
MCH: 30.2 pg (ref 26.0–34.0)
MCH: 29.2 pg (ref 26.0–34.0)
MCHC: 34.9 g/dL (ref 30.0–36.0)
MCHC: 32.8 g/dL (ref 30.0–36.0)
MCV: 86.3 fL (ref 80.0–100.0)
MCV: 89.1 fL (ref 80.0–100.0)
Platelets: 247 10*3/uL (ref 150–400)
Platelets: 260 10*3/uL (ref 150–400)
RBC: 5.27 MIL/uL (ref 4.22–5.81)
RBC: 5.24 MIL/uL (ref 4.22–5.81)
RDW: 13.6 % (ref 11.5–15.5)
RDW: 13.9 % (ref 11.5–15.5)
WBC: 16.3 10*3/uL — ABNORMAL HIGH (ref 4.0–10.5)
WBC: 16.4 10*3/uL — ABNORMAL HIGH (ref 4.0–10.5)
nRBC: 0 % (ref 0.0–0.2)
nRBC: 0 % (ref 0.0–0.2)

## 2021-12-07 LAB — GLUCOSE, CAPILLARY
Glucose-Capillary: 121 mg/dL — ABNORMAL HIGH (ref 70–99)
Glucose-Capillary: 125 mg/dL — ABNORMAL HIGH (ref 70–99)
Glucose-Capillary: 127 mg/dL — ABNORMAL HIGH (ref 70–99)
Glucose-Capillary: 132 mg/dL — ABNORMAL HIGH (ref 70–99)
Glucose-Capillary: 133 mg/dL — ABNORMAL HIGH (ref 70–99)
Glucose-Capillary: 135 mg/dL — ABNORMAL HIGH (ref 70–99)
Glucose-Capillary: 143 mg/dL — ABNORMAL HIGH (ref 70–99)
Glucose-Capillary: 145 mg/dL — ABNORMAL HIGH (ref 70–99)
Glucose-Capillary: 148 mg/dL — ABNORMAL HIGH (ref 70–99)
Glucose-Capillary: 152 mg/dL — ABNORMAL HIGH (ref 70–99)
Glucose-Capillary: 154 mg/dL — ABNORMAL HIGH (ref 70–99)
Glucose-Capillary: 161 mg/dL — ABNORMAL HIGH (ref 70–99)

## 2021-12-07 LAB — MAGNESIUM
Magnesium: 2.2 mg/dL (ref 1.7–2.4)
Magnesium: 2.3 mg/dL (ref 1.7–2.4)

## 2021-12-07 LAB — ECHO INTRAOPERATIVE TEE
Height: 68 in
S' Lateral: 3.2 cm
Weight: 3560 oz

## 2021-12-07 MED ORDER — KETOROLAC TROMETHAMINE 30 MG/ML IJ SOLN
30.0000 mg | Freq: Four times a day (QID) | INTRAMUSCULAR | Status: DC | PRN
  Administered 2021-12-07 – 2021-12-08 (×2): 30 mg via INTRAVENOUS
  Filled 2021-12-07 (×2): qty 1

## 2021-12-07 MED ORDER — DILTIAZEM HCL ER 90 MG PO CP12
90.0000 mg | ORAL_CAPSULE | Freq: Two times a day (BID) | ORAL | Status: DC
  Administered 2021-12-07 – 2021-12-09 (×6): 90 mg via ORAL
  Filled 2021-12-07 (×7): qty 1

## 2021-12-07 MED ORDER — FUROSEMIDE 10 MG/ML IJ SOLN
20.0000 mg | Freq: Two times a day (BID) | INTRAMUSCULAR | Status: DC
  Administered 2021-12-07 – 2021-12-08 (×3): 20 mg via INTRAVENOUS
  Filled 2021-12-07 (×3): qty 2

## 2021-12-07 MED ORDER — ENOXAPARIN SODIUM 40 MG/0.4ML IJ SOSY
40.0000 mg | PREFILLED_SYRINGE | Freq: Every day | INTRAMUSCULAR | Status: DC
  Administered 2021-12-07 – 2021-12-08 (×2): 40 mg via SUBCUTANEOUS
  Filled 2021-12-07 (×2): qty 0.4

## 2021-12-07 MED ORDER — MIDAZOLAM HCL 2 MG/2ML IJ SOLN: 2.0000 mg | INTRAMUSCULAR | Status: DC | PRN

## 2021-12-07 MED ORDER — INSULIN ASPART 100 UNIT/ML IJ SOLN
0.0000 [IU] | INTRAMUSCULAR | Status: DC
  Administered 2021-12-07 (×2): 2 [IU] via SUBCUTANEOUS
  Administered 2021-12-07 (×2): 4 [IU] via SUBCUTANEOUS
  Administered 2021-12-08: 2 [IU] via SUBCUTANEOUS
  Administered 2021-12-08: 4 [IU] via SUBCUTANEOUS
  Administered 2021-12-08: 2 [IU] via SUBCUTANEOUS

## 2021-12-07 MED ORDER — FENTANYL CITRATE PF 50 MCG/ML IJ SOSY: 50.0000 ug | PREFILLED_SYRINGE | INTRAMUSCULAR | Status: DC | PRN

## 2021-12-07 NOTE — Progress Notes (Signed)
1 Day Post-Op Procedure(s) (LRB): CORONARY ARTERY BYPASS GRAFTING (CABG) X 3 BYPASSES USING LEFT INTERNAL MAMMARY ARTERY AND RIGHT LEG GREATER SAPHENOUS VEIN HARVESTED ENDOSCOPICALLY (N/A) TRANSESOPHAGEAL ECHOCARDIOGRAM (TEE) (N/A) Subjective: Hi iv fentanyl requirement- start prn toradol  Objective: Vital signs in last 24 hours: Temp:  [97.7 F (36.5 C)-100.4 F (38 C)] 97.7 F (36.5 C) (12/02 0900) Pulse Rate:  [79-96] 83 (12/02 0900) Cardiac Rhythm: Normal sinus rhythm (12/02 0800) Resp:  [10-32] 20 (12/02 0900) BP: (114-123)/(76-81) 123/76 (12/02 0900) SpO2:  [89 %-100 %] 94 % (12/02 0900) Arterial Line BP: (96-280)/(52-200) 155/68 (12/02 0900) FiO2 (%):  [36 %-50 %] 36 % (12/01 1823) Weight:  [102.3 kg] 102.3 kg (12/02 0500)  Hemodynamic parameters for last 24 hours: PAP: (16-47)/(6-28) 25/10 CO:  [3.9 L/min-8.3 L/min] 6 L/min CI:  [1.8 L/min/m2-3.9 L/min/m2] 2.8 L/min/m2  Intake/Output from previous day: 12/01 0701 - 12/02 0700 In: 5304.1 [I.V.:2925.1; Blood:1250; IV Piggyback:1129] Out: 0998 [Urine:2835; Chest Tube:510] Intake/Output this shift: Total I/O In: 22.4 [I.V.:22.4] Out: 100 [Urine:100]       Exam    General- alert and comfortable    Neck- no JVD, no cervical adenopathy palpable, no carotid bruit   Lungs- clear without rales, wheezes   Cor- regular rate and rhythm, no murmur , gallop   Abdomen- soft, non-tender   Extremities - warm, non-tender, minimal edema   Neuro- oriented, appropriate, no focal weakness   Lab Results: Recent Labs    12/06/21 1759 12/06/21 1806 12/06/21 1923 12/07/21 0401  WBC 19.4*  --   --  16.3*  HGB 16.2   < > 16.0 15.9  HCT 47.0   < > 47.0 45.5  PLT 266  --   --  247   < > = values in this interval not displayed.   BMET:  Recent Labs    12/06/21 1759 12/06/21 1806 12/06/21 1923 12/07/21 0401  NA 135   < > 138 135  K 4.8   < > 4.4 4.6  CL 105  --   --  104  CO2 21*  --   --  22  GLUCOSE 138*  --   --  139*   BUN 20  --   --  19  CREATININE 0.99  --   --  0.99  CALCIUM 8.0*  --   --  8.2*   < > = values in this interval not displayed.    PT/INR:  Recent Labs    12/06/21 1319  LABPROT 16.3*  INR 1.3*   ABG    Component Value Date/Time   PHART 7.322 (L) 12/06/2021 1923   HCO3 19.5 (L) 12/06/2021 1923   TCO2 21 (L) 12/06/2021 1923   ACIDBASEDEF 6.0 (H) 12/06/2021 1923   O2SAT 95 12/06/2021 1923   CBG (last 3)  Recent Labs    12/07/21 0400 12/07/21 0507 12/07/21 0657  GLUCAP 154* 121* 152*    Assessment/Plan: S/P Procedure(s) (LRB): CORONARY ARTERY BYPASS GRAFTING (CABG) X 3 BYPASSES USING LEFT INTERNAL MAMMARY ARTERY AND RIGHT LEG GREATER SAPHENOUS VEIN HARVESTED ENDOSCOPICALLY (N/A) TRANSESOPHAGEAL ECHOCARDIOGRAM (TEE) (N/A) Mobilize Diuresis d/c tubes/lines See progression orders Allergic to beta blockers- start po diltiazem for BP control   LOS: 10 days    Keith Murillo 12/07/2021

## 2021-12-07 NOTE — Progress Notes (Signed)
CT surgery PM rounds  Patient had a good day with walk in hallway Maintained sinus rhythm Will try CPAP mask this p.m. Urine output 1 L this shift P.m. labs satisfactory   basic metabolic panel pending Hemoglobin 15.3 platelets 260 K WBC 16.4  Blood pressure 128/70, pulse 73, temperature 97.8 F (36.6 C), temperature source Oral, resp. rate (!) 21, height '5\' 8"'$  (1.727 m), weight 102.3 kg, SpO2 95 %.

## 2021-12-07 NOTE — Anesthesia Postprocedure Evaluation (Signed)
Anesthesia Post Note  Patient: Keith Murillo  Procedure(s) Performed: CORONARY ARTERY BYPASS GRAFTING (CABG) X 3 BYPASSES USING LEFT INTERNAL MAMMARY ARTERY AND RIGHT LEG GREATER SAPHENOUS VEIN HARVESTED ENDOSCOPICALLY (Chest) TRANSESOPHAGEAL ECHOCARDIOGRAM (TEE)     Patient location during evaluation: SICU Anesthesia Type: General Level of consciousness: sedated Pain management: pain level controlled Vital Signs Assessment: post-procedure vital signs reviewed and stable Respiratory status: patient remains intubated per anesthesia plan Cardiovascular status: stable Postop Assessment: no apparent nausea or vomiting Anesthetic complications: no   No notable events documented.  Last Vitals:  Vitals:   12/07/21 1730 12/07/21 1800  BP: 128/70 128/71  Pulse: 73 74  Resp: (!) 21 (!) 28  Temp:    SpO2: 95% 94%    Last Pain:  Vitals:   12/07/21 1754  TempSrc:   PainSc: [place] Kamika Goodloe

## 2021-12-08 ENCOUNTER — Inpatient Hospital Stay (HOSPITAL_COMMUNITY): Payer: Medicare Other

## 2021-12-08 LAB — CBC
HCT: 43.5 % (ref 39.0–52.0)
Hemoglobin: 14.4 g/dL (ref 13.0–17.0)
MCH: 29.4 pg (ref 26.0–34.0)
MCHC: 33.1 g/dL (ref 30.0–36.0)
MCV: 89 fL (ref 80.0–100.0)
Platelets: 261 10*3/uL (ref 150–400)
RBC: 4.89 MIL/uL (ref 4.22–5.81)
RDW: 13.8 % (ref 11.5–15.5)
WBC: 16.9 10*3/uL — ABNORMAL HIGH (ref 4.0–10.5)
nRBC: 0 % (ref 0.0–0.2)

## 2021-12-08 LAB — GLUCOSE, CAPILLARY
Glucose-Capillary: 125 mg/dL — ABNORMAL HIGH (ref 70–99)
Glucose-Capillary: 166 mg/dL — ABNORMAL HIGH (ref 70–99)
Glucose-Capillary: 126 mg/dL — ABNORMAL HIGH (ref 70–99)
Glucose-Capillary: 112 mg/dL — ABNORMAL HIGH (ref 70–99)
Glucose-Capillary: 141 mg/dL — ABNORMAL HIGH (ref 70–99)

## 2021-12-08 LAB — BASIC METABOLIC PANEL
Anion gap: 9 (ref 5–15)
BUN: 22 mg/dL (ref 8–23)
CO2: 28 mmol/L (ref 22–32)
Calcium: 8.5 mg/dL — ABNORMAL LOW (ref 8.9–10.3)
Chloride: 99 mmol/L (ref 98–111)
Creatinine, Ser: 0.9 mg/dL (ref 0.61–1.24)
GFR, Estimated: >60 mL/min (ref >60)
Glucose, Bld: 115 mg/dL — ABNORMAL HIGH (ref 70–99)
Potassium: 4.3 mmol/L (ref 3.5–5.1)
Sodium: 136 mmol/L (ref 135–145)

## 2021-12-08 MED ORDER — MIDAZOLAM HCL 2 MG/2ML IJ SOLN: 1.0000 mg | Freq: Four times a day (QID) | INTRAMUSCULAR | Status: DC | PRN

## 2021-12-08 MED ORDER — SODIUM CHLORIDE 0.9% FLUSH
3.0000 mL | Freq: Two times a day (BID) | INTRAVENOUS | Status: DC
  Administered 2021-12-08 (×2): 3 mL via INTRAVENOUS

## 2021-12-08 MED ORDER — FUROSEMIDE 10 MG/ML IJ SOLN
20.0000 mg | Freq: Every day | INTRAMUSCULAR | Status: DC
  Administered 2021-12-09: 20 mg via INTRAVENOUS
  Filled 2021-12-08: qty 2

## 2021-12-08 MED ORDER — SODIUM CHLORIDE 0.9% FLUSH: 3.0000 mL | INTRAVENOUS | Status: DC | PRN

## 2021-12-08 MED ORDER — INSULIN ASPART 100 UNIT/ML IJ SOLN
0.0000 [IU] | Freq: Three times a day (TID) | INTRAMUSCULAR | Status: DC
  Administered 2021-12-08: 2 [IU] via SUBCUTANEOUS

## 2021-12-08 MED ORDER — ~~LOC~~ CARDIAC SURGERY, PATIENT & FAMILY EDUCATION: Freq: Once | Status: AC

## 2021-12-08 MED ORDER — SODIUM CHLORIDE 0.9 % IV SOLN: 250.0000 mL | INTRAVENOUS | Status: DC | PRN

## 2021-12-08 MED ORDER — MAGNESIUM HYDROXIDE 400 MG/5ML PO SUSP: 30.0000 mL | Freq: Every day | ORAL | Status: DC | PRN

## 2021-12-08 NOTE — Progress Notes (Signed)
Patient arrived at the unit,pacer attached,VVI paced,CCMD notified,CHG bath given,Vitals checked.No complains of pain.Pt oriented to the unit.Care continues

## 2021-12-08 NOTE — Progress Notes (Signed)
2 Days Post-Op Procedure(s) (LRB): CORONARY ARTERY BYPASS GRAFTING (CABG) X 3 BYPASSES USING LEFT INTERNAL MAMMARY ARTERY AND RIGHT LEG GREATER SAPHENOUS VEIN HARVESTED ENDOSCOPICALLY (N/A) TRANSESOPHAGEAL ECHOCARDIOGRAM (TEE) (N/A) Subjective: Having no pain this morning Slept fairly well Minimal chest tube output Today's chest x-ray and labs reviewed  Objective: Vital signs in last 24 hours: Temp:  [97.8 F (36.6 C)-98.7 F (37.1 C)] 98.7 F (37.1 C) (12/03 1124) Pulse Rate:  [73-91] 80 (12/03 1000) Cardiac Rhythm: Normal sinus rhythm (12/03 0800) Resp:  [10-28] 20 (12/03 1000) BP: (116-148)/(65-105) 135/92 (12/03 1000) SpO2:  [84 %-97 %] 91 % (12/03 1000) Arterial Line BP: (80-156)/(48-72) 137/57 (12/02 1600)  Hemodynamic parameters for last 24 hours:    Intake/Output from previous day: 12/02 0701 - 12/03 0700 In: 939.9 [P.O.:480; I.V.:258; IV Piggyback:202] Out: 5681 [EXNTZ:0017; Chest Tube:170] Intake/Output this shift: Total I/O In: 164.5 [I.V.:64.5; IV Piggyback:100] Out: 445 [Urine:445]       Exam    General- alert and comfortable    Neck- no JVD, no cervical adenopathy palpable, no carotid bruit   Lungs- clear without rales, wheezes   Cor- regular rate and rhythm, no murmur , gallop   Abdomen- soft, non-tender   Extremities - warm, non-tender, minimal edema   Neuro- oriented, appropriate, no focal weakness   Lab Results: Recent Labs    12/07/21 1730 12/08/21 0439  WBC 16.4* 16.9*  HGB 15.3 14.4  HCT 46.7 43.5  PLT 260 261   BMET:  Recent Labs    12/07/21 1730 12/08/21 0439  NA 136 136  K 4.2 4.3  CL 102 99  CO2 26 28  GLUCOSE 141* 115*  BUN 21 22  CREATININE 1.03 0.90  CALCIUM 8.4* 8.5*    PT/INR:  Recent Labs    12/06/21 1319  LABPROT 16.3*  INR 1.3*   ABG    Component Value Date/Time   PHART 7.322 (L) 12/06/2021 1923   HCO3 19.5 (L) 12/06/2021 1923   TCO2 21 (L) 12/06/2021 1923   ACIDBASEDEF 6.0 (H) 12/06/2021 1923   O2SAT  95 12/06/2021 1923   CBG (last 3)  Recent Labs    12/08/21 0357 12/08/21 0807 12/08/21 1126  GLUCAP 125* 166* 126*    Assessment/Plan: S/P Procedure(s) (LRB): CORONARY ARTERY BYPASS GRAFTING (CABG) X 3 BYPASSES USING LEFT INTERNAL MAMMARY ARTERY AND RIGHT LEG GREATER SAPHENOUS VEIN HARVESTED ENDOSCOPICALLY (N/A) TRANSESOPHAGEAL ECHOCARDIOGRAM (TEE) (N/A) d/c tubes/lines Plan for transfer to step-down: see transfer orders Continue twice daily diltiazem as patient has allergy to beta-blocker  LOS: 11 days    Dahlia Byes 12/08/2021

## 2021-12-08 NOTE — Progress Notes (Signed)
CT surgery PM rounds  Feeling well Eating dinner Sinus rhythm Nasal cannula 2 L 94% -500cc balance this shift  Blood pressure 119/70, pulse 72, temperature 98.4 F (36.9 C), temperature source Oral, resp. rate (!) 21, height '5\' 8"'$  (1.727 m), weight 102.3 kg, SpO2 93 %.

## 2021-12-09 ENCOUNTER — Inpatient Hospital Stay (HOSPITAL_COMMUNITY): Payer: Medicare Other

## 2021-12-09 ENCOUNTER — Encounter (HOSPITAL_COMMUNITY): Payer: Self-pay | Admitting: Thoracic Surgery (Cardiothoracic Vascular Surgery)

## 2021-12-09 LAB — GLUCOSE, CAPILLARY
Glucose-Capillary: 111 mg/dL — ABNORMAL HIGH (ref 70–99)
Glucose-Capillary: 113 mg/dL — ABNORMAL HIGH (ref 70–99)
Glucose-Capillary: 126 mg/dL — ABNORMAL HIGH (ref 70–99)
Glucose-Capillary: 131 mg/dL — ABNORMAL HIGH (ref 70–99)
Glucose-Capillary: 179 mg/dL — ABNORMAL HIGH (ref 70–99)

## 2021-12-09 LAB — CBC
HCT: 41 % (ref 39.0–52.0)
Hemoglobin: 13.6 g/dL (ref 13.0–17.0)
MCH: 29.5 pg (ref 26.0–34.0)
MCHC: 33.2 g/dL (ref 30.0–36.0)
MCV: 88.9 fL (ref 80.0–100.0)
Platelets: 274 10*3/uL (ref 150–400)
RBC: 4.61 MIL/uL (ref 4.22–5.81)
RDW: 13.8 % (ref 11.5–15.5)
WBC: 18 10*3/uL — ABNORMAL HIGH (ref 4.0–10.5)
nRBC: 0 % (ref 0.0–0.2)

## 2021-12-09 LAB — BASIC METABOLIC PANEL
Anion gap: 9 (ref 5–15)
BUN: 28 mg/dL — ABNORMAL HIGH (ref 8–23)
CO2: 25 mmol/L (ref 22–32)
Calcium: 8.2 mg/dL — ABNORMAL LOW (ref 8.9–10.3)
Chloride: 99 mmol/L (ref 98–111)
Creatinine, Ser: 1.06 mg/dL (ref 0.61–1.24)
GFR, Estimated: >60 mL/min (ref >60)
Glucose, Bld: 146 mg/dL — ABNORMAL HIGH (ref 70–99)
Potassium: 4.2 mmol/L (ref 3.5–5.1)
Sodium: 133 mmol/L — ABNORMAL LOW (ref 135–145)

## 2021-12-09 NOTE — Progress Notes (Addendum)
      [address]       [place] [address]             [phone]      3 Days Post-Op Procedure(s) (LRB): CORONARY ARTERY BYPASS GRAFTING (CABG) X 3 BYPASSES USING LEFT INTERNAL MAMMARY ARTERY AND RIGHT LEG GREATER SAPHENOUS VEIN HARVESTED ENDOSCOPICALLY (N/A) TRANSESOPHAGEAL ECHOCARDIOGRAM (TEE) (N/A)  Subjective:  Patient states he can't get comfortable.  Used CPAP last night.  + ambulation in ICU.  Passing gas no BM yet  Objective: Vital signs in last 24 hours: Temp:  [98.4 F (36.9 C)-98.7 F (37.1 C)] 98.4 F (36.9 C) (12/04 0355) Pulse Rate:  [67-83] 68 (12/04 0355) Cardiac Rhythm: Normal sinus rhythm;Atrial paced;Other (Comment) (12/03 2234) Resp:  [12-24] 15 (12/04 0355) BP: (115-148)/(48-92) 117/48 (12/04 0355) SpO2:  [91 %-94 %] 94 % (12/04 0355) Weight:  [101.1 kg] 101.1 kg (12/04 0500)  Intake/Output from previous day: 12/03 0701 - 12/04 0700 In: 413.2 [P.O.:240; I.V.:73.2; IV Piggyback:100] Out: 1030 [Urine:990; Chest Tube:40]  General appearance: alert, cooperative, and no distress Heart: regular rate and rhythm Lungs: diminished breath sounds bibasilar Abdomen: soft, non-tender; bowel sounds normal; no masses,  no organomegaly Extremities: edema trace Wound: clean and dry  Lab Results: Recent Labs    12/08/21 0439 12/09/21 0148  WBC 16.9* 18.0*  HGB 14.4 13.6  HCT 43.5 41.0  PLT 261 274   BMET:  Recent Labs    12/08/21 0439 12/09/21 0148  NA 136 133*  K 4.3 4.2  CL 99 99  CO2 28 25  GLUCOSE 115* 146*  BUN 22 28*  CREATININE 0.90 1.06  CALCIUM 8.5* 8.2*    PT/INR:  Recent Labs    12/06/21 1319  LABPROT 16.3*  INR 1.3*   ABG    Component Value Date/Time   PHART 7.322 (L) 12/06/2021 1923   HCO3 19.5 (L) 12/06/2021 1923   TCO2 21 (L) 12/06/2021 1923   ACIDBASEDEF 6.0 (H) 12/06/2021 1923   O2SAT 95 12/06/2021 1923   CBG (last 3)  Recent Labs    12/08/21 2125 12/09/21 0016 12/09/21 0359  GLUCAP 141*  126* 131*    Assessment/Plan: S/P Procedure(s) (LRB): CORONARY ARTERY BYPASS GRAFTING (CABG) X 3 BYPASSES USING LEFT INTERNAL MAMMARY ARTERY AND RIGHT LEG GREATER SAPHENOUS VEIN HARVESTED ENDOSCOPICALLY (N/A) TRANSESOPHAGEAL ECHOCARDIOGRAM (TEE) (N/A)  CV- NSR, BP stable- on home Cardizem Pulm- weaning oxygen as tolerated, CXR with atelectasis, elevated right diaphragm-- continue nightly CPAP, aggressive pulm toilet Renal- creatinine at 1.06, stop Toradol, continue Lasix, potassium CBGs controlled- stop SSIP Dispo- patient stable, on home Cardizem for BP control, will turn off pacing, continue aggressive pulmonary toilet   LOS: 12 days    Ellwood Handler, PA-C 12/09/2021 Patient seen and examined, agree with above Right hemidiaphragm elevated without a clear source.  Interestingly says he felt like he could breathe better after Swan/ sheath removed.  Likely will recover with time Was not using CPAP at home- will try again tonight- if not tolerated will dc  Remo Lipps C. Roxan Hockey, MD [facility] [phone]

## 2021-12-09 NOTE — Progress Notes (Signed)
CARDIAC REHAB PHASE I   PRE:  Rate/Rhythm: 80 SR  BP:  Sitting: 133/71      SaO2: 96 RA   MODE:  Ambulation: 470 ft   POST:  Rate/Rhythm: 88 SR  BP:  Sitting: 141/80      SaO2: 96 RA   Pt ambulated in hall on RA using front wheel walker. Minimal assist to stand with standby assist in hall. Tolerated well with no pain or sob. Returned to chair with call bell and bedside table in reach. Encouraged 2 more walks and IS use today. Will continue to follow.   [id number]  Vanessa Barbara, RN BSN 12/09/2021 10:41 AM

## 2021-12-09 NOTE — Progress Notes (Signed)
Cardiology Office Note    Date:  12/20/2021   ID:  Keith Murillo, DOB 03-21-1950, MRN [id number]  PCP:  [name], FNP  Cardiologist:  Ida Rogue, MD  Electrophysiologist:  None   Chief Complaint: Hospital follow-up  History of Present Illness:   Keith Murillo is a 71 y.o. male with history of CAD with prior PCI to the LAD and RCA in 2004 and 2009 with NSTEMI in 11/2021 status post three-vessel CABG on 12/06/2021, HTN, HLD intolerant to statins secondary to myalgias, hypothyroidism, obesity, sleep apnea, prostate cancer who presents for hospital follow-up as outlined below.  He was admitted with an NSTEMI in 2004 and underwent RCA stenting.  LHC in 2009 with PCI to the LAD.  Notes indicate he has been off statin since 2010.  He was admitted to the hospital in 2020 with Enterococcus bacteremia with TEE showing no evidence of endocarditis with a preserved LV systolic function.  Lexiscan MPI in 07/2018 showed a small defect of mild severity present in the mid inferolateral and apical inferior location consistent with a small area of apical inferolateral ischemia with a preserved LV systolic function.  Study was overall low risk.  He was admitted to [facility] on 11/27/2021 through 12/11/2021 with an NSTEMI with stuttering sudden onset of left-sided chest tightness radiating to his left arm that felt similar to his prior angina.  High-sensitivity troponin peaked at 4207.  Echo showed an EF of 60 to 65%, no regional wall motion abnormalities, severe concentric LVH, grade 1 diastolic dysfunction, normal RV systolic function and ventricular cavity size, mildly dilated left atrium, trivial mitral regurgitation, and mild dilatation of the aortic root measuring 40 mm.  LHC on 11/29/2021 showed severe multivessel CAD as outlined below.  Patient was subsequently evaluated by CVTS with recommendation to proceed with CABG following Brilinta washout.  Initially, 7-day washout  period was recommended, though note indicates he chose to proceed with surgery after a 4-day washout.  He underwent three-vessel CABG on 12/06/2021 with LIMA to OM1 and OM2 sequentially and SVG to the distal LAD.  Postoperative course was noted for significant pain and hypertension managed with oral diltiazem due to beta-blocker allergy.  He remained in sinus rhythm.  He comes in accompanied by his wife today and is doing well from a cardiac perspective, without symptoms of angina or decompensation.  No dyspnea, dizziness, presyncope, or syncope.  No lower extremity swelling, progressive orthopnea, or early satiety.  He is remaining active, at times his wife feels like he is too active.  Tolerating current cardiac medications without issues.  He has not needed any as needed furosemide.  His weight is down 10 pounds by our scale when compared to his last visit in our office in 07/2021.  They prefer to avoid medications when possible and would like to focus on lifestyle modification as well as with assistance through their integrative medicine physician.  No falls or symptoms concerning for bleeding.   Labs independently reviewed: 12/2021 - potassium 4.2, BUN 28, serum creatinine 1.06, Hgb 13.6, PLT 274, magnesium 2.3 11/2021 - TC 179, TG 322, HDL 33, LDL 82, TSH normal, LP(a) 58.2, A1c 5.7 01/2021 - albumin 3.7, AST 41, ALT normal  Past Medical History:  Diagnosis Date   Cancer [facility])    prostate   Chest pain    Complication of anesthesia    fighting with waking up   Coronary artery disease    [place])  Hypercholesterolemia    Hyperlipidemia    Hypertension    Ischemic heart disease September 2004   Known--with non-Q-wave myocardial infarction    Obesity    Sleep apnea     Past Surgical History:  Procedure Laterality Date   CARDIAC CATHETERIZATION  11/2007   CORONARY ARTERY BYPASS GRAFT N/A 12/06/2021   Procedure: CORONARY ARTERY BYPASS GRAFTING (CABG) X 3 BYPASSES USING LEFT  INTERNAL MAMMARY ARTERY AND RIGHT LEG GREATER SAPHENOUS VEIN HARVESTED ENDOSCOPICALLY;  Surgeon: [name], MD;  Location: [place];  Service: Open Heart Surgery;  Laterality: N/A;   INGUINAL HERNIA REPAIR Right 09/22/2019   Procedure: RIGHT INGUINAL HERNIA WITH MESH;  Surgeon: Erroll Luna, MD;  Location: [place];  Service: General;  Laterality: Right;  TAP BLOCK   LEFT HEART CATH AND CORONARY ANGIOGRAPHY N/A 11/29/2021   Procedure: LEFT HEART CATH AND CORONARY ANGIOGRAPHY;  Surgeon: Leonie Man, MD;  Location: [place] [facility];  Service: Cardiovascular;  Laterality: N/A;   TEE WITHOUT CARDIOVERSION N/A 03/16/2018   Procedure: TRANSESOPHAGEAL ECHOCARDIOGRAM (TEE);  Surgeon: Acie Fredrickson Wonda Cheng, MD;  Location: [place];  Service: Cardiovascular;  Laterality: N/A;   TEE WITHOUT CARDIOVERSION N/A 12/06/2021   Procedure: TRANSESOPHAGEAL ECHOCARDIOGRAM (TEE);  Surgeon: [name], MD;  Location: [place];  Service: Open Heart Surgery;  Laterality: N/A;    Current Medications: Current Meds  Medication Sig   acetaminophen (TYLENOL) 500 MG tablet Take 2 tablets (1,000 mg total) by mouth every 6 (six) hours as needed.   aspirin EC 81 MG tablet Take 1 tablet (81 mg total) by mouth daily.   Cholecalciferol (VITAMIN D-3) 125 MCG (5000 UT) TABS Take 5,000 Units by mouth daily.    clopidogrel (PLAVIX) 75 MG tablet Take 1 tablet (75 mg total) by mouth daily.   Coenzyme Q10 ([employer] COQ-10) 200 MG capsule Take 200 mg by mouth daily.   DHEA 25 MG CAPS Take 25 mg by mouth daily.    diltiazem (CARDIZEM SR) 120 MG 12 hr capsule Take 1 capsule (120 mg total) by mouth every 12 (twelve) hours.   ezetimibe (ZETIA) 10 MG tablet Take 1 tablet (10 mg total) by mouth daily.   furosemide (LASIX) 20 MG tablet Take 1 tablet (20 mg total) by mouth daily as needed. For weight gain of 3 lbs in 24 hours   Magnesium Citrate POWD Take 1 Scoop by mouth daily as needed (regularity).   melatonin 5 MG TABS Take 5  mg by mouth at bedtime.   Menaquinone-7 (VITAMIN K2 PO) Take 150 mcg by mouth daily. MK-7   Multiple Vitamin (MULTIVITAMIN WITH MINERALS) TABS tablet Take 1 tablet by mouth daily. Men's Multivitamin by Purity   Multiple Vitamins-Minerals (IMMUNE SUPPORT PO) Take 520 mg by mouth daily. Vitac-ld Liposomal Vitamin   NON FORMULARY Take 1 capsule by mouth daily. HAIOTB Circulation Syn3rgy (Beet Root, L-Arginine, Horse Chestnut)   NP THYROID 60 MG tablet Take 60 mg by mouth every morning.   OVER THE COUNTER MEDICATION Take 2 capsules by mouth daily. [place]cadine grape capsules - 650 mg each - take 2 capsules by mouth daily   potassium chloride (KLOR-CON M) 10 MEQ tablet Take 1 tablet (10 mEq total) by mouth daily as needed. Take when you take a Lasix (Furosemide)   Pregnenolone Micronized (PREGNENOLONE PO) Take 75 mg by mouth daily.   Probiotic Product (PROBIOTIC PO) Take 1 capsule by mouth daily. 20 billion   QUERCETIN PO Take 1 capsule by mouth daily.  Bio-Quercetin   sildenafil (REVATIO) 20 MG tablet take 2 to 5 tablets by mouth 1 hour prior to intercourse   traMADol (ULTRAM) 50 MG tablet Take 1 tablet (50 mg total) by mouth every 4 (four) hours as needed for moderate pain.   Turmeric 500 MG TABS Take 500 mg by mouth daily.   Zinc 50 MG TABS Take 50 mg by mouth daily.    Allergies:   Ace inhibitors, Beta adrenergic blockers, Codeine, Lisinopril, and Statins   Social History   Socioeconomic History   Marital status: Married    Spouse name: Not on file   Number of children: Not on file   Years of education: Not on file   Highest education level: Not on file  Occupational History   Not on file  Tobacco Use   Smoking status: Never   Smokeless tobacco: Never  Vaping Use   Vaping Use: Never used  Substance and Sexual Activity   Alcohol use: Yes    Comment: occas   Drug use: No   Sexual activity: Yes  Other Topics Concern   Not on file  Social History Narrative   Not on file   Social  Determinants of Health   Financial Resource Strain: Not on file  Food Insecurity: No Food Insecurity (11/28/2021)   Hunger Vital Sign    Worried About Running Out of Food in the Last Year: Never true    Ran Out of Food in the Last Year: Never true  Transportation Needs: No Transportation Needs (11/28/2021)   PRAPARE - Hydrologist (Medical): No    Lack of Transportation (Non-Medical): No  Physical Activity: Not on file  Stress: Not on file  Social Connections: Not on file     Family History:  The patient's family history includes Cancer in his maternal grandmother and mother; Hearing loss in his father; Heart attack in his paternal grandfather; Parkinson's disease in his maternal grandfather; Stroke in his maternal grandfather and paternal grandmother.  ROS:   12-point review of systems is negative unless otherwise noted in the HPI.   EKGs/Labs/Other Studies Reviewed:    Studies reviewed were summarized above. The additional studies were reviewed today:  Intraoperative TEE 12/06/2021: POST-OP IMPRESSIONS  _ Left Ventricle: has mildly reduced systolic function, with an ejection  fraction of 50%. The cavity size was normal. The wall motion is normal.  _ Right Ventricle: The right ventricle appears unchanged from pre-bypass.  _ Aorta: The aorta appears unchanged from pre-bypass.  _ Left Atrial Appendage: The left atrial appendage appears unchanged from  pre-bypass.  _ Aortic Valve: The aortic valve appears unchanged from pre-bypass.  _ Mitral Valve: The mitral valve appears unchanged from pre-bypass. There  is  mild regurgitation.  _ Tricuspid Valve: There is mild regurgitation.  _ Pulmonic Valve: The pulmonic valve appears unchanged from pre-bypass.  _ Interatrial Septum: The interatrial septum appears unchanged from  pre-bypass.  _ Pericardium: The pericardium appears unchanged from pre-bypass.  _ Comments: Post-bypass TEE images reviewed with  surgeon.  __________  Pre-CABG peripheral artery ultrasound 12/03/2021: Summary:  Right Carotid: Velocities in the right ICA are consistent with a 1-39%  stenosis.   Left Carotid: Velocities in the left ICA are consistent with a 1-39%  stenosis.  Vertebrals: Bilateral vertebral arteries demonstrate antegrade flow.  Subclavians: Normal flow hemodynamics were seen in bilateral subclavian arteries.   Right Upper Extremity: Doppler waveforms remain within normal limits with  right radial compression. Doppler  waveforms remain within normal limits  with right ulnar compression.  Left Upper Extremity: Doppler waveforms remain within normal limits with  left radial compression. Doppler waveforms remain within normal limits  with left ulnar compression.  __________  LHC 11/29/2021:   Prox RCA (distal edge of stent) is 90% stenosed. Prox RCA to Dist RCA lesion is 100% stenosed.   Dist LM to Ost LAD lesion is 55% stenosed.   Prox LAD to Mid LAD stent is 5% stenosed. 1st Sept lesion is 95% stenosed.   Mid LAD to Dist LAD stent is 5% stenosed.   Dist LAD-1 lesion is 80% stenosed. Dist LAD-2 lesion is 95% stenosed.   Ost Cx to Prox Cx lesion is 55% stenosed. Prox Cx lesion is 70% stenosed. 1st Mrg lesion is 70% stenosed.   Ostial LPAV lesion is 99% stenosed.  Heavily calcified focal lesion.   Balloon angioplasty was attempted using a BALLN EMERGE MR 2.0X8.  Unable to cross with wire.  Therefore PTC aborted.  Post intervention, there is a 99% residual stenosis.   There is mild to moderate left ventricular systolic dysfunction. The left ventricular ejection fraction is 35-45% by visual estimate.   LV end diastolic pressure is mildly elevated. - 15 mmHg   There is no aortic valve stenosis.   POST-OPERATIVE DIAGNOSIS:   Severe Multivessel CAD: Proximal RCA stent patent until the distal proximal stent is 100% stenosed with bridging collaterals filling marginal branches and faint left-to-right  collaterals.  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  The superior OM1 branch is smaller with an ostial 60 to 70% stenosis.  The inferior branch is larger with proximal 20% stenosis. Ostial LAD roughly 50% followed by an ectatic segment just prior to SP1 which has eccentric 90% stenosis.  Just after SP1 there is a patent stent with trivial ISR.  There is mild diffuse disease leading up to the more distal stent that is patent but with distal edge 80% lesion followed by diffuse disease and a 95% focal lesion at the apex before the vessel wraps around the apex perfusing the distal third of the PDA territory. Poor imaging on LV gram, however there appears to be mid to apical anterior hypokinesis as well as inferior hypokinesis.  EF estimated 45%.   PLAN OF CARE:  Return to nursing for ongoing care.  Restart IV heparin after TR band removal.  Continue to titrate antianginal medications.  We will discuss with interventional colleagues to determine if other options that are available patient has been transferred to Team C.  Would proceed with 2D echo.. I have added low-dose amlodipine given his hypertension and need for antianginal therapy.  Apparently there was an issue with beta-blockers. __________  2D echo 11/28/2021: 1. Left ventricular ejection fraction, by estimation, is 60 to 65%. The  left ventricle has normal function. The left ventricle has no regional  wall motion abnormalities. There is severe concentric left ventricular  hypertrophy. Left ventricular diastolic   parameters are consistent with Grade I diastolic dysfunction (impaired   relaxation).   2. Right ventricular systolic function is normal. The right ventricular  size is normal. Tricuspid regurgitation signal is  inadequate for assessing  PA pressure.   3. Left atrial size was mildly dilated.   4. The mitral valve is normal in structure. Trivial mitral valve  regurgitation. No evidence of mitral stenosis.   5. The aortic valve is tricuspid. Aortic valve regurgitation is not  visualized. No aortic stenosis is present.   6. Aortic dilatation noted. There is mild dilatation of the aortic root,  measuring 40 mm.  __________  Carlton Adam MPI 07/07/2018: The left ventricular ejection fraction is normal (55-65%). Nuclear stress EF: 56%. No wall motion abnormalities There was no ST segment deviation noted during stress. Defect 1: There is a small defect of mild severity present in the mid inferolateral and apical inferior location. Findings consistent with small area of apical inferolateral ischemia. This is an overall low risk study with small area of ischemia identified in the apical inferolateral region. __________  TEE 03/16/2018: 1. The left ventricle has normal systolic function with an ejection  fraction of 60-65%. The cavity size was normal. Left ventricular diastolic  function could not be evaluated.   2. The right ventricle has normal systolic function. The cavity was  normal. There is no increase in right ventricular wall thickness.   3. Left atrial appendage : No evidence of thrombi.   4. Mitral valve regurgitation is mild to moderate by color flow Doppler.  No mitral valve vegetation visualized.   5. The aortic valve is tricuspid.  __________  2D echo 03/13/2018: 1. The left ventricle has normal systolic function with an ejection  fraction of 60-65%. The cavity size was normal. There is moderately  increased left ventricular wall thickness. Left ventricular diastolic  Doppler parameters are consistent with impaired   relaxation Indeterminent filling  pressures The E/e' is 8-15. No evidence  of left ventricular regional wall motion abnormalities.   2. The right ventricle has normal systolic function. The cavity was  normal. There is no increase in right ventricular wall thickness.   3. Left atrial size was mildly dilated.   4. The mitral valve is degenerative. Mild thickening of the mitral valve  leaflet. Mild calcification of the mitral valve leaflet.   5. The aortic valve was not well visualized.   6. The ascending aorta and aortic root are normal in size and structure.   7. No evidence of left ventricular regional wall motion abnormalities.   8. The interatrial septum was not well visualized.     EKG:  EKG is ordered today.  The EKG ordered today demonstrates NSR, 97 bpm, nonspecific ST-T changes, improved from prior tracing  Recent Labs: 11/28/2021: TSH 3.416 12/06/2021: ALT 40 12/07/2021: Magnesium 2.3 12/09/2021: BUN 28; Creatinine, Ser 1.06; Hemoglobin 13.6; Platelets 274; Potassium 4.2; Sodium 133  Recent Lipid Panel    Component Value Date/Time   CHOL 179 11/28/2021 0244   CHOL 186 07/30/2021 1005   TRIG 322 (H) 11/28/2021 0244   HDL 33 (L) 11/28/2021 0244   HDL 47 07/30/2021 1005   CHOLHDL 5.4 11/28/2021 0244   VLDL 64 (H) 11/28/2021 0244   LDLCALC 82 11/28/2021 0244   LDLCALC 114 (H) 07/30/2021 1005    PHYSICAL EXAM:    VS:  BP 120/82 (BP Location: Left Arm, Patient Position: Sitting, Cuff Size: Normal)   Pulse 97   Ht _0  (1.702 m)   Wt 209 lb 4 oz (94.9 kg)   SpO2 96%   BMI 32.77 kg/m   BMI: Body mass index is 32.77 kg/m.  Physical Exam Vitals reviewed.  Constitutional:      Appearance: He is well-developed.  HENT:     Head: Normocephalic and atraumatic.  Eyes:     General:        Right eye: No discharge.        Left eye: No discharge.  Neck:     Vascular: No JVD.  Cardiovascular:     Rate and Rhythm: Normal rate and regular rhythm.     Pulses:          Posterior tibial pulses are 2+ on the  right side and 2+ on the left side.     Heart sounds: Normal heart sounds, S1 normal and S2 normal. Heart sounds not distant. No midsystolic click and no opening snap. No murmur heard.    No friction rub.     Comments: Well-healing anterior midline surgical incision site without erythema, soft tissue swelling, purulent discharge, or tenderness to palpation. Pulmonary:     Effort: Pulmonary effort is normal. No respiratory distress.     Breath sounds: Normal breath sounds. No decreased breath sounds, wheezing or rales.  Chest:     Chest wall: No tenderness.  Abdominal:     General: There is no distension.  Musculoskeletal:     Cervical back: Normal range of motion.     Right lower leg: No edema.     Left lower leg: No edema.  Skin:    General: Skin is warm and dry.     Nails: There is no clubbing.  Neurological:     Mental Status: He is alert and oriented to person, place, and time.  Psychiatric:        Speech: Speech normal.        Behavior: Behavior normal.        Thought Content: Thought content normal.        Judgment: Judgment normal.     Wt Readings from Last 3 Encounters:  12/20/21 209 lb 4 oz (94.9 kg)  12/11/21 213 lb 11.2 oz (96.9 kg)  08/03/21 219 lb 9.3 oz (99.6 kg)     ASSESSMENT & PLAN:   CAD with prior remote PCI with recent NSTEMI status post three-vessel CABG on 12/06/2021: He is doing well and is without symptoms concerning for angina or decompensation.  Continue aggressive risk factor modification and secondary prevention including DAPT with ASA and clopidogrel along with ezetimibe.  Not on statin secondary to intolerance.  Not on beta-blocker secondary to fatigue.  Recommend participation with cardiac rehab.  Continue restrictions under the direction of CVTS.  Follow-up with cardiothoracic surgery as directed.  HTN: Blood pressure is well-controlled in the office today.  He remains on diltiazem.  HLD: LDL 82 in 11/2021 with ALT normal in 01/2021.  Goal LDL  less than 55.  Intolerant to statins.  Tolerating ezetimibe.  Patient and wife would like to focus on lifestyle modification and assistance with their integrative medicine physician.  Future orders for fasting lipid panel and LFT have been placed.    Disposition: F/u with Dr. Rockey Situ in 2 months.   Medication Adjustments/Labs and Tests Ordered: Current medicines are reviewed at length with the patient today.  Concerns regarding medicines are outlined above. Medication changes, Labs and Tests ordered today are summarized above and listed in the Patient Instructions accessible in Encounters.   Signed, Christell Faith, PA-C 12/20/2021 4:56 PM     [address] [place], [place] [address] [phone]

## 2021-12-09 NOTE — Plan of Care (Signed)
  Problem: Education: Goal: Understanding of CV disease, CV risk reduction, and recovery process will improve Outcome: Progressing Goal: Individualized Educational Video(s) Outcome: Progressing   Problem: Education: Goal: Individualized Educational Video(s) Outcome: Progressing

## 2021-12-09 NOTE — Progress Notes (Signed)
Pt stated he would like to hold off on cpap tonight.

## 2021-12-09 NOTE — Progress Notes (Signed)
Mobility Specialist Progress Note:   12/09/21 1203  Mobility  Activity Ambulated with assistance in room;Ambulated with assistance to bathroom  Level of Assistance Modified independent, requires aide device or extra time  Assistive Device None  Distance Ambulated (ft) 40 ft  Activity Response Tolerated well  Mobility Referral Yes   Pt received in chair asking to go to bathroom. Complaints of some incisional pain. Left in chair with call bell in reach and all needs met.   Keith Murillo Clarabell Matsuoka Mobility Specialist Please contact via [employer] or  Rehab Office at [phone]

## 2021-12-10 MED ORDER — DILTIAZEM HCL ER 60 MG PO CP12
120.0000 mg | ORAL_CAPSULE | Freq: Two times a day (BID) | ORAL | Status: DC
  Administered 2021-12-10 – 2021-12-11 (×3): 120 mg via ORAL
  Filled 2021-12-10 (×3): qty 2

## 2021-12-10 MED ORDER — FUROSEMIDE 20 MG PO TABS
20.0000 mg | ORAL_TABLET | Freq: Every day | ORAL | Status: DC
  Administered 2021-12-10 – 2021-12-11 (×2): 20 mg via ORAL
  Filled 2021-12-10 (×3): qty 1

## 2021-12-10 MED ORDER — POTASSIUM CHLORIDE CRYS ER 10 MEQ PO TBCR
10.0000 meq | EXTENDED_RELEASE_TABLET | Freq: Every day | ORAL | Status: DC
  Administered 2021-12-10 – 2021-12-11 (×2): 10 meq via ORAL
  Filled 2021-12-10 (×2): qty 1

## 2021-12-10 NOTE — Discharge Summary (Signed)
[address]       [place] [address]             [phone]    Physician Discharge Summary  Patient ID: Keith Murillo MRN: [id number] DOB/AGE: [birth year] 71 y.o.  Admit date: 11/27/2021 Discharge date: 12/11/2021  Admission Diagnoses:  Patient Active Problem List   Diagnosis Date Noted   NSTEMI (non-ST elevated myocardial infarction) ([place]) 11/27/2021   Diarrhea 01/09/2021   Aortic atherosclerosis ([place]) 01/09/2021   Abnormal CBC 01/09/2021   Hypokalemia 01/09/2021   History of pneumonia 01/09/2021   Benign prostatic hyperplasia with lower urinary tract symptoms 08/31/2019   Erectile dysfunction due to arterial insufficiency 07/30/2018   Sepsis ([place]) 03/12/2018   Right-sided chest wall pain 03/12/2018   Prostate cancer ([place]) 08/03/2017   Chronic venous insufficiency 07/20/2017   Lymphedema 07/20/2017   Hyperlipidemia 07/17/2017   Varicose veins of both lower extremities with complications 93/71/6967   Coronary artery disease involving native heart without angina pectoris 01/30/2017   Hypogonadism in male 12/05/2016   Discharge Diagnoses:  Patient Active Problem List   Diagnosis Date Noted   Hx of CABG 12/06/2021   S/P CABG x 3 12/06/2021   NSTEMI (non-ST elevated myocardial infarction) ([place]) 11/27/2021   Diarrhea 01/09/2021   Aortic atherosclerosis ([place]) 01/09/2021   Abnormal CBC 01/09/2021   Hypokalemia 01/09/2021   History of pneumonia 01/09/2021   Benign prostatic hyperplasia with lower urinary tract symptoms 08/31/2019   Erectile dysfunction due to arterial insufficiency 07/30/2018   Sepsis ([place]) 03/12/2018   Right-sided chest wall pain 03/12/2018   Prostate cancer ([place]) 08/03/2017   Chronic venous insufficiency 07/20/2017   Lymphedema 07/20/2017   Hyperlipidemia 07/17/2017   Varicose veins of both lower extremities with complications 89/38/1017   Coronary artery disease involving native heart without angina pectoris 01/30/2017    Hypogonadism in male 12/05/2016   Discharged Condition: good  Requests for follow-up appointments sent on 12/08/2021  Referring cardiologist:  Glenetta Hew, MD  PZW:CHENIDP, Dalbert Batman, FNP  HPI:  Presented with chest pain.   Keith Murillo is a 71 year old man with a past medical history significant for CAD, MI, stents to LAD and RCA (2004 in 2009), hypertension, hyperlipidemia, hypothyroidism, obesity, sleep apnea, prostate cancer.   He presented last week with sudden onset of left-sided chest tightness radiating to his left arm.  He had an episode the evening before admission and took a baby aspirin.  His pain resolved.  Then he had another episode the following day which led to him seeking medical care.  The symptoms were similar to the symptoms he had prior to his previous stents.  In retrospect he been having milder episodes of this over the past 6 months or so.   His troponin was elevated initially at 397 and peaked at 4207.  He was admitted and started on intravenous heparin.  He underwent cardiac catheterization on 11/29/2021 which showed severe three-vessel disease.  An attempt was made to angioplasty a large bifurcating OM branch, but was unsuccessful.     He has been pain-free since admission.  He was on Brilinta and received his last dose on the evening of Sunday, 12/01/2021.  Dr. Roxan Hockey reviewed the patient's chart, labs and diagnostic studies and determined surgical revascularization would provide this patient the best long term treatment. Dr. Roxan Hockey reviewed the patient's treatment options as well as the risks and benefits of surgery. Keith Murillo was agreeable to proceed with coronary artery  bypass grafting surgery.  Hospital Course: Keith Murillo was an inpatient at [facility] since 11/28/21. He remained chest pain free while in the hospital. He was on Brillinta, his last dose was on 12/01/21. A 7 day washout period was recommended but he chose to  proceed with surgery after a 4 day washout period. He was brought to the operating room on 12/06/21. He underwent a CABG x 3 utilizing LIMA to OM1 and OM2 sequentially and SVG to distal LAD. He tolerated the procedure well and was transferred to the SICU in stable condition.  He was weaned from the ventilator and extubated.  He had significant postoperative pain not well controlled with the IV fentanyl so he was started on IV Toradol on postop day 1.  Postoperative hypertension was managed with oral diltiazem due to his beta-blocker allergy.  By postop day 2, the chest tube drainage had subsided so the drainage tubes and monitoring lines were all removed.  Diltiazem continued for management of hypertension.  He was mobilized while in the ICU.  He was ready for transfer to Progressive [facility].  The patient continued to make progress.  He remains in NSR.  His blood pressure was elevated and his Cardizem dose was increased.  His pacing wires were removed without difficulty.  He developed diarrhea from stool softeners.  The patient is ambulating independently.  His surgical incisions are healing without evidence of infection.  He is medically stable for discharge home today.  Consults: None  Significant Diagnostic Studies: angiography:      Prox RCA (distal edge of stent) is 90% stenosed. Prox RCA to Dist RCA lesion is 100% stenosed.   Dist LM to Ost LAD lesion is 55% stenosed.   Prox LAD to Mid LAD stent is 5% stenosed. 1st Sept lesion is 95% stenosed.   Mid LAD to Dist LAD stent is 5% stenosed.   Dist LAD-1 lesion is 80% stenosed. Dist LAD-2 lesion is 95% stenosed.   Ost Cx to Prox Cx lesion is 55% stenosed. Prox Cx lesion is 70% stenosed. 1st Mrg lesion is 70% stenosed.   Ostial LPAV lesion is 99% stenosed.  Heavily calcified focal lesion.   Balloon angioplasty was attempted using a BALLN EMERGE MR 2.0X8.  Unable to cross with wire.  Therefore PTC aborted.  Post intervention, there is a 99% residual  stenosis.   There is mild to moderate left ventricular systolic dysfunction. The left ventricular ejection fraction is 35-45% by visual estimate.   LV end diastolic pressure is mildly elevated. - 15 mmHg   There is no aortic valve stenosis.   POST-OPERATIVE DIAGNOSIS:   Severe Multivessel CAD: Proximal RCA stent patent until the distal proximal stent is 100% stenosed with bridging collaterals filling marginal branches and faint left-to-right collaterals.  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  The superior OM1 branch is smaller with an ostial 60 to 70% stenosis.  The inferior branch is larger with proximal 20% stenosis. Ostial LAD roughly 50% followed by an ectatic segment just prior to SP1 which has eccentric 90% stenosis.  Just after SP1 there is a patent stent with trivial ISR.  There is  mild diffuse disease leading up to the more distal stent that is patent but with distal edge 80% lesion followed by diffuse disease and a 95% focal lesion at the apex before the vessel wraps around the apex perfusing the distal third of the PDA territory. Poor imaging on LV gram, however there appears to be mid to apical anterior hypokinesis as well as inferior hypokinesis.  EF estimated 45%.   PLAN OF CARE:  Return to nursing for ongoing care.  Restart IV heparin after TR band removal.  Continue to titrate antianginal medications.  We will discuss with interventional colleagues to determine if other options that are available patient has been transferred to Team C.  Would proceed with 2D echo.. I have added low-dose amlodipine given his hypertension and  need for antianginal therapy.  Apparently there was an issue with beta-blockers.    Glenetta Hew, MD   Treatments: surgery:    NAMEDAARON, DIMARCO MEDICAL RECORD NO: [id number] ACCOUNT NO: [id number] DATE OF BIRTH: 1950-08-19 FACILITY: [facility] LOCATION: [facility] PHYSICIAN: Revonda Standard. Roxan Hockey, MD   Operative Report    DATE OF PROCEDURE: 12/06/2021   PREOPERATIVE DIAGNOSIS:  Three-vessel coronary artery disease, status post non-ST elevation myocardial infarction.   POSTOPERATIVE DIAGNOSIS:  Three-vessel coronary artery disease, status post non-ST elevation myocardial infarction.   PROCEDURE PERFORMED:   Median sternotomy, extracorporeal circulation,  Coronary artery bypass grafting x 3  Saphenous vein graft to the distal left anterior descending and  Sequential left internal mammary artery to obtuse marginals 1 and 2. Endoscopic vein harvest right thigh   SURGEON:  Remo Lipps C. Roxan Hockey, MD   ASSISTANT:  Enid Cutter, PA       Discharge Exam: Blood pressure 131/79, pulse 80, temperature 98.6 F (37 C), temperature source Oral, resp. rate 20, height _0  (1.727 m), weight 96.9 kg, SpO2 96 %.  General appearance: alert, cooperative, and no distress Heart: regular rate and rhythm Lungs: clear to auscultation bilaterally Abdomen: soft, non-tender; bowel sounds normal; no masses,  no organomegaly Extremities: edema none present Wound: clean and dry   Discharge Medications:  The patient has been discharged on:   1.Beta Blocker:  Yes [   ]                              No   [ x  ]                              If No, reason: Intolerance  2.Ace Inhibitor/ARB: Yes [   ]                                     No  [  x  ]                                     If No, reason: Intolerance   3.Statin:   Yes [   ]                  No  [ x  ]                  If No, reason: Intolerant  4.Shela CommonsVelta Addison  [  Rhunette Croft ]  No   [   ]                  If No,  reason:  Patient had ACS upon admission: yes  Plavix/P2Y12 inhibitor: Yes [ X  ]                                      No  [   ]   Discharge Instructions     Amb Referral to Cardiac Rehabilitation   Complete by: As directed    Diagnosis: CABG   CABG X ___: 3   After initial evaluation and assessments completed: Virtual Based Care may be provided alone or in conjunction with Phase 2 Cardiac Rehab based on patient barriers.: Yes   Intensive Cardiac Rehabilitation (ICR) [place] location only OR Traditional Cardiac Rehabilitation (TCR) *If criteria for ICR are not met will enroll in TCR [facility] only): Yes      Allergies as of 12/11/2021       Reactions   Ace Inhibitors    cough   Beta Adrenergic Blockers    Fatigue   Codeine Other (See Comments)   Other reaction(s): Unknown   Lisinopril Cough   Statins    myalgias        Medication List     STOP taking these medications    ibuprofen 800 MG tablet Commonly known as: ADVIL       TAKE these medications    acetaminophen 500 MG tablet Commonly known as: TYLENOL Take 2 tablets (1,000 mg total) by mouth every 6 (six) hours as needed.   aspirin EC 81 MG tablet Take 1 tablet (81 mg total) by mouth daily.   clopidogrel 75 MG tablet Commonly known as: PLAVIX Take 1 tablet (75 mg total) by mouth daily.   [employer] CoQ-10 200 MG capsule Generic drug: Coenzyme Q10 Take 200 mg by mouth daily.   DHEA 25 MG Caps Take 25 mg by mouth daily.   diltiazem 120 MG 12 hr capsule Commonly known as: CARDIZEM SR Take 1 capsule (120 mg total) by mouth every 12 (twelve) hours.   ezetimibe 10 MG tablet Commonly known as: ZETIA Take 1 tablet (10 mg total) by mouth daily.   furosemide 20 MG tablet Commonly known as: LASIX Take 1 tablet (20 mg total) by mouth daily as needed. For weight gain of 3 lbs in 24 hours   IMMUNE SUPPORT PO Take 520 mg by mouth daily. Vitac-ld Liposomal Vitamin   loperamide 2 MG tablet Commonly known as: Imodium  A-D Take 1 tablet (2 mg total) by mouth 4 (four) times daily as needed for diarrhea or loose stools.   Magnesium Citrate Powd Take 1 Scoop by mouth daily as needed (regularity).   melatonin 5 MG Tabs Take 5 mg by mouth at bedtime.   multivitamin with minerals Tabs tablet Take 1 tablet by mouth daily. Men's Multivitamin by Purity   NON FORMULARY Take 1 capsule by mouth daily. HAIOTB Circulation Syn3rgy (Beet Root, L-Arginine, Horse Chestnut)   NP Thyroid 60 MG tablet Generic drug: thyroid Take 60 mg by mouth every morning.   OVER THE COUNTER MEDICATION Take 2 capsules by mouth daily. [place] grape capsules - 650 mg each - take 2 capsules by mouth daily   potassium chloride 10 MEQ tablet Commonly known as: KLOR-CON M Take 1 tablet (10 mEq total) by mouth daily as needed.  Take when you take a Lasix (Furosemide)   PREGNENOLONE PO Take 75 mg by mouth daily.   PROBIOTIC PO Take 1 capsule by mouth daily. 20 billion   QUERCETIN PO Take 1 capsule by mouth daily. Bio-Quercetin   sildenafil 20 MG tablet Commonly known as: REVATIO take 2 to 5 tablets by mouth 1 hour prior to intercourse   testosterone cypionate 200 MG/ML injection Commonly known as: DEPOTESTOSTERONE CYPIONATE Inject 1.5 mLs into the skin once a week. Once a week   traMADol 50 MG tablet Commonly known as: ULTRAM Take 1 tablet (50 mg total) by mouth every 4 (four) hours as needed for moderate pain.   Turmeric 500 MG Tabs Take 500 mg by mouth daily.   Vitamin D-3 125 MCG (5000 UT) Tabs Take 5,000 Units by mouth daily.   VITAMIN K2 PO Take 150 mcg by mouth daily. MK-7   Zinc 50 MG Tabs Take 50 mg by mouth daily.        Follow-up Information     [name], MD Follow up on 01/07/2022.   Specialty: Cardiothoracic Surgery Why: Appointment is at 4:00, please get CXR at 3:30 at [facility] located on first floor of our office building Contact information: [address] [place] [address] [phone]                 Signed:  Ellwood Handler, PA-C  12/11/2021, 7:48 AM

## 2021-12-10 NOTE — Progress Notes (Signed)
Pacer wires removed per order

## 2021-12-10 NOTE — Progress Notes (Addendum)
      [address]       [place] [address]             [phone]      4 Days Post-Op Procedure(s) (LRB): CORONARY ARTERY BYPASS GRAFTING (CABG) X 3 BYPASSES USING LEFT INTERNAL MAMMARY ARTERY AND RIGHT LEG GREATER SAPHENOUS VEIN HARVESTED ENDOSCOPICALLY (N/A) TRANSESOPHAGEAL ECHOCARDIOGRAM (TEE) (N/A)  Subjective:  Patient sitting up in chair.  Working on coughing up some sputum.  He had some loose stools overnight.  + ambulation  Objective: Vital signs in last 24 hours: Temp:  [97.8 F (36.6 C)-98.6 F (37 C)] 98 F (36.7 C) (12/05 0735) Pulse Rate:  [70-89] 87 (12/05 0735) Cardiac Rhythm: Normal sinus rhythm (12/04 2016) Resp:  [14-20] 20 (12/05 0735) BP: (126-151)/(70-83) 140/83 (12/05 0735) SpO2:  [92 %-96 %] 94 % (12/05 0735) Weight:  [97.6 kg] 97.6 kg (12/05 0522)  General appearance: alert, cooperative, and no distress Heart: regular rate and rhythm Lungs: clear to auscultation bilaterally Abdomen: soft, non-tender; bowel sounds normal; no masses,  no organomegaly Extremities: edema trace Wound: clean and dry  Lab Results: Recent Labs    12/08/21 0439 12/09/21 0148  WBC 16.9* 18.0*  HGB 14.4 13.6  HCT 43.5 41.0  PLT 261 274   BMET:  Recent Labs    12/08/21 0439 12/09/21 0148  NA 136 133*  K 4.3 4.2  CL 99 99  CO2 28 25  GLUCOSE 115* 146*  BUN 22 28*  CREATININE 0.90 1.06  CALCIUM 8.5* 8.2*    PT/INR: No results for input(s): "LABPROT", "INR" in the last 72 hours. ABG    Component Value Date/Time   PHART 7.322 (L) 12/06/2021 1923   HCO3 19.5 (L) 12/06/2021 1923   TCO2 21 (L) 12/06/2021 1923   ACIDBASEDEF 6.0 (H) 12/06/2021 1923   O2SAT 95 12/06/2021 1923   CBG (last 3)  Recent Labs    12/09/21 0920 12/09/21 1156 12/09/21 1738  GLUCAP 179* 113* 111*    Assessment/Plan: S/P Procedure(s) (LRB): CORONARY ARTERY BYPASS GRAFTING (CABG) X 3 BYPASSES USING LEFT INTERNAL MAMMARY ARTERY AND RIGHT LEG GREATER SAPHENOUS  VEIN HARVESTED ENDOSCOPICALLY (N/A) TRANSESOPHAGEAL ECHOCARDIOGRAM (TEE) (N/A)  CV- NSR, mild HTN- continue home Cardizem, no BB as patient is intolerant Pulm- off oxygen, no acute issues, continue IS Renal- weight is trending down, will transition to oral Lasix, potassium GI- diarrhea, stop all stool softners DIspo- patient stable, will d/c EPW today, if remains clinically stable for d/c in AM   LOS: 13 days    Ellwood Handler, PA-C' 12/10/2021 Patient seen and examined, agree with above Will increase Cardizem to 120 mg daily as BP has been a little high Dc pacing wires Hopefully home in AM  [place] C. Roxan Hockey, MD [facility] [phone]

## 2021-12-10 NOTE — Progress Notes (Signed)
Pt having multiple loose stools overnight does not want any further laxatives or stool softeners.

## 2021-12-10 NOTE — Progress Notes (Signed)
Mobility Specialist Progress Note:   12/10/21 0918  Mobility  Activity Ambulated with assistance in hallway  Level of Assistance Modified independent, requires aide device or extra time  Assistive Device None  Distance Ambulated (ft) 550 ft  Activity Response Tolerated well  $Mobility charge 1 Mobility   Pt received in bed willing to participate in mobility. No complaints of pain. Left in chair with call ell in reach and all needs met.   Keith Murillo Mobility Specialist Please contact via [employer] or  Rehab Office at [phone]

## 2021-12-10 NOTE — Progress Notes (Signed)
CARDIAC REHAB PHASE I    Pt sitting in chair visiting with wife. Reports ambulating several times today independently with no difficulties. Able to walk around well with steady gait no walker needed.  Post [facility] education including site care, restrictions, heart healthy diet, IS use at home, home needs at discharge, risk factors, sternal precautions, exercise guidelines and CRP2 reviewed. All questions and concerns addressed. Will refer to [facility] for CRP2. Pt is hoping for discharge home tomorrow. Will continue to follow.   [phone]  Vanessa Barbara, RN BSN 12/10/2021 2:57 PM

## 2021-12-11 MED ORDER — ASPIRIN 81 MG PO TBEC
81.0000 mg | DELAYED_RELEASE_TABLET | Freq: Every day | ORAL | Status: DC
  Administered 2021-12-11: 81 mg via ORAL
  Filled 2021-12-11: qty 1

## 2021-12-11 MED ORDER — CLOPIDOGREL BISULFATE 75 MG PO TABS
75.0000 mg | ORAL_TABLET | Freq: Every day | ORAL | Status: DC
  Administered 2021-12-11: 75 mg via ORAL
  Filled 2021-12-11: qty 1

## 2021-12-11 MED ORDER — EZETIMIBE 10 MG PO TABS: 10.0000 mg | ORAL_TABLET | Freq: Every day | ORAL | 3 refills | Status: AC

## 2021-12-11 MED ORDER — FUROSEMIDE 20 MG PO TABS: 20.0000 mg | ORAL_TABLET | Freq: Every day | ORAL | 0 refills | Status: DC | PRN

## 2021-12-11 MED ORDER — ASPIRIN 81 MG PO TBEC: 81.0000 mg | DELAYED_RELEASE_TABLET | Freq: Every day | ORAL | Status: AC

## 2021-12-11 MED ORDER — DILTIAZEM HCL ER 120 MG PO CP12: 120.0000 mg | ORAL_CAPSULE | Freq: Two times a day (BID) | ORAL | 3 refills | Status: DC

## 2021-12-11 MED ORDER — TRAMADOL HCL 50 MG PO TABS: 50.0000 mg | ORAL_TABLET | ORAL | 0 refills | Status: DC | PRN

## 2021-12-11 MED ORDER — CLOPIDOGREL BISULFATE 75 MG PO TABS: 75.0000 mg | ORAL_TABLET | Freq: Every day | ORAL | 3 refills | Status: DC

## 2021-12-11 MED ORDER — ASPIRIN 325 MG PO TBEC: 325.0000 mg | DELAYED_RELEASE_TABLET | Freq: Every day | ORAL | Status: DC

## 2021-12-11 MED ORDER — ASPIRIN 81 MG PO CHEW: 324.0000 mg | CHEWABLE_TABLET | Freq: Every day | ORAL | Status: DC

## 2021-12-11 MED ORDER — ACETAMINOPHEN 500 MG PO TABS: 1000.0000 mg | ORAL_TABLET | Freq: Four times a day (QID) | ORAL | 0 refills | Status: DC | PRN

## 2021-12-11 MED ORDER — POTASSIUM CHLORIDE CRYS ER 10 MEQ PO TBCR: 10.0000 meq | EXTENDED_RELEASE_TABLET | Freq: Every day | ORAL | 0 refills | Status: DC | PRN

## 2021-12-11 NOTE — Progress Notes (Addendum)
      [address]       [place] [address]             [phone]      5 Days Post-Op Procedure(s) (LRB): CORONARY ARTERY BYPASS GRAFTING (CABG) X 3 BYPASSES USING LEFT INTERNAL MAMMARY ARTERY AND RIGHT LEG GREATER SAPHENOUS VEIN HARVESTED ENDOSCOPICALLY (N/A) TRANSESOPHAGEAL ECHOCARDIOGRAM (TEE) (N/A)  Subjective:  Patient sitting up in chair.  He has no complaints.  He is looking forward to going home.  + ambulation  + BM  Objective: Vital signs in last 24 hours: Temp:  [98.3 F (36.8 C)-98.6 F (37 C)] 98.6 F (37 C) (12/06 0515) Pulse Rate:  [76-92] 80 (12/06 0515) Cardiac Rhythm: Normal sinus rhythm (12/05 2300) Resp:  [20-22] 20 (12/06 0515) BP: (128-145)/(73-81) 131/79 (12/06 0515) SpO2:  [95 %-99 %] 96 % (12/06 0515) Weight:  [96.9 kg] 96.9 kg (12/06 0515)  General appearance: alert, cooperative, and no distress Heart: regular rate and rhythm Lungs: clear to auscultation bilaterally Abdomen: soft, non-tender; bowel sounds normal; no masses,  no organomegaly Extremities: edema none present Wound: clean and dry  Lab Results: Recent Labs    12/09/21 0148  WBC 18.0*  HGB 13.6  HCT 41.0  PLT 274   BMET:  Recent Labs    12/09/21 0148  NA 133*  K 4.2  CL 99  CO2 25  GLUCOSE 146*  BUN 28*  CREATININE 1.06  CALCIUM 8.2*    PT/INR: No results for input(s): "LABPROT", "INR" in the last 72 hours. ABG    Component Value Date/Time   PHART 7.322 (L) 12/06/2021 1923   HCO3 19.5 (L) 12/06/2021 1923   TCO2 21 (L) 12/06/2021 1923   ACIDBASEDEF 6.0 (H) 12/06/2021 1923   O2SAT 95 12/06/2021 1923   CBG (last 3)  Recent Labs    12/09/21 0920 12/09/21 1156 12/09/21 1738  GLUCAP 179* 113* 111*    Assessment/Plan: S/P Procedure(s) (LRB): CORONARY ARTERY BYPASS GRAFTING (CABG) X 3 BYPASSES USING LEFT INTERNAL MAMMARY ARTERY AND RIGHT LEG GREATER SAPHENOUS VEIN HARVESTED ENDOSCOPICALLY (N/A) TRANSESOPHAGEAL ECHOCARDIOGRAM (TEE)  (N/A)  CV- NSR, BP improved continue Cardizem, will start Plavix for ACS Pulm- no acute issues, off oxygen, continue IS Renal- creatinine has been stable, weight is below baseline, no edema on exam, will change to PRN at discharge Dispo- patient doing well, he has no home needs, will d/c home today   LOS: 14 days    Ellwood Handler, PA-C 12/11/2021  Patient seen and examined, agree with above  Remo Lipps C. Roxan Hockey, MD [facility] [phone]

## 2021-12-11 NOTE — Progress Notes (Signed)
Explained discharge instructions to patient. Reviewed follow up appointment and next medication administration times. Also reviewed education. Patient verbalized having an understanding for instructions given. All belongings are in the patient's possession. Patients wife will be here at 11Am with the patients clothes.

## 2021-12-11 NOTE — Plan of Care (Signed)
  Problem: Education: Goal: Knowledge of General Education information will improve Description: Including pain rating scale, medication(s)/side effects and non-pharmacologic comfort measures Outcome: Adequate for Discharge   Problem: Health Behavior/Discharge Planning: Goal: Ability to manage health-related needs will improve Outcome: Adequate for Discharge   Problem: Clinical Measurements: Goal: Ability to maintain clinical measurements within normal limits will improve Outcome: Adequate for Discharge Goal: Will remain free from infection Outcome: Adequate for Discharge Goal: Diagnostic test results will improve Outcome: Adequate for Discharge Goal: Respiratory complications will improve Outcome: Adequate for Discharge Goal: Cardiovascular complication will be avoided Outcome: Adequate for Discharge   Problem: Activity: Goal: Risk for activity intolerance will decrease Outcome: Adequate for Discharge   Problem: Nutrition: Goal: Adequate nutrition will be maintained Outcome: Adequate for Discharge   Problem: Coping: Goal: Level of anxiety will decrease Outcome: Adequate for Discharge   Problem: Elimination: Goal: Will not experience complications related to bowel motility Outcome: Adequate for Discharge Goal: Will not experience complications related to urinary retention Outcome: Adequate for Discharge   Problem: Pain Managment: Goal: General experience of comfort will improve Outcome: Adequate for Discharge   Problem: Safety: Goal: Ability to remain free from injury will improve Outcome: Adequate for Discharge   Problem: Skin Integrity: Goal: Risk for impaired skin integrity will decrease Outcome: Adequate for Discharge   Problem: Education: Goal: Understanding of cardiac disease, CV risk reduction, and recovery process will improve Outcome: Adequate for Discharge Goal: Individualized Educational Video(s) Outcome: Adequate for Discharge   Problem:  Activity: Goal: Ability to tolerate increased activity will improve Outcome: Adequate for Discharge   Problem: Cardiac: Goal: Ability to achieve and maintain adequate cardiovascular perfusion will improve Outcome: Adequate for Discharge   Problem: Health Behavior/Discharge Planning: Goal: Ability to safely manage health-related needs after discharge will improve Outcome: Adequate for Discharge   Problem: Education: Goal: Understanding of CV disease, CV risk reduction, and recovery process will improve Outcome: Adequate for Discharge Goal: Individualized Educational Video(s) Outcome: Adequate for Discharge   Problem: Activity: Goal: Ability to return to baseline activity level will improve Outcome: Adequate for Discharge   Problem: Cardiovascular: Goal: Ability to achieve and maintain adequate cardiovascular perfusion will improve Outcome: Adequate for Discharge Goal: Vascular access site(s) Level 0-1 will be maintained Outcome: Adequate for Discharge   Problem: Health Behavior/Discharge Planning: Goal: Ability to safely manage health-related needs after discharge will improve Outcome: Adequate for Discharge   Problem: Education: Goal: Will demonstrate proper wound care and an understanding of methods to prevent future damage Outcome: Adequate for Discharge Goal: Knowledge of disease or condition will improve Outcome: Adequate for Discharge Goal: Knowledge of the prescribed therapeutic regimen will improve Outcome: Adequate for Discharge Goal: Individualized Educational Video(s) Outcome: Adequate for Discharge   Problem: Activity: Goal: Risk for activity intolerance will decrease Outcome: Adequate for Discharge   Problem: Cardiac: Goal: Will achieve and/or maintain hemodynamic stability Outcome: Adequate for Discharge   Problem: Clinical Measurements: Goal: Postoperative complications will be avoided or minimized Outcome: Adequate for Discharge   Problem:  Respiratory: Goal: Respiratory status will improve Outcome: Adequate for Discharge   Problem: Skin Integrity: Goal: Wound healing without signs and symptoms of infection Outcome: Adequate for Discharge Goal: Risk for impaired skin integrity will decrease Outcome: Adequate for Discharge   Problem: Urinary Elimination: Goal: Ability to achieve and maintain adequate renal perfusion and functioning will improve Outcome: Adequate for Discharge

## 2021-12-11 NOTE — Care Management Important Message (Signed)
Important Message  Patient Details  Name: SHAUL TRAUTMAN MRN: [id number] Date of Birth: February 27, 1950   Medicare Important Message Given:  Yes     Shelda Altes 12/11/2021, 8:59 AM

## 2021-12-11 NOTE — Progress Notes (Signed)
CARDIAC REHAB PHASE I     Pt sitting in chair feeling well today. Looking forward to going home today. Reviewed home education provided yesterday. All questions and concerns addressed. Referral to [facility] for CRP2 placed.    [id number]  Vanessa Barbara, RN BSN 12/11/2021 9:19 AM

## 2021-12-11 NOTE — Plan of Care (Signed)
  Problem: Health Behavior/Discharge Planning: Goal: Ability to manage health-related needs will improve Outcome: Progressing   Problem: Clinical Measurements: Goal: Will remain free from infection Outcome: Progressing Goal: Diagnostic test results will improve Outcome: Progressing   

## 2021-12-12 ENCOUNTER — Telehealth: Payer: Self-pay

## 2021-12-12 NOTE — Patient Outreach (Signed)
  Care Coordination TOC Note Transition Care Management Unsuccessful Follow-up Telephone Call  Date of discharge and from where:  12/11/21-[place]   Attempts:  1st Attempt  Reason for unsuccessful TCM follow-up call:  No answer/busy    Enzo Montgomery, RN,BSN,CCM [facility] Telephonic Care Management Coordinator Direct Phone: [phone] Toll Free: [phone] Fax: [phone]

## 2021-12-13 ENCOUNTER — Telehealth: Payer: Self-pay

## 2021-12-13 MED FILL — Potassium Chloride Inj 2 mEq/ML: INTRAVENOUS | Qty: 40 | Status: AC

## 2021-12-13 MED FILL — Calcium Chloride Inj 10%: INTRAVENOUS | Qty: 10 | Status: AC

## 2021-12-13 MED FILL — Mannitol IV Soln 20%: INTRAVENOUS | Qty: 500 | Status: AC

## 2021-12-13 MED FILL — Heparin Sodium (Porcine) Inj 1000 Unit/ML: INTRAMUSCULAR | Qty: 40 | Status: AC

## 2021-12-13 MED FILL — Sodium Chloride IV Soln 0.9%: INTRAVENOUS | Qty: 3000 | Status: AC

## 2021-12-13 MED FILL — Lidocaine HCl Local Soln Prefilled Syringe 100 MG/5ML (2%): INTRAMUSCULAR | Qty: 5 | Status: AC

## 2021-12-13 MED FILL — Electrolyte-R (PH 7.4) Solution: INTRAVENOUS | Qty: 4000 | Status: AC

## 2021-12-13 MED FILL — Sodium Bicarbonate IV Soln 8.4%: INTRAVENOUS | Qty: 100 | Status: AC

## 2021-12-13 MED FILL — Heparin Sodium (Porcine) Inj 1000 Unit/ML: Qty: 1000 | Status: AC

## 2021-12-13 MED FILL — Lidocaine HCl Local Preservative Free (PF) Inj 2%: INTRAMUSCULAR | Qty: 14 | Status: AC

## 2021-12-13 NOTE — Patient Outreach (Signed)
  Care Coordination TOC Note Transition Care Management Unsuccessful Follow-up Telephone Call  Date of discharge and from where:  12/11/21-[place]  Attempts:  2nd Attempt  Reason for unsuccessful TCM follow-up call:  Unable to reach patient    Hetty Blend [facility] Telephonic Care Management Coordinator Direct Phone: [phone] Toll Free: [phone] Fax: [phone]

## 2021-12-13 NOTE — Patient Outreach (Signed)
  Care Coordination TOC Note Transition Care Management Follow-up Telephone Call Date of discharge and from where: 12/11/21- Dx: "NSTEMI" How have you been since you were released from the hospital? Patient reports he is doing well. He has not had any pain today. He last took pain med last night prior to going to bed. He is up ambulating and moving around. States appetite good and he has been going to bathroom without any problems. Any questions or concerns? No  Items Reviewed: Did the pt receive and understand the discharge instructions provided? Yes  Medications obtained and verified?  Patient confirms he has all meds and understands them-did not wish to complete med review Other? Yes Any new allergies since your discharge? No  Dietary orders reviewed? Yes Do you have support at home? Yes   Home Care and Equipment/Supplies: Were home health services ordered? not applicable If so, what is the name of the agency? N/A  Has the agency set up a time to come to the patient's home? not applicable Were any new equipment or medical supplies ordered?  No What is the name of the medical supply agency? N/A Were you able to get the supplies/equipment? not applicable Do you have any questions related to the use of the equipment or supplies? No  Functional Questionnaire: (I = Independent and D = Dependent) ADLs: A  Bathing/Dressing- A  Meal Prep- A  Eating- I  Maintaining continence- I  Transferring/Ambulation- I  Managing Meds- I  Follow up appointments reviewed:  PCP Hospital f/u appt confirmed? Yes  Scheduled to see Dr. Charmian Muff on 12/26/21 @ 8:40am. [facility] f/u appt confirmed? Yes  Scheduled to see Laurine Blazer on 12/20/21 @ 2:20pm. Are transportation arrangements needed? No  If their condition worsens, is the pt aware to call PCP or go to the Emergency Dept.? Yes Was the patient provided with contact information for the PCP's office or ED? Yes Was to pt  encouraged to call back with questions or concerns? Yes  SDOH assessments and interventions completed:   Yes   Care Coordination Interventions:  Education Provided    Encounter Outcome:  Pt. Visit Completed    Enzo Montgomery, RN,BSN,CCM [facility] Telephonic Care Management Coordinator Direct Phone: [phone] Toll Free: [phone] Fax: [phone]

## 2021-12-20 ENCOUNTER — Ambulatory Visit: Payer: Medicare Other | Attending: Physician Assistant | Admitting: Physician Assistant

## 2021-12-20 ENCOUNTER — Encounter: Payer: Self-pay | Admitting: Physician Assistant

## 2021-12-20 VITALS — BP 120/82 | HR 97 | Ht 67.0 in | Wt 209.2 lb

## 2021-12-20 DIAGNOSIS — Z951 Presence of aortocoronary bypass graft: Secondary | ICD-10-CM

## 2021-12-20 DIAGNOSIS — I25118 Atherosclerotic heart disease of native coronary artery with other forms of angina pectoris: Secondary | ICD-10-CM

## 2021-12-20 DIAGNOSIS — I10 Essential (primary) hypertension: Secondary | ICD-10-CM

## 2021-12-20 DIAGNOSIS — E785 Hyperlipidemia, unspecified: Secondary | ICD-10-CM

## 2021-12-20 DIAGNOSIS — I214 Non-ST elevation (NSTEMI) myocardial infarction: Secondary | ICD-10-CM | POA: Diagnosis not present

## 2021-12-20 NOTE — Patient Instructions (Addendum)
Medication Instructions:  No changes at this time.  *If you need a refill on your cardiac medications before your next appointment, please call your pharmacy*   Lab Work: Fasting lipid & liver panel to be done when you see your primary care provider. Nothing to eat or drink after midnight except sip of water with your medications.   If you have labs (blood work) drawn today and your tests are completely normal, you will receive your results only by: [place] (if you have MyChart) OR A paper copy in the mail If you have any lab test that is abnormal or we need to change your treatment, we will call you to review the results.   Testing/Procedures: None   Follow-Up: At [facility], you and your health needs are our priority.  As part of our continuing mission to provide you with exceptional heart care, we have created designated Provider Care Teams.  These Care Teams include your primary Cardiologist (physician) and Advanced Practice Providers (APPs -  Physician Assistants and Nurse Practitioners) who all work together to provide you with the care you need, when you need it.   Your next appointment:   2 month(s)  The format for your next appointment:   In Person  Provider:   Ida Rogue, MD       Important Information About [facility]

## 2021-12-24 ENCOUNTER — Telehealth (HOSPITAL_COMMUNITY): Payer: Self-pay

## 2021-12-24 NOTE — Telephone Encounter (Signed)
Called patient to see if he is interested in the Cardiac Rehab Program. Patient expressed interest. Explained scheduling process and went over insurance, patient verbalized understanding. Will contact patient for scheduling once f/u has been completed.  °

## 2021-12-26 ENCOUNTER — Ambulatory Visit (INDEPENDENT_AMBULATORY_CARE_PROVIDER_SITE_OTHER): Payer: Medicare Other | Admitting: Nurse Practitioner

## 2021-12-26 ENCOUNTER — Encounter: Payer: Self-pay | Admitting: Nurse Practitioner

## 2021-12-26 ENCOUNTER — Ambulatory Visit (INDEPENDENT_AMBULATORY_CARE_PROVIDER_SITE_OTHER)
Admission: RE | Admit: 2021-12-26 | Discharge: 2021-12-26 | Disposition: A | Discharge status: Home / Self Care | Payer: Medicare Other | Source: Ambulatory Visit | Attending: Nurse Practitioner | Admitting: Nurse Practitioner

## 2021-12-26 VITALS — BP 118/66 | HR 96 | Temp 98.0°F | Resp 16 | Ht 67.0 in | Wt 211.1 lb

## 2021-12-26 DIAGNOSIS — C61 Malignant neoplasm of prostate: Secondary | ICD-10-CM

## 2021-12-26 DIAGNOSIS — R051 Acute cough: Secondary | ICD-10-CM

## 2021-12-26 DIAGNOSIS — Z951 Presence of aortocoronary bypass graft: Secondary | ICD-10-CM | POA: Diagnosis not present

## 2021-12-26 DIAGNOSIS — N5201 Erectile dysfunction due to arterial insufficiency: Secondary | ICD-10-CM

## 2021-12-26 DIAGNOSIS — Z7689 Persons encountering health services in other specified circumstances: Secondary | ICD-10-CM

## 2021-12-26 DIAGNOSIS — E291 Testicular hypofunction: Secondary | ICD-10-CM | POA: Diagnosis not present

## 2021-12-26 DIAGNOSIS — I214 Non-ST elevation (NSTEMI) myocardial infarction: Secondary | ICD-10-CM

## 2021-12-26 DIAGNOSIS — I83893 Varicose veins of bilateral lower extremities with other complications: Secondary | ICD-10-CM

## 2021-12-26 DIAGNOSIS — G4709 Other insomnia: Secondary | ICD-10-CM | POA: Diagnosis not present

## 2021-12-26 DIAGNOSIS — I25810 Atherosclerosis of coronary artery bypass graft(s) without angina pectoris: Secondary | ICD-10-CM

## 2021-12-26 DIAGNOSIS — R059 Cough, unspecified: Secondary | ICD-10-CM | POA: Diagnosis not present

## 2021-12-26 LAB — CBC
HCT: 43.9 % (ref 39.0–52.0)
Hemoglobin: 14.7 g/dL (ref 13.0–17.0)
MCHC: 33.5 g/dL (ref 30.0–36.0)
MCV: 86.3 fl (ref 78.0–100.0)
Platelets: 499 10*3/uL — ABNORMAL HIGH (ref 150.0–400.0)
RBC: 5.09 Mil/uL (ref 4.22–5.81)
RDW: 13.8 % (ref 11.5–15.5)
WBC: 10.4 10*3/uL (ref 4.0–10.5)

## 2021-12-26 LAB — HEPATIC FUNCTION PANEL
ALT: 14 U/L (ref 0–53)
AST: 14 U/L (ref 0–37)
Albumin: 4 g/dL (ref 3.5–5.2)
Alkaline Phosphatase: 89 U/L (ref 39–117)
Bilirubin, Direct: 0.2 mg/dL (ref 0.0–0.3)
Total Bilirubin: 0.8 mg/dL (ref 0.2–1.2)
Total Protein: 6.4 g/dL (ref 6.0–8.3)

## 2021-12-26 LAB — LIPID PANEL
Cholesterol: 158 mg/dL (ref 0–200)
HDL: 51.3 mg/dL (ref >39.00)
LDL Cholesterol: 84 mg/dL (ref 0–99)
NonHDL: 106.58
Total CHOL/HDL Ratio: 3
Triglycerides: 111 mg/dL (ref 0.0–149.0)
VLDL: 22.2 mg/dL (ref 0.0–40.0)

## 2021-12-26 LAB — TSH: TSH: 1.27 u[IU]/mL (ref 0.35–5.50)

## 2021-12-26 NOTE — Assessment & Plan Note (Signed)
States intermittent cough with coughing up phlegm.  Patient attributes it to surgery which was 3 weeks ago.  Did discuss likely had some irritation from being intubated for an extended period of time but should have recovered by now 3 weeks postop.  Pending chest x-ray.

## 2021-12-26 NOTE — Progress Notes (Signed)
Established Patient Office Visit  Subjective   Patient ID: Keith Murillo, male    DOB: 1950-05-14  Age: 71 y.o. MRN: [id number]  Chief Complaint  Patient presents with   Establish Care    HPI  Transfer of care: Last seen in office by me on 01/09/2021 for an acute visit, then Eliezer Lofts on 08/03/2019   Other Providers Christell Faith, PA Ida Rogue, MD -  cardiology  Modesto Charon, MD CT surgeon   Insomnia: states that he is having trouble sleeping. States that he will get into bed around midnight and goes to sleep to 1-3. Gets up and then will fall asleep. States that since the sugery his day and nights mixed up. States that he is trying melatonin. States that he is sleeping for a few hours and then will get up   States that there has been a change in him in regards to his behavior. States that he is not resting well.  States he will have episodes of sleeping 3 to 4 hours and be up and feels great.  Per his wife report he seems "hyperactive".  She is concerned as this is an acute thing from all the medication he received in the hospital or "delirium".  Patient had a follow-up with cardiology recently.  Patient is also been experiencing a cough 3 weeks post CABG surgery.  Nstemi: in 2004 with stenting, PCI in 2009, 11/2021 [facility] and underwent [facility] x3.  CABG was performed by Erasmo Leventhal cardiothoracic surgeon.  Patient is currently on clopidogrel  Prostate cancer: State that they did a biopisy. States that one sample had cancer.  States that he has seen urology [facility]).  Patient is currently on TRT and is followed through [facility]  Thyroid: Patient currently maintained on NP thyroid also followed by Bruce Lantelme  Hypogonadism: Bruce lantelme ([facility])  OSA: states that he has not used it in years. Because he lost weight and feels like he does not need it.  Patient has not had a repeat sleep study to see if  this is the truth or whether he still having episodes of apnea.  Tdap: Refused Flu: Refused COVID: Refused Shingles vaccine: Refused Pneumonia vaccine: Refused  Colonsocpy: Dr,. Collene Mares 2 years ago and was clean per patient and family members report.    Review of Systems  Constitutional:  Negative for chills and fever.  Respiratory:  Positive for cough.   Cardiovascular:  Negative for chest pain and leg swelling.  Neurological:  Negative for headaches.  Psychiatric/Behavioral:         "+" sleep distrubance      Objective:     BP 118/66   Pulse 96   Temp 98 F (36.7 C)   Resp 16   Ht '5\' 7"'$  (1.702 m)   Wt 211 lb 2 oz (95.8 kg)   SpO2 97%   BMI 33.07 kg/m    Physical Exam Vitals and nursing note reviewed.  Constitutional:      Appearance: Normal appearance.  HENT:     Right Ear: Tympanic membrane, ear canal and external ear normal.     Left Ear: Tympanic membrane, ear canal and external ear normal.     Ears:     Comments: Wears hearing aids    Mouth/Throat:     Mouth: Mucous membranes are moist.     Pharynx: Oropharynx is clear.  Eyes:     Extraocular Movements: Extraocular movements intact.  Pupils: Pupils are equal, round, and reactive to light.  Cardiovascular:     Rate and Rhythm: Normal rate and regular rhythm.     Heart sounds: Normal heart sounds.  Pulmonary:     Effort: Pulmonary effort is normal.     Breath sounds: Normal breath sounds.  Musculoskeletal:     Right lower leg: No edema.     Left lower leg: No edema.     Comments: Large bilateral varicose veins  Lymphadenopathy:     Cervical: No cervical adenopathy.  Neurological:     Mental Status: He is alert.      No results found for any visits on 12/26/21.    The ASCVD Risk score (Arnett DK, et al., 2019) failed to calculate for the following reasons:   The patient has a prior MI or stroke diagnosis    Assessment & Plan:   Problem List Items Addressed This Visit        Cardiovascular and Mediastinum   Coronary artery disease involving native heart without angina pectoris    Recently underwent CABG x 3      Varicose veins of both lower extremities with complications    Patient has large varicosities on the proximal lower leg.  Also has smaller spider veins around the ankles.  Patient would like to see vascular states he does not was see [place] he will report back to me once he finds a provider he would like to see.      Erectile dysfunction due to arterial insufficiency    Patient has been followed by Dr. [name] is on sildenafil as needed.      NSTEMI (non-ST elevated myocardial infarction) ([place])    Most recent NSTEMI led to CABG x 3.  He has seen cardiology since hospital discharge postop 3 weeks.  On clopidogrel.  Continue following with cardiology.  Cardiology wanted lipid and liver panel done today will order for patient convenience and courtesy to cardiology and for results once we have them.        Endocrine   Hypogonadism in male    Followed by provider Bruce lementle through [facility].  Prescribed patient testosterone follows blood work appropriately per the report        Genitourinary   Prostate cancer [facility])    Has been seen by urology in the past.  Did hold TRT for a period of time but has started it back.  States TRT provider is following patient's PSA very closely        Other   Hx of CABG    X 3 did mention that his diaphragm on the right side was elevated and not sure why.  Will repeat chest x-ray today pending result      Relevant Orders   Lipid panel   Hepatic function panel   Acute cough    States intermittent cough with coughing up phlegm.  Patient attributes it to surgery which was 3 weeks ago.  Did discuss likely had some irritation from being intubated for an extended period of time but should have recovered by now 3 weeks postop.  Pending chest x-ray.      Relevant Orders    DG Chest 2 View (Completed)   Other insomnia    Patient is having difficulty getting consistent adequate sleep.  He will sleep for few hours get up.  Patient is night and day are mixed up per patient's spouse.  He has been trying melatonin  without great relief.  We can consider doing a low-dose trazodone to try to get patient back on the right schedule if needed.  Pending labs and chest x-ray.  Patient also not using CPAP as prescribed.  He does need a sleep study performed again.  Will hold off currently until he gets past the full CABG recovery.  This could be why patient is having difficulty staying awake for the day and is driving to sleep.      Relevant Orders   CBC   TSH   Other Visit Diagnoses     Encounter to establish care    -  Primary       Return in about 3 months (around 03/27/2022) for Recheck.    Romilda Garret, NP

## 2021-12-26 NOTE — Assessment & Plan Note (Signed)
X 3 did mention that his diaphragm on the right side was elevated and not sure why.  Will repeat chest x-ray today pending result

## 2021-12-26 NOTE — Patient Instructions (Signed)
Nice to see you today I will be in touch with the labs and xray Follow up with me in 3 months

## 2021-12-26 NOTE — Assessment & Plan Note (Addendum)
Patient is having difficulty getting consistent adequate sleep.  He will sleep for few hours get up.  Patient is night and day are mixed up per patient's spouse.  He has been trying melatonin without great relief.  We can consider doing a low-dose trazodone to try to get patient back on the right schedule if needed.  Pending labs and chest x-ray.  Patient also not using CPAP as prescribed.  He does need a sleep study performed again.  Will hold off currently until he gets past the full CABG recovery.  This could be why patient is having difficulty staying awake for the day and is driving to sleep.

## 2021-12-26 NOTE — Assessment & Plan Note (Signed)
Patient has large varicosities on the proximal lower leg.  Also has smaller spider veins around the ankles.  Patient would like to see vascular states he does not was see [facility] he will report back to me once he finds a provider he would like to see.

## 2021-12-26 NOTE — Assessment & Plan Note (Signed)
Recently underwent CABG x 3

## 2021-12-26 NOTE — Assessment & Plan Note (Signed)
Patient has been followed by Dr. [name] is on sildenafil as needed.

## 2021-12-26 NOTE — Assessment & Plan Note (Signed)
Followed by provider Bruce lementle through [facility].  Prescribed patient testosterone follows blood work appropriately per the report

## 2021-12-26 NOTE — Assessment & Plan Note (Signed)
Has been seen by urology in the past.  Did hold TRT for a period of time but has started it back.  States TRT provider is following patient's PSA very closely

## 2021-12-26 NOTE — Assessment & Plan Note (Signed)
Most recent NSTEMI led to CABG x 3.  He has seen cardiology since hospital discharge postop 3 weeks.  On clopidogrel.  Continue following with cardiology.  Cardiology wanted lipid and liver panel done today will order for patient convenience and courtesy to cardiology and for results once we have them.

## 2021-12-31 ENCOUNTER — Telehealth: Payer: Self-pay | Admitting: Nurse Practitioner

## 2021-12-31 NOTE — Telephone Encounter (Signed)
Patient called to follow up on sleep medication that was suppose to be called in for him at his visit on 12/21.

## 2022-01-01 ENCOUNTER — Other Ambulatory Visit: Payer: Self-pay | Admitting: Nurse Practitioner

## 2022-01-01 DIAGNOSIS — G479 Sleep disorder, unspecified: Secondary | ICD-10-CM

## 2022-01-01 MED ORDER — TRAZODONE HCL 50 MG PO TABS: 25.0000 mg | ORAL_TABLET | Freq: Every evening | ORAL | 0 refills | Status: DC | PRN

## 2022-01-01 NOTE — Telephone Encounter (Signed)
Called and informed pt that RX was sent  °

## 2022-01-01 NOTE — Progress Notes (Signed)
Medication only

## 2022-01-01 NOTE — Telephone Encounter (Signed)
Medication sent in. 

## 2022-01-02 ENCOUNTER — Telehealth: Payer: Self-pay | Admitting: Nurse Practitioner

## 2022-01-02 ENCOUNTER — Other Ambulatory Visit: Payer: Self-pay | Admitting: Physician Assistant

## 2022-01-02 NOTE — Telephone Encounter (Signed)
I spoke with pt; pt had triple bypass on 12/06/21; pt has had FU with cardiology and Romilda Garret NP. Pt said since had procedure has had some symptoms of SOB which have worsened when pt lays down. Pt said no real SOB when up moving around. Pt said he has had irritated and scratchy throat for several days that has become a Sore throat when pt swallows. Pt has dry to prod cough with milky colored phlegm. When pt coughs he has sharp pain in center of chest at breast bone. Pt is not sure if has fever or not; pt said on 12/27/21 he weighed 209 lbs; pt has been taking lasix 20 mg daily for 4 days and today 01/02/22 pts weight is 214 lbs. Pt said he is not urinating much more than usual. Pt said his legs are not swollen but thinks abd is more swollen than usual. Access nurse triaged pt and thinks need be seen in 24 hrs. No available appts at [facility] today or tomorrow and no appts at other [facility] offices today. Pt does not want to go somewhere and sit for hours. Pt said he will either call cardiology or pt will go to [facility] ED when returns to home; pt is presently in [facility]. Uc & ED precautions given and pt voiced understanding. Sending note to Romilda Garret NP, [place] [facility] and will teams [employer].

## 2022-01-02 NOTE — Telephone Encounter (Signed)
Patient called in stating that he had triple bypass surgery on 12/06/21. He came in for a visit with [facility] on 12//21/23. Since then he stated that he has been having sob when laying down,an irritated throat that is causing him to cough. I sent him to access nurse to be triaged.

## 2022-01-02 NOTE — Telephone Encounter (Signed)
[place] Day - Client TELEPHONE ADVICE RECORD [facility]urse Patient Name: Keith Murillo [facility] Gender: Male DOB: [birth year] Age: 71 Y 9 M 16 D Return Phone Number: [phone] (Primary), [phone] (Secondary) Address: City/ State/ Zip: [place] [address] Client [facility] - Client Client Site [place] - Day Provider Romilda Garret- NP Contact Type Call Who Is Calling Patient / Member / Family / Caregiver Call Type Triage / Clinical Relationship To Patient Self Return Phone Number [phone] (Primary) Chief Complaint BREATHING - shortness of breath or sounds breathless Reason for Call Symptomatic / Request for [name] states his s/s shortness of breathe only when he's laying and sore throat that's causing him to cough. Translation No Guidelines Guideline Title Affirmed Question Affirmed Notes Nurse Date/Time (Eastern Time) Cough - Acute Productive SEVERE coughing spells (e.g., whooping sound after coughing, vomiting after coughing) Fredderick Phenix, RN, [name] 01/02/2022 3:02:28 PM Disp. Time Eilene Ghazi Time) Disposition Final User 01/02/2022 2:58:42 PM Send to Urgent [facility] 01/02/2022 3:06:32 PM See PCP within 24 Hours Yes Fredderick Phenix, RN, [name] Final Disposition 01/02/2022 3:06:32 PM See PCP within 24 Hours Yes Fredderick Phenix, RN, [place] Disagree/Comply Comply Caller Understands Yes PreDisposition Did not know what to do Care Advice Given Per Guideline SEE PCP WITHIN 24 HOURS: * IF OFFICE WILL BE OPEN: You need to be examined within the next 24 hours. Call your doctor (or NP/PA) when the office opens and make an appointment. CARE ADVICE given per Cough - Acute Productive (Adult) guideline. CALL BACK IF: PLEASE NOTE: All timestamps contained within this report are represented as [place] Standard Time. CONFIDENTIALTY NOTICE: This fax transmission is  intended only for the addressee. It contains information that is legally privileged, confidential or otherwise protected from use or disclosure. If you are not the intended recipient, you are strictly prohibited from reviewing, disclosing, copying using or disseminating any of this information or taking any action in reliance on or regarding this information. If you have received this fax in error, please notify us immediately by telephone so that we can arrange for its return to [place]. Phone: [phone], Toll-Free: [phone], Fax: [phone] Page: 2 of 2 Call Id: [id number] Referrals REFERRED TO PCP OFFIC

## 2022-01-02 NOTE — Telephone Encounter (Signed)
Noted. Agree that he needs to be evaluated

## 2022-01-03 ENCOUNTER — Other Ambulatory Visit: Payer: Self-pay | Admitting: Thoracic Surgery (Cardiothoracic Vascular Surgery)

## 2022-01-03 DIAGNOSIS — Z951 Presence of aortocoronary bypass graft: Secondary | ICD-10-CM

## 2022-01-07 ENCOUNTER — Ambulatory Visit (INDEPENDENT_AMBULATORY_CARE_PROVIDER_SITE_OTHER): Payer: Self-pay | Admitting: Thoracic Surgery (Cardiothoracic Vascular Surgery)

## 2022-01-07 ENCOUNTER — Ambulatory Visit
Admission: RE | Admit: 2022-01-07 | Discharge: 2022-01-07 | Disposition: A | Discharge status: Home / Self Care | Payer: BLUE CROSS/BLUE SHIELD | Source: Ambulatory Visit | Attending: Thoracic Surgery (Cardiothoracic Vascular Surgery) | Admitting: Thoracic Surgery (Cardiothoracic Vascular Surgery)

## 2022-01-07 ENCOUNTER — Encounter: Payer: Self-pay | Admitting: Thoracic Surgery (Cardiothoracic Vascular Surgery)

## 2022-01-07 VITALS — BP 148/84 | HR 94 | Resp 20 | Ht 67.0 in | Wt 219.0 lb

## 2022-01-07 DIAGNOSIS — Z951 Presence of aortocoronary bypass graft: Secondary | ICD-10-CM

## 2022-01-07 DIAGNOSIS — J90 Pleural effusion, not elsewhere classified: Secondary | ICD-10-CM | POA: Diagnosis not present

## 2022-01-07 DIAGNOSIS — J9811 Atelectasis: Secondary | ICD-10-CM | POA: Diagnosis not present

## 2022-01-07 MED ORDER — POTASSIUM CHLORIDE CRYS ER 10 MEQ PO TBCR: 20.0000 meq | EXTENDED_RELEASE_TABLET | Freq: Every day | ORAL | 3 refills | Status: DC | PRN

## 2022-01-07 MED ORDER — FUROSEMIDE 40 MG PO TABS: 40.0000 mg | ORAL_TABLET | Freq: Every day | ORAL | 3 refills | Status: DC | PRN

## 2022-01-07 MED ORDER — PREDNISONE 10 MG (21) PO TBPK: ORAL_TABLET | ORAL | 0 refills | Status: AC

## 2022-01-07 NOTE — Progress Notes (Signed)
[address]       [place] [address]             [phone]     HPI: [name] returns for a scheduled follow-up after recent coronary bypass grafting.  Keith Murillo is a 73 year old man with a history of CAD, MI, PCI, hypertension, hyperlipidemia, hypothyroidism, obesity, sleep apnea, and prostate cancer.  He presented with a non-ST elevation MI.  Cardiac catheterization revealed severe three-vessel disease.  He had extensive stenting in his LAD.  He underwent coronary bypass grafting x 3 on 12/06/2021.  The mammary was used as a sequential graft to the obtuse marginal 1 and 2.  The vein graft was placed to the LAD distally due to the extensive stenting.  His postoperative course was unremarkable and he went home on day 5.  Since discharge he has had trouble sleeping.  He feels short of breath when he tries to lie flat on his back.  Sleeping in a recliner helps a little bit.  He has had trouble sleeping through the night.  Some incisional discomfort but is only taking a tramadol once every 2 or 3 days.  No recurrent anginal pain.  No shortness of breath with exertion.  His wife has noted that he seems very impulsive and impatient since surgery.  Past Medical History:  Diagnosis Date   Cancer [facility])    prostate   Chest pain    Complication of anesthesia    fighting with waking up   Coronary artery disease    Heart attack [facility])    Hypercholesterolemia    Hyperlipidemia    Hypertension    Ischemic heart disease September 2004   Known--with non-Q-wave myocardial infarction    Obesity    Sleep apnea     Current Outpatient Medications  Medication Sig Dispense Refill   acetaminophen (TYLENOL) 500 MG tablet Take 2 tablets (1,000 mg total) by mouth every 6 (six) hours as needed. 30 tablet 0   aspirin EC 81 MG tablet Take 1 tablet (81 mg total) by mouth daily.     Cholecalciferol (VITAMIN D-3) 125 MCG (5000 UT) TABS Take 5,000 Units by mouth  daily.      clopidogrel (PLAVIX) 75 MG tablet Take 1 tablet (75 mg total) by mouth daily. 30 tablet 3   Coenzyme Q10 ([employer] COQ-10) 200 MG capsule Take 200 mg by mouth daily.     DHEA 25 MG CAPS Take 25 mg by mouth daily.      diltiazem (CARDIZEM SR) 120 MG 12 hr capsule Take 1 capsule (120 mg total) by mouth every 12 (twelve) hours. 60 capsule 3   ezetimibe (ZETIA) 10 MG tablet Take 1 tablet (10 mg total) by mouth daily. 30 tablet 3   loperamide (IMODIUM A-D) 2 MG tablet Take 1 tablet (2 mg total) by mouth 4 (four) times daily as needed for diarrhea or loose stools. 30 tablet 0   Magnesium Citrate POWD Take 1 Scoop by mouth daily as needed (regularity).     melatonin 5 MG TABS Take 5 mg by mouth at bedtime.     Menaquinone-7 (VITAMIN K2 PO) Take 150 mcg by mouth daily. MK-7     Multiple Vitamin (MULTIVITAMIN WITH MINERALS) TABS tablet Take 1 tablet by mouth daily. Men's Multivitamin by Purity     Multiple Vitamins-Minerals (IMMUNE SUPPORT PO) Take 520 mg by mouth daily. Vitac-ld Liposomal Vitamin     NON FORMULARY Take 1 capsule  by mouth daily. HAIOTB Circulation Syn3rgy (Beet Root, L-Arginine, Horse Chestnut)     NP THYROID 60 MG tablet Take 60 mg by mouth every morning.     OVER THE COUNTER MEDICATION Take 2 capsules by mouth daily. Muscadine grape capsules - 650 mg each - take 2 capsules by mouth daily     predniSONE (STERAPRED UNI-PAK 21 TAB) 10 MG (21) TBPK tablet Take 6 tablets (60 mg total) by mouth daily for 1 day, THEN 5 tablets (50 mg total) daily for 1 day, THEN 4 tablets (40 mg total) daily for 1 day, THEN 3 tablets (30 mg total) daily for 1 day, THEN 2 tablets (20 mg total) daily for 1 day, THEN 1 tablet (10 mg total) daily for 1 day. 21 tablet 0   Pregnenolone Micronized (PREGNENOLONE PO) Take 75 mg by mouth daily.     Probiotic Product (PROBIOTIC PO) Take 1 capsule by mouth daily. 20 billion     QUERCETIN PO Take 1 capsule by mouth daily. Bio-Quercetin     sildenafil (REVATIO) 20 MG  tablet take 2 to 5 tablets by mouth 1 hour prior to intercourse 90 tablet 0   testosterone cypionate (DEPOTESTOSTERONE CYPIONATE) 200 MG/ML injection Inject 1.5 mLs into the skin once a week. Once a week     traMADol (ULTRAM) 50 MG tablet Take 1 tablet (50 mg total) by mouth every 4 (four) hours as needed for moderate pain. 30 tablet 0   traZODone (DESYREL) 50 MG tablet Take 0.5-1 tablets (25-50 mg total) by mouth at bedtime as needed for sleep. 30 tablet 0   Turmeric 500 MG TABS Take 500 mg by mouth daily.     UNABLE TO FIND Take 1 drop by mouth daily as needed. Med Name: Cortexi for tinnitus     Zinc 50 MG TABS Take 50 mg by mouth daily.     furosemide (LASIX) 40 MG tablet Take 1 tablet (40 mg total) by mouth daily as needed. For weight gain of 3 lbs in 24 hours 30 tablet 3   potassium chloride (KLOR-CON M) 10 MEQ tablet Take 2 tablets (20 mEq total) by mouth daily as needed. Take when you take a Lasix (Furosemide) 60 tablet 3   No current facility-administered medications for this visit.    Physical Exam BP (!) 148/84 (BP Location: Left Arm, Patient Position: Sitting, Cuff Size: Large)   Pulse 94   Resp 20   Ht '5\' 7"'$  (1.702 m)   Wt 219 lb (99.3 kg)   SpO2 97% Comment: RA  BMI 34.54 kg/m  72 year old man in no acute distress Alert and oriented x 3 with no focal deficits Sternum stable, incision clean dry and intact Lungs diminished breath sounds at both bases Cardiac regular rate and rhythm, no rub Trace peripheral edema  Diagnostic Tests: CHEST - 2 VIEW   COMPARISON:  Chest two views 12/26/2021   FINDINGS: Status post median sternotomy and CABG. Cardiac silhouette is at the upper limits of normal size. There is again mild blunting of the bilateral costophrenic angles. Mild right hemidiaphragm elevation is unchanged. Mild associated bibasilar linear subsegmental atelectasis. No pneumothorax. Moderate multilevel degenerative disc changes of the thoracic spine.    IMPRESSION: Unchanged small bilateral pleural effusions with subsegmental atelectasis.     Electronically Signed   By: Yvonne Kendall M.D.   On: 01/07/2022 15:59 I personally reviewed the chest x-ray images.  Still some mild elevation of the right hemidiaphragm which was present preoperatively.  Bilateral  small pleural effusions.  Impression: Keith Murillo is a 72 year old man with a history of CAD, MI, PCI, hypertension, hyperlipidemia, hypothyroidism, obesity, sleep apnea, and prostate cancer.  He presented with a non-ST elevation MI.  Catheterization showed severe three-vessel disease and he underwent coronary bypass grafting x 3 on 12/06/2021.  He is now about a month out from coronary bypass grafting.  Most of the things that he and his wife are complaining about are very common in the early postoperative period and will likely improve with time.    He does have some orthopnea, likely due to the pleural effusions.  I am going to give him a steroid taper and increase his Lasix 40 to mg daily and potassium to 20 mEq daily.  He should not lift anything over 10 pounds for another 2 weeks and nothing over 20 pounds for 4 weeks.  I think he is okay to begin driving a car on a limited basis.  Appropriate precautions were discussed.  He is okay to begin cardiac rehab.  Plan: Steroid taper Lasix 40 mg daily and potassium 20 mEq daily Return in 1 month with PA and lateral chest x-ray  [name], MD [facility] [phone]

## 2022-02-11 ENCOUNTER — Encounter: Payer: Self-pay | Admitting: Thoracic Surgery (Cardiothoracic Vascular Surgery)

## 2022-02-11 ENCOUNTER — Other Ambulatory Visit: Payer: Self-pay | Admitting: Thoracic Surgery (Cardiothoracic Vascular Surgery)

## 2022-02-11 ENCOUNTER — Ambulatory Visit (INDEPENDENT_AMBULATORY_CARE_PROVIDER_SITE_OTHER): Payer: Self-pay | Admitting: Thoracic Surgery (Cardiothoracic Vascular Surgery)

## 2022-02-11 ENCOUNTER — Ambulatory Visit
Admission: RE | Admit: 2022-02-11 | Discharge: 2022-02-11 | Disposition: A | Discharge status: Home / Self Care | Payer: Medicare Other | Source: Ambulatory Visit | Attending: Thoracic Surgery (Cardiothoracic Vascular Surgery) | Admitting: Thoracic Surgery (Cardiothoracic Vascular Surgery)

## 2022-02-11 ENCOUNTER — Telehealth (HOSPITAL_COMMUNITY): Payer: Self-pay

## 2022-02-11 VITALS — BP 140/84 | HR 86 | Resp 20 | Ht 67.0 in | Wt 217.0 lb

## 2022-02-11 DIAGNOSIS — I517 Cardiomegaly: Secondary | ICD-10-CM | POA: Diagnosis not present

## 2022-02-11 DIAGNOSIS — Z951 Presence of aortocoronary bypass graft: Secondary | ICD-10-CM

## 2022-02-11 DIAGNOSIS — J9811 Atelectasis: Secondary | ICD-10-CM | POA: Diagnosis not present

## 2022-02-11 DIAGNOSIS — J90 Pleural effusion, not elsewhere classified: Secondary | ICD-10-CM | POA: Diagnosis not present

## 2022-02-11 NOTE — Progress Notes (Signed)
[address]       [place] [address]             [phone]     HPI: [name] returns for follow-up after recent coronary bypass grafting  Keith Murillo is a 72 year old gentleman with a history of coronary disease, MI, PCI, hypertension, hyperlipidemia, hypothyroidism, obesity, sleep apnea, and prostate cancer.  He presented with a non-ST elevation MI and was found to have three-vessel disease.    He underwent coronary bypass grafting x 3 on 12/06/2021.  I used the mammary as a sequential graft to obtuse marginals 1 and 2 and placed a vein to his LAD distally since he had extensive stenting in his LAD previously.  Initial postoperative course was unremarkable and he went home on day 5.  I saw him in the office on 01/07/2022.  At that time he was having a lot of trouble sleeping.  He had orthopnea.  I gave him a steroid taper and started him on Lasix.  He feels much better than he did a month ago.  However he still feels like his breathing is limited.  Past Medical History:  Diagnosis Date   Cancer [facility])    prostate   Chest pain    Complication of anesthesia    fighting with waking up   Coronary artery disease    Heart attack [facility])    Hypercholesterolemia    Hyperlipidemia    Hypertension    Ischemic heart disease September 2004   Known--with non-Q-wave myocardial infarction    Obesity    Sleep apnea     Current Outpatient Medications  Medication Sig Dispense Refill   acetaminophen (TYLENOL) 500 MG tablet Take 2 tablets (1,000 mg total) by mouth every 6 (six) hours as needed. 30 tablet 0   aspirin EC 81 MG tablet Take 1 tablet (81 mg total) by mouth daily.     Cholecalciferol (VITAMIN D-3) 125 MCG (5000 UT) TABS Take 5,000 Units by mouth daily.      clopidogrel (PLAVIX) 75 MG tablet Take 1 tablet (75 mg total) by mouth daily. 30 tablet 3   Coenzyme Q10 (CVS COQ-10) 200 MG capsule Take 200 mg by mouth daily.     DHEA 25 MG CAPS Take 25 mg  by mouth daily.      diltiazem (CARDIZEM SR) 120 MG 12 hr capsule Take 1 capsule (120 mg total) by mouth every 12 (twelve) hours. 60 capsule 3   ezetimibe (ZETIA) 10 MG tablet Take 1 tablet (10 mg total) by mouth daily. 30 tablet 3   furosemide (LASIX) 40 MG tablet Take 1 tablet (40 mg total) by mouth daily as needed. For weight gain of 3 lbs in 24 hours 30 tablet 3   loperamide (IMODIUM A-D) 2 MG tablet Take 1 tablet (2 mg total) by mouth 4 (four) times daily as needed for diarrhea or loose stools. 30 tablet 0   Magnesium Citrate POWD Take 1 Scoop by mouth daily as needed (regularity).     melatonin 5 MG TABS Take 5 mg by mouth at bedtime.     Menaquinone-7 (VITAMIN K2 PO) Take 150 mcg by mouth daily. MK-7     Multiple Vitamin (MULTIVITAMIN WITH MINERALS) TABS tablet Take 1 tablet by mouth daily. Men's Multivitamin by Purity     Multiple Vitamins-Minerals (IMMUNE SUPPORT PO) Take 520 mg by mouth daily. Vitac-ld Liposomal Vitamin     NON FORMULARY Take 1 capsule by  mouth daily. HAIOTB Circulation Syn3rgy (Beet Root, L-Arginine, Horse Chestnut)     NP THYROID 60 MG tablet Take 60 mg by mouth every morning.     OVER THE COUNTER MEDICATION Take 2 capsules by mouth daily. Muscadine grape capsules - 650 mg each - take 2 capsules by mouth daily     potassium chloride (KLOR-CON M) 10 MEQ tablet Take 2 tablets (20 mEq total) by mouth daily as needed. Take when you take a Lasix (Furosemide) 60 tablet 3   Pregnenolone Micronized (PREGNENOLONE PO) Take 75 mg by mouth daily.     Probiotic Product (PROBIOTIC PO) Take 1 capsule by mouth daily. 20 billion     QUERCETIN PO Take 1 capsule by mouth daily. Bio-Quercetin     sildenafil (REVATIO) 20 MG tablet take 2 to 5 tablets by mouth 1 hour prior to intercourse 90 tablet 0   testosterone cypionate (DEPOTESTOSTERONE CYPIONATE) 200 MG/ML injection Inject 1.5 mLs into the skin once a week. Once a week     traMADol (ULTRAM) 50 MG tablet Take 1 tablet (50 mg total) by  mouth every 4 (four) hours as needed for moderate pain. 30 tablet 0   traZODone (DESYREL) 50 MG tablet Take 0.5-1 tablets (25-50 mg total) by mouth at bedtime as needed for sleep. 30 tablet 0   Turmeric 500 MG TABS Take 500 mg by mouth daily.     UNABLE TO FIND Take 1 drop by mouth daily as needed. Med Name: Cortexi for tinnitus     Zinc 50 MG TABS Take 50 mg by mouth daily.     No current facility-administered medications for this visit.    Physical Exam BP (!) 140/84   Pulse 86   Resp 20   Ht '5\' 7"'$  (1.702 m)   Wt 217 lb (98.4 kg)   SpO2 95% Comment: RA  BMI 33.69 kg/m ' 72 year old man in no acute distress Alert and oriented x 3 with no focal deficits Lungs diminished breath sounds at right base, otherwise clear Cardiac regular rate and rhythm Sternum stable, incision well-healed  Diagnostic Tests: CHEST - 2 VIEW   COMPARISON:  01/07/2022   FINDINGS: Mild right hemidiaphragm elevation. Median sternotomy for CABG. Midline trachea. Borderline cardiomegaly. Resolved pleural effusions. No pneumothorax. No congestive failure. Resolved left and improved right base atelectasis.   IMPRESSION: Overall improved aeration, with resolved pleural effusions and left base atelectasis. Right hemidiaphragm elevation and adjacent right base atelectasis remain.     Electronically Signed   By: Abigail Miyamoto M.D.   On: 02/11/2022 14:50 I personally reviewed the chest x-ray images.  There is elevation of the right hemidiaphragm with some adjacent atelectasis and possibly some pleural fluid there as well.  Impression: Keith Murillo is a 72 year old gentleman with a history of coronary disease, MI, PCI, hypertension, hyperlipidemia, hypothyroidism, obesity, sleep apnea, and prostate cancer.  He presented with a non-ST elevation MI and was found to have three-vessel disease.    He underwent coronary bypass grafting x 3 on 12/06/2021.  His initial postoperative course was  uncomplicated but he was noted to have an elevated right hemidiaphragm on his postoperative chest x-rays.  Currently he is doing well for the most part.  He has minimal pain.  He still has some shortness of breath and some orthopnea.  That has improved with but since discharge but has not resolved.  On his chest x-ray there is elevation of the right hemidiaphragm.  It is relatively mild.  Actually  appears less than it when he was in the hospital.  There is some adjacent atelectasis but there also could be some loculated fluid.  I think a CT would be helpful in determining whether there is any effusion that we can potentially drain to help with his respiratory status.  His left pleural effusion has resolved.  Plan: CT chest to further evaluate possible right pleural effusion. Return in 1 week to discuss results  [name], MD [facility] [phone]

## 2022-02-11 NOTE — Telephone Encounter (Signed)
Called patient to see if he was interested in participating in the Cardiac Rehab Program. Patient stated yes. Patient will come in for orientation on 02/13/22 @ 10:30AM and will attend the 10:15AM exercise class. Went over insurance, patient verbalized understanding.   Tourist information centre manager.

## 2022-02-11 NOTE — Telephone Encounter (Signed)
Pt insurance is active and benefits verified through [facility]. Co-pay $0.00, DED $0.00/$0.00 met, out of pocket $2,800.00/$0.00 met, co-insurance 0%. No pre-authorization required. Passport, 02/11/22 @ 11:02AM, HFW#[id number]   How many CR sessions are covered? (36 sessions for TCR, 72 sessions for ICR)72 Is this a lifetime maximum or an annual maximum? Lifetime Has the member used any of these services to date? No Is there a time limit (weeks/months) on start of program and/or program completion? No     Will contact patient to see if he is interested in the Cardiac Rehab Program.

## 2022-02-12 ENCOUNTER — Telehealth (HOSPITAL_COMMUNITY): Payer: Self-pay

## 2022-02-13 ENCOUNTER — Encounter (HOSPITAL_COMMUNITY): Payer: Self-pay

## 2022-02-13 ENCOUNTER — Encounter (HOSPITAL_COMMUNITY)
Admission: RE | Admit: 2022-02-13 | Discharge: 2022-02-13 | Disposition: A | Discharge status: Home / Self Care | Payer: Medicare Other | Source: Ambulatory Visit | Attending: Cardiovascular Disease | Admitting: Cardiovascular Disease

## 2022-02-13 VITALS — BP 124/76 | HR 105 | Ht 68.0 in | Wt 219.8 lb

## 2022-02-13 DIAGNOSIS — Z951 Presence of aortocoronary bypass graft: Secondary | ICD-10-CM | POA: Diagnosis not present

## 2022-02-13 NOTE — Progress Notes (Signed)
Resting sinus tachycardia noted low 100's. Upon assessment, diminished lung sound right posterior base. Oxygen saturation 97%on room air. Temperature 98.8. Keith Murillo denies shortness of breath currently but does have an intermittent congested nonproductive cough. Discussed with onsite provider, Dr Rolla Etienne who said it was okay to proceed with the 6 minute walk test. Keith Murillo reported having mild chest discomfort at the end of the 6 minute walk test which resolved with rest. I also notified Dr Rolla Etienne about.  Will hold off on Keith Murillo proceeding with exercise until after he is seen and cleared to proceed with exercise by Dr hendrickson's office. Patient is aware and agreeable to the plan.Harrell Gave RN BSN

## 2022-02-13 NOTE — Progress Notes (Signed)
Cardiac Rehab Medication Review by a Nurse  Does the patient  feel that his/her medications are working for him/her?  yes  Has the patient been experiencing any side effects to the medications prescribed?  no  Does the patient measure his/her own blood pressure or blood glucose at home?  yes   Does the patient have any problems obtaining medications due to transportation or finances?   no  Understanding of regimen: excellent Understanding of indications: excellent Potential of compliance: excellent    Nurse comments: Justine Null is taking his medications as prescribed and has a good understanding of what his medications are for. Rowe checks his blood pressures daily.     Christa See Megha Agnes RN 02/13/2022 2:28 PM

## 2022-02-13 NOTE — Progress Notes (Signed)
Cardiac Individual Treatment Plan  Patient Details  Name: Keith Murillo MRN: [id number] Date of Birth: [birth year] Referring Provider:   Flowsheet Row INTENSIVE CARDIAC REHAB ORIENT from 02/13/2022 in [facility]  Referring Provider Dr. Ida Rogue, MD       Initial Encounter Date:  [facility] [place] from 02/13/2022 in [facility]  Date 02/13/22       Visit Diagnosis: 12/06/21 S/P CABG x 3  Patient's Home Medications on Admission:  Current Outpatient Medications:    acetaminophen (TYLENOL) 500 MG tablet, Take 2 tablets (1,000 mg total) by mouth every 6 (six) hours as needed., Disp: 30 tablet, Rfl: 0   aspirin EC 81 MG tablet, Take 1 tablet (81 mg total) by mouth daily., Disp: , Rfl:    Cholecalciferol (VITAMIN D-3) 125 MCG (5000 UT) TABS, Take 5,000 Units by mouth daily. , Disp: , Rfl:    clopidogrel (PLAVIX) 75 MG tablet, Take 1 tablet (75 mg total) by mouth daily., Disp: 30 tablet, Rfl: 3   Coenzyme Q10 ([employer] COQ-10) 200 MG capsule, Take 200 mg by mouth daily., Disp: , Rfl:    DHEA 25 MG CAPS, Take 25 mg by mouth daily. , Disp: , Rfl:    diltiazem (CARDIZEM SR) 120 MG 12 hr capsule, Take 1 capsule (120 mg total) by mouth every 12 (twelve) hours., Disp: 60 capsule, Rfl: 3   ezetimibe (ZETIA) 10 MG tablet, Take 1 tablet (10 mg total) by mouth daily., Disp: 30 tablet, Rfl: 3   furosemide (LASIX) 40 MG tablet, Take 1 tablet (40 mg total) by mouth daily as needed. For weight gain of 3 lbs in 24 hours, Disp: 30 tablet, Rfl: 3   loperamide (IMODIUM A-D) 2 MG tablet, Take 1 tablet (2 mg total) by mouth 4 (four) times daily as needed for diarrhea or loose stools., Disp: 30 tablet, Rfl: 0   Magnesium Citrate POWD, Take 1 Scoop by mouth daily as needed (regularity)., Disp: , Rfl:    melatonin 5 MG TABS, Take 5 mg by mouth at bedtime., Disp: , Rfl:     Menaquinone-7 (VITAMIN K2 PO), Take 150 mcg by mouth daily. MK-7, Disp: , Rfl:    Multiple Vitamins-Minerals (IMMUNE SUPPORT PO), Take 520 mg by mouth daily. Vitac-ld Liposomal Vitamin, Disp: , Rfl:    NON FORMULARY, Take 1 capsule by mouth daily. HAIOTB Circulation Syn3rgy (Beet Root, L-Arginine, Horse Chestnut), Disp: , Rfl:    NP THYROID 60 MG tablet, Take 60 mg by mouth every morning., Disp: , Rfl:    OVER THE COUNTER MEDICATION, Take 2 capsules by mouth daily. [place] grape capsules - 650 mg each - take 2 capsules by mouth daily, Disp: , Rfl:    potassium chloride (KLOR-CON M) 10 MEQ tablet, Take 2 tablets (20 mEq total) by mouth daily as needed. Take when you take a Lasix (Furosemide), Disp: 60 tablet, Rfl: 3   Pregnenolone Micronized (PREGNENOLONE PO), Take 75 mg by mouth daily., Disp: , Rfl:    Probiotic Product (PROBIOTIC PO), Take 1 capsule by mouth daily. 20 billion, Disp: , Rfl:    QUERCETIN PO, Take 1 capsule by mouth daily. Bio-Quercetin, Disp: , Rfl:    traMADol (ULTRAM) 50 MG tablet, Take 1 tablet (50 mg total) by mouth every 4 (four) hours as needed for moderate pain., Disp: 30 tablet, Rfl: 0   traZODone (DESYREL) 50  MG tablet, Take 0.5-1 tablets (25-50 mg total) by mouth at bedtime as needed for sleep., Disp: 30 tablet, Rfl: 0   Turmeric 500 MG TABS, Take 500 mg by mouth daily., Disp: , Rfl:    UNABLE TO FIND, Take 1 drop by mouth daily as needed. Med Name: Cortexi for tinnitus, Disp: , Rfl:    Zinc 50 MG TABS, Take 50 mg by mouth daily., Disp: , Rfl:    sildenafil (REVATIO) 20 MG tablet, take 2 to 5 tablets by mouth 1 hour prior to intercourse (Patient not taking: Reported on 02/13/2022), Disp: 90 tablet, Rfl: 0   testosterone cypionate (DEPOTESTOSTERONE CYPIONATE) 200 MG/ML injection, Inject 0.5 mLs into the skin once a week. Once a week, Disp: , Rfl:   Past Medical History: Past Medical History:  Diagnosis Date   Cancer ([place])    prostate   Chest pain    Complication of  anesthesia    fighting with waking up   Coronary artery disease    Heart attack ([place])    Hypercholesterolemia    Hyperlipidemia    Hypertension    Ischemic heart disease September 2004   Known--with non-Q-wave myocardial infarction    Obesity    Sleep apnea     Tobacco Use: Social History   Tobacco Use  Smoking Status Never  Smokeless Tobacco Never    Labs: Review Flowsheet  More data exists      Latest Ref Rng & Units 07/30/2021 11/28/2021 12/05/2021 12/06/2021 12/26/2021  Labs for ITP Cardiac and Pulmonary Rehab  Cholestrol 0 - 200 mg/dL 186  179  - - 158   LDL (calc) 0 - 99 mg/dL 114  82  - - 84   HDL-C >39.00 mg/dL 47  33  - - 51.30   Trlycerides 0.0 - 149.0 mg/dL 142  322  - - 111.0   Hemoglobin A1c 4.8 - 5.6 % - 5.7  - - -  PH, Arterial 7.35 - 7.45 - - 7.43  7.322  7.327  7.319  7.206  7.284  7.330  -  PCO2 arterial 32 - 48 mmHg - - 33  38.1  42.1  39.8  59.9  48.2  46.2  -  Bicarbonate 20.0 - 28.0 mmol/L - - 21.9  19.5  21.8  20.3  23.8  22.9  23.8  24.3  -  TCO2 22 - 32 mmol/L - - - '21  23  21  26  22  24  25  23  25  26  24  26  '$ -  Acid-base deficit 0.0 - 2.0 mmol/L - - 1.7  6.0  4.0  5.0  5.0  4.0  3.0  2.0  -  O2 Saturation % - - 98.2  95  97  99  86  98  81  100  -    Capillary Blood Glucose: Lab Results  Component Value Date   GLUCAP 111 (H) 12/09/2021   GLUCAP 113 (H) 12/09/2021   GLUCAP 179 (H) 12/09/2021   GLUCAP 131 (H) 12/09/2021   GLUCAP 126 (H) 12/09/2021     Exercise Target Goals: Exercise Program Goal: Individual exercise prescription set using results from initial 6 min walk test and THRR while considering  patient's activity barriers and safety.   Exercise Prescription Goal: Initial exercise prescription builds to 30-45 minutes a day of aerobic activity, 2-3 days per week.  Home exercise guidelines will be given to patient during program as part of exercise prescription that  the participant will acknowledge.  Activity Barriers & Risk  Stratification:  Activity Barriers & Cardiac Risk Stratification - 02/13/22 1512       Activity Barriers & Cardiac Risk Stratification   Activity Barriers Shortness of Breath;Balance Concerns    Cardiac Risk Stratification High   under 5 MET's on 6MWT            6 Minute Walk:  6 Minute Walk     Row Name 02/13/22 1510         6 Minute Walk   Phase Initial     Distance 1530 feet     Walk Time 6 minutes     # of Rest Breaks 0     MPH 2.9     METS 3.37     RPE 11     Perceived Dyspnea  0     VO2 Peak 11.78     Symptoms Yes (comment)     Comments Mild chest pressure post walk test, resolved with rest,     Resting HR 105 bpm     Resting BP 124/76     Resting Oxygen Saturation  97 %     Exercise Oxygen Saturation  during 6 min walk 97 %     Max Ex. HR 117 bpm     Max Ex. BP 158/90     2 Minute Post BP 138/82              Oxygen Initial Assessment:   Oxygen Re-Evaluation:   Oxygen Discharge (Final Oxygen Re-Evaluation):   Initial Exercise Prescription:  Initial Exercise Prescription - 02/13/22 1500       Date of Initial Exercise RX and Referring Provider   Date 02/13/22    Referring Provider Dr. Ida Rogue, MD    Expected Discharge Date 04/25/22      Bike   Level 2    Minutes 15    METs 2.3      NuStep   Level 2    SPM 85    Minutes 15    METs 2.3      Prescription Details   Frequency (times per week) 3    Duration Progress to 30 minutes of continuous aerobic without signs/symptoms of physical distress      Intensity   THRR 40-80% of Max Heartrate 60-120    Ratings of Perceived Exertion 11-13    Perceived Dyspnea 0-4      Progression   Progression Continue progressive overload as per policy without signs/symptoms or physical distress.      Resistance Training   Training Prescription Yes    Weight 4    Reps 10-15             Perform Capillary Blood Glucose checks as needed.  Exercise Prescription Changes:   Exercise  Comments:   Exercise Goals and Review:   Exercise Goals     Row Name 02/13/22 1521             Exercise Goals   Increase Physical Activity Yes       Intervention Provide advice, education, support and counseling about physical activity/exercise needs.;Develop an individualized exercise prescription for aerobic and resistive training based on initial evaluation findings, risk stratification, comorbidities and participant's personal goals.       Expected Outcomes Short Term: Attend rehab on a regular basis to increase amount of physical activity.;Long Term: Exercising regularly at least 3-5 days a week.;Long Term: Add in home exercise to make exercise  part of routine and to increase amount of physical activity.       Increase Strength and Stamina Yes       Intervention Provide advice, education, support and counseling about physical activity/exercise needs.;Develop an individualized exercise prescription for aerobic and resistive training based on initial evaluation findings, risk stratification, comorbidities and participant's personal goals.       Expected Outcomes Short Term: Increase workloads from initial exercise prescription for resistance, speed, and METs.;Short Term: Perform resistance training exercises routinely during rehab and add in resistance training at home;Long Term: Improve cardiorespiratory fitness, muscular endurance and strength as measured by increased METs and functional capacity (6MWT)       Able to understand and use rate of perceived exertion (RPE) scale Yes       Intervention Provide education and explanation on how to use RPE scale       Expected Outcomes Short Term: Able to use RPE daily in rehab to express subjective intensity level;Long Term:  Able to use RPE to guide intensity level when exercising independently       Knowledge and understanding of Target Heart Rate Range (THRR) Yes       Intervention Provide education and explanation of THRR including how the  numbers were predicted and where they are located for reference       Expected Outcomes Short Term: Able to state/look up THRR;Long Term: Able to use THRR to govern intensity when exercising independently;Short Term: Able to use daily as guideline for intensity in rehab       Understanding of Exercise Prescription Yes       Intervention Provide education, explanation, and written materials on patient's individual exercise prescription       Expected Outcomes Short Term: Able to explain program exercise prescription;Long Term: Able to explain home exercise prescription to exercise independently                Exercise Goals Re-Evaluation :   Discharge Exercise Prescription (Final Exercise Prescription Changes):   Nutrition:  Target Goals: Understanding of nutrition guidelines, daily intake of sodium '1500mg'$ , cholesterol '200mg'$ , calories 30% from fat and 7% or less from saturated fats, daily to have 5 or more servings of fruits and vegetables.  Biometrics:  Pre Biometrics - 02/13/22 1521       Pre Biometrics   Waist Circumference 43 inches    Hip Circumference 44 inches    Waist to Hip Ratio 0.98 %    Triceps Skinfold 14 mm    % Body Fat 30.9 %    Grip Strength 42 kg    Flexibility --   pt unable to reach   Single Leg Stand 6.7 seconds              Nutrition Therapy Plan and Nutrition Goals:   Nutrition Assessments:  MEDIFICTS Score Key: ?70 Need to make dietary changes  40-70 Heart Healthy Diet ? 40 Therapeutic Level Cholesterol Diet    Picture Your Plate Scores: <37 Unhealthy dietary pattern with much room for improvement. 41-50 Dietary pattern unlikely to meet recommendations for good health and room for improvement. 51-60 More healthful dietary pattern, with some room for improvement.  >60 Healthy dietary pattern, although there may be some specific behaviors that could be improved.    Nutrition Goals Re-Evaluation:   Nutrition Goals  Re-Evaluation:   Nutrition Goals Discharge (Final Nutrition Goals Re-Evaluation):   Psychosocial: Target Goals: Acknowledge presence or absence of significant depression and/or stress, maximize coping skills,  provide positive support system. Participant is able to verbalize types and ability to use techniques and skills needed for reducing stress and depression.  Initial Review & Psychosocial Screening:  Initial Psych Review & Screening - 02/13/22 1448       Initial Review   Current issues with None Identified      Family Dynamics   Good Support System? Yes      Barriers   Psychosocial barriers to participate in program There are no identifiable barriers or psychosocial needs.      Screening Interventions   Interventions Encouraged to exercise             Quality of Life Scores:  Quality of Life - 02/13/22 1524       Quality of Life   Select Quality of Life      Quality of Life Scores   Health/Function Pre 18.1 %    Socioeconomic Pre 22.14 %    Psych/Spiritual Pre 20.57 %    Family Pre 28.8 %    GLOBAL Pre 21.01 %            Scores of 19 and below usually indicate a poorer quality of life in these areas.  A difference of  2-3 points is a clinically meaningful difference.  A difference of 2-3 points in the total score of the Quality of Life Index has been associated with significant improvement in overall quality of life, self-image, physical symptoms, and general health in studies assessing change in quality of life.  PHQ-9: Review Flowsheet       02/13/2022 12/26/2021 01/30/2017 10/31/2016  Depression screen PHQ 2/9  Decreased Interest 0 0 0 0  Down, Depressed, Hopeless 0 0 0 0  PHQ - 2 Score 0 0 0 0  Altered sleeping 0 - - -  Tired, decreased energy 1 - - -  Change in appetite 0 - - -  Feeling bad or failure about yourself  0 - - -  Trouble concentrating 0 - - -  Moving slowly or fidgety/restless 0 - - -  Suicidal thoughts 0 - - -  PHQ-9 Score 1 - -  -  Difficult doing work/chores Not difficult at all - - -   Interpretation of Total Score  Total Score Depression Severity:  1-4 = Minimal depression, 5-9 = Mild depression, 10-14 = Moderate depression, 15-19 = Moderately severe depression, 20-27 = Severe depression   Psychosocial Evaluation and Intervention:   Psychosocial Re-Evaluation:   Psychosocial Discharge (Final Psychosocial Re-Evaluation):   Vocational Rehabilitation: Provide vocational rehab assistance to qualifying candidates.   Vocational Rehab Evaluation & Intervention:  Vocational Rehab - 02/13/22 1459       Initial Vocational Rehab Evaluation & Intervention   Assessment shows need for Vocational Rehabilitation No   Justine Null is retired and does not need vocational rehab at this time.            Education: Education Goals: Education classes will be provided on a weekly basis, covering required topics. Participant will state understanding/return demonstration of topics presented.     Core Videos: Exercise    Move It!  Clinical staff conducted group or individual video education with verbal and written material and guidebook.  Patient learns the recommended Pritikin exercise program. Exercise with the goal of living a long, healthy life. Some of the health benefits of exercise include controlled diabetes, healthier blood pressure levels, improved cholesterol levels, improved heart and lung capacity, improved sleep, and better body composition. Everyone should  speak with their doctor before starting or changing an exercise routine.  Biomechanical Limitations Clinical staff conducted group or individual video education with verbal and written material and guidebook.  Patient learns how biomechanical limitations can impact exercise and how we can mitigate and possibly overcome limitations to have an impactful and balanced exercise routine.  Body Composition Clinical staff conducted group or individual video  education with verbal and written material and guidebook.  Patient learns that body composition (ratio of muscle mass to fat mass) is a key component to assessing overall fitness, rather than body weight alone. Increased fat mass, especially visceral belly fat, can put [place] at increased risk for metabolic syndrome, type 2 diabetes, heart disease, and even death. It is recommended to combine diet and exercise (cardiovascular and resistance training) to improve your body composition. Seek guidance from your physician and exercise physiologist before implementing an exercise routine.  Exercise Action Plan Clinical staff conducted group or individual video education with verbal and written material and guidebook.  Patient learns the recommended strategies to achieve and enjoy long-term exercise adherence, including variety, self-motivation, self-efficacy, and positive decision making. Benefits of exercise include fitness, good health, weight management, more energy, better sleep, less stress, and overall well-being.  Medical   Heart Disease Risk Reduction Clinical staff conducted group or individual video education with verbal and written material and guidebook.  Patient learns our heart is our most vital organ as it circulates oxygen, nutrients, white blood cells, and hormones throughout the entire body, and carries waste away. Data supports a plant-based eating plan like the Pritikin Program for its effectiveness in slowing progression of and reversing heart disease. The video provides a number of recommendations to address heart disease.   Metabolic Syndrome and Belly Fat  Clinical staff conducted group or individual video education with verbal and written material and guidebook.  Patient learns what metabolic syndrome is, how it leads to heart disease, and how one can reverse it and keep it from coming back. You have metabolic syndrome if you have 3 of the following 5 criteria: abdominal obesity, high  blood pressure, high triglycerides, low HDL cholesterol, and high blood sugar.  Hypertension and Heart Disease Clinical staff conducted group or individual video education with verbal and written material and guidebook.  Patient learns that high blood pressure, or hypertension, is very common in the [place]. Hypertension is largely due to excessive salt intake, but other important risk factors include being overweight, physical inactivity, drinking too much alcohol, smoking, and not eating enough potassium from fruits and vegetables. High blood pressure is a leading risk factor for heart attack, stroke, congestive heart failure, dementia, kidney failure, and premature death. Long-term effects of excessive salt intake include stiffening of the arteries and thickening of heart muscle and organ damage. Recommendations include ways to reduce hypertension and the risk of heart disease.  Diseases of Our Time - Focusing on Diabetes Clinical staff conducted group or individual video education with verbal and written material and guidebook.  Patient learns why the best way to stop diseases of our time is prevention, through food and other lifestyle changes. Medicine (such as prescription pills and surgeries) is often only a Band-Aid on the problem, not a long-term solution. Most common diseases of our time include obesity, type 2 diabetes, hypertension, heart disease, and cancer. The Pritikin Program is recommended and has been proven to help reduce, reverse, and/or prevent the damaging effects of metabolic syndrome.  Nutrition   Overview of the Pritikin  Eating Plan  Clinical staff conducted group or individual video education with verbal and written material and guidebook.  Patient learns about the [place] for disease risk reduction. The [place] emphasizes a wide variety of unrefined, minimally-processed carbohydrates, like fruits, vegetables, whole grains, and legumes. Go,  Caution, and Stop food choices are explained. Plant-based and lean animal proteins are emphasized. Rationale provided for low sodium intake for blood pressure control, low added sugars for blood sugar stabilization, and low added fats and oils for coronary artery disease risk reduction and weight management.  Calorie Density  Clinical staff conducted group or individual video education with verbal and written material and guidebook.  Patient learns about calorie density and how it impacts the Pritikin Eating Plan. Knowing the characteristics of the food you choose will help you decide whether those foods will lead to weight gain or weight loss, and whether you want to consume more or less of them. Weight loss is usually a side effect of the Pritikin Eating Plan because of its focus on low calorie-dense foods.  Label Reading  Clinical staff conducted group or individual video education with verbal and written material and guidebook.  Patient learns about the Pritikin recommended label reading guidelines and corresponding recommendations regarding calorie density, added sugars, sodium content, and whole grains.  Dining Out - Part 1  Clinical staff conducted group or individual video education with verbal and written material and guidebook.  Patient learns that restaurant meals can be sabotaging because they can be so high in calories, fat, sodium, and/or sugar. Patient learns recommended strategies on how to positively address this and avoid unhealthy pitfalls.  Facts on Fats  Clinical staff conducted group or individual video education with verbal and written material and guidebook.  Patient learns that lifestyle modifications can be just as effective, if not more so, as many medications for lowering your risk of heart disease. A Pritikin lifestyle can help to reduce your risk of inflammation and atherosclerosis (cholesterol build-up, or plaque, in the artery walls). Lifestyle interventions such as  dietary choices and physical activity address the cause of atherosclerosis. A review of the types of fats and their impact on blood cholesterol levels, along with dietary recommendations to reduce fat intake is also included.  Nutrition Action Plan  Clinical staff conducted group or individual video education with verbal and written material and guidebook.  Patient learns how to incorporate Pritikin recommendations into their lifestyle. Recommendations include planning and keeping personal health goals in mind as an important part of their success.  Healthy Mind-Set    Healthy Minds, Bodies, Hearts  Clinical staff conducted group or individual video education with verbal and written material and guidebook.  Patient learns how to identify when they are stressed. Video will discuss the impact of that stress, as well as the many benefits of stress management. Patient will also be introduced to stress management techniques. The way we think, act, and feel has an impact on our hearts.  How Our Thoughts Can Heal Our Hearts  Clinical staff conducted group or individual video education with verbal and written material and guidebook.  Patient learns that negative thoughts can cause depression and anxiety. This can result in negative lifestyle behavior and serious health problems. Cognitive behavioral therapy is an effective method to help control our thoughts in order to change and improve our emotional outlook.  Additional Videos:  Exercise    Improving Performance  Clinical staff conducted group or individual video education with verbal  and written material and guidebook.  Patient learns to use a non-linear approach by alternating intensity levels and lengths of time spent exercising to help burn more calories and lose more body fat. Cardiovascular exercise helps improve heart health, metabolism, hormonal balance, blood sugar control, and recovery from fatigue. Resistance training improves strength,  endurance, balance, coordination, reaction time, metabolism, and muscle mass. Flexibility exercise improves circulation, posture, and balance. Seek guidance from your physician and exercise physiologist before implementing an exercise routine and learn your capabilities and proper form for all exercise.  Introduction to Yoga  Clinical staff conducted group or individual video education with verbal and written material and guidebook.  Patient learns about yoga, a discipline of the coming together of mind, breath, and body. The benefits of yoga include improved flexibility, improved range of motion, better posture and core strength, increased lung function, weight loss, and positive self-image. Yoga's heart health benefits include lowered blood pressure, healthier heart rate, decreased cholesterol and triglyceride levels, improved immune function, and reduced stress. Seek guidance from your physician and exercise physiologist before implementing an exercise routine and learn your capabilities and proper form for all exercise.  Medical   Aging: Enhancing Your Quality of Life  Clinical staff conducted group or individual video education with verbal and written material and guidebook.  Patient learns key strategies and recommendations to stay in good physical health and enhance quality of life, such as prevention strategies, having an advocate, securing a [place], and keeping a list of medications and system for tracking them. It also discusses how to avoid risk for bone loss.  Biology of Weight Control  Clinical staff conducted group or individual video education with verbal and written material and guidebook.  Patient learns that weight gain occurs because we consume more calories than we burn (eating more, moving less). Even if your body weight is normal, you may have higher ratios of fat compared to muscle mass. Too much body fat puts you at increased risk for  cardiovascular disease, heart attack, stroke, type 2 diabetes, and obesity-related cancers. In addition to exercise, following the [place] can help reduce your risk.  Decoding Lab Results  Clinical staff conducted group or individual video education with verbal and written material and guidebook.  Patient learns that lab test reflects one measurement whose values change over time and are influenced by many factors, including medication, stress, sleep, exercise, food, hydration, pre-existing medical conditions, and more. It is recommended to use the knowledge from this video to become more involved with your lab results and evaluate your numbers to speak with your doctor.   Diseases of Our Time - Overview  Clinical staff conducted group or individual video education with verbal and written material and guidebook.  Patient learns that according to the CDC, 50% to 70% of chronic diseases (such as obesity, type 2 diabetes, elevated lipids, hypertension, and heart disease) are avoidable through lifestyle improvements including healthier food choices, listening to satiety cues, and increased physical activity.  Sleep Disorders Clinical staff conducted group or individual video education with verbal and written material and guidebook.  Patient learns how good quality and duration of sleep are important to overall health and well-being. Patient also learns about sleep disorders and how they impact health along with recommendations to address them, including discussing with a physician.  Nutrition  Dining Out - Part 2 Clinical staff conducted group or individual video education with verbal and written material and guidebook.  Patient learns how to plan ahead and communicate in order to maximize their dining experience in a healthy and nutritious manner. Included are recommended food choices based on the type of restaurant the patient is visiting.   Fueling a Best boy  conducted group or individual video education with verbal and written material and guidebook.  There is a strong connection between our food choices and our health. Diseases like obesity and type 2 diabetes are very prevalent and are in large-part due to lifestyle choices. The Pritikin Eating Plan provides plenty of food and hunger-curbing satisfaction. It is easy to follow, affordable, and helps reduce health risks.  Menu Workshop  Clinical staff conducted group or individual video education with verbal and written material and guidebook.  Patient learns that restaurant meals can sabotage health goals because they are often packed with calories, fat, sodium, and sugar. Recommendations include strategies to plan ahead and to communicate with the manager, chef, or server to help order a healthier meal.  Planning Your Eating Strategy  Clinical staff conducted group or individual video education with verbal and written material and guidebook.  Patient learns about the [place] and its benefit of reducing the risk of disease. The [place] does not focus on calories. Instead, it emphasizes high-quality, nutrient-rich foods. By knowing the characteristics of the foods, we choose, we can determine their calorie density and make informed decisions.  Targeting Your Nutrition Priorities  Clinical staff conducted group or individual video education with verbal and written material and guidebook.  Patient learns that lifestyle habits have a tremendous impact on disease risk and progression. This video provides eating and physical activity recommendations based on your personal health goals, such as reducing LDL cholesterol, losing weight, preventing or controlling type 2 diabetes, and reducing high blood pressure.  Vitamins and Minerals  Clinical staff conducted group or individual video education with verbal and written material and guidebook.  Patient learns different ways to obtain  key vitamins and minerals, including through a recommended healthy diet. It is important to discuss all supplements you take with your doctor.   Healthy Mind-Set    Smoking Cessation  Clinical staff conducted group or individual video education with verbal and written material and guidebook.  Patient learns that cigarette smoking and tobacco addiction pose a serious health risk which affects millions of people. Stopping smoking will significantly reduce the risk of heart disease, lung disease, and many forms of cancer. Recommended strategies for quitting are covered, including working with your doctor to develop a successful plan.  Culinary   Becoming a Financial trader conducted group or individual video education with verbal and written material and guidebook.  Patient learns that cooking at home can be healthy, cost-effective, quick, and puts them in control. Keys to cooking healthy recipes will include looking at your recipe, assessing your equipment needs, planning ahead, making it simple, choosing cost-effective seasonal ingredients, and limiting the use of added fats, salts, and sugars.  Cooking - Breakfast and Snacks  Clinical staff conducted group or individual video education with verbal and written material and guidebook.  Patient learns how important breakfast is to satiety and nutrition through the entire day. Recommendations include key foods to eat during breakfast to help stabilize blood sugar levels and to prevent overeating at meals later in the day. Planning ahead is also a key component.  Cooking - Human resources officer conducted group or individual video education with verbal  and written material and guidebook.  Patient learns eating strategies to improve overall health, including an approach to cook more at home. Recommendations include thinking of animal protein as a side on your plate rather than center stage and focusing instead on lower calorie  dense options like vegetables, fruits, whole grains, and plant-based proteins, such as beans. Making sauces in large quantities to freeze for later and leaving the skin on your vegetables are also recommended to maximize your experience.  Cooking - Healthy Salads and Dressing Clinical staff conducted group or individual video education with verbal and written material and guidebook.  Patient learns that vegetables, fruits, whole grains, and legumes are the foundations of the [place]. Recommendations include how to incorporate each of these in flavorful and healthy salads, and how to create homemade salad dressings. Proper handling of ingredients is also covered. Cooking - Soups and [employer] - Soups and Desserts Clinical staff conducted group or individual video education with verbal and written material and guidebook.  Patient learns that Pritikin soups and desserts make for easy, nutritious, and delicious snacks and meal components that are low in sodium, fat, sugar, and calorie density, while high in vitamins, minerals, and filling fiber. Recommendations include simple and healthy ideas for soups and desserts.   Overview     The Pritikin Solution Program Overview Clinical staff conducted group or individual video education with verbal and written material and guidebook.  Patient learns that the results of the [place] Program have been documented in more than 100 articles published in peer-reviewed journals, and the benefits include reducing risk factors for (and, in some cases, even reversing) high cholesterol, high blood pressure, type 2 diabetes, obesity, and more! An overview of the three key pillars of the Pritikin Program will be covered: eating well, doing regular exercise, and having a healthy mind-set.  WORKSHOPS  Exercise: Exercise Basics: Building Your Action Plan Clinical staff led group instruction and group discussion with PowerPoint presentation and patient  guidebook. To enhance the learning environment the use of posters, models and videos may be added. At the conclusion of this workshop, patients will comprehend the difference between physical activity and exercise, as well as the benefits of incorporating both, into their routine. Patients will understand the FITT (Frequency, Intensity, Time, and Type) principle and how to use it to build an exercise action plan. In addition, safety concerns and other considerations for exercise and cardiac rehab will be addressed by the presenter. The purpose of this lesson is to promote a comprehensive and effective weekly exercise routine in order to improve patients' overall level of fitness.   Managing Heart Disease: Your Path to a Healthier Heart Clinical staff led group instruction and group discussion with PowerPoint presentation and patient guidebook. To enhance the learning environment the use of posters, models and videos may be added.At the conclusion of this workshop, patients will understand the anatomy and physiology of the heart. Additionally, they will understand how Pritikin's three pillars impact the risk factors, the progression, and the management of heart disease.  The purpose of this lesson is to provide a high-level overview of the heart, heart disease, and how the Pritikin lifestyle positively impacts risk factors.  Exercise Biomechanics Clinical staff led group instruction and group discussion with PowerPoint presentation and patient guidebook. To enhance the learning environment the use of posters, models and videos may be added. Patients will learn how the structural parts of their bodies function and how these functions impact their daily  activities, movement, and exercise. Patients will learn how to promote a neutral spine, learn how to manage pain, and identify ways to improve their physical movement in order to promote healthy living. The purpose of this lesson is to expose patients  to common physical limitations that impact physical activity. Participants will learn practical ways to adapt and manage aches and pains, and to minimize their effect on regular exercise. Patients will learn how to maintain good posture while sitting, walking, and lifting.  Balance Training and Fall Prevention  Clinical staff led group instruction and group discussion with PowerPoint presentation and patient guidebook. To enhance the learning environment the use of posters, models and videos may be added. At the conclusion of this workshop, patients will understand the importance of their sensorimotor skills (vision, proprioception, and the vestibular system) in maintaining their ability to balance as they age. Patients will apply a variety of balancing exercises that are appropriate for their current level of function. Patients will understand the common causes for poor balance, possible solutions to these problems, and ways to modify their physical environment in order to minimize their fall risk. The purpose of this lesson is to teach patients about the importance of maintaining balance as they age and ways to minimize their risk of falling.  WORKSHOPS   Nutrition:  Fueling a Scientist, research (physical sciences) led group instruction and group discussion with PowerPoint presentation and patient guidebook. To enhance the learning environment the use of posters, models and videos may be added. Patients will review the foundational principles of the [place] and understand what constitutes a serving size in each of the food groups. Patients will also learn Pritikin-friendly foods that are better choices when away from home and review make-ahead meal and snack options. Calorie density will be reviewed and applied to three nutrition priorities: weight maintenance, weight loss, and weight gain. The purpose of this lesson is to reinforce (in a group setting) the key concepts around what patients are  recommended to eat and how to apply these guidelines when away from home by planning and selecting Pritikin-friendly options. Patients will understand how calorie density may be adjusted for different weight management goals.  Mindful Eating  Clinical staff led group instruction and group discussion with PowerPoint presentation and patient guidebook. To enhance the learning environment the use of posters, models and videos may be added. Patients will briefly review the concepts of the [place] and the importance of low-calorie dense foods. The concept of mindful eating will be introduced as well as the importance of paying attention to internal hunger signals. Triggers for non-hunger eating and techniques for dealing with triggers will be explored. The purpose of this lesson is to provide patients with the opportunity to review the basic principles of the [place], discuss the value of eating mindfully and how to measure internal cues of hunger and fullness using the Hunger Scale. Patients will also discuss reasons for non-hunger eating and learn strategies to use for controlling emotional eating.  Targeting Your Nutrition Priorities Clinical staff led group instruction and group discussion with PowerPoint presentation and patient guidebook. To enhance the learning environment the use of posters, models and videos may be added. Patients will learn how to determine their genetic susceptibility to disease by reviewing their family history. Patients will gain insight into the importance of diet as part of an overall healthy lifestyle in mitigating the impact of genetics and other environmental insults. The purpose of this lesson is  to provide patients with the opportunity to assess their personal nutrition priorities by looking at their family history, their own health history and current risk factors. Patients will also be able to discuss ways of prioritizing and modifying the [place] for their highest risk areas  Menu  Clinical staff led group instruction and group discussion with PowerPoint presentation and patient guidebook. To enhance the learning environment the use of posters, models and videos may be added. Using menus brought in from [employer], or printed from [employer], patients will apply the [place] dining out guidelines that were presented in the [employer] video. Patients will also be able to practice these guidelines in a variety of provided scenarios. The purpose of this lesson is to provide patients with the opportunity to practice hands-on learning of the [place] with actual menus and practice scenarios.  Label Reading Clinical staff led group instruction and group discussion with PowerPoint presentation and patient guidebook. To enhance the learning environment the use of posters, models and videos may be added. Patients will review and discuss the Pritikin label reading guidelines presented in Pritikin's Label Reading Educational series video. Using fool labels brought in from local grocery stores and markets, patients will apply the label reading guidelines and determine if the packaged food meet the Pritikin guidelines. The purpose of this lesson is to provide patients with the opportunity to review, discuss, and practice hands-on learning of the Pritikin Label Reading guidelines with actual packaged food labels. [employer] Workshops are designed to teach patients ways to prepare quick, simple, and affordable recipes at home. The importance of nutrition's role in chronic disease risk reduction is reflected in its emphasis in the overall Pritikin program. By learning how to prepare essential core Pritikin Eating Plan recipes, patients will increase control over what they eat; be able to customize the flavor of foods without the use of added salt, sugar, or fat; and  improve the quality of the food they consume. By learning a set of core recipes which are easily assembled, quickly prepared, and affordable, patients are more likely to prepare more healthy foods at home. These workshops focus on convenient breakfasts, simple entres, side dishes, and desserts which can be prepared with minimal effort and are consistent with nutrition recommendations for cardiovascular risk reduction. Cooking [employer] are taught by a Engineer, materials (RD) who has been trained by the [employer]. The chef or RD has a clear understanding of the importance of minimizing - if not completely eliminating - added fat, sugar, and sodium in recipes. Throughout the series of [place] Workshop sessions, patients will learn about healthy ingredients and efficient methods of cooking to build confidence in their capability to prepare    Cooking School weekly topics:  Adding Flavor- Sodium-Free  Fast and Healthy Breakfasts  Powerhouse Plant-Based Proteins  Satisfying Salads and Dressings  Simple Sides and Sauces  International Cuisine-Spotlight on the [place] Zones  Delicious Desserts  Savory Soups  Efficiency Cooking - Meals in a Snap  Tasty Appetizers and Snacks  Comforting Weekend Breakfasts  One-Pot Wonders   Fast Evening Meals  Easy [place] (Psychosocial): New Thoughts, New Behaviors Clinical staff led group instruction and group discussion with PowerPoint presentation and patient guidebook. To enhance the learning environment the use of posters, models and videos may be added. Patients will learn and practice  techniques for developing effective health and lifestyle goals. Patients will be able to effectively apply the goal setting process learned to develop at least one new personal goal.  The purpose of this lesson is to expose patients to a new skill set of behavior  modification techniques such as techniques setting SMART goals, overcoming barriers, and achieving new thoughts and new behaviors.  Managing Moods and Relationships Clinical staff led group instruction and group discussion with PowerPoint presentation and patient guidebook. To enhance the learning environment the use of posters, models and videos may be added. Patients will learn how emotional and chronic stress factors can impact their health and relationships. They will learn healthy ways to manage their moods and utilize positive coping mechanisms. In addition, ICR patients will learn ways to improve communication skills. The purpose of this lesson is to expose patients to ways of understanding how one's mood and health are intimately connected. Developing a healthy outlook can help build positive relationships and connections with others. Patients will understand the importance of utilizing effective communication skills that include actively listening and being heard. They will learn and understand the importance of the "4 Cs" and especially Connections in fostering of a Healthy Mind-Set.  Healthy Sleep for a Healthy Heart Clinical staff led group instruction and group discussion with PowerPoint presentation and patient guidebook. To enhance the learning environment the use of posters, models and videos may be added. At the conclusion of this workshop, patients will be able to demonstrate knowledge of the importance of sleep to overall health, well-being, and quality of life. They will understand the symptoms of, and treatments for, common sleep disorders. Patients will also be able to identify daytime and nighttime behaviors which impact sleep, and they will be able to apply these tools to help manage sleep-related challenges. The purpose of this lesson is to provide patients with a general overview of sleep and outline the importance of quality sleep. Patients will learn about a few of the most common  sleep disorders. Patients will also be introduced to the concept of "sleep hygiene," and discover ways to self-manage certain sleeping problems through simple daily behavior changes. Finally, the workshop will motivate patients by clarifying the links between quality sleep and their goals of heart-healthy living.   Recognizing and Reducing Stress Clinical staff led group instruction and group discussion with PowerPoint presentation and patient guidebook. To enhance the learning environment the use of posters, models and videos may be added. At the conclusion of this workshop, patients will be able to understand the types of stress reactions, differentiate between acute and chronic stress, and recognize the impact that chronic stress has on their health. They will also be able to apply different coping mechanisms, such as reframing negative self-talk. Patients will have the opportunity to practice a variety of stress management techniques, such as deep abdominal breathing, progressive muscle relaxation, and/or guided imagery.  The purpose of this lesson is to educate patients on the role of stress in their lives and to provide healthy techniques for coping with it.  Learning Barriers/Preferences:  Learning Barriers/Preferences - 02/13/22 1525       Learning Barriers/Preferences   Learning Barriers Hearing    Learning Preferences Audio;Computer/Internet;Group Instruction;Individual Instruction;Pictoral;Skilled Demonstration;Verbal Instruction;Video;Written Material             Education Topics:  Knowledge Questionnaire Score:  Knowledge Questionnaire Score - 02/13/22 1526       Knowledge Questionnaire Score   Pre Score 24/24  Core Components/Risk Factors/Patient Goals at Admission:  Personal Goals and Risk Factors at Admission - 02/13/22 1526       Core Components/Risk Factors/Patient Goals on Admission    Weight Management Yes;Weight Loss    Intervention Weight  Management: Develop a combined nutrition and exercise program designed to reach desired caloric intake, while maintaining appropriate intake of nutrient and fiber, sodium and fats, and appropriate energy expenditure required for the weight goal.;Weight Management: Provide education and appropriate resources to help participant work on and attain dietary goals.;Weight Management/Obesity: Establish reasonable short term and long term weight goals.;Obesity: Provide education and appropriate resources to help participant work on and attain dietary goals.    Admit Weight 219 lb 11.2 oz (99.7 kg)    Goal Weight: Long Term 190 lb (86.2 kg)    Expected Outcomes Short Term: Continue to assess and modify interventions until short term weight is achieved;Long Term: Adherence to nutrition and physical activity/exercise program aimed toward attainment of established weight goal;Weight Loss: Understanding of general recommendations for a balanced deficit meal plan, which promotes 1-2 lb weight loss per week and includes a negative energy balance of [phone] kcal/d;Understanding recommendations for meals to include 15-35% energy as protein, 25-35% energy from fat, 35-60% energy from carbohydrates, less than '200mg'$  of dietary cholesterol, 20-35 gm of total fiber daily;Understanding of distribution of calorie intake throughout the day with the consumption of 4-5 meals/snacks    Hypertension Yes    Intervention Monitor prescription use compliance.;Provide education on lifestyle modifcations including regular physical activity/exercise, weight management, moderate sodium restriction and increased consumption of fresh fruit, vegetables, and low fat dairy, alcohol moderation, and smoking cessation.    Expected Outcomes Short Term: Continued assessment and intervention until BP is < 140/25m HG in hypertensive participants. < 130/855mHG in hypertensive participants with diabetes, heart failure or chronic kidney disease.;Long Term:  Maintenance of blood pressure at goal levels.    Lipids Yes    Intervention Provide education and support for participant on nutrition & aerobic/resistive exercise along with prescribed medications to achieve LDL '70mg'$ , HDL >'40mg'$ .    Expected Outcomes Short Term: Participant states understanding of desired cholesterol values and is compliant with medications prescribed. Participant is following exercise prescription and nutrition guidelines.;Long Term: Cholesterol controlled with medications as prescribed, with individualized exercise RX and with personalized nutrition plan. Value goals: LDL < '70mg'$ , HDL > 40 mg.    Personal Goal Other Yes    Personal Goal long and short the same: Flexibility, stamina and wt loss (goal 190 lbs), wants RD contult and more variety with food    Intervention Will continue to monitor pt and progress workloads as tolerated without sign or symptom    Expected Outcomes Pt will achieve his goals             Core Components/Risk Factors/Patient Goals Review:    Core Components/Risk Factors/Patient Goals at Discharge (Final Review):    ITP Comments:  ITP Comments     Row Name 02/13/22 1445           ITP Comments Dr TrLoreta AveD, Medical Director. Introduction to [facility]/Intensive cardiac rehab. Initial Orientation Packet Reviewed with the patient                Comments: Participant attended orientation for the cardiac rehabilitation program on  02/13/2022  to perform initial intake and exercise walk test. Patient introduced to the [facility] and orientation packet was reviewed. Completed 6-minute walk test, measurements, initial  ITP, and exercise prescription. Vital signs stable. Telemetry-normal sinus tach, max heart rate noted at 117. Resting heart rate in the low 100's.Mild chest discomfort reported and the end of walk test which resolved with  rest. On site provider Dr Rolla Etienne notified. Please see previous documentation.Harrell Gave RN BSN    Service time was from 1040 to 1252.

## 2022-02-17 ENCOUNTER — Encounter (HOSPITAL_COMMUNITY): Payer: Medicare Other

## 2022-02-19 ENCOUNTER — Encounter (HOSPITAL_COMMUNITY): Payer: Medicare Other

## 2022-02-20 ENCOUNTER — Ambulatory Visit (HOSPITAL_COMMUNITY)
Admission: RE | Admit: 2022-02-20 | Discharge: 2022-02-20 | Disposition: A | Discharge status: Home / Self Care | Payer: Medicare Other | Source: Ambulatory Visit | Attending: Thoracic Surgery (Cardiothoracic Vascular Surgery) | Admitting: Thoracic Surgery (Cardiothoracic Vascular Surgery)

## 2022-02-20 ENCOUNTER — Ambulatory Visit (INDEPENDENT_AMBULATORY_CARE_PROVIDER_SITE_OTHER): Payer: Self-pay | Admitting: Thoracic Surgery (Cardiothoracic Vascular Surgery)

## 2022-02-20 VITALS — BP 155/75 | HR 100 | Resp 20 | Ht 67.0 in | Wt 217.0 lb

## 2022-02-20 DIAGNOSIS — J90 Pleural effusion, not elsewhere classified: Secondary | ICD-10-CM

## 2022-02-20 DIAGNOSIS — Z951 Presence of aortocoronary bypass graft: Secondary | ICD-10-CM | POA: Diagnosis not present

## 2022-02-20 NOTE — Progress Notes (Signed)
[address]       ,[place] [address]             [phone]      HPI: [name] returns to discuss results of his CT chest.  Harvell "Justine Null" Berkebile is a 72 year old man with a history of CAD, MI, CABG, PCI, hypertension, hyperlipidemia, hypothyroidism, obesity, sleep apnea, and prostate cancer.  He presented with a non-ST elevation MI around Thanksgiving and had coronary bypass grafting x 3 on 12/06/2021.  Was noted to have elevated right hemidiaphragm on postoperative chest x-rays.  Saw him in the office on 02/11/2022 and he was still having some shortness of breath and orthopnea.  It was unclear from his chest x-ray if he had a pleural effusion or just atelectasis in the setting of the elevated diaphragm.  We did a CT to better evaluate.  Past Medical History:  Diagnosis Date   Cancer [facility])    prostate   Chest pain    Complication of anesthesia    fighting with waking up   Coronary artery disease    Heart attack [facility])    Hypercholesterolemia    Hyperlipidemia    Hypertension    Ischemic heart disease September 2004   Known--with non-Q-wave myocardial infarction    Obesity    Sleep apnea      Current Outpatient Medications  Medication Sig Dispense Refill   acetaminophen (TYLENOL) 500 MG tablet Take 2 tablets (1,000 mg total) by mouth every 6 (six) hours as needed. 30 tablet 0   aspirin EC 81 MG tablet Take 1 tablet (81 mg total) by mouth daily.     Cholecalciferol (VITAMIN D-3) 125 MCG (5000 UT) TABS Take 5,000 Units by mouth daily.      clopidogrel (PLAVIX) 75 MG tablet Take 1 tablet (75 mg total) by mouth daily. 30 tablet 3   Coenzyme Q10 (CVS COQ-10) 200 MG capsule Take 200 mg by mouth daily.     DHEA 25 MG CAPS Take 25 mg by mouth daily.      diltiazem (CARDIZEM SR) 120 MG 12 hr capsule Take 1 capsule (120 mg total) by mouth every 12 (twelve) hours. 60 capsule 3   ezetimibe (ZETIA) 10 MG tablet Take 1 tablet (10 mg total) by mouth daily. 30  tablet 3   furosemide (LASIX) 40 MG tablet Take 1 tablet (40 mg total) by mouth daily as needed. For weight gain of 3 lbs in 24 hours 30 tablet 3   loperamide (IMODIUM A-D) 2 MG tablet Take 1 tablet (2 mg total) by mouth 4 (four) times daily as needed for diarrhea or loose stools. 30 tablet 0   Magnesium Citrate POWD Take 1 Scoop by mouth daily as needed (regularity).     melatonin 5 MG TABS Take 5 mg by mouth at bedtime.     Menaquinone-7 (VITAMIN K2 PO) Take 150 mcg by mouth daily. MK-7     Multiple Vitamins-Minerals (IMMUNE SUPPORT PO) Take 520 mg by mouth daily. Vitac-ld Liposomal Vitamin     NON FORMULARY Take 1 capsule by mouth daily. HAIOTB Circulation Syn3rgy (Beet Root, L-Arginine, Horse Chestnut)     NP THYROID 60 MG tablet Take 60 mg by mouth every morning.     OVER THE COUNTER MEDICATION Take 2 capsules by mouth daily. Muscadine grape capsules - 650 mg each - take 2 capsules by mouth daily     potassium chloride (KLOR-CON M) 10 MEQ tablet Take 2  tablets (20 mEq total) by mouth daily as needed. Take when you take a Lasix (Furosemide) 60 tablet 3   Pregnenolone Micronized (PREGNENOLONE PO) Take 75 mg by mouth daily.     Probiotic Product (PROBIOTIC PO) Take 1 capsule by mouth daily. 20 billion     QUERCETIN PO Take 1 capsule by mouth daily. Bio-Quercetin     sildenafil (REVATIO) 20 MG tablet take 2 to 5 tablets by mouth 1 hour prior to intercourse 90 tablet 0   testosterone cypionate (DEPOTESTOSTERONE CYPIONATE) 200 MG/ML injection Inject 0.5 mLs into the skin once a week. Once a week     traMADol (ULTRAM) 50 MG tablet Take 1 tablet (50 mg total) by mouth every 4 (four) hours as needed for moderate pain. 30 tablet 0   traZODone (DESYREL) 50 MG tablet Take 0.5-1 tablets (25-50 mg total) by mouth at bedtime as needed for sleep. 30 tablet 0   Turmeric 500 MG TABS Take 500 mg by mouth daily.     UNABLE TO FIND Take 1 drop by mouth daily as needed. Med Name: Cortexi for tinnitus     Zinc 50  MG TABS Take 50 mg by mouth daily.     No current facility-administered medications for this visit.    Physical Exam BP (!) 155/75   Pulse 100   Resp 20   Ht 5' 7"$  (1.702 m)   Wt 217 lb (98.4 kg)   SpO2 97% Comment: RA  BMI 33.53 kg/m  72 year old man in no acute distress Alert and oriented x 3 with no focal deficits Well-developed and well-nourished  Diagnostic Tests: CT CHEST WITHOUT CONTRAST   TECHNIQUE: Multidetector CT imaging of the chest was performed following the standard protocol without IV contrast.   RADIATION DOSE REDUCTION: This exam was performed according to the departmental dose-optimization program which includes automated exposure control, adjustment of the mA and/or kV according to patient size and/or use of iterative reconstruction technique.   COMPARISON:  None Available.   FINDINGS: Cardiovascular: Status post median sternotomy and CABG. Normal heart size. Extensive three-vessel coronary artery calcifications and or stents. No pericardial effusion.   Mediastinum/Nodes: No enlarged mediastinal, hilar, or axillary lymph nodes. Thyroid gland, trachea, and esophagus demonstrate no significant findings.   Lungs/Pleura: Criss Rosales appearing, bandlike scarring or atelectasis primarily involving the right middle and right lower lobes (series 4, image 71). No pleural effusion or pneumothorax.   Upper Abdomen: No acute abnormality.  Hepatic steatosis.   Musculoskeletal: No chest wall abnormality. No acute osseous findings. Disc degenerative disease and ankylosis throughout the thoracic spine, in keeping with DISH.   IMPRESSION: 1. No pleural effusion. 2. Criss Rosales appearing, bandlike scarring or atelectasis primarily involving the right middle and right lower lobes. 3. Coronary artery disease. 4. Hepatic steatosis.     Electronically Signed   By: Delanna Ahmadi M.D.   On: 02/20/2022 11:24 I personally reviewed the CT images.  There is elevation of the  right hemidiaphragm with some atelectasis in the right lower and middle lobes.  No effusion or infiltrate.  Impression: Piere "Justine Null" Klemann is a 72 year old man with a history of CAD, MI, CABG, PCI, hypertension, hyperlipidemia, hypothyroidism, obesity, sleep apnea, and prostate cancer.  He presented with a non-ST elevation MI around Thanksgiving and had coronary bypass grafting x 3 on 12/06/2021.  Was noted to have elevated right hemidiaphragm on postoperative chest x-rays.  Overall doing extremely well post CABG.  No recurrent anginal symptoms.  Still has an elevated right  hemidiaphragm.  Better than it was in the early postoperative period, but still elevated compared to his preop chest x-ray.  Still mysterious as to why this occurred since we did not do a right mammary takedown.  It is possible it was related to the central line placement on that side..  I again counseled him that this usually will take on the order of 6 to 9 months to resolve.  Plan to see him back in again in a couple of months and get another chest x-ray might consider doing a sniff test at that time to see if there is any recovery of function.  Plan: Return in 2 months with PA and lateral chest x-ray, consider sniff test diaphragm still elevated  I spent more than 10 minutes in review of records, images, and in consultation with Mr. Renno today. [name], MD [facility] [phone]

## 2022-02-21 ENCOUNTER — Encounter (HOSPITAL_COMMUNITY): Payer: Medicare Other

## 2022-02-21 ENCOUNTER — Telehealth: Payer: Self-pay | Admitting: *Deleted

## 2022-02-21 NOTE — Telephone Encounter (Signed)
Per Enid Cutter, PA, patient is cleared to begin cardiac rehab as scheduled 2/19.

## 2022-02-24 ENCOUNTER — Encounter (HOSPITAL_COMMUNITY): Payer: Medicare Other

## 2022-02-24 ENCOUNTER — Telehealth (HOSPITAL_COMMUNITY): Payer: Self-pay | Admitting: *Deleted

## 2022-02-24 NOTE — Progress Notes (Unsigned)
Cardiology Office Note  Date:  02/25/2022   ID:  Keith Murillo, DOB 01/10/1950, MRN [id number]  PCP:  [name], NP   Chief Complaint  Patient presents with   2 month follow up     Patient c/o shortness of breath when lying flat; had a chest CT showing "no pleural effusion" per the cardiothoracic surgeon. Medications reviewed by the patient verbally.      HPI:  72 yo male with history of CAD s/p remote PCI to LAD and RCA (2004, 2009), (4 stents total)  HLD,  OSA not on CPAP prostate cancer, CAD, 2004 with NSTEMI, RCA stenting  cath in 2009 with PCI to the LAD Off statins "since 2010" Hx of CABG x 3 Who presents for f/u of his CAD.  Last seen in clinic by myself July 2023 non-ST elevation MI troponin 4200 around Thanksgiving November 2023  Normal LV function,  attempt circumflex intervention by Dr. Ellyn Hack which was unsuccessful. He does have an occluded RCA with left-to-right collaterals. He has 60 to 70% ostial LAD disease and significant calcified ostial circumflex disease, first obtuse marginal branch disease and AV groove circumflex disease just beyond the marginal takeoff. This supplies several small posterolateral branches.    had coronary bypass grafting x 3 on 12/06/2021. Was noted to have elevated right hemidiaphragm on postoperative chest x-rays.   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  EF by echo 60-65%. Crestor 24m daily (now discontinued 2/2 myalgias).    cough on ACEi   stress test in 07/2018.EF 56% and NRWMA.  small defect of mild severity noted in the mid inferolateral and apical inferior location and findings found consistent with a small area of apical inferolateral ischemia.   Lab Results  Component Value Date   CHOL 158 12/26/2021   HDL 51.30 12/26/2021   LDLCALC 84 12/26/2021   TRIG 111.0 12/26/2021      PMH:   has a past medical history of Cancer ([facility], Chest pain, Complication of anesthesia, Coronary artery disease, [place], Hypercholesterolemia, Hyperlipidemia, Hypertension, Ischemic heart disease (September 2004), Obesity, and Sleep apnea.  PSH:    Past Surgical History:  Procedure Laterality Date   CARDIAC CATHETERIZATION  11/2007   CORONARY ARTERY BYPASS GRAFT N/A 12/06/2021   Procedure: CORONARY ARTERY BYPASS GRAFTING (CABG) X 3 BYPASSES USING LEFT INTERNAL MAMMARY ARTERY AND RIGHT LEG GREATER SAPHENOUS VEIN HARVESTED ENDOSCOPICALLY;  Surgeon: HMelrose Nakayama MD;  [place] [facility]  Service: Open Heart Surgery;  Laterality: N/A;   INGUINAL HERNIA REPAIR Right 09/22/2019   Procedure: RIGHT INGUINAL HERNIA WITH MESH;  Surgeon: CErroll Luna MD;  [place]  Service: General;  Laterality: Right;  TAP BLOCK   LEFT HEART CATH AND CORONARY ANGIOGRAPHY N/A 11/29/2021   Procedure: LEFT HEART CATH AND CORONARY ANGIOGRAPHY;  Surgeon: HLeonie Man MD;  [place];  Service:  Cardiovascular;  Laterality: N/A;   TEE WITHOUT CARDIOVERSION N/A 03/16/2018   Procedure: TRANSESOPHAGEAL ECHOCARDIOGRAM (TEE);  Surgeon: Acie Fredrickson Wonda Cheng, MD;  Location: [place];  Service: Cardiovascular;  Laterality: N/A;   TEE WITHOUT CARDIOVERSION N/A 12/06/2021   Procedure: TRANSESOPHAGEAL ECHOCARDIOGRAM (TEE);  Surgeon:  [name], MD;  Location: [place];  Service: Open Heart Surgery;  Laterality: N/A;    Current Outpatient Medications  Medication Sig Dispense Refill   acetaminophen (TYLENOL) 500 MG tablet Take 2 tablets (1,000 mg total) by mouth every 6 (six) hours as needed. 30 tablet 0   aspirin EC 81 MG tablet Take 1 tablet (81 mg total) by mouth daily.     Cholecalciferol (VITAMIN D-3) 125 MCG (5000 UT) TABS Take 5,000 Units by mouth daily.      clopidogrel (PLAVIX) 75 MG tablet Take 1 tablet (75 mg total) by mouth daily. 30 tablet 3   Coenzyme Q10 (CVS COQ-10) 200 MG capsule Take 200 mg by mouth daily.     DHEA 25 MG CAPS Take 25 mg by mouth daily.      diltiazem (CARDIZEM SR) 120 MG 12 hr capsule Take 1 capsule (120 mg total) by mouth every 12 (twelve) hours. 60 capsule 3   ezetimibe (ZETIA) 10 MG tablet Take 1 tablet (10 mg total) by mouth daily. 30 tablet 3   furosemide (LASIX) 40 MG tablet Take 1 tablet (40 mg total) by mouth daily as needed. For weight gain of 3 lbs in 24 hours 30 tablet 3   loperamide (IMODIUM A-D) 2 MG tablet Take 1 tablet (2 mg total) by mouth 4 (four) times daily as needed for diarrhea or loose stools. 30 tablet 0   Magnesium Citrate POWD Take 1 Scoop by mouth daily as needed (regularity).     melatonin 5 MG TABS Take 5 mg by mouth at bedtime.     Menaquinone-7 (VITAMIN K2 PO) Take 150 mcg by mouth daily. MK-7     Multiple Vitamins-Minerals (IMMUNE SUPPORT PO) Take 520 mg by mouth daily. Vitac-ld Liposomal Vitamin     NON FORMULARY Take 1 capsule by mouth daily. HAIOTB Circulation Syn3rgy (Beet Root, L-Arginine, Horse Chestnut)     NP THYROID 60 MG tablet Take 60 mg by mouth every morning.     OVER THE COUNTER MEDICATION Take 2 capsules by mouth daily. Muscadine grape capsules - 650 mg each - take 2 capsules by mouth daily     potassium chloride (KLOR-CON M) 10 MEQ tablet Take 2 tablets (20 mEq total) by mouth daily as needed. Take when you take a Lasix (Furosemide) 60  tablet 3   Pregnenolone Micronized (PREGNENOLONE PO) Take 75 mg by mouth daily.     Probiotic Product (PROBIOTIC PO) Take 1 capsule by mouth daily. 20 billion     QUERCETIN PO Take 1 capsule by mouth daily. Bio-Quercetin     sildenafil (REVATIO) 20 MG tablet take 2 to 5 tablets by mouth 1 hour prior to intercourse 90 tablet 0   testosterone cypionate (DEPOTESTOSTERONE CYPIONATE) 200 MG/ML injection Inject 0.5 mLs into the skin once a week. Once a week     traMADol (ULTRAM) 50 MG tablet Take 1 tablet (50 mg total) by mouth every 4 (four) hours as needed for moderate pain. 30 tablet 0   traZODone (DESYREL) 50 MG tablet Take 0.5-1 tablets (25-50 mg total) by mouth at bedtime as needed for sleep. 30 tablet 0   Turmeric 500 MG TABS Take 500 mg by  mouth daily.     UNABLE TO FIND Take 1 drop by mouth daily as needed. Med Name: Cortexi for tinnitus     Zinc 50 MG TABS Take 50 mg by mouth daily.     No current facility-administered medications for this visit.    Allergies:   Ace inhibitors, Beta adrenergic blockers, Codeine, Lisinopril, and Statins   Social History:  The patient  reports that he has never smoked. He has never used smokeless tobacco. He reports current alcohol use. He reports that he does not use drugs.   Family History:   family history includes Cancer in his maternal grandmother and mother; Hearing loss in his father; Heart attack in his paternal grandfather; Parkinson's disease in his maternal grandfather; Stroke in his maternal grandfather and paternal grandmother.    Review of Systems: Review of Systems  Constitutional: Negative.   HENT: Negative.    Respiratory: Negative.    Cardiovascular: Negative.   Gastrointestinal: Negative.   Musculoskeletal: Negative.   Neurological: Negative.   Psychiatric/Behavioral: Negative.    All other systems reviewed and are negative.  PHYSICAL EXAM: VS:  BP 130/80 (BP Location: Left Arm, Patient Position: Sitting, Cuff Size: Normal)    Pulse 81   Ht 5' 7"$  (1.702 m)   Wt 219 lb 6 oz (99.5 kg)   SpO2 98%   BMI 34.36 kg/m  , BMI Body mass index is 34.36 kg/m. Constitutional:  oriented to person, place, and time. No distress.  HENT:  Head: Grossly normal Eyes:  no discharge. No scleral icterus.  Neck: No JVD, no carotid bruits  Cardiovascular: Regular rate and rhythm, no murmurs appreciated Pulmonary/Chest: Clear to auscultation bilaterally, no wheezes or rails Abdominal: Soft.  no distension.  no tenderness.  Musculoskeletal: Normal range of motion Neurological:  normal muscle tone. Coordination normal. No atrophy Skin: Skin warm and dry Psychiatric: normal affect, pleasant  Recent Labs: 12/07/2021: Magnesium 2.3 12/09/2021: BUN 28; Creatinine, Ser 1.06; Potassium 4.2; Sodium 133 12/26/2021: ALT 14; Hemoglobin 14.7; Platelets 499.0; TSH 1.27    Lipid Panel Lab Results  Component Value Date   CHOL 158 12/26/2021   HDL 51.30 12/26/2021   LDLCALC 84 12/26/2021   TRIG 111.0 12/26/2021      Wt Readings from Last 3 Encounters:  02/25/22 219 lb 6 oz (99.5 kg)  02/20/22 217 lb (98.4 kg)  02/13/22 219 lb 12.8 oz (99.7 kg)     ASSESSMENT AND PLAN:  Problem List Items Addressed This Visit       Cardiology Problems   Aortic atherosclerosis ([place])     Other   S/P CABG x 3   Other Visit Diagnoses     Coronary artery disease of native artery of native heart with stable angina pectoris ([place])    -  Primary   Essential hypertension       Hyperlipidemia LDL goal <70       OSA on CPAP       BMI 33.0-33.9,adult         Coronary artery disease with stable angina Recent CABG  prior stenting 2004, 2009 He does not take his Crestor since 2010 Previously took himself off beta-blockers secondary to fatigue Recommend he start Repatha 140 subcu every 2 weeks with his Zetia to achieve goal LDL less than 60  Sleep apnea not using his CPAP Previously reported symptoms better after weight loss Thinks that he might  retry his CPAP  History of prostate cancer Followed by urology, PSA  Sildenafil  refilled at his request  Obesity We have encouraged continued exercise, careful diet management in an effort to lose weight.  Hyperlipidemia Prefers not to be on a statin or prescription medication  continue Zetia, add PCSK9 inhibitor   Total encounter time more than 30 minutes  Greater than 50% was spent in counseling and coordination of care with the patient   Signed, Esmond Plants, M.D., Ph.D. [place], [place]

## 2022-02-24 NOTE — Telephone Encounter (Signed)
Message left on departmental voice mail.  Pt will be absent for cardiac rehab due to very bad cough and cold-like symptoms. Cherre Huger, BSN Cardiac and Training and development officer

## 2022-02-25 ENCOUNTER — Encounter: Payer: Self-pay | Admitting: Cardiovascular Disease

## 2022-02-25 ENCOUNTER — Ambulatory Visit: Payer: Medicare Other | Attending: Cardiovascular Disease | Admitting: Cardiovascular Disease

## 2022-02-25 VITALS — BP 130/80 | HR 81 | Ht 67.0 in | Wt 219.4 lb

## 2022-02-25 DIAGNOSIS — E785 Hyperlipidemia, unspecified: Secondary | ICD-10-CM

## 2022-02-25 DIAGNOSIS — I700 Atherosclerosis of aorta: Secondary | ICD-10-CM

## 2022-02-25 DIAGNOSIS — Z951 Presence of aortocoronary bypass graft: Secondary | ICD-10-CM

## 2022-02-25 DIAGNOSIS — G4733 Obstructive sleep apnea (adult) (pediatric): Secondary | ICD-10-CM

## 2022-02-25 DIAGNOSIS — I10 Essential (primary) hypertension: Secondary | ICD-10-CM

## 2022-02-25 DIAGNOSIS — I25118 Atherosclerotic heart disease of native coronary artery with other forms of angina pectoris: Secondary | ICD-10-CM | POA: Diagnosis not present

## 2022-02-25 DIAGNOSIS — Z6833 Body mass index (BMI) 33.0-33.9, adult: Secondary | ICD-10-CM

## 2022-02-25 MED ORDER — REPATHA SURECLICK 140 MG/ML ~~LOC~~ SOAJ: 140.0000 mg | SUBCUTANEOUS | 5 refills | Status: DC

## 2022-02-25 MED ORDER — SILDENAFIL CITRATE 20 MG PO TABS: ORAL_TABLET | ORAL | 1 refills | Status: DC

## 2022-02-25 NOTE — Patient Instructions (Addendum)
Medication Instructions:  Repatha 140 mg sq every 2 weeks  If you need a refill on your cardiac medications before your next appointment, please call your pharmacy.   Lab work: No new labs needed  Testing/Procedures: No new testing needed  Follow-Up: At [facility], you and your health needs are our priority.  As part of our continuing mission to provide you with exceptional heart care, we have created designated Provider Care Teams.  These Care Teams include your primary Cardiologist (physician) and Advanced Practice Providers (APPs -  Physician Assistants and Nurse Practitioners) who all work together to provide you with the care you need, when you need it.  You will need a follow up appointment in 6 months  Providers on your designated Care Team:   Murray Hodgkins, NP Christell Faith, PA-C Cadence Kathlen Mody, [place]  COVID-19 Vaccine Information can be found at: ShippingScam.co.uk For questions related to vaccine distribution or appointments, please email [email or web address] or call [phone].

## 2022-02-26 ENCOUNTER — Encounter (HOSPITAL_COMMUNITY)
Admission: RE | Admit: 2022-02-26 | Discharge: 2022-02-26 | Disposition: A | Discharge status: Home / Self Care | Payer: Medicare Other | Source: Ambulatory Visit | Attending: Cardiovascular Disease | Admitting: Cardiovascular Disease

## 2022-02-26 DIAGNOSIS — Z951 Presence of aortocoronary bypass graft: Secondary | ICD-10-CM | POA: Diagnosis not present

## 2022-02-26 NOTE — Progress Notes (Signed)
Daily Session Note  Patient Details  Name: Keith Murillo MRN: [id number] Date of Birth: [birth year] Referring Provider:   Flowsheet Row INTENSIVE CARDIAC REHAB ORIENT from 02/13/2022 in [facility]  Referring Provider Dr. Ida Rogue, MD       Encounter Date: 02/26/2022  Check In:  Session Check In - 02/26/22 1045       Check-In   Supervising physician immediately available to respond to emergencies [facility] - Physician supervision    Physician(s) Christen Bame, NP    Location [facility]    Staff Present Esmeralda Links BS, ACSM-CEP, Exercise Physiologist;Jorian Willhoite, RN, Deland Pretty, MS, ACSM-CEP, Exercise Physiologist;David Lilyan Punt, MS, ACSM-CEP, CCRP, Exercise Physiologist;Kaylee Rosana Hoes, MS, ACSM-CEP, Exercise Physiologist;Johnny Starleen Blue, MS, Exercise Physiologist    Virtual Visit No    Medication changes reported     No    Fall or balance concerns reported    No    Tobacco Cessation No Change    Current number of cigarettes/nicotine per day     0    Warm-up and Cool-down Performed as group-led instruction   Cardiac Rehab Orientation   Resistance Training Performed No    VAD Patient? No    PAD/SET Patient? No      Pain Assessment   Currently in Pain? No/denies    Pain Score 0-No pain    Multiple Pain Sites No             Capillary Blood Glucose: No results found for this or any previous visit (from the past 24 hour(s)).   Exercise Prescription Changes - 02/26/22 1021       Response to Exercise   Blood Pressure (Admit) 122/68    Blood Pressure (Exercise) 146/78    Blood Pressure (Exit) 124/78    Heart Rate (Admit) 92 bpm    Heart Rate (Exercise) 104 bpm    Heart Rate (Exit) 88 bpm    Rating of Perceived Exertion (Exercise) 13    Symptoms None    Comments Off to a good start with exercise.    Duration Continue with 30 min of aerobic exercise without signs/symptoms of  physical distress.    Intensity THRR unchanged      Progression   Progression Continue to progress workloads to maintain intensity without signs/symptoms of physical distress.    Average METs 2.1      Resistance Training   Training Prescription No   Relaxation day, no weights     Interval Training   Interval Training No      Bike   Level 2    Watts 19    Minutes 15    METs 2.5      NuStep   Level 2    SPM 67    Minutes 15    METs 1.8             Social History   Tobacco Use  Smoking Status Never  Smokeless Tobacco Never    Goals Met:  Exercise tolerated well No report of concerns or symptoms today  Goals Unmet:  Not Applicable  Comments: Pt started cardiac rehab today.  Pt tolerated light exercise without difficulty. VSS, telemetry-Sinus Rhythm, asymptomatic.  Medication list reconciled. Pt denies barriers to medicaiton compliance.  PSYCHOSOCIAL ASSESSMENT:  PHQ-1. Pt exhibits positive coping skills, hopeful outlook with supportive family. No psychosocial needs identified at this time, no psychosocial interventions necessary.    Pt enjoys spending time  at his [place] and with his grandchildren.   Pt oriented to exercise equipment and routine.    Understanding verbalized. Harrell Gave RN BSN    Dr. Fransico Him is Medical Director for Cardiac Rehab at [facility].

## 2022-02-28 ENCOUNTER — Encounter (HOSPITAL_COMMUNITY)
Admission: RE | Admit: 2022-02-28 | Discharge: 2022-02-28 | Disposition: A | Discharge status: Home / Self Care | Payer: Medicare Other | Source: Ambulatory Visit | Attending: Cardiovascular Disease | Admitting: Cardiovascular Disease

## 2022-02-28 DIAGNOSIS — Z951 Presence of aortocoronary bypass graft: Secondary | ICD-10-CM

## 2022-02-28 NOTE — Progress Notes (Signed)
QUALITY OF LIFE SCORE REVIEW  Pt completed Quality of Life survey as a participant in Cardiac Rehab.  Scores 21.0 or below are considered low.  Pt score very low in several areas Overall 21.01, Health and Function 18.10, socioeconomic 22.14, physiological and spiritual 20.57, family 28.80. Patient quality of life slightly altered by physical constraints which limits ability to perform as prior to recent cardiac illness.Rowe reports that his energy level and shortness of breath is improving. Justine Null is feeling better and denies being short of breath.  Offered emotional support and reassurance.  Will continue to monitor and intervene as necessary. Harrell Gave RN BSN

## 2022-02-28 NOTE — Progress Notes (Signed)
Cardiac Individual Treatment Plan  Patient Details  Name: Keith Murillo MRN: [id number] Date of Birth: 09/25/1950 Referring Provider:   Flowsheet Row INTENSIVE CARDIAC REHAB ORIENT from 02/13/2022 in [facility]  Referring Provider Dr. Ida Rogue, MD       Initial Encounter Date:  [place] from 02/13/2022 in [facility]  Date 02/13/22       Visit Diagnosis: 12/06/21 S/P CABG x 3  Patient's Home Medications on Admission:  Current Outpatient Medications:    acetaminophen (TYLENOL) 500 MG tablet, Take 2 tablets (1,000 mg total) by mouth every 6 (six) hours as needed., Disp: 30 tablet, Rfl: 0   aspirin EC 81 MG tablet, Take 1 tablet (81 mg total) by mouth daily., Disp: , Rfl:    Cholecalciferol (VITAMIN D-3) 125 MCG (5000 UT) TABS, Take 5,000 Units by mouth daily. , Disp: , Rfl:    clopidogrel (PLAVIX) 75 MG tablet, Take 1 tablet (75 mg total) by mouth daily., Disp: 30 tablet, Rfl: 3   Coenzyme Q10 ([employer] COQ-10) 200 MG capsule, Take 200 mg by mouth daily., Disp: , Rfl:    DHEA 25 MG CAPS, Take 25 mg by mouth daily. , Disp: , Rfl:    diltiazem (CARDIZEM SR) 120 MG 12 hr capsule, Take 1 capsule (120 mg total) by mouth every 12 (twelve) hours., Disp: 60 capsule, Rfl: 3   Evolocumab (REPATHA SURECLICK) [id number] MG/ML SOAJ, Inject 140 mg into the skin every 14 (fourteen) days., Disp: 2 mL, Rfl: 5   ezetimibe (ZETIA) 10 MG tablet, Take 1 tablet (10 mg total) by mouth daily., Disp: 30 tablet, Rfl: 3   furosemide (LASIX) 40 MG tablet, Take 1 tablet (40 mg total) by mouth daily as needed. For weight gain of 3 lbs in 24 hours, Disp: 30 tablet, Rfl: 3   loperamide (IMODIUM A-D) 2 MG tablet, Take 1 tablet (2 mg total) by mouth 4 (four) times daily as needed for diarrhea or loose stools., Disp: 30 tablet, Rfl: 0   Magnesium Citrate POWD, Take 1  [place] by mouth daily as needed (regularity)., Disp: , Rfl:    melatonin 5 MG TABS, Take 5 mg by mouth at bedtime., Disp: , Rfl:    Menaquinone-7 (VITAMIN K2 PO), Take 150 mcg by mouth daily. MK-7, Disp: , Rfl:    Multiple Vitamins-Minerals (IMMUNE SUPPORT PO), Take 520 mg by mouth daily. Vitac-ld Liposomal Vitamin, Disp: , Rfl:    NON FORMULARY, Take 1 capsule by mouth daily. HAIOTB Circulation Syn3rgy (Beet Root, L-Arginine, Horse Chestnut), Disp: , Rfl:    NP THYROID 60 MG tablet, Take 60 mg by mouth every morning., Disp: , Rfl:    OVER THE COUNTER MEDICATION, Take 2 capsules by mouth daily. [place] grape capsules - 650 mg each - take 2 capsules by mouth daily, Disp: , Rfl:    potassium chloride (KLOR-CON M) 10 MEQ tablet, Take 2 tablets (20 mEq total) by mouth daily as needed. Take when you take a Lasix (Furosemide), Disp: 60 tablet, Rfl: 3   Pregnenolone Micronized (PREGNENOLONE PO), Take 75 mg by mouth daily., Disp: , Rfl:    Probiotic Product (PROBIOTIC PO), Take 1 capsule by mouth daily. 20 billion, Disp: , Rfl:    QUERCETIN PO, Take 1 capsule by mouth daily. Bio-Quercetin, Disp: , Rfl:    sildenafil (REVATIO) 20 MG tablet, take 2 to 5  tablets by mouth 1 hour prior to intercourse, Disp: 90 tablet, Rfl: 1   testosterone cypionate (DEPOTESTOSTERONE CYPIONATE) 200 MG/ML injection, Inject 0.5 mLs into the skin once a week. Once a week, Disp: , Rfl:    traMADol (ULTRAM) 50 MG tablet, Take 1 tablet (50 mg total) by mouth every 4 (four) hours as needed for moderate pain., Disp: 30 tablet, Rfl: 0   traZODone (DESYREL) 50 MG tablet, Take 0.5-1 tablets (25-50 mg total) by mouth at bedtime as needed for sleep., Disp: 30 tablet, Rfl: 0   Turmeric 500 MG TABS, Take 500 mg by mouth daily., Disp: , Rfl:    UNABLE TO FIND, Take 1 drop by mouth daily as needed. Med Name: Cortexi for tinnitus, Disp: , Rfl:    Zinc 50 MG TABS, Take 50 mg by mouth daily., Disp: , Rfl:   Past Medical History: Past Medical  History:  Diagnosis Date   Cancer ([place])    prostate   Chest pain    Complication of anesthesia    fighting with waking up   Coronary artery disease    Heart attack ([place])    Hypercholesterolemia    Hyperlipidemia    Hypertension    Ischemic heart disease September 2004   Known--with non-Q-wave myocardial infarction    Obesity    Sleep apnea     Tobacco Use: Social History   Tobacco Use  Smoking Status Never  Smokeless Tobacco Never    Labs: Review Flowsheet  More data exists      Latest Ref Rng & Units 07/30/2021 11/28/2021 12/05/2021 12/06/2021 12/26/2021  Labs for ITP Cardiac and Pulmonary Rehab  Cholestrol 0 - 200 mg/dL 186  179  - - 158   LDL (calc) 0 - 99 mg/dL 114  82  - - 84   HDL-C >39.00 mg/dL 47  33  - - 51.30   Trlycerides 0.0 - 149.0 mg/dL 142  322  - - 111.0   Hemoglobin A1c 4.8 - 5.6 % - 5.7  - - -  PH, Arterial 7.35 - 7.45 - - 7.43  7.322  7.327  7.319  7.206  7.284  7.330  -  PCO2 arterial 32 - 48 mmHg - - 33  38.1  42.1  39.8  59.9  48.2  46.2  -  Bicarbonate 20.0 - 28.0 mmol/L - - 21.9  19.5  21.8  20.3  23.8  22.9  23.8  24.3  -  TCO2 22 - 32 mmol/L - - - '21  23  21  26  22  24  25  23  25  26  24  26  '$ -  Acid-base deficit 0.0 - 2.0 mmol/L - - 1.7  6.0  4.0  5.0  5.0  4.0  3.0  2.0  -  O2 Saturation % - - 98.2  95  97  99  86  98  81  100  -    Capillary Blood Glucose: Lab Results  Component Value Date   GLUCAP 111 (H) 12/09/2021   GLUCAP 113 (H) 12/09/2021   GLUCAP 179 (H) 12/09/2021   GLUCAP 131 (H) 12/09/2021   GLUCAP 126 (H) 12/09/2021     Exercise Target Goals: Exercise Program Goal: Individual exercise prescription set using results from initial 6 min walk test and THRR while considering  patient's activity barriers and safety.   Exercise Prescription Goal: Initial exercise prescription builds to 30-45 minutes a day of aerobic activity, 2-3 days per week.  Home exercise guidelines will be given to patient during program as part of  exercise prescription that the participant will acknowledge.  Activity Barriers & Risk Stratification:  Activity Barriers & Cardiac Risk Stratification - 02/13/22 1512       Activity Barriers & Cardiac Risk Stratification   Activity Barriers Shortness of Breath;Balance Concerns    Cardiac Risk Stratification High   under 5 MET's on 6MWT            6 Minute Walk:  6 Minute Walk     Row Name 02/13/22 1510         6 Minute Walk   Phase Initial     Distance 1530 feet     Walk Time 6 minutes     # of Rest Breaks 0     MPH 2.9     METS 3.37     RPE 11     Perceived Dyspnea  0     VO2 Peak 11.78     Symptoms Yes (comment)     Comments Mild chest pressure post walk test, resolved with rest,     Resting HR 105 bpm     Resting BP 124/76     Resting Oxygen Saturation  97 %     Exercise Oxygen Saturation  during 6 min walk 97 %     Max Ex. HR 117 bpm     Max Ex. BP 158/90     2 Minute Post BP 138/82              Oxygen Initial Assessment:   Oxygen Re-Evaluation:   Oxygen Discharge (Final Oxygen Re-Evaluation):   Initial Exercise Prescription:  Initial Exercise Prescription - 02/13/22 1500       Date of Initial Exercise RX and Referring Provider   Date 02/13/22    Referring Provider Dr. Ida Rogue, MD    Expected Discharge Date 04/25/22      Bike   Level 2    Minutes 15    METs 2.3      NuStep   Level 2    SPM 85    Minutes 15    METs 2.3      Prescription Details   Frequency (times per week) 3    Duration Progress to 30 minutes of continuous aerobic without signs/symptoms of physical distress      Intensity   THRR 40-80% of Max Heartrate 60-120    Ratings of Perceived Exertion 11-13    Perceived Dyspnea 0-4      Progression   Progression Continue progressive overload as per policy without signs/symptoms or physical distress.      Resistance Training   Training Prescription Yes    Weight 4    Reps 10-15             Perform  Capillary Blood Glucose checks as needed.  Exercise Prescription Changes:   Exercise Prescription Changes     Row Name 02/26/22 1021             Response to Exercise   Blood Pressure (Admit) 122/68       Blood Pressure (Exercise) 146/78       Blood Pressure (Exit) 124/78       Heart Rate (Admit) 92 bpm       Heart Rate (Exercise) 104 bpm       Heart Rate (Exit) 88 bpm       Rating of Perceived Exertion (Exercise) 13  Symptoms None       Comments Off to a good start with exercise.       Duration Continue with 30 min of aerobic exercise without signs/symptoms of physical distress.       Intensity THRR unchanged         Progression   Progression Continue to progress workloads to maintain intensity without signs/symptoms of physical distress.       Average METs 2.1         Resistance Training   Training Prescription No  Relaxation day, no weights         Interval Training   Interval Training No         Bike   Level 2       Watts 19       Minutes 15       METs 2.5         NuStep   Level 2       SPM 67       Minutes 15       METs 1.8                Exercise Comments:   Exercise Comments     Row Name 02/26/22 1123           Exercise Comments Patient tolerated first session of exercise well without symptoms.                Exercise Goals and Review:   Exercise Goals     Row Name 02/13/22 1521             Exercise Goals   Increase Physical Activity Yes       Intervention Provide advice, education, support and counseling about physical activity/exercise needs.;Develop an individualized exercise prescription for aerobic and resistive training based on initial evaluation findings, risk stratification, comorbidities and participant's personal goals.       Expected Outcomes Short Term: Attend rehab on a regular basis to increase amount of physical activity.;Long Term: Exercising regularly at least 3-5 days a week.;Long Term: Add in home  exercise to make exercise part of routine and to increase amount of physical activity.       Increase Strength and Stamina Yes       Intervention Provide advice, education, support and counseling about physical activity/exercise needs.;Develop an individualized exercise prescription for aerobic and resistive training based on initial evaluation findings, risk stratification, comorbidities and participant's personal goals.       Expected Outcomes Short Term: Increase workloads from initial exercise prescription for resistance, speed, and METs.;Short Term: Perform resistance training exercises routinely during rehab and add in resistance training at home;Long Term: Improve cardiorespiratory fitness, muscular endurance and strength as measured by increased METs and functional capacity (6MWT)       Able to understand and use rate of perceived exertion (RPE) scale Yes       Intervention Provide education and explanation on how to use RPE scale       Expected Outcomes Short Term: Able to use RPE daily in rehab to express subjective intensity level;Long Term:  Able to use RPE to guide intensity level when exercising independently       Knowledge and understanding of Target Heart Rate Range (THRR) Yes       Intervention Provide education and explanation of THRR including how the numbers were predicted and where they are located for reference       Expected Outcomes Short Term: Able  to state/look up THRR;Long Term: Able to use THRR to govern intensity when exercising independently;Short Term: Able to use daily as guideline for intensity in rehab       Understanding of Exercise Prescription Yes       Intervention Provide education, explanation, and written materials on patient's individual exercise prescription       Expected Outcomes Short Term: Able to explain program exercise prescription;Long Term: Able to explain home exercise prescription to exercise independently                Exercise Goals  Re-Evaluation :  Exercise Goals Re-Evaluation     [place] Name 02/26/22 1123             Exercise Goal Re-Evaluation   Exercise Goals Review Increase Physical Activity;Able to understand and use rate of perceived exertion (RPE) scale       Comments Patient able to understand and use RPE scale appropriately.       Expected Outcomes Progress workloads as tolerated to help increase cardiorespiratory fitness.                Discharge Exercise Prescription (Final Exercise Prescription Changes):  Exercise Prescription Changes - 02/26/22 1021       Response to Exercise   Blood Pressure (Admit) 122/68    Blood Pressure (Exercise) 146/78    Blood Pressure (Exit) 124/78    Heart Rate (Admit) 92 bpm    Heart Rate (Exercise) 104 bpm    Heart Rate (Exit) 88 bpm    Rating of Perceived Exertion (Exercise) 13    Symptoms None    Comments Off to a good start with exercise.    Duration Continue with 30 min of aerobic exercise without signs/symptoms of physical distress.    Intensity THRR unchanged      Progression   Progression Continue to progress workloads to maintain intensity without signs/symptoms of physical distress.    Average METs 2.1      Resistance Training   Training Prescription No   Relaxation day, no weights     Interval Training   Interval Training No      Bike   Level 2    Watts 19    Minutes 15    METs 2.5      NuStep   Level 2    SPM 67    Minutes 15    METs 1.8             Nutrition:  Target Goals: Understanding of nutrition guidelines, daily intake of sodium '1500mg'$ , cholesterol '200mg'$ , calories 30% from fat and 7% or less from saturated fats, daily to have 5 or more servings of fruits and vegetables.  Biometrics:  Pre Biometrics - 02/13/22 1521       Pre Biometrics   Waist Circumference 43 inches    Hip Circumference 44 inches    Waist to Hip Ratio 0.98 %    Triceps Skinfold 14 mm    % Body Fat 30.9 %    Grip Strength 42 kg    Flexibility  --   pt unable to reach   Single Leg Stand 6.7 seconds              Nutrition Therapy Plan and Nutrition Goals:  Nutrition Therapy & Goals - 02/26/22 1603       Nutrition Therapy   Diet Heart Healthy Diet      Personal Nutrition Goals   Nutrition Goal Patient to identify strategies for  reducing cardiovascular risk by attending the weekly Pritikin education and nutrition series    Personal Goal #2 Patient to improve diet quality by using the plate method as a daily guide for meal planning to include lean protein/plant protein, fruits, vegetables, whole grains, nonfat dairy as part of well balanced diet    Comments Lipids improved but LDL is not at goal (<55). It was recommended to add PCSK9 inhibitor at cardiology follow-up on 02/25/22; he is not currently on a statin per documentation. Justine Null will benefit from participation in intensive cardiac rehab for nutrition, exercise, and lifestyle modification.      Intervention Plan   Intervention Prescribe, educate and counsel regarding individualized specific dietary modifications aiming towards targeted core components such as weight, hypertension, lipid management, diabetes, heart failure and other comorbidities.;Nutrition handout(s) given to patient.    Expected Outcomes Short Term Goal: Understand basic principles of dietary content, such as calories, fat, sodium, cholesterol and nutrients.;Long Term Goal: Adherence to prescribed nutrition plan.             Nutrition Assessments:  MEDIFICTS Score Key: ?70 Need to make dietary changes  40-70 Heart Healthy Diet ? 40 Therapeutic Level Cholesterol Diet    Picture Your Plate Scores: [id number] Unhealthy dietary pattern with much room for improvement. 41-50 Dietary pattern unlikely to meet recommendations for good health and room for improvement. 51-60 More healthful dietary pattern, with some room for improvement.  >60 Healthy dietary pattern, although there may be some specific behaviors  that could be improved.    Nutrition Goals Re-Evaluation:  Nutrition Goals Re-Evaluation     [place] Name 02/26/22 1603             Goals   Current Weight 220 lb 3.8 oz (99.9 kg)       Comment lipids improved- LDL 84, A1c 5.7       Expected Outcome Lipids improved but LDL is not at goal (<55). It was recommended to add PCSK9 inhibitor at cardiology follow-up on 02/25/22; he is not currently on a statin per documentation. Justine Null will benefit from participation in intensive cardiac rehab for nutrition, exercise, and lifestyle modification.                Nutrition Goals Re-Evaluation:  Nutrition Goals Re-Evaluation     [place] Name 02/26/22 1603             Goals   Current Weight 220 lb 3.8 oz (99.9 kg)       Comment lipids improved- LDL 84, A1c 5.7       Expected Outcome Lipids improved but LDL is not at goal (<55). It was recommended to add PCSK9 inhibitor at cardiology follow-up on 02/25/22; he is not currently on a statin per documentation. Justine Null will benefit from participation in intensive cardiac rehab for nutrition, exercise, and lifestyle modification.                Nutrition Goals Discharge (Final Nutrition Goals Re-Evaluation):  Nutrition Goals Re-Evaluation - 02/26/22 1603       Goals   Current Weight 220 lb 3.8 oz (99.9 kg)    Comment lipids improved- LDL 84, A1c 5.7    Expected Outcome Lipids improved but LDL is not at goal (<55). It was recommended to add PCSK9 inhibitor at cardiology follow-up on 02/25/22; he is not currently on a statin per documentation. Justine Null will benefit from participation in intensive cardiac rehab for nutrition, exercise, and lifestyle modification.  Psychosocial: Target Goals: Acknowledge presence or absence of significant depression and/or stress, maximize coping skills, provide positive support system. Participant is able to verbalize types and ability to use techniques and skills needed for reducing stress and  depression.  Initial Review & Psychosocial Screening:  Initial Psych Review & Screening - 02/13/22 1448       Initial Review   Current issues with None Identified      Family Dynamics   Good Support System? Yes      Barriers   Psychosocial barriers to participate in program There are no identifiable barriers or psychosocial needs.      Screening Interventions   Interventions Encouraged to exercise             Quality of Life Scores:  Quality of Life - 02/13/22 1524       Quality of Life   Select Quality of Life      Quality of Life Scores   Health/Function Pre 18.1 %    Socioeconomic Pre 22.14 %    Psych/Spiritual Pre 20.57 %    Family Pre 28.8 %    GLOBAL Pre 21.01 %            Scores of 19 and below usually indicate a poorer quality of life in these areas.  A difference of  2-3 points is a clinically meaningful difference.  A difference of 2-3 points in the total score of the Quality of Life Index has been associated with significant improvement in overall quality of life, self-image, physical symptoms, and general health in studies assessing change in quality of life.  PHQ-9: Review Flowsheet       02/13/2022 12/26/2021 01/30/2017 10/31/2016  Depression screen PHQ 2/9  Decreased Interest 0 0 0 0  Down, Depressed, Hopeless 0 0 0 0  PHQ - 2 Score 0 0 0 0  Altered sleeping 0 - - -  Tired, decreased energy 1 - - -  Change in appetite 0 - - -  Feeling bad or failure about yourself  0 - - -  Trouble concentrating 0 - - -  Moving slowly or fidgety/restless 0 - - -  Suicidal thoughts 0 - - -  PHQ-9 Score 1 - - -  Difficult doing work/chores Not difficult at all - - -   Interpretation of Total Score  Total Score Depression Severity:  1-4 = Minimal depression, 5-9 = Mild depression, 10-14 = Moderate depression, 15-19 = Moderately severe depression, 20-27 = Severe depression   Psychosocial Evaluation and Intervention:   Psychosocial Re-Evaluation:   Psychosocial Re-Evaluation     [place] Name 02/26/22 1448             Psychosocial Re-Evaluation   Current issues with None Identified       Interventions Stress management education;Relaxation education;Encouraged to attend Cardiac Rehabilitation for the exercise       Continue Psychosocial Services  No Follow up required                Psychosocial Discharge (Final Psychosocial Re-Evaluation):  Psychosocial Re-Evaluation - 02/26/22 1448       Psychosocial Re-Evaluation   Current issues with None Identified    Interventions Stress management education;Relaxation education;Encouraged to attend Cardiac Rehabilitation for the exercise    Continue Psychosocial Services  No Follow up required             Vocational Rehabilitation: Provide vocational rehab assistance to qualifying candidates.   Vocational Rehab Evaluation & Intervention:  Vocational Rehab - 02/13/22 1459       Initial Vocational Rehab Evaluation & Intervention   Assessment shows need for Vocational Rehabilitation No   Justine Null is retired and does not need vocational rehab at this time.            Education: Education Goals: Education classes will be provided on a weekly basis, covering required topics. Participant will state understanding/return demonstration of topics presented.    Education     Row Name 02/26/22 1215     Education   Cardiac Education Topics [employer]   Educator Dietitian   Weekly Topic Fast Evening Meals   Instruction Review Code 1- Verbalizes Understanding   Class Start Time 1140   Class Stop Time 1215   Class Time Calculation (min) 35 min            Core Videos: Exercise    Move It!  Clinical staff conducted group or individual video education with verbal and written material and guidebook.  Patient learns the recommended Pritikin exercise program. Exercise with the goal of living a long, healthy life. Some of the health  benefits of exercise include controlled diabetes, healthier blood pressure levels, improved cholesterol levels, improved heart and lung capacity, improved sleep, and better body composition. Everyone should speak with their doctor before starting or changing an exercise routine.  Biomechanical Limitations Clinical staff conducted group or individual video education with verbal and written material and guidebook.  Patient learns how biomechanical limitations can impact exercise and how we can mitigate and possibly overcome limitations to have an impactful and balanced exercise routine.  Body Composition Clinical staff conducted group or individual video education with verbal and written material and guidebook.  Patient learns that body composition (ratio of muscle mass to fat mass) is a key component to assessing overall fitness, rather than body weight alone. Increased fat mass, especially visceral belly fat, can put [place] at increased risk for metabolic syndrome, type 2 diabetes, heart disease, and even death. It is recommended to combine diet and exercise (cardiovascular and resistance training) to improve your body composition. Seek guidance from your physician and exercise physiologist before implementing an exercise routine.  Exercise Action Plan Clinical staff conducted group or individual video education with verbal and written material and guidebook.  Patient learns the recommended strategies to achieve and enjoy long-term exercise adherence, including variety, self-motivation, self-efficacy, and positive decision making. Benefits of exercise include fitness, good health, weight management, more energy, better sleep, less stress, and overall well-being.  Medical   Heart Disease Risk Reduction Clinical staff conducted group or individual video education with verbal and written material and guidebook.  Patient learns our heart is our most vital organ as it circulates oxygen, nutrients, white  blood cells, and hormones throughout the entire body, and carries waste away. Data supports a plant-based eating plan like the Pritikin Program for its effectiveness in slowing progression of and reversing heart disease. The video provides a number of recommendations to address heart disease.   Metabolic Syndrome and Belly Fat  Clinical staff conducted group or individual video education with verbal and written material and guidebook.  Patient learns what metabolic syndrome is, how it leads to heart disease, and how one can reverse it and keep it from coming back. You have metabolic syndrome if you have 3 of the following 5 criteria: abdominal obesity, high blood pressure, high triglycerides, low HDL cholesterol, and high blood  sugar.  Hypertension and Heart Disease Clinical staff conducted group or individual video education with verbal and written material and guidebook.  Patient learns that high blood pressure, or hypertension, is very common in the [place]. Hypertension is largely due to excessive salt intake, but other important risk factors include being overweight, physical inactivity, drinking too much alcohol, smoking, and not eating enough potassium from fruits and vegetables. High blood pressure is a leading risk factor for heart attack, stroke, congestive heart failure, dementia, kidney failure, and premature death. Long-term effects of excessive salt intake include stiffening of the arteries and thickening of heart muscle and organ damage. Recommendations include ways to reduce hypertension and the risk of heart disease.  Diseases of Our Time - Focusing on Diabetes Clinical staff conducted group or individual video education with verbal and written material and guidebook.  Patient learns why the best way to stop diseases of our time is prevention, through food and other lifestyle changes. Medicine (such as prescription pills and surgeries) is often only a Band-Aid on the problem, not a  long-term solution. Most common diseases of our time include obesity, type 2 diabetes, hypertension, heart disease, and cancer. The Pritikin Program is recommended and has been proven to help reduce, reverse, and/or prevent the damaging effects of metabolic syndrome.  Nutrition   Overview of the Pritikin Eating Plan  Clinical staff conducted group or individual video education with verbal and written material and guidebook.  Patient learns about the [place] for disease risk reduction. The [place] emphasizes a wide variety of unrefined, minimally-processed carbohydrates, like fruits, vegetables, whole grains, and legumes. Go, Caution, and Stop food choices are explained. Plant-based and lean animal proteins are emphasized. Rationale provided for low sodium intake for blood pressure control, low added sugars for blood sugar stabilization, and low added fats and oils for coronary artery disease risk reduction and weight management.  Calorie Density  Clinical staff conducted group or individual video education with verbal and written material and guidebook.  Patient learns about calorie density and how it impacts the Pritikin Eating Plan. Knowing the characteristics of the food you choose will help you decide whether those foods will lead to weight gain or weight loss, and whether you want to consume more or less of them. Weight loss is usually a side effect of the Pritikin Eating Plan because of its focus on low calorie-dense foods.  Label Reading  Clinical staff conducted group or individual video education with verbal and written material and guidebook.  Patient learns about the Pritikin recommended label reading guidelines and corresponding recommendations regarding calorie density, added sugars, sodium content, and whole grains.  Dining Out - Part 1  Clinical staff conducted group or individual video education with verbal and written material and guidebook.  Patient learns  that restaurant meals can be sabotaging because they can be so high in calories, fat, sodium, and/or sugar. Patient learns recommended strategies on how to positively address this and avoid unhealthy pitfalls.  Facts on Fats  Clinical staff conducted group or individual video education with verbal and written material and guidebook.  Patient learns that lifestyle modifications can be just as effective, if not more so, as many medications for lowering your risk of heart disease. A Pritikin lifestyle can help to reduce your risk of inflammation and atherosclerosis (cholesterol build-up, or plaque, in the artery walls). Lifestyle interventions such as dietary choices and physical activity address the cause of atherosclerosis. A review of the types of  fats and their impact on blood cholesterol levels, along with dietary recommendations to reduce fat intake is also included.  Nutrition Action Plan  Clinical staff conducted group or individual video education with verbal and written material and guidebook.  Patient learns how to incorporate Pritikin recommendations into their lifestyle. Recommendations include planning and keeping personal health goals in mind as an important part of their success.  Healthy Mind-Set    Healthy Minds, Bodies, Hearts  Clinical staff conducted group or individual video education with verbal and written material and guidebook.  Patient learns how to identify when they are stressed. Video will discuss the impact of that stress, as well as the many benefits of stress management. Patient will also be introduced to stress management techniques. The way we think, act, and feel has an impact on our hearts.  How Our Thoughts Can Heal Our Hearts  Clinical staff conducted group or individual video education with verbal and written material and guidebook.  Patient learns that negative thoughts can cause depression and anxiety. This can result in negative lifestyle behavior and serious  health problems. Cognitive behavioral therapy is an effective method to help control our thoughts in order to change and improve our emotional outlook.  Additional Videos:  Exercise    Improving Performance  Clinical staff conducted group or individual video education with verbal and written material and guidebook.  Patient learns to use a non-linear approach by alternating intensity levels and lengths of time spent exercising to help burn more calories and lose more body fat. Cardiovascular exercise helps improve heart health, metabolism, hormonal balance, blood sugar control, and recovery from fatigue. Resistance training improves strength, endurance, balance, coordination, reaction time, metabolism, and muscle mass. Flexibility exercise improves circulation, posture, and balance. Seek guidance from your physician and exercise physiologist before implementing an exercise routine and learn your capabilities and proper form for all exercise.  Introduction to Yoga  Clinical staff conducted group or individual video education with verbal and written material and guidebook.  Patient learns about yoga, a discipline of the coming together of mind, breath, and body. The benefits of yoga include improved flexibility, improved range of motion, better posture and core strength, increased lung function, weight loss, and positive self-image. Yoga's heart health benefits include lowered blood pressure, healthier heart rate, decreased cholesterol and triglyceride levels, improved immune function, and reduced stress. Seek guidance from your physician and exercise physiologist before implementing an exercise routine and learn your capabilities and proper form for all exercise.  Medical   Aging: Enhancing Your Quality of Life  Clinical staff conducted group or individual video education with verbal and written material and guidebook.  Patient learns key strategies and recommendations to stay in good physical health  and enhance quality of life, such as prevention strategies, having an advocate, securing a [place], and keeping a list of medications and system for tracking them. It also discusses how to avoid risk for bone loss.  Biology of Weight Control  Clinical staff conducted group or individual video education with verbal and written material and guidebook.  Patient learns that weight gain occurs because we consume more calories than we burn (eating more, moving less). Even if your body weight is normal, you may have higher ratios of fat compared to muscle mass. Too much body fat puts you at increased risk for cardiovascular disease, heart attack, stroke, type 2 diabetes, and obesity-related cancers. In addition to exercise, following the Pritikin Eating Plan can help  reduce your risk.  Decoding Lab Results  Clinical staff conducted group or individual video education with verbal and written material and guidebook.  Patient learns that lab test reflects one measurement whose values change over time and are influenced by many factors, including medication, stress, sleep, exercise, food, hydration, pre-existing medical conditions, and more. It is recommended to use the knowledge from this video to become more involved with your lab results and evaluate your numbers to speak with your doctor.   Diseases of Our Time - Overview  Clinical staff conducted group or individual video education with verbal and written material and guidebook.  Patient learns that according to the CDC, 50% to 70% of chronic diseases (such as obesity, type 2 diabetes, elevated lipids, hypertension, and heart disease) are avoidable through lifestyle improvements including healthier food choices, listening to satiety cues, and increased physical activity.  Sleep Disorders Clinical staff conducted group or individual video education with verbal and written material and guidebook.  Patient learns how good  quality and duration of sleep are important to overall health and well-being. Patient also learns about sleep disorders and how they impact health along with recommendations to address them, including discussing with a physician.  Nutrition  Dining Out - Part 2 Clinical staff conducted group or individual video education with verbal and written material and guidebook.  Patient learns how to plan ahead and communicate in order to maximize their dining experience in a healthy and nutritious manner. Included are recommended food choices based on the type of restaurant the patient is visiting.   Fueling a Best boy conducted group or individual video education with verbal and written material and guidebook.  There is a strong connection between our food choices and our health. Diseases like obesity and type 2 diabetes are very prevalent and are in large-part due to lifestyle choices. The Pritikin Eating Plan provides plenty of food and hunger-curbing satisfaction. It is easy to follow, affordable, and helps reduce health risks.  Menu Workshop  Clinical staff conducted group or individual video education with verbal and written material and guidebook.  Patient learns that restaurant meals can sabotage health goals because they are often packed with calories, fat, sodium, and sugar. Recommendations include strategies to plan ahead and to communicate with the manager, chef, or server to help order a healthier meal.  Planning Your Eating Strategy  Clinical staff conducted group or individual video education with verbal and written material and guidebook.  Patient learns about the [place] and its benefit of reducing the risk of disease. The [place] does not focus on calories. Instead, it emphasizes high-quality, nutrient-rich foods. By knowing the characteristics of the foods, we choose, we can determine their calorie density and make informed  decisions.  Targeting Your Nutrition Priorities  Clinical staff conducted group or individual video education with verbal and written material and guidebook.  Patient learns that lifestyle habits have a tremendous impact on disease risk and progression. This video provides eating and physical activity recommendations based on your personal health goals, such as reducing LDL cholesterol, losing weight, preventing or controlling type 2 diabetes, and reducing high blood pressure.  Vitamins and Minerals  Clinical staff conducted group or individual video education with verbal and written material and guidebook.  Patient learns different ways to obtain key vitamins and minerals, including through a recommended healthy diet. It is important to discuss all supplements you take with your doctor.   Healthy Mind-Set  Smoking Cessation  Clinical staff conducted group or individual video education with verbal and written material and guidebook.  Patient learns that cigarette smoking and tobacco addiction pose a serious health risk which affects millions of people. Stopping smoking will significantly reduce the risk of heart disease, lung disease, and many forms of cancer. Recommended strategies for quitting are covered, including working with your doctor to develop a successful plan.  Culinary   Becoming a Financial trader conducted group or individual video education with verbal and written material and guidebook.  Patient learns that cooking at home can be healthy, cost-effective, quick, and puts them in control. Keys to cooking healthy recipes will include looking at your recipe, assessing your equipment needs, planning ahead, making it simple, choosing cost-effective seasonal ingredients, and limiting the use of added fats, salts, and sugars.  Cooking - Breakfast and Snacks  Clinical staff conducted group or individual video education with verbal and written material and guidebook.   Patient learns how important breakfast is to satiety and nutrition through the entire day. Recommendations include key foods to eat during breakfast to help stabilize blood sugar levels and to prevent overeating at meals later in the day. Planning ahead is also a key component.  Cooking - Human resources officer conducted group or individual video education with verbal and written material and guidebook.  Patient learns eating strategies to improve overall health, including an approach to cook more at home. Recommendations include thinking of animal protein as a side on your plate rather than center stage and focusing instead on lower calorie dense options like vegetables, fruits, whole grains, and plant-based proteins, such as beans. Making sauces in large quantities to freeze for later and leaving the skin on your vegetables are also recommended to maximize your experience.  Cooking - Healthy Salads and Dressing Clinical staff conducted group or individual video education with verbal and written material and guidebook.  Patient learns that vegetables, fruits, whole grains, and legumes are the foundations of the [place]. Recommendations include how to incorporate each of these in flavorful and healthy salads, and how to create homemade salad dressings. Proper handling of ingredients is also covered. Cooking - Soups and [employer] - Soups and Desserts Clinical staff conducted group or individual video education with verbal and written material and guidebook.  Patient learns that Pritikin soups and desserts make for easy, nutritious, and delicious snacks and meal components that are low in sodium, fat, sugar, and calorie density, while high in vitamins, minerals, and filling fiber. Recommendations include simple and healthy ideas for soups and desserts.   Overview     The Pritikin Solution Program Overview Clinical staff conducted group or individual video education with  verbal and written material and guidebook.  Patient learns that the results of the [place] Program have been documented in more than 100 articles published in peer-reviewed journals, and the benefits include reducing risk factors for (and, in some cases, even reversing) high cholesterol, high blood pressure, type 2 diabetes, obesity, and more! An overview of the three key pillars of the Pritikin Program will be covered: eating well, doing regular exercise, and having a healthy mind-set.  WORKSHOPS  Exercise: Exercise Basics: Building Your Action Plan Clinical staff led group instruction and group discussion with PowerPoint presentation and patient guidebook. To enhance the learning environment the use of posters, models and videos may be added. At the conclusion of this workshop, patients will comprehend the difference between  physical activity and exercise, as well as the benefits of incorporating both, into their routine. Patients will understand the FITT (Frequency, Intensity, Time, and Type) principle and how to use it to build an exercise action plan. In addition, safety concerns and other considerations for exercise and cardiac rehab will be addressed by the presenter. The purpose of this lesson is to promote a comprehensive and effective weekly exercise routine in order to improve patients' overall level of fitness.   Managing Heart Disease: Your Path to a Healthier Heart Clinical staff led group instruction and group discussion with PowerPoint presentation and patient guidebook. To enhance the learning environment the use of posters, models and videos may be added.At the conclusion of this workshop, patients will understand the anatomy and physiology of the heart. Additionally, they will understand how Pritikin's three pillars impact the risk factors, the progression, and the management of heart disease.  The purpose of this lesson is to provide a high-level overview of the heart, heart  disease, and how the Pritikin lifestyle positively impacts risk factors.  Exercise Biomechanics Clinical staff led group instruction and group discussion with PowerPoint presentation and patient guidebook. To enhance the learning environment the use of posters, models and videos may be added. Patients will learn how the structural parts of their bodies function and how these functions impact their daily activities, movement, and exercise. Patients will learn how to promote a neutral spine, learn how to manage pain, and identify ways to improve their physical movement in order to promote healthy living. The purpose of this lesson is to expose patients to common physical limitations that impact physical activity. Participants will learn practical ways to adapt and manage aches and pains, and to minimize their effect on regular exercise. Patients will learn how to maintain good posture while sitting, walking, and lifting.  Balance Training and Fall Prevention  Clinical staff led group instruction and group discussion with PowerPoint presentation and patient guidebook. To enhance the learning environment the use of posters, models and videos may be added. At the conclusion of this workshop, patients will understand the importance of their sensorimotor skills (vision, proprioception, and the vestibular system) in maintaining their ability to balance as they age. Patients will apply a variety of balancing exercises that are appropriate for their current level of function. Patients will understand the common causes for poor balance, possible solutions to these problems, and ways to modify their physical environment in order to minimize their fall risk. The purpose of this lesson is to teach patients about the importance of maintaining balance as they age and ways to minimize their risk of falling.  WORKSHOPS   Nutrition:  Fueling a Scientist, research (physical sciences) led group instruction and group  discussion with PowerPoint presentation and patient guidebook. To enhance the learning environment the use of posters, models and videos may be added. Patients will review the foundational principles of the [place] and understand what constitutes a serving size in each of the food groups. Patients will also learn Pritikin-friendly foods that are better choices when away from home and review make-ahead meal and snack options. Calorie density will be reviewed and applied to three nutrition priorities: weight maintenance, weight loss, and weight gain. The purpose of this lesson is to reinforce (in a group setting) the key concepts around what patients are recommended to eat and how to apply these guidelines when away from home by planning and selecting Pritikin-friendly options. Patients will understand how calorie density may be adjusted  for different weight management goals.  Mindful Eating  Clinical staff led group instruction and group discussion with PowerPoint presentation and patient guidebook. To enhance the learning environment the use of posters, models and videos may be added. Patients will briefly review the concepts of the [employer] and the importance of low-calorie dense foods. The concept of mindful eating will be introduced as well as the importance of paying attention to internal hunger signals. Triggers for non-hunger eating and techniques for dealing with triggers will be explored. The purpose of this lesson is to provide patients with the opportunity to review the basic principles of the [place], discuss the value of eating mindfully and how to measure internal cues of hunger and fullness using the Hunger Scale. Patients will also discuss reasons for non-hunger eating and learn strategies to use for controlling emotional eating.  Targeting Your Nutrition Priorities Clinical staff led group instruction and group discussion with PowerPoint presentation and  patient guidebook. To enhance the learning environment the use of posters, models and videos may be added. Patients will learn how to determine their genetic susceptibility to disease by reviewing their family history. Patients will gain insight into the importance of diet as part of an overall healthy lifestyle in mitigating the impact of genetics and other environmental insults. The purpose of this lesson is to provide patients with the opportunity to assess their personal nutrition priorities by looking at their family history, their own health history and current risk factors. Patients will also be able to discuss ways of prioritizing and modifying the [place] for their highest risk areas  Menu  Clinical staff led group instruction and group discussion with PowerPoint presentation and patient guidebook. To enhance the learning environment the use of posters, models and videos may be added. Using menus brought in from [employer], or printed from [employer], patients will apply the [place] dining out guidelines that were presented in the [employer] video. Patients will also be able to practice these guidelines in a variety of provided scenarios. The purpose of this lesson is to provide patients with the opportunity to practice hands-on learning of the [place] with actual menus and practice scenarios.  Label Reading Clinical staff led group instruction and group discussion with PowerPoint presentation and patient guidebook. To enhance the learning environment the use of posters, models and videos may be added. Patients will review and discuss the Pritikin label reading guidelines presented in Pritikin's Label Reading Educational series video. Using fool labels brought in from local grocery stores and markets, patients will apply the label reading guidelines and determine if the packaged food meet the Pritikin guidelines. The purpose of this  lesson is to provide patients with the opportunity to review, discuss, and practice hands-on learning of the Pritikin Label Reading guidelines with actual packaged food labels. [place] Workshops are designed to teach patients ways to prepare quick, simple, and affordable recipes at home. The importance of nutrition's role in chronic disease risk reduction is reflected in its emphasis in the overall Pritikin program. By learning how to prepare essential core Pritikin Eating Plan recipes, patients will increase control over what they eat; be able to customize the flavor of foods without the use of added salt, sugar, or fat; and improve the quality of the food they consume. By learning a set of core recipes which are easily assembled, quickly prepared, and affordable, patients are more likely to prepare  more healthy foods at home. These workshops focus on convenient breakfasts, simple entres, side dishes, and desserts which can be prepared with minimal effort and are consistent with nutrition recommendations for cardiovascular risk reduction. Cooking [employer] are taught by a Engineer, materials (RD) who has been trained by the [employer]. The chef or RD has a clear understanding of the importance of minimizing - if not completely eliminating - added fat, sugar, and sodium in recipes. Throughout the series of [place] Workshop sessions, patients will learn about healthy ingredients and efficient methods of cooking to build confidence in their capability to prepare    Cooking School weekly topics:  Adding Flavor- Sodium-Free  Fast and Healthy Breakfasts  Powerhouse Plant-Based Proteins  Satisfying Salads and Dressings  Simple Sides and Sauces  International Cuisine-Spotlight on the [place] Zones  Delicious Desserts  Savory Soups  Efficiency Cooking - Meals in a Snap  Tasty Appetizers and Snacks  Comforting Weekend Breakfasts  One-Pot  Wonders   Fast Evening Meals  Easy [place] (Psychosocial): New Thoughts, New Behaviors Clinical staff led group instruction and group discussion with PowerPoint presentation and patient guidebook. To enhance the learning environment the use of posters, models and videos may be added. Patients will learn and practice techniques for developing effective health and lifestyle goals. Patients will be able to effectively apply the goal setting process learned to develop at least one new personal goal.  The purpose of this lesson is to expose patients to a new skill set of behavior modification techniques such as techniques setting SMART goals, overcoming barriers, and achieving new thoughts and new behaviors.  Managing Moods and Relationships Clinical staff led group instruction and group discussion with PowerPoint presentation and patient guidebook. To enhance the learning environment the use of posters, models and videos may be added. Patients will learn how emotional and chronic stress factors can impact their health and relationships. They will learn healthy ways to manage their moods and utilize positive coping mechanisms. In addition, [facility] patients will learn ways to improve communication skills. The purpose of this lesson is to expose patients to ways of understanding how one's mood and health are intimately connected. Developing a healthy outlook can help build positive relationships and connections with others. Patients will understand the importance of utilizing effective communication skills that include actively listening and being heard. They will learn and understand the importance of the "4 Cs" and especially Connections in fostering of a Healthy Mind-Set.  Healthy Sleep for a Healthy Heart Clinical staff led group instruction and group discussion with PowerPoint presentation and patient guidebook. To enhance the learning  environment the use of posters, models and videos may be added. At the conclusion of this workshop, patients will be able to demonstrate knowledge of the importance of sleep to overall health, well-being, and quality of life. They will understand the symptoms of, and treatments for, common sleep disorders. Patients will also be able to identify daytime and nighttime behaviors which impact sleep, and they will be able to apply these tools to help manage sleep-related challenges. The purpose of this lesson is to provide patients with a general overview of sleep and outline the importance of quality sleep. Patients will learn about a few of the most common sleep disorders. Patients will also be introduced to the concept of "sleep hygiene," and discover ways to self-manage certain sleeping problems through simple daily behavior changes. Finally,  the workshop will motivate patients by clarifying the links between quality sleep and their goals of heart-healthy living.   Recognizing and Reducing Stress Clinical staff led group instruction and group discussion with PowerPoint presentation and patient guidebook. To enhance the learning environment the use of posters, models and videos may be added. At the conclusion of this workshop, patients will be able to understand the types of stress reactions, differentiate between acute and chronic stress, and recognize the impact that chronic stress has on their health. They will also be able to apply different coping mechanisms, such as reframing negative self-talk. Patients will have the opportunity to practice a variety of stress management techniques, such as deep abdominal breathing, progressive muscle relaxation, and/or guided imagery.  The purpose of this lesson is to educate patients on the role of stress in their lives and to provide healthy techniques for coping with it.  Learning Barriers/Preferences:  Learning Barriers/Preferences - 02/13/22 1525       Learning  Barriers/Preferences   Learning Barriers Hearing    Learning Preferences Audio;Computer/Internet;Group Instruction;Individual Instruction;Pictoral;Skilled Demonstration;Verbal Instruction;Video;Written Material             Education Topics:  Knowledge Questionnaire Score:  Knowledge Questionnaire Score - 02/13/22 1526       Knowledge Questionnaire Score   Pre Score 24/24             Core Components/Risk Factors/Patient Goals at Admission:  Personal Goals and Risk Factors at Admission - 02/13/22 1526       Core Components/Risk Factors/Patient Goals on Admission    Weight Management Yes;Weight Loss    Intervention Weight Management: Develop a combined nutrition and exercise program designed to reach desired caloric intake, while maintaining appropriate intake of nutrient and fiber, sodium and fats, and appropriate energy expenditure required for the weight goal.;Weight Management: Provide education and appropriate resources to help participant work on and attain dietary goals.;Weight Management/Obesity: Establish reasonable short term and long term weight goals.;Obesity: Provide education and appropriate resources to help participant work on and attain dietary goals.    Admit Weight 219 lb 11.2 oz (99.7 kg)    Goal Weight: Long Term 190 lb (86.2 kg)    Expected Outcomes Short Term: Continue to assess and modify interventions until short term weight is achieved;Long Term: Adherence to nutrition and physical activity/exercise program aimed toward attainment of established weight goal;Weight Loss: Understanding of general recommendations for a balanced deficit meal plan, which promotes 1-2 lb weight loss per week and includes a negative energy balance of [phone] kcal/d;Understanding recommendations for meals to include 15-35% energy as protein, 25-35% energy from fat, 35-60% energy from carbohydrates, less than '200mg'$  of dietary cholesterol, 20-35 gm of total fiber daily;Understanding of  distribution of calorie intake throughout the day with the consumption of 4-5 meals/snacks    Hypertension Yes    Intervention Monitor prescription use compliance.;Provide education on lifestyle modifcations including regular physical activity/exercise, weight management, moderate sodium restriction and increased consumption of fresh fruit, vegetables, and low fat dairy, alcohol moderation, and smoking cessation.    Expected Outcomes Short Term: Continued assessment and intervention until BP is < 140/71m HG in hypertensive participants. < 130/863mHG in hypertensive participants with diabetes, heart failure or chronic kidney disease.;Long Term: Maintenance of blood pressure at goal levels.    Lipids Yes    Intervention Provide education and support for participant on nutrition & aerobic/resistive exercise along with prescribed medications to achieve LDL '70mg'$ , HDL >'40mg'$ .    Expected Outcomes Short  Term: Participant states understanding of desired cholesterol values and is compliant with medications prescribed. Participant is following exercise prescription and nutrition guidelines.;Long Term: Cholesterol controlled with medications as prescribed, with individualized exercise RX and with personalized nutrition plan. Value goals: LDL < '70mg'$ , HDL > 40 mg.    Personal Goal Other Yes    Personal Goal long and short the same: Flexibility, stamina and wt loss (goal 190 lbs), wants RD contult and more variety with food    Intervention Will continue to monitor pt and progress workloads as tolerated without sign or symptom    Expected Outcomes Pt will achieve his goals             Core Components/Risk Factors/Patient Goals Review:   Goals and Risk Factor Review     Row Name 02/26/22 1449             Core Components/Risk Factors/Patient Goals Review   Personal Goals Review Weight Management/Obesity;Hypertension;Lipids       Review Rowe started intensive cardiac rehab on 02/26/22 and did well with  exercise. Vital signs were stable       Expected Outcomes Roew will continue to participate in intensive cardiac rehab for exercise, nutrition and lifestyle modifications                Core Components/Risk Factors/Patient Goals at Discharge (Final Review):   Goals and Risk Factor Review - 02/26/22 1449       Core Components/Risk Factors/Patient Goals Review   Personal Goals Review Weight Management/Obesity;Hypertension;Lipids    Review Rowe started intensive cardiac rehab on 02/26/22 and did well with exercise. Vital signs were stable    Expected Outcomes Roew will continue to participate in intensive cardiac rehab for exercise, nutrition and lifestyle modifications             ITP Comments:  ITP Comments     Row Name 02/13/22 1445 02/26/22 1447         ITP Comments Dr Loreta Ave MD, Medical Director. Introduction to [facility]/Intensive cardiac rehab. Initial Orientation Packet Reviewed with the patient 30 Day ITP Review. Rowe started intensive caridac rehab on 02/26/22 and did well with exercise               Comments: See ITP Comments

## 2022-03-03 ENCOUNTER — Encounter (HOSPITAL_COMMUNITY): Payer: Medicare Other

## 2022-03-05 ENCOUNTER — Encounter (HOSPITAL_COMMUNITY): Payer: Medicare Other

## 2022-03-06 DIAGNOSIS — Z0100 Encounter for examination of eyes and vision without abnormal findings: Secondary | ICD-10-CM | POA: Diagnosis not present

## 2022-03-07 ENCOUNTER — Encounter (HOSPITAL_COMMUNITY): Payer: Medicare Other

## 2022-03-07 ENCOUNTER — Other Ambulatory Visit: Payer: Self-pay | Admitting: Physician Assistant

## 2022-03-08 ENCOUNTER — Other Ambulatory Visit: Payer: Self-pay | Admitting: Physician Assistant

## 2022-03-10 ENCOUNTER — Encounter (HOSPITAL_COMMUNITY)
Admission: RE | Admit: 2022-03-10 | Discharge: 2022-03-10 | Disposition: A | Discharge status: Home / Self Care | Payer: Medicare Other | Source: Ambulatory Visit | Attending: Cardiovascular Disease | Admitting: Cardiovascular Disease

## 2022-03-10 DIAGNOSIS — Z951 Presence of aortocoronary bypass graft: Secondary | ICD-10-CM | POA: Diagnosis not present

## 2022-03-10 DIAGNOSIS — Z48812 Encounter for surgical aftercare following surgery on the circulatory system: Secondary | ICD-10-CM | POA: Insufficient documentation

## 2022-03-12 ENCOUNTER — Encounter (HOSPITAL_COMMUNITY)
Admission: RE | Admit: 2022-03-12 | Discharge: 2022-03-12 | Disposition: A | Discharge status: Home / Self Care | Payer: Medicare Other | Source: Ambulatory Visit | Attending: Cardiovascular Disease | Admitting: Cardiovascular Disease

## 2022-03-12 DIAGNOSIS — Z951 Presence of aortocoronary bypass graft: Secondary | ICD-10-CM

## 2022-03-12 DIAGNOSIS — Z48812 Encounter for surgical aftercare following surgery on the circulatory system: Secondary | ICD-10-CM | POA: Diagnosis not present

## 2022-03-14 ENCOUNTER — Encounter (HOSPITAL_COMMUNITY)
Admission: RE | Admit: 2022-03-14 | Discharge: 2022-03-14 | Disposition: A | Discharge status: Home / Self Care | Payer: Medicare Other | Source: Ambulatory Visit | Attending: Cardiovascular Disease | Admitting: Cardiovascular Disease

## 2022-03-14 DIAGNOSIS — Z951 Presence of aortocoronary bypass graft: Secondary | ICD-10-CM

## 2022-03-14 DIAGNOSIS — Z48812 Encounter for surgical aftercare following surgery on the circulatory system: Secondary | ICD-10-CM | POA: Diagnosis not present

## 2022-03-17 ENCOUNTER — Encounter (HOSPITAL_COMMUNITY)
Admission: RE | Admit: 2022-03-17 | Discharge: 2022-03-17 | Disposition: A | Discharge status: Home / Self Care | Payer: Medicare Other | Source: Ambulatory Visit | Attending: Cardiovascular Disease | Admitting: Cardiovascular Disease

## 2022-03-17 DIAGNOSIS — Z951 Presence of aortocoronary bypass graft: Secondary | ICD-10-CM | POA: Diagnosis not present

## 2022-03-17 DIAGNOSIS — Z48812 Encounter for surgical aftercare following surgery on the circulatory system: Secondary | ICD-10-CM | POA: Diagnosis not present

## 2022-03-19 ENCOUNTER — Encounter (HOSPITAL_COMMUNITY)
Admission: RE | Admit: 2022-03-19 | Discharge: 2022-03-19 | Disposition: A | Discharge status: Home / Self Care | Payer: Medicare Other | Source: Ambulatory Visit | Attending: Cardiovascular Disease | Admitting: Cardiovascular Disease

## 2022-03-19 DIAGNOSIS — Z951 Presence of aortocoronary bypass graft: Secondary | ICD-10-CM | POA: Diagnosis not present

## 2022-03-19 DIAGNOSIS — Z48812 Encounter for surgical aftercare following surgery on the circulatory system: Secondary | ICD-10-CM | POA: Diagnosis not present

## 2022-03-19 NOTE — Progress Notes (Signed)
Reviewed home exercise guidelines with patient including endpoints, temperature precautions, target heart rate and rate of perceived exertion. Patient is currently walking and has 5 - 6 lb hand weights at home that he can use as his mode of home exercise. Patient voices understanding of instructions given.  Sol Passer, MS, ACSM CEP

## 2022-03-21 ENCOUNTER — Encounter (HOSPITAL_COMMUNITY)
Admission: RE | Admit: 2022-03-21 | Discharge: 2022-03-21 | Disposition: A | Discharge status: Home / Self Care | Payer: Medicare Other | Source: Ambulatory Visit | Attending: Cardiovascular Disease | Admitting: Cardiovascular Disease

## 2022-03-21 DIAGNOSIS — Z951 Presence of aortocoronary bypass graft: Secondary | ICD-10-CM

## 2022-03-21 DIAGNOSIS — Z48812 Encounter for surgical aftercare following surgery on the circulatory system: Secondary | ICD-10-CM | POA: Diagnosis not present

## 2022-03-24 ENCOUNTER — Encounter (HOSPITAL_COMMUNITY)
Admission: RE | Admit: 2022-03-24 | Discharge: 2022-03-24 | Disposition: A | Discharge status: Home / Self Care | Payer: Medicare Other | Source: Ambulatory Visit | Attending: Cardiovascular Disease | Admitting: Cardiovascular Disease

## 2022-03-24 DIAGNOSIS — Z951 Presence of aortocoronary bypass graft: Secondary | ICD-10-CM

## 2022-03-24 DIAGNOSIS — Z48812 Encounter for surgical aftercare following surgery on the circulatory system: Secondary | ICD-10-CM | POA: Diagnosis not present

## 2022-03-26 ENCOUNTER — Encounter (HOSPITAL_COMMUNITY)
Admission: RE | Admit: 2022-03-26 | Discharge: 2022-03-26 | Disposition: A | Discharge status: Home / Self Care | Payer: Medicare Other | Source: Ambulatory Visit | Attending: Cardiovascular Disease | Admitting: Cardiovascular Disease

## 2022-03-26 DIAGNOSIS — Z48812 Encounter for surgical aftercare following surgery on the circulatory system: Secondary | ICD-10-CM | POA: Diagnosis not present

## 2022-03-26 DIAGNOSIS — Z951 Presence of aortocoronary bypass graft: Secondary | ICD-10-CM | POA: Diagnosis not present

## 2022-03-26 NOTE — Progress Notes (Signed)
Cardiac Individual Treatment Plan  Patient Details  Name: Keith Murillo MRN: [id number] Date of Birth: 09/25/1950 Referring Provider:   Flowsheet Row INTENSIVE CARDIAC REHAB ORIENT from 02/13/2022 in [facility]  Referring Provider Dr. Ida Rogue, MD       Initial Encounter Date:  [place] from 02/13/2022 in [facility]  Date 02/13/22       Visit Diagnosis: 12/06/21 S/P CABG x 3  Patient's Home Medications on Admission:  Current Outpatient Medications:    acetaminophen (TYLENOL) 500 MG tablet, Take 2 tablets (1,000 mg total) by mouth every 6 (six) hours as needed., Disp: 30 tablet, Rfl: 0   aspirin EC 81 MG tablet, Take 1 tablet (81 mg total) by mouth daily., Disp: , Rfl:    Cholecalciferol (VITAMIN D-3) 125 MCG (5000 UT) TABS, Take 5,000 Units by mouth daily. , Disp: , Rfl:    clopidogrel (PLAVIX) 75 MG tablet, Take 1 tablet (75 mg total) by mouth daily., Disp: 30 tablet, Rfl: 3   Coenzyme Q10 ([employer] COQ-10) 200 MG capsule, Take 200 mg by mouth daily., Disp: , Rfl:    DHEA 25 MG CAPS, Take 25 mg by mouth daily. , Disp: , Rfl:    diltiazem (CARDIZEM SR) 120 MG 12 hr capsule, Take 1 capsule (120 mg total) by mouth every 12 (twelve) hours., Disp: 60 capsule, Rfl: 3   Evolocumab (REPATHA SURECLICK) [id number] MG/ML SOAJ, Inject 140 mg into the skin every 14 (fourteen) days., Disp: 2 mL, Rfl: 5   ezetimibe (ZETIA) 10 MG tablet, Take 1 tablet (10 mg total) by mouth daily., Disp: 30 tablet, Rfl: 3   furosemide (LASIX) 40 MG tablet, Take 1 tablet (40 mg total) by mouth daily as needed. For weight gain of 3 lbs in 24 hours, Disp: 30 tablet, Rfl: 3   loperamide (IMODIUM A-D) 2 MG tablet, Take 1 tablet (2 mg total) by mouth 4 (four) times daily as needed for diarrhea or loose stools., Disp: 30 tablet, Rfl: 0   Magnesium Citrate POWD, Take 1  Scoop by mouth daily as needed (regularity)., Disp: , Rfl:    melatonin 5 MG TABS, Take 5 mg by mouth at bedtime., Disp: , Rfl:    Menaquinone-7 (VITAMIN K2 PO), Take 150 mcg by mouth daily. MK-7, Disp: , Rfl:    Multiple Vitamins-Minerals (IMMUNE SUPPORT PO), Take 520 mg by mouth daily. Vitac-ld Liposomal Vitamin, Disp: , Rfl:    NON FORMULARY, Take 1 capsule by mouth daily. HAIOTB Circulation Syn3rgy (Beet Root, L-Arginine, Horse Chestnut), Disp: , Rfl:    NP THYROID 60 MG tablet, Take 60 mg by mouth every morning., Disp: , Rfl:    OVER THE COUNTER MEDICATION, Take 2 capsules by mouth daily. [place] grape capsules - 650 mg each - take 2 capsules by mouth daily, Disp: , Rfl:    potassium chloride (KLOR-CON M) 10 MEQ tablet, Take 2 tablets (20 mEq total) by mouth daily as needed. Take when you take a Lasix (Furosemide), Disp: 60 tablet, Rfl: 3   Pregnenolone Micronized (PREGNENOLONE PO), Take 75 mg by mouth daily., Disp: , Rfl:    Probiotic Product (PROBIOTIC PO), Take 1 capsule by mouth daily. 20 billion, Disp: , Rfl:    QUERCETIN PO, Take 1 capsule by mouth daily. Bio-Quercetin, Disp: , Rfl:    sildenafil (REVATIO) 20 MG tablet, take 2 to 5  tablets by mouth 1 hour prior to intercourse, Disp: 90 tablet, Rfl: 1   testosterone cypionate (DEPOTESTOSTERONE CYPIONATE) 200 MG/ML injection, Inject 0.5 mLs into the skin once a week. Once a week, Disp: , Rfl:    traMADol (ULTRAM) 50 MG tablet, Take 1 tablet (50 mg total) by mouth every 4 (four) hours as needed for moderate pain., Disp: 30 tablet, Rfl: 0   traZODone (DESYREL) 50 MG tablet, Take 0.5-1 tablets (25-50 mg total) by mouth at bedtime as needed for sleep., Disp: 30 tablet, Rfl: 0   Turmeric 500 MG TABS, Take 500 mg by mouth daily., Disp: , Rfl:    UNABLE TO FIND, Take 1 drop by mouth daily as needed. Med Name: Cortexi for tinnitus, Disp: , Rfl:    Zinc 50 MG TABS, Take 50 mg by mouth daily., Disp: , Rfl:   Past Medical History: Past Medical  History:  Diagnosis Date   Cancer ([place])    prostate   Chest pain    Complication of anesthesia    fighting with waking up   Coronary artery disease    Heart attack ([place])    Hypercholesterolemia    Hyperlipidemia    Hypertension    Ischemic heart disease September 2004   Known--with non-Q-wave myocardial infarction    Obesity    Sleep apnea     Tobacco Use: Social History   Tobacco Use  Smoking Status Never  Smokeless Tobacco Never    Labs: Review Flowsheet  More data exists      Latest Ref Rng & Units 07/30/2021 11/28/2021 12/05/2021 12/06/2021 12/26/2021  Labs for ITP Cardiac and Pulmonary Rehab  Cholestrol 0 - 200 mg/dL 186  179  - - 158   LDL (calc) 0 - 99 mg/dL 114  82  - - 84   HDL-C >39.00 mg/dL 47  33  - - 51.30   Trlycerides 0.0 - 149.0 mg/dL 142  322  - - 111.0   Hemoglobin A1c 4.8 - 5.6 % - 5.7  - - -  PH, Arterial 7.35 - 7.45 - - 7.43  7.322  7.327  7.319  7.206  7.284  7.330  -  PCO2 arterial 32 - 48 mmHg - - 33  38.1  42.1  39.8  59.9  48.2  46.2  -  Bicarbonate 20.0 - 28.0 mmol/L - - 21.9  19.5  21.8  20.3  23.8  22.9  23.8  24.3  -  TCO2 22 - 32 mmol/L - - - '21  23  21  26  22  24  25  23  25  26  24  26  '$ -  Acid-base deficit 0.0 - 2.0 mmol/L - - 1.7  6.0  4.0  5.0  5.0  4.0  3.0  2.0  -  O2 Saturation % - - 98.2  95  97  99  86  98  81  100  -    Capillary Blood Glucose: Lab Results  Component Value Date   GLUCAP 111 (H) 12/09/2021   GLUCAP 113 (H) 12/09/2021   GLUCAP 179 (H) 12/09/2021   GLUCAP 131 (H) 12/09/2021   GLUCAP 126 (H) 12/09/2021     Exercise Target Goals: Exercise Program Goal: Individual exercise prescription set using results from initial 6 min walk test and THRR while considering  patient's activity barriers and safety.   Exercise Prescription Goal: Initial exercise prescription builds to 30-45 minutes a day of aerobic activity, 2-3 days per week.  Home exercise guidelines will be given to patient during program as part of  exercise prescription that the participant will acknowledge.  Activity Barriers & Risk Stratification:  Activity Barriers & Cardiac Risk Stratification - 02/13/22 1512       Activity Barriers & Cardiac Risk Stratification   Activity Barriers Shortness of Breath;Balance Concerns    Cardiac Risk Stratification High   under 5 MET's on 6MWT            6 Minute Walk:  6 Minute Walk     Row Name 02/13/22 1510         6 Minute Walk   Phase Initial     Distance 1530 feet     Walk Time 6 minutes     # of Rest Breaks 0     MPH 2.9     METS 3.37     RPE 11     Perceived Dyspnea  0     VO2 Peak 11.78     Symptoms Yes (comment)     Comments Mild chest pressure post walk test, resolved with rest,     Resting HR 105 bpm     Resting BP 124/76     Resting Oxygen Saturation  97 %     Exercise Oxygen Saturation  during 6 min walk 97 %     Max Ex. HR 117 bpm     Max Ex. BP 158/90     2 Minute Post BP 138/82              Oxygen Initial Assessment:   Oxygen Re-Evaluation:   Oxygen Discharge (Final Oxygen Re-Evaluation):   Initial Exercise Prescription:  Initial Exercise Prescription - 02/13/22 1500       Date of Initial Exercise RX and Referring Provider   Date 02/13/22    Referring Provider Dr. Ida Rogue, MD    Expected Discharge Date 04/25/22      Bike   Level 2    Minutes 15    METs 2.3      NuStep   Level 2    SPM 85    Minutes 15    METs 2.3      Prescription Details   Frequency (times per week) 3    Duration Progress to 30 minutes of continuous aerobic without signs/symptoms of physical distress      Intensity   THRR 40-80% of Max Heartrate 60-120    Ratings of Perceived Exertion 11-13    Perceived Dyspnea 0-4      Progression   Progression Continue progressive overload as per policy without signs/symptoms or physical distress.      Resistance Training   Training Prescription Yes    Weight 4    Reps 10-15             Perform  Capillary Blood Glucose checks as needed.  Exercise Prescription Changes:   Exercise Prescription Changes     Row Name 02/26/22 1021 03/19/22 1029           Response to Exercise   Blood Pressure (Admit) 122/68 120/80      Blood Pressure (Exercise) 146/78 138/80      Blood Pressure (Exit) 124/78 130/74      Heart Rate (Admit) 92 bpm 101 bpm      Heart Rate (Exercise) 104 bpm 130 bpm      Heart Rate (Exit) 88 bpm 98 bpm      Rating of Perceived Exertion (Exercise) 13 13  Symptoms None None      Comments Off to a good start with exercise. --      Duration Continue with 30 min of aerobic exercise without signs/symptoms of physical distress. Continue with 30 min of aerobic exercise without signs/symptoms of physical distress.      Intensity THRR unchanged THRR unchanged        Progression   Progression Continue to progress workloads to maintain intensity without signs/symptoms of physical distress. Continue to progress workloads to maintain intensity without signs/symptoms of physical distress.      Average METs 2.1 2.6        Resistance Training   Training Prescription No  Relaxation day, no weights No  Relaxation day, no weights        Interval Training   Interval Training No No        Bike   Level 2 2      Watts 19 33      Minutes 15 15      METs 2.5 3        NuStep   Level 2 3      SPM 67 93      Minutes 15 15      METs 1.8 2.3        Home Exercise Plan   Plans to continue exercise at -- Home (comment)  Walking      Frequency -- Add 2 additional days to program exercise sessions.      Initial Home Exercises Provided -- 03/19/22               Exercise Comments:   Exercise Comments     Row Name 02/26/22 1123 03/19/22 1117         Exercise Comments Patient tolerated first session of exercise well without symptoms. Reviewed home exercise guidelines, METs, and goals with patient.               Exercise Goals and Review:   Exercise Goals      Row Name 02/13/22 1521             Exercise Goals   Increase Physical Activity Yes       Intervention Provide advice, education, support and counseling about physical activity/exercise needs.;Develop an individualized exercise prescription for aerobic and resistive training based on initial evaluation findings, risk stratification, comorbidities and participant's personal goals.       Expected Outcomes Short Term: Attend rehab on a regular basis to increase amount of physical activity.;Long Term: Exercising regularly at least 3-5 days a week.;Long Term: Add in home exercise to make exercise part of routine and to increase amount of physical activity.       Increase Strength and Stamina Yes       Intervention Provide advice, education, support and counseling about physical activity/exercise needs.;Develop an individualized exercise prescription for aerobic and resistive training based on initial evaluation findings, risk stratification, comorbidities and participant's personal goals.       Expected Outcomes Short Term: Increase workloads from initial exercise prescription for resistance, speed, and METs.;Short Term: Perform resistance training exercises routinely during rehab and add in resistance training at home;Long Term: Improve cardiorespiratory fitness, muscular endurance and strength as measured by increased METs and functional capacity (6MWT)       Able to understand and use rate of perceived exertion (RPE) scale Yes       Intervention Provide education and explanation on how to use RPE scale  Expected Outcomes Short Term: Able to use RPE daily in rehab to express subjective intensity level;Long Term:  Able to use RPE to guide intensity level when exercising independently       Knowledge and understanding of Target Heart Rate Range (THRR) Yes       Intervention Provide education and explanation of THRR including how the numbers were predicted and where they are located for reference        Expected Outcomes Short Term: Able to state/look up THRR;Long Term: Able to use THRR to govern intensity when exercising independently;Short Term: Able to use daily as guideline for intensity in rehab       Understanding of Exercise Prescription Yes       Intervention Provide education, explanation, and written materials on patient's individual exercise prescription       Expected Outcomes Short Term: Able to explain program exercise prescription;Long Term: Able to explain home exercise prescription to exercise independently                Exercise Goals Re-Evaluation :  Exercise Goals Re-Evaluation     Row Name 02/26/22 1123 03/19/22 1117           Exercise Goal Re-Evaluation   Exercise Goals Review Increase Physical Activity;Able to understand and use rate of perceived exertion (RPE) scale Increase Physical Activity;Able to understand and use rate of perceived exertion (RPE) scale;Increase Strength and Stamina;Knowledge and understanding of Target Heart Rate Range (THRR);Able to check pulse independently;Understanding of Exercise Prescription      Comments Patient able to understand and use RPE scale appropriately. Reviewed exercise prescription with patient. Patient is walking and has 5-6 lb hand weights at home that he can use for his resistance training. Discussed increasing hand weights from 4 to 5 lbs, and patient is amenable to this.      Expected Outcomes Progress workloads as tolerated to help increase cardiorespiratory fitness. Patient will walk at home at least 30 minutes 2-3 days/week in addition to exercise at cardiac rehab to help achieve 150 minutes of aerobic exercise/week. Increase hand weights from 4 to 5 lbs at CR.               Discharge Exercise Prescription (Final Exercise Prescription Changes):  Exercise Prescription Changes - 03/19/22 1029       Response to Exercise   Blood Pressure (Admit) 120/80    Blood Pressure (Exercise) 138/80    Blood Pressure  (Exit) 130/74    Heart Rate (Admit) 101 bpm    Heart Rate (Exercise) 130 bpm    Heart Rate (Exit) 98 bpm    Rating of Perceived Exertion (Exercise) 13    Symptoms None    Duration Continue with 30 min of aerobic exercise without signs/symptoms of physical distress.    Intensity THRR unchanged      Progression   Progression Continue to progress workloads to maintain intensity without signs/symptoms of physical distress.    Average METs 2.6      Resistance Training   Training Prescription No   Relaxation day, no weights     Interval Training   Interval Training No      Bike   Level 2    Watts 33    Minutes 15    METs 3      NuStep   Level 3    SPM 93    Minutes 15    METs 2.3      Home Exercise Plan   Plans  to continue exercise at Home (comment)   Walking   Frequency Add 2 additional days to program exercise sessions.    Initial Home Exercises Provided 03/19/22             Nutrition:  Target Goals: Understanding of nutrition guidelines, daily intake of sodium 1500mg , cholesterol 200mg , calories 30% from fat and 7% or less from saturated fats, daily to have 5 or more servings of fruits and vegetables.  Biometrics:  Pre Biometrics - 02/13/22 1521       Pre Biometrics   Waist Circumference 43 inches    Hip Circumference 44 inches    Waist to Hip Ratio 0.98 %    Triceps Skinfold 14 mm    % Body Fat 30.9 %    Grip Strength 42 kg    Flexibility --   pt unable to reach   Single Leg Stand 6.7 seconds              Nutrition Therapy Plan and Nutrition Goals:  Nutrition Therapy & Goals - 03/24/22 1141       Nutrition Therapy   Diet Heart Healthy Diet      Personal Nutrition Goals   Nutrition Goal Patient to identify strategies for reducing cardiovascular risk by attending the weekly [employer] education and nutrition series    Personal Goal #2 Patient to improve diet quality by using the plate method as a daily guide for meal planning to include lean  protein/plant protein, fruits, vegetables, whole grains, nonfat dairy as part of well balanced diet    Comments Goals in progress. Lipids improved but LDL is not at goal (<55); Repatha/PCSK9 was started following cardiology follow-up on 02/25/22. Justine Null does continue to attend the [employer] education and nutrition series. Justine Null will benefit from participation in intensive cardiac rehab for nutrition, exercise, and lifestyle modification.      Intervention Plan   Intervention Prescribe, educate and counsel regarding individualized specific dietary modifications aiming towards targeted core components such as weight, hypertension, lipid management, diabetes, heart failure and other comorbidities.;Nutrition handout(s) given to patient.    Expected Outcomes Short Term Goal: Understand basic principles of dietary content, such as calories, fat, sodium, cholesterol and nutrients.;Long Term Goal: Adherence to prescribed nutrition plan.             Nutrition Assessments:  MEDIFICTS Score Key: ?70 Need to make dietary changes  40-70 Heart Healthy Diet ? 40 Therapeutic Level Cholesterol Diet    Picture Your Plate Scores: [id number] Unhealthy dietary pattern with much room for improvement. 41-50 Dietary pattern unlikely to meet recommendations for good health and room for improvement. 51-60 More healthful dietary pattern, with some room for improvement.  >60 Healthy dietary pattern, although there may be some specific behaviors that could be improved.    Nutrition Goals Re-Evaluation:  Nutrition Goals Re-Evaluation     [place] Name 02/26/22 1603 03/24/22 1141           Goals   Current Weight 220 lb 3.8 oz (99.9 kg) 220 lb 10.9 oz (100.1 kg)      Comment lipids improved- LDL 84, A1c 5.7 No new labs; most recent labs  lipids improved- LDL 84, A1c 5.7      Expected Outcome Lipids improved but LDL is not at goal (<55). It was recommended to add PCSK9 inhibitor at cardiology follow-up on 02/25/22; he is not  currently on a statin per documentation. Justine Null will benefit from participation in intensive cardiac rehab for nutrition, exercise, and lifestyle  modification. Goals in progress. Lipids improved but LDL is not at goal (<55); Repatha/PCSK9 was started following cardiology follow-up on 02/25/22. Justine Null does continue to attend the [facility] education and nutrition series. Justine Null will benefit from participation in intensive cardiac rehab for nutrition, exercise, and lifestyle modification.               Nutrition Goals Re-Evaluation:  Nutrition Goals Re-Evaluation     [place] Name 02/26/22 1603 03/24/22 1141           Goals   Current Weight 220 lb 3.8 oz (99.9 kg) 220 lb 10.9 oz (100.1 kg)      Comment lipids improved- LDL 84, A1c 5.7 No new labs; most recent labs  lipids improved- LDL 84, A1c 5.7      Expected Outcome Lipids improved but LDL is not at goal (<55). It was recommended to add PCSK9 inhibitor at cardiology follow-up on 02/25/22; he is not currently on a statin per documentation. Justine Null will benefit from participation in intensive cardiac rehab for nutrition, exercise, and lifestyle modification. Goals in progress. Lipids improved but LDL is not at goal (<55); Repatha/PCSK9 was started following cardiology follow-up on 02/25/22. Justine Null does continue to attend the [employer] education and nutrition series. Justine Null will benefit from participation in intensive cardiac rehab for nutrition, exercise, and lifestyle modification.               Nutrition Goals Discharge (Final Nutrition Goals Re-Evaluation):  Nutrition Goals Re-Evaluation - 03/24/22 1141       Goals   Current Weight 220 lb 10.9 oz (100.1 kg)    Comment No new labs; most recent labs  lipids improved- LDL 84, A1c 5.7    Expected Outcome Goals in progress. Lipids improved but LDL is not at goal (<55); Repatha/PCSK9 was started following cardiology follow-up on 02/25/22. Justine Null does continue to attend the [employer] education and nutrition series.  Justine Null will benefit from participation in intensive cardiac rehab for nutrition, exercise, and lifestyle modification.             Psychosocial: Target Goals: Acknowledge presence or absence of significant depression and/or stress, maximize coping skills, provide positive support system. Participant is able to verbalize types and ability to use techniques and skills needed for reducing stress and depression.  Initial Review & Psychosocial Screening:  Initial Psych Review & Screening - 02/13/22 1448       Initial Review   Current issues with None Identified      Family Dynamics   Good Support System? Yes      Barriers   Psychosocial barriers to participate in program There are no identifiable barriers or psychosocial needs.      Screening Interventions   Interventions Encouraged to exercise             Quality of Life Scores:  Quality of Life - 02/13/22 1524       Quality of Life   Select Quality of Life      Quality of Life Scores   Health/Function Pre 18.1 %    Socioeconomic Pre 22.14 %    Psych/Spiritual Pre 20.57 %    Family Pre 28.8 %    GLOBAL Pre 21.01 %            Scores of 19 and below usually indicate a poorer quality of life in these areas.  A difference of  2-3 points is a clinically meaningful difference.  A difference of 2-3 points in the total score of  the Quality of Life Index has been associated with significant improvement in overall quality of life, self-image, physical symptoms, and general health in studies assessing change in quality of life.  PHQ-9: Review Flowsheet       02/13/2022 12/26/2021 01/30/2017 10/31/2016  Depression screen PHQ 2/9  Decreased Interest 0 0 0 0  Down, Depressed, Hopeless 0 0 0 0  PHQ - 2 Score 0 0 0 0  Altered sleeping 0 - - -  Tired, decreased energy 1 - - -  Change in appetite 0 - - -  Feeling bad or failure about yourself  0 - - -  Trouble concentrating 0 - - -  Moving slowly or fidgety/restless 0 - - -   Suicidal thoughts 0 - - -  PHQ-9 Score 1 - - -  Difficult doing work/chores Not difficult at all - - -   Interpretation of Total Score  Total Score Depression Severity:  1-4 = Minimal depression, 5-9 = Mild depression, 10-14 = Moderate depression, 15-19 = Moderately severe depression, 20-27 = Severe depression   Psychosocial Evaluation and Intervention:   Psychosocial Re-Evaluation:  Psychosocial Re-Evaluation     [place] Name 02/26/22 1448 03/25/22 1623           Psychosocial Re-Evaluation   Current issues with None Identified None Identified      Comments -- Quality of life questionnaire reviewed. Rowe reports feeling stronger and less short of breath      Interventions Stress management education;Relaxation education;Encouraged to attend Cardiac Rehabilitation for the exercise Stress management education;Relaxation education;Encouraged to attend Cardiac Rehabilitation for the exercise      Continue Psychosocial Services  No Follow up required No Follow up required               Psychosocial Discharge (Final Psychosocial Re-Evaluation):  Psychosocial Re-Evaluation - 03/25/22 1623       Psychosocial Re-Evaluation   Current issues with None Identified    Comments Quality of life questionnaire reviewed. Rowe reports feeling stronger and less short of breath    Interventions Stress management education;Relaxation education;Encouraged to attend Cardiac Rehabilitation for the exercise    Continue Psychosocial Services  No Follow up required             Vocational Rehabilitation: Provide vocational rehab assistance to qualifying candidates.   Vocational Rehab Evaluation & Intervention:  Vocational Rehab - 02/13/22 1459       Initial Vocational Rehab Evaluation & Intervention   Assessment shows need for Vocational Rehabilitation No   Justine Null is retired and does not need vocational rehab at this time.            Education: Education Goals: Education classes will be  provided on a weekly basis, covering required topics. Participant will state understanding/return demonstration of topics presented.    Education     [place] Name 02/26/22 1215     Education   Cardiac Education Topics [employer]   Educator Dietitian   Weekly Topic Fast Evening Meals   Instruction Review Code 1- Verbalizes Understanding   Class Start Time 1140   Class Stop Time 1215   Class Time Calculation (min) 35 min    [place] Name 03/10/22 1600     Education   Cardiac Education Topics [facility]   Environmental consultant Psychosocial   Psychosocial Workshop Other  [employer] to Heart  Instruction Review Code 1- Verbalizes Understanding   Class Start Time 1145   Class Stop Time 1231   Class Time Calculation (min) 46 min    [place] Name 03/12/22 1600     Education   Cardiac Education Topics [facility]   Radio broadcast assistant   Weekly Topic Adding Flavor - Sodium-Free   Instruction Review Code 1- Verbalizes Understanding   Class Start Time 1150   Class Stop Time 1223   Class Time Calculation (min) 33 min    [place] Name 03/14/22 1200     Education   Cardiac Education Topics [facility]   Architect Education   General Education Heart Disease Risk Reduction   Instruction Review Code 1- Verbalizes Understanding   Class Start Time 1147   Class Stop Time 1222   Class Time Calculation (min) 35 min    [place] Name 03/17/22 1400     Education   Cardiac Education Topics Pritikin   Environmental consultant Exercise   Exercise Workshop Hotel manager and Fall Prevention   Instruction Review Code 1- Verbalizes Understanding   Class Start Time 1145   Class Stop Time 1230   Class Time Calculation (min) 45 min    [place]  Name 03/19/22 1300     Education   Cardiac Education Topics Pritikin   Financial trader   Weekly Topic Fast and Healthy Breakfasts   Instruction Review Code 1- Verbalizes Understanding   Class Start Time 1145   Class Stop Time 1226   Class Time Calculation (min) 41 min    [place] Name 03/21/22 1200     Education   Cardiac Education Topics Pritikin   Lexicographer Nutrition   Nutrition Fueling a Healthy Body   Instruction Review Code 1- Verbalizes Understanding   Class Start Time 1150   Class Stop Time 1230   Class Time Calculation (min) 40 min    [place] Name 03/24/22 1700     Education   Cardiac Education Topics Pritikin   [employer]     Workshops   Educator Exercise Physiologist   [facility] Psychosocial   Psychosocial Workshop Recognizing and Reducing Stress   Instruction Review Code 1- Verbalizes Understanding   Class Start Time 1148   Class Stop Time 1233   Class Time Calculation (min) 45 min            Core Videos: Exercise    Move It!  Clinical staff conducted group or individual video education with verbal and written material and guidebook.  Patient learns the recommended Pritikin exercise program. Exercise with the goal of living a long, healthy life. Some of the health benefits of exercise include controlled diabetes, healthier blood pressure levels, improved cholesterol levels, improved heart and lung capacity, improved sleep, and better body composition. Everyone should speak with their doctor before starting or changing an exercise routine.  Biomechanical Limitations Clinical staff conducted group or individual video education with verbal and written material and guidebook.  Patient learns how biomechanical limitations can impact exercise and how we can mitigate and possibly overcome limitations to have an impactful and balanced exercise routine.  Body  Composition Clinical  staff conducted group or individual video education with verbal and written material and guidebook.  Patient learns that body composition (ratio of muscle mass to fat mass) is a key component to assessing overall fitness, rather than body weight alone. Increased fat mass, especially visceral belly fat, can put [place] at increased risk for metabolic syndrome, type 2 diabetes, heart disease, and even death. It is recommended to combine diet and exercise (cardiovascular and resistance training) to improve your body composition. Seek guidance from your physician and exercise physiologist before implementing an exercise routine.  Exercise Action Plan Clinical staff conducted group or individual video education with verbal and written material and guidebook.  Patient learns the recommended strategies to achieve and enjoy long-term exercise adherence, including variety, self-motivation, self-efficacy, and positive decision making. Benefits of exercise include fitness, good health, weight management, more energy, better sleep, less stress, and overall well-being.  Medical   Heart Disease Risk Reduction Clinical staff conducted group or individual video education with verbal and written material and guidebook.  Patient learns our heart is our most vital organ as it circulates oxygen, nutrients, white blood cells, and hormones throughout the entire body, and carries waste away. Data supports a plant-based eating plan like the Pritikin Program for its effectiveness in slowing progression of and reversing heart disease. The video provides a number of recommendations to address heart disease.   Metabolic Syndrome and Belly Fat  Clinical staff conducted group or individual video education with verbal and written material and guidebook.  Patient learns what metabolic syndrome is, how it leads to heart disease, and how one can reverse it and keep it from coming back. You have metabolic syndrome if  you have 3 of the following 5 criteria: abdominal obesity, high blood pressure, high triglycerides, low HDL cholesterol, and high blood sugar.  Hypertension and Heart Disease Clinical staff conducted group or individual video education with verbal and written material and guidebook.  Patient learns that high blood pressure, or hypertension, is very common in the [place]. Hypertension is largely due to excessive salt intake, but other important risk factors include being overweight, physical inactivity, drinking too much alcohol, smoking, and not eating enough potassium from fruits and vegetables. High blood pressure is a leading risk factor for heart attack, stroke, congestive heart failure, dementia, kidney failure, and premature death. Long-term effects of excessive salt intake include stiffening of the arteries and thickening of heart muscle and organ damage. Recommendations include ways to reduce hypertension and the risk of heart disease.  Diseases of Our Time - Focusing on Diabetes Clinical staff conducted group or individual video education with verbal and written material and guidebook.  Patient learns why the best way to stop diseases of our time is prevention, through food and other lifestyle changes. Medicine (such as prescription pills and surgeries) is often only a Band-Aid on the problem, not a long-term solution. Most common diseases of our time include obesity, type 2 diabetes, hypertension, heart disease, and cancer. The Pritikin Program is recommended and has been proven to help reduce, reverse, and/or prevent the damaging effects of metabolic syndrome.  Nutrition   Overview of the Pritikin Eating Plan  Clinical staff conducted group or individual video education with verbal and written material and guidebook.  Patient learns about the [place] for disease risk reduction. The [place] emphasizes a wide variety of unrefined, minimally-processed  carbohydrates, like fruits, vegetables, whole grains, and legumes. Go, Caution, and Stop food choices are explained. Plant-based and  lean animal proteins are emphasized. Rationale provided for low sodium intake for blood pressure control, low added sugars for blood sugar stabilization, and low added fats and oils for coronary artery disease risk reduction and weight management.  Calorie Density  Clinical staff conducted group or individual video education with verbal and written material and guidebook.  Patient learns about calorie density and how it impacts the Pritikin Eating Plan. Knowing the characteristics of the food you choose will help you decide whether those foods will lead to weight gain or weight loss, and whether you want to consume more or less of them. Weight loss is usually a side effect of the Pritikin Eating Plan because of its focus on low calorie-dense foods.  Label Reading  Clinical staff conducted group or individual video education with verbal and written material and guidebook.  Patient learns about the Pritikin recommended label reading guidelines and corresponding recommendations regarding calorie density, added sugars, sodium content, and whole grains.  Dining Out - Part 1  Clinical staff conducted group or individual video education with verbal and written material and guidebook.  Patient learns that restaurant meals can be sabotaging because they can be so high in calories, fat, sodium, and/or sugar. Patient learns recommended strategies on how to positively address this and avoid unhealthy pitfalls.  Facts on Fats  Clinical staff conducted group or individual video education with verbal and written material and guidebook.  Patient learns that lifestyle modifications can be just as effective, if not more so, as many medications for lowering your risk of heart disease. A Pritikin lifestyle can help to reduce your risk of inflammation and atherosclerosis (cholesterol  build-up, or plaque, in the artery walls). Lifestyle interventions such as dietary choices and physical activity address the cause of atherosclerosis. A review of the types of fats and their impact on blood cholesterol levels, along with dietary recommendations to reduce fat intake is also included.  Nutrition Action Plan  Clinical staff conducted group or individual video education with verbal and written material and guidebook.  Patient learns how to incorporate Pritikin recommendations into their lifestyle. Recommendations include planning and keeping personal health goals in mind as an important part of their success.  Healthy Mind-Set    Healthy Minds, Bodies, Hearts  Clinical staff conducted group or individual video education with verbal and written material and guidebook.  Patient learns how to identify when they are stressed. Video will discuss the impact of that stress, as well as the many benefits of stress management. Patient will also be introduced to stress management techniques. The way we think, act, and feel has an impact on our hearts.  How Our Thoughts Can Heal Our Hearts  Clinical staff conducted group or individual video education with verbal and written material and guidebook.  Patient learns that negative thoughts can cause depression and anxiety. This can result in negative lifestyle behavior and serious health problems. Cognitive behavioral therapy is an effective method to help control our thoughts in order to change and improve our emotional outlook.  Additional Videos:  Exercise    Improving Performance  Clinical staff conducted group or individual video education with verbal and written material and guidebook.  Patient learns to use a non-linear approach by alternating intensity levels and lengths of time spent exercising to help burn more calories and lose more body fat. Cardiovascular exercise helps improve heart health, metabolism, hormonal balance, blood sugar  control, and recovery from fatigue. Resistance training improves strength, endurance, balance, coordination, reaction time, metabolism,  and muscle mass. Flexibility exercise improves circulation, posture, and balance. Seek guidance from your physician and exercise physiologist before implementing an exercise routine and learn your capabilities and proper form for all exercise.  Introduction to Yoga  Clinical staff conducted group or individual video education with verbal and written material and guidebook.  Patient learns about yoga, a discipline of the coming together of mind, breath, and body. The benefits of yoga include improved flexibility, improved range of motion, better posture and core strength, increased lung function, weight loss, and positive self-image. Yoga's heart health benefits include lowered blood pressure, healthier heart rate, decreased cholesterol and triglyceride levels, improved immune function, and reduced stress. Seek guidance from your physician and exercise physiologist before implementing an exercise routine and learn your capabilities and proper form for all exercise.  Medical   Aging: Enhancing Your Quality of Life  Clinical staff conducted group or individual video education with verbal and written material and guidebook.  Patient learns key strategies and recommendations to stay in good physical health and enhance quality of life, such as prevention strategies, having an advocate, securing a [place], and keeping a list of medications and system for tracking them. It also discusses how to avoid risk for bone loss.  Biology of Weight Control  Clinical staff conducted group or individual video education with verbal and written material and guidebook.  Patient learns that weight gain occurs because we consume more calories than we burn (eating more, moving less). Even if your body weight is normal, you may have higher ratios of fat compared to  muscle mass. Too much body fat puts you at increased risk for cardiovascular disease, heart attack, stroke, type 2 diabetes, and obesity-related cancers. In addition to exercise, following the [place] can help reduce your risk.  Decoding Lab Results  Clinical staff conducted group or individual video education with verbal and written material and guidebook.  Patient learns that lab test reflects one measurement whose values change over time and are influenced by many factors, including medication, stress, sleep, exercise, food, hydration, pre-existing medical conditions, and more. It is recommended to use the knowledge from this video to become more involved with your lab results and evaluate your numbers to speak with your doctor.   Diseases of Our Time - Overview  Clinical staff conducted group or individual video education with verbal and written material and guidebook.  Patient learns that according to the CDC, 50% to 70% of chronic diseases (such as obesity, type 2 diabetes, elevated lipids, hypertension, and heart disease) are avoidable through lifestyle improvements including healthier food choices, listening to satiety cues, and increased physical activity.  Sleep Disorders Clinical staff conducted group or individual video education with verbal and written material and guidebook.  Patient learns how good quality and duration of sleep are important to overall health and well-being. Patient also learns about sleep disorders and how they impact health along with recommendations to address them, including discussing with a physician.  Nutrition  Dining Out - Part 2 Clinical staff conducted group or individual video education with verbal and written material and guidebook.  Patient learns how to plan ahead and communicate in order to maximize their dining experience in a healthy and nutritious manner. Included are recommended food choices based on the type of restaurant the patient  is visiting.   Fueling a Best boy conducted group or individual video education with verbal and written material and guidebook.  There is a strong connection between our food choices and our health. Diseases like obesity and type 2 diabetes are very prevalent and are in large-part due to lifestyle choices. The Pritikin Eating Plan provides plenty of food and hunger-curbing satisfaction. It is easy to follow, affordable, and helps reduce health risks.  Menu Workshop  Clinical staff conducted group or individual video education with verbal and written material and guidebook.  Patient learns that restaurant meals can sabotage health goals because they are often packed with calories, fat, sodium, and sugar. Recommendations include strategies to plan ahead and to communicate with the manager, chef, or server to help order a healthier meal.  Planning Your Eating Strategy  Clinical staff conducted group or individual video education with verbal and written material and guidebook.  Patient learns about the [place] and its benefit of reducing the risk of disease. The [place] does not focus on calories. Instead, it emphasizes high-quality, nutrient-rich foods. By knowing the characteristics of the foods, we choose, we can determine their calorie density and make informed decisions.  Targeting Your Nutrition Priorities  Clinical staff conducted group or individual video education with verbal and written material and guidebook.  Patient learns that lifestyle habits have a tremendous impact on disease risk and progression. This video provides eating and physical activity recommendations based on your personal health goals, such as reducing LDL cholesterol, losing weight, preventing or controlling type 2 diabetes, and reducing high blood pressure.  Vitamins and Minerals  Clinical staff conducted group or individual video education with verbal and written material  and guidebook.  Patient learns different ways to obtain key vitamins and minerals, including through a recommended healthy diet. It is important to discuss all supplements you take with your doctor.   Healthy Mind-Set    Smoking Cessation  Clinical staff conducted group or individual video education with verbal and written material and guidebook.  Patient learns that cigarette smoking and tobacco addiction pose a serious health risk which affects millions of people. Stopping smoking will significantly reduce the risk of heart disease, lung disease, and many forms of cancer. Recommended strategies for quitting are covered, including working with your doctor to develop a successful plan.  Culinary   Becoming a Financial trader conducted group or individual video education with verbal and written material and guidebook.  Patient learns that cooking at home can be healthy, cost-effective, quick, and puts them in control. Keys to cooking healthy recipes will include looking at your recipe, assessing your equipment needs, planning ahead, making it simple, choosing cost-effective seasonal ingredients, and limiting the use of added fats, salts, and sugars.  Cooking - Breakfast and Snacks  Clinical staff conducted group or individual video education with verbal and written material and guidebook.  Patient learns how important breakfast is to satiety and nutrition through the entire day. Recommendations include key foods to eat during breakfast to help stabilize blood sugar levels and to prevent overeating at meals later in the day. Planning ahead is also a key component.  Cooking - Human resources officer conducted group or individual video education with verbal and written material and guidebook.  Patient learns eating strategies to improve overall health, including an approach to cook more at home. Recommendations include thinking of animal protein as a side on your plate rather  than center stage and focusing instead on lower calorie dense options like vegetables, fruits, whole grains, and plant-based proteins, such as beans. Making sauces  in large quantities to freeze for later and leaving the skin on your vegetables are also recommended to maximize your experience.  Cooking - Healthy Salads and Dressing Clinical staff conducted group or individual video education with verbal and written material and guidebook.  Patient learns that vegetables, fruits, whole grains, and legumes are the foundations of the [place]. Recommendations include how to incorporate each of these in flavorful and healthy salads, and how to create homemade salad dressings. Proper handling of ingredients is also covered. Cooking - Soups and [employer] - Soups and Desserts Clinical staff conducted group or individual video education with verbal and written material and guidebook.  Patient learns that Pritikin soups and desserts make for easy, nutritious, and delicious snacks and meal components that are low in sodium, fat, sugar, and calorie density, while high in vitamins, minerals, and filling fiber. Recommendations include simple and healthy ideas for soups and desserts.   Overview     The Pritikin Solution Program Overview Clinical staff conducted group or individual video education with verbal and written material and guidebook.  Patient learns that the results of the [place] Program have been documented in more than 100 articles published in peer-reviewed journals, and the benefits include reducing risk factors for (and, in some cases, even reversing) high cholesterol, high blood pressure, type 2 diabetes, obesity, and more! An overview of the three key pillars of the Pritikin Program will be covered: eating well, doing regular exercise, and having a healthy mind-set.  WORKSHOPS  Exercise: Exercise Basics: Building Your Action Plan Clinical staff led group instruction and  group discussion with PowerPoint presentation and patient guidebook. To enhance the learning environment the use of posters, models and videos may be added. At the conclusion of this workshop, patients will comprehend the difference between physical activity and exercise, as well as the benefits of incorporating both, into their routine. Patients will understand the FITT (Frequency, Intensity, Time, and Type) principle and how to use it to build an exercise action plan. In addition, safety concerns and other considerations for exercise and cardiac rehab will be addressed by the presenter. The purpose of this lesson is to promote a comprehensive and effective weekly exercise routine in order to improve patients' overall level of fitness.   Managing Heart Disease: Your Path to a Healthier Heart Clinical staff led group instruction and group discussion with PowerPoint presentation and patient guidebook. To enhance the learning environment the use of posters, models and videos may be added.At the conclusion of this workshop, patients will understand the anatomy and physiology of the heart. Additionally, they will understand how Pritikin's three pillars impact the risk factors, the progression, and the management of heart disease.  The purpose of this lesson is to provide a high-level overview of the heart, heart disease, and how the Pritikin lifestyle positively impacts risk factors.  Exercise Biomechanics Clinical staff led group instruction and group discussion with PowerPoint presentation and patient guidebook. To enhance the learning environment the use of posters, models and videos may be added. Patients will learn how the structural parts of their bodies function and how these functions impact their daily activities, movement, and exercise. Patients will learn how to promote a neutral spine, learn how to manage pain, and identify ways to improve their physical movement in order to  promote healthy living. The purpose of this lesson is to expose patients to common physical limitations that impact physical activity. Participants will learn practical ways to adapt and manage aches  and pains, and to minimize their effect on regular exercise. Patients will learn how to maintain good posture while sitting, walking, and lifting.  Balance Training and Fall Prevention  Clinical staff led group instruction and group discussion with PowerPoint presentation and patient guidebook. To enhance the learning environment the use of posters, models and videos may be added. At the conclusion of this workshop, patients will understand the importance of their sensorimotor skills (vision, proprioception, and the vestibular system) in maintaining their ability to balance as they age. Patients will apply a variety of balancing exercises that are appropriate for their current level of function. Patients will understand the common causes for poor balance, possible solutions to these problems, and ways to modify their physical environment in order to minimize their fall risk. The purpose of this lesson is to teach patients about the importance of maintaining balance as they age and ways to minimize their risk of falling.  WORKSHOPS   Nutrition:  Fueling a Scientist, research (physical sciences) led group instruction and group discussion with PowerPoint presentation and patient guidebook. To enhance the learning environment the use of posters, models and videos may be added. Patients will review the foundational principles of the [place] and understand what constitutes a serving size in each of the food groups. Patients will also learn Pritikin-friendly foods that are better choices when away from home and review make-ahead meal and snack options. Calorie density will be reviewed and applied to three nutrition priorities: weight maintenance, weight loss, and weight gain. The purpose of this lesson is to  reinforce (in a group setting) the key concepts around what patients are recommended to eat and how to apply these guidelines when away from home by planning and selecting Pritikin-friendly options. Patients will understand how calorie density may be adjusted for different weight management goals.  Mindful Eating  Clinical staff led group instruction and group discussion with PowerPoint presentation and patient guidebook. To enhance the learning environment the use of posters, models and videos may be added. Patients will briefly review the concepts of the [place] [employer] [place] and the importance of low-calorie dense foods. The concept of mindful eating will be introduced as well as the importance of paying attention to internal hunger signals. Triggers for non-hunger eating and techniques for dealing with triggers will be explored. The purpose of this lesson is to provide patients with the opportunity to review the basic principles of the [place], discuss the value of eating mindfully and how to measure internal cues of hunger and fullness using the Hunger Scale. Patients will also discuss reasons for non-hunger eating and learn strategies to use for controlling emotional eating.  Targeting Your Nutrition Priorities Clinical staff led group instruction and group discussion with PowerPoint presentation and patient guidebook. To enhance the learning environment the use of posters, models and videos may be added. Patients will learn how to determine their genetic susceptibility to disease by reviewing their family history. Patients will gain insight into the importance of diet as part of an overall healthy lifestyle in mitigating the impact of genetics and other environmental insults. The purpose of this lesson is to provide patients with the opportunity to assess their personal nutrition priorities by looking at their family history, their own health history and current risk factors. Patients will  also be able to discuss ways of prioritizing and modifying the [place] for their highest risk areas  Menu  Clinical staff led group instruction and group discussion with  PowerPoint presentation and patient guidebook. To enhance the learning environment the use of posters, models and videos may be added. Using menus brought in from [employer], or printed from [employer], patients will apply the [place] dining out guidelines that were presented in the [employer] video. Patients will also be able to practice these guidelines in a variety of provided scenarios. The purpose of this lesson is to provide patients with the opportunity to practice hands-on learning of the [place] with actual menus and practice scenarios.  Label Reading Clinical staff led group instruction and group discussion with PowerPoint presentation and patient guidebook. To enhance the learning environment the use of posters, models and videos may be added. Patients will review and discuss the [facility] label reading guidelines presented in [employer]'s Label Reading Educational series video. Using fool labels brought in from local grocery stores and markets, patients will apply the label reading guidelines and determine if the packaged food meet the [facility] guidelines. The purpose of this lesson is to provide patients with the opportunity to review, discuss, and practice hands-on learning of the [facility] Label Reading guidelines with actual packaged food labels. [place] Workshops are designed to teach patients ways to prepare quick, simple, and affordable recipes at home. The importance of nutrition's role in chronic disease risk reduction is reflected in its emphasis in the overall [facility] program. By learning how to prepare essential core [facility] Eating Plan recipes, patients will increase control over what they eat; be able to customize the  flavor of foods without the use of added salt, sugar, or fat; and improve the quality of the food they consume. By learning a set of core recipes which are easily assembled, quickly prepared, and affordable, patients are more likely to prepare more healthy foods at home. These workshops focus on convenient breakfasts, simple entres, side dishes, and desserts which can be prepared with minimal effort and are consistent with nutrition recommendations for cardiovascular risk reduction. Cooking [employer] are taught by a Engineer, materials (RD) who has been trained by the [employer]. The chef or RD has a clear understanding of the importance of minimizing - if not completely eliminating - added fat, sugar, and sodium in recipes. Throughout the series of [place] Workshop sessions, patients will learn about healthy ingredients and efficient methods of cooking to build confidence in their capability to prepare    Cooking School weekly topics:  Adding Flavor- Sodium-Free  Fast and Healthy Breakfasts  Powerhouse Plant-Based Proteins  Satisfying Salads and Dressings  Simple Sides and Sauces  International Cuisine-Spotlight on the [place] Zones  Delicious Desserts  Savory Soups  [employer] - Meals in a Agricultural consultant Appetizers and Snacks  Comforting Weekend Breakfasts  One-Pot Wonders   Fast Evening Meals  Contractor Your [facility] Plate  WORKSHOPS   Healthy Mindset (Psychosocial):  Focused Goals, Sustainable Changes Clinical staff led group instruction and group discussion with PowerPoint presentation and patient guidebook. To enhance the learning environment the use of posters, models and videos may be added. Patients will be able to apply effective goal setting strategies to establish at least one personal goal, and then take consistent, meaningful action toward that goal. They will learn to identify common barriers to achieving  personal goals and develop strategies to overcome them. Patients will also gain an understanding of how our mind-set can impact our ability to achieve goals and the importance  of cultivating a positive and growth-oriented mind-set. The purpose of this lesson is to provide patients with a deeper understanding of how to set and achieve personal goals, as well as the tools and strategies needed to overcome common obstacles which may arise along the way.  From Head to Heart: The Power of a Healthy Outlook  Clinical staff led group instruction and group discussion with PowerPoint presentation and patient guidebook. To enhance the learning environment the use of posters, models and videos may be added. Patients will be able to recognize and describe the impact of emotions and mood on physical health. They will discover the importance of self-care and explore self-care practices which may work for them. Patients will also learn how to utilize the 4 C's to cultivate a healthier outlook and better manage stress and challenges. The purpose of this lesson is to demonstrate to patients how a healthy outlook is an essential part of maintaining good health, especially as they continue their cardiac rehab journey.  Healthy Sleep for a Healthy Heart Clinical staff led group instruction and group discussion with PowerPoint presentation and patient guidebook. To enhance the learning environment the use of posters, models and videos may be added. At the conclusion of this workshop, patients will be able to demonstrate knowledge of the importance of sleep to overall health, well-being, and quality of life. They will understand the symptoms of, and treatments for, common sleep disorders. Patients will also be able to identify daytime and nighttime behaviors which impact sleep, and they will be able to apply these tools to help manage sleep-related challenges. The purpose of this lesson is to provide patients with a general  overview of sleep and outline the importance of quality sleep. Patients will learn about a few of the most common sleep disorders. Patients will also be introduced to the concept of "sleep hygiene," and discover ways to self-manage certain sleeping problems through simple daily behavior changes. Finally, the workshop will motivate patients by clarifying the links between quality sleep and their goals of heart-healthy living.   Recognizing and Reducing Stress Clinical staff led group instruction and group discussion with PowerPoint presentation and patient guidebook. To enhance the learning environment the use of posters, models and videos may be added. At the conclusion of this workshop, patients will be able to understand the types of stress reactions, differentiate between acute and chronic stress, and recognize the impact that chronic stress has on their health. They will also be able to apply different coping mechanisms, such as reframing negative self-talk. Patients will have the opportunity to practice a variety of stress management techniques, such as deep abdominal breathing, progressive muscle relaxation, and/or guided imagery.  The purpose of this lesson is to educate patients on the role of stress in their lives and to provide healthy techniques for coping with it.  Learning Barriers/Preferences:  Learning Barriers/Preferences - 02/13/22 1525       Learning Barriers/Preferences   Learning Barriers Hearing    Learning Preferences Audio;Computer/Internet;Group Instruction;Individual Instruction;Pictoral;Skilled Demonstration;Verbal Instruction;Video;Written Material             Education Topics:  Knowledge Questionnaire Score:  Knowledge Questionnaire Score - 02/13/22 1526       Knowledge Questionnaire Score   Pre Score 24/24             Core Components/Risk Factors/Patient Goals at Admission:  Personal Goals and Risk Factors at Admission - 02/13/22 1526       Core  Components/Risk Factors/Patient Goals on Admission  Weight Management Yes;Weight Loss    Intervention Weight Management: Develop a combined nutrition and exercise program designed to reach desired caloric intake, while maintaining appropriate intake of nutrient and fiber, sodium and fats, and appropriate energy expenditure required for the weight goal.;Weight Management: Provide education and appropriate resources to help participant work on and attain dietary goals.;Weight Management/Obesity: Establish reasonable short term and long term weight goals.;Obesity: Provide education and appropriate resources to help participant work on and attain dietary goals.    Admit Weight 219 lb 11.2 oz (99.7 kg)    Goal Weight: Long Term 190 lb (86.2 kg)    Expected Outcomes Short Term: Continue to assess and modify interventions until short term weight is achieved;Long Term: Adherence to nutrition and physical activity/exercise program aimed toward attainment of established weight goal;Weight Loss: Understanding of general recommendations for a balanced deficit meal plan, which promotes 1-2 lb weight loss per week and includes a negative energy balance of [phone] kcal/d;Understanding recommendations for meals to include 15-35% energy as protein, 25-35% energy from fat, 35-60% energy from carbohydrates, less than 200mg  of dietary cholesterol, 20-35 gm of total fiber daily;Understanding of distribution of calorie intake throughout the day with the consumption of 4-5 meals/snacks    Hypertension Yes    Intervention Monitor prescription use compliance.;Provide education on lifestyle modifcations including regular physical activity/exercise, weight management, moderate sodium restriction and increased consumption of fresh fruit, vegetables, and low fat dairy, alcohol moderation, and smoking cessation.    Expected Outcomes Short Term: Continued assessment and intervention until BP is < 140/57mm HG in hypertensive  participants. < 130/56mm HG in hypertensive participants with diabetes, heart failure or chronic kidney disease.;Long Term: Maintenance of blood pressure at goal levels.    Lipids Yes    Intervention Provide education and support for participant on nutrition & aerobic/resistive exercise along with prescribed medications to achieve LDL 70mg , HDL >40mg .    Expected Outcomes Short Term: Participant states understanding of desired cholesterol values and is compliant with medications prescribed. Participant is following exercise prescription and nutrition guidelines.;Long Term: Cholesterol controlled with medications as prescribed, with individualized exercise RX and with personalized nutrition plan. Value goals: LDL < 70mg , HDL > 40 mg.    Personal Goal Other Yes    Personal Goal long and short the same: Flexibility, stamina and wt loss (goal 190 lbs), wants RD contult and more variety with food    Intervention Will continue to monitor pt and progress workloads as tolerated without sign or symptom    Expected Outcomes Pt will achieve his goals             Core Components/Risk Factors/Patient Goals Review:   Goals and Risk Factor Review     Row Name 02/26/22 1449 03/25/22 1626           Core Components/Risk Factors/Patient Goals Review   Personal Goals Review Weight Management/Obesity;Hypertension;Lipids Weight Management/Obesity;Hypertension;Lipids      Review Rowe started intensive cardiac rehab on 02/26/22 and did well with exercise. Vital signs were stable Justine Null is doing  well with exercise at intensive cardiac rehab . Vital signs have been  stable      Expected Outcomes Roew will continue to participate in intensive cardiac rehab for exercise, nutrition and lifestyle modifications Roew will continue to participate in intensive cardiac rehab for exercise, nutrition and lifestyle modifications               Core Components/Risk Factors/Patient Goals at Discharge (Final Review):    Goals and  Risk Factor Review - 03/25/22 1626       Core Components/Risk Factors/Patient Goals Review   Personal Goals Review Weight Management/Obesity;Hypertension;Lipids    Review Justine Null is doing  well with exercise at intensive cardiac rehab . Vital signs have been  stable    Expected Outcomes Roew will continue to participate in intensive cardiac rehab for exercise, nutrition and lifestyle modifications             ITP Comments:  ITP Comments     Row Name 02/13/22 1445 02/26/22 1447 03/25/22 1622       ITP Comments Dr Loreta Ave MD, Medical Director. Introduction to [facility] Education/Intensive cardiac rehab. Initial Orientation Packet Reviewed with the patient 30 Day ITP Review. Rowe started intensive caridac rehab on 02/26/22 and did well with exercise 30 Day ITP Review. Justine Null has good attendance and participation in  intensive caridac rehab              Comments: See ITP Comments

## 2022-03-27 ENCOUNTER — Ambulatory Visit (INDEPENDENT_AMBULATORY_CARE_PROVIDER_SITE_OTHER): Payer: Medicare Other | Admitting: Nurse Practitioner

## 2022-03-27 ENCOUNTER — Telehealth: Payer: Self-pay | Admitting: Nurse Practitioner

## 2022-03-27 ENCOUNTER — Encounter: Payer: Self-pay | Admitting: Nurse Practitioner

## 2022-03-27 VITALS — BP 116/76 | HR 92 | Temp 97.6°F | Resp 16 | Ht 67.0 in | Wt 221.5 lb

## 2022-03-27 DIAGNOSIS — G479 Sleep disorder, unspecified: Secondary | ICD-10-CM | POA: Diagnosis not present

## 2022-03-27 DIAGNOSIS — Z951 Presence of aortocoronary bypass graft: Secondary | ICD-10-CM | POA: Diagnosis not present

## 2022-03-27 DIAGNOSIS — E785 Hyperlipidemia, unspecified: Secondary | ICD-10-CM

## 2022-03-27 DIAGNOSIS — I214 Non-ST elevation (NSTEMI) myocardial infarction: Secondary | ICD-10-CM

## 2022-03-27 NOTE — Progress Notes (Signed)
Established Patient Office Visit  Subjective   Patient ID: Keith Murillo, male    DOB: [birth year]  Age: 72 y.o. MRN: [id number]  Chief Complaint  Patient presents with   Medical Management of Chronic Issues    Recheck    HPI   Insomnia: patient was having trouble with sleep after having a CABG x3. States that he goes to bed after midnight and gets up when ever he wakes up. States that he was waking up a couple times a night but now he will get up around 5-6 and then go back to sleep   OSA: At last office visit he was not using CPAP as he has lost weight. Has not had a repeat sleep study   CABG: was followed by Haig Prophet, MD. He was recently evaluated and they did a CT scan of the chest and did not show an effusion. States that when he lays down he feels short of breath breifly. States that it will resolve. He is followed. States that Dr Rockey Situ wanted him to start the [place].     Review of Systems  Constitutional:  Negative for chills and fever.  Respiratory:  Positive for shortness of breath (improving).   Cardiovascular:  Positive for leg swelling. Negative for chest pain.  Neurological:  Negative for dizziness and headaches.      Objective:     BP 116/76   Pulse 92   Temp 97.6 F (36.4 C)   Resp 16   Ht 5\' 7"  (1.702 m)   Wt 221 lb 8 oz (100.5 kg)   SpO2 97%   BMI 34.69 kg/m  BP Readings from Last 3 Encounters:  03/27/22 116/76  02/25/22 130/80  02/20/22 (!) 155/75   Wt Readings from Last 3 Encounters:  03/27/22 221 lb 8 oz (100.5 kg)  02/25/22 219 lb 6 oz (99.5 kg)  02/20/22 217 lb (98.4 kg)      Physical Exam Vitals and nursing note reviewed.  Constitutional:      Appearance: Normal appearance.  Cardiovascular:     Rate and Rhythm: Normal rate and regular rhythm.     Heart sounds: Normal heart sounds.  Pulmonary:     Effort: Pulmonary effort is normal.     Breath sounds: Normal breath sounds.  Neurological:     Mental Status: He is  alert.      No results found for any visits on 03/27/22.    The ASCVD Risk score (Arnett DK, et al., 2019) failed to calculate for the following reasons:   The patient has a prior MI or stroke diagnosis    Assessment & Plan:   Problem List Items Addressed This Visit       Cardiovascular and Mediastinum   NSTEMI (non-ST elevated myocardial infarction) [facility]) - Primary    Patient is followed by Dr. Rockey Situ cardiology.  He has underwent CABG x 3.  Patient is statin intolerant was supposed to start [place] but has not gotten it yet.  States he stopped taking his Zetia        Other   Hyperlipidemia    Patient is statin intolerant was prescribed Zetia but has since stopped.  Was prescribed Repatha but has not got it yet did inform patient reach out to pharmacy to see if it is available we will send cardiologist a note to keep him up-to-date      Hx of CABG    Following with Luanne Bras, MD cardiothoracic surgeon.  Recently underwent  CT scan of chest that did not show any pleural effusion.  Follow-up with CT surgeon as recommended      Sleep disturbance    Patient feels like his sleep disturbance has improved since last office visit.  Stable       Return in about 6 months (around 09/27/2022) for CPE and Labs.    Romilda Garret, NP

## 2022-03-27 NOTE — Assessment & Plan Note (Signed)
Patient is followed by Dr. Rockey Situ cardiology.  He has underwent CABG x 3.  Patient is statin intolerant was supposed to start [place] but has not gotten it yet.  States he stopped taking his Zetia

## 2022-03-27 NOTE — Assessment & Plan Note (Signed)
Patient is statin intolerant was prescribed Zetia but has since stopped.  Was prescribed Repatha but has not got it yet did inform patient reach out to pharmacy to see if it is available we will send cardiologist a note to keep him up-to-date

## 2022-03-27 NOTE — Telephone Encounter (Signed)
Dr. Rockey Situ,  Say Rowe in office today. States that he has not started Repatha yet. I told him to check with the pharmacy. He also told me he was getting an upset stomach while taking Zetia and has since stopped taking it. He has not let your office know  Thanks, Catalina Antigua

## 2022-03-27 NOTE — Patient Instructions (Signed)
Nice to see you today Call the [facility] and check about the medication called Repatha I would like Dr. Rockey Situ know about the Zetia. Follow up with me in 6 months for your physical and labs, sooner if you need me

## 2022-03-27 NOTE — Assessment & Plan Note (Signed)
Following with Luanne Bras, MD cardiothoracic surgeon.  Recently underwent CT scan of chest that did not show any pleural effusion.  Follow-up with CT surgeon as recommended

## 2022-03-27 NOTE — Assessment & Plan Note (Signed)
Patient feels like his sleep disturbance has improved since last office visit.  Stable

## 2022-03-28 ENCOUNTER — Encounter (HOSPITAL_COMMUNITY)
Admission: RE | Admit: 2022-03-28 | Discharge: 2022-03-28 | Disposition: A | Discharge status: Home / Self Care | Payer: Medicare Other | Source: Ambulatory Visit | Attending: Cardiovascular Disease | Admitting: Cardiovascular Disease

## 2022-03-28 DIAGNOSIS — Z48812 Encounter for surgical aftercare following surgery on the circulatory system: Secondary | ICD-10-CM | POA: Diagnosis not present

## 2022-03-28 DIAGNOSIS — Z951 Presence of aortocoronary bypass graft: Secondary | ICD-10-CM | POA: Diagnosis not present

## 2022-03-31 ENCOUNTER — Encounter (HOSPITAL_COMMUNITY)
Admission: RE | Admit: 2022-03-31 | Discharge: 2022-03-31 | Disposition: A | Discharge status: Home / Self Care | Payer: Medicare Other | Source: Ambulatory Visit | Attending: Cardiovascular Disease | Admitting: Cardiovascular Disease

## 2022-03-31 DIAGNOSIS — Z48812 Encounter for surgical aftercare following surgery on the circulatory system: Secondary | ICD-10-CM | POA: Diagnosis not present

## 2022-03-31 DIAGNOSIS — Z951 Presence of aortocoronary bypass graft: Secondary | ICD-10-CM

## 2022-04-02 ENCOUNTER — Encounter (HOSPITAL_COMMUNITY)
Admission: RE | Admit: 2022-04-02 | Discharge: 2022-04-02 | Disposition: A | Discharge status: Home / Self Care | Payer: Medicare Other | Source: Ambulatory Visit | Attending: Cardiovascular Disease | Admitting: Cardiovascular Disease

## 2022-04-02 DIAGNOSIS — Z48812 Encounter for surgical aftercare following surgery on the circulatory system: Secondary | ICD-10-CM | POA: Diagnosis not present

## 2022-04-02 DIAGNOSIS — Z951 Presence of aortocoronary bypass graft: Secondary | ICD-10-CM

## 2022-04-03 ENCOUNTER — Other Ambulatory Visit: Payer: Self-pay | Admitting: Thoracic Surgery (Cardiothoracic Vascular Surgery)

## 2022-04-04 ENCOUNTER — Encounter (HOSPITAL_COMMUNITY)
Admission: RE | Admit: 2022-04-04 | Discharge: 2022-04-04 | Disposition: A | Discharge status: Home / Self Care | Payer: Medicare Other | Source: Ambulatory Visit | Attending: Cardiovascular Disease | Admitting: Cardiovascular Disease

## 2022-04-04 DIAGNOSIS — Z951 Presence of aortocoronary bypass graft: Secondary | ICD-10-CM

## 2022-04-04 DIAGNOSIS — Z48812 Encounter for surgical aftercare following surgery on the circulatory system: Secondary | ICD-10-CM | POA: Diagnosis not present

## 2022-04-06 ENCOUNTER — Other Ambulatory Visit: Payer: Self-pay | Admitting: Thoracic Surgery (Cardiothoracic Vascular Surgery)

## 2022-04-06 ENCOUNTER — Other Ambulatory Visit: Payer: Self-pay | Admitting: Physician Assistant

## 2022-04-07 ENCOUNTER — Telehealth: Payer: Self-pay | Admitting: Cardiovascular Disease

## 2022-04-07 ENCOUNTER — Encounter (HOSPITAL_COMMUNITY)
Admission: RE | Admit: 2022-04-07 | Discharge: 2022-04-07 | Disposition: A | Discharge status: Home / Self Care | Payer: Medicare Other | Source: Ambulatory Visit | Attending: Cardiovascular Disease | Admitting: Cardiovascular Disease

## 2022-04-07 DIAGNOSIS — Z951 Presence of aortocoronary bypass graft: Secondary | ICD-10-CM | POA: Diagnosis not present

## 2022-04-07 MED ORDER — CLOPIDOGREL BISULFATE 75 MG PO TABS: 75.0000 mg | ORAL_TABLET | Freq: Every day | ORAL | 0 refills | Status: DC

## 2022-04-07 NOTE — Telephone Encounter (Signed)
Requested Prescriptions   Signed Prescriptions Disp Refills   clopidogrel (PLAVIX) 75 MG tablet 90 tablet 0    Sig: Take 1 tablet (75 mg total) by mouth daily.    Authorizing Provider: GOLLAN, TIMOTHY J    Ordering User: NEWCOMER MCCLAIN, Lennell Shanks L    

## 2022-04-07 NOTE — Telephone Encounter (Signed)
*  STAT* If patient is at the pharmacy, call can be transferred to refill team.   1. Which medications need to be refilled? (please list name of each medication and dose if known) clopidogrel (PLAVIX) 75 MG tablet   2. Which pharmacy/location (including street and city if local pharmacy) is medication to be sent to? [id number] - [place], [place] - [address] AT [place]   3. Do they need a 30 day or 90 day supply? 90 day  Pt states that he was prescribed this medication post heart bypass. He states he was told to call in to see if Dr. Rockey Situ wants to continue it. Please advise.

## 2022-04-09 ENCOUNTER — Encounter (HOSPITAL_COMMUNITY)
Admission: RE | Admit: 2022-04-09 | Discharge: 2022-04-09 | Disposition: A | Discharge status: Home / Self Care | Payer: Medicare Other | Source: Ambulatory Visit | Attending: Cardiovascular Disease | Admitting: Cardiovascular Disease

## 2022-04-09 DIAGNOSIS — Z951 Presence of aortocoronary bypass graft: Secondary | ICD-10-CM | POA: Diagnosis not present

## 2022-04-11 ENCOUNTER — Encounter (HOSPITAL_COMMUNITY)
Admission: RE | Admit: 2022-04-11 | Discharge: 2022-04-11 | Disposition: A | Discharge status: Home / Self Care | Payer: Medicare Other | Source: Ambulatory Visit | Attending: Cardiovascular Disease | Admitting: Cardiovascular Disease

## 2022-04-11 DIAGNOSIS — Z951 Presence of aortocoronary bypass graft: Secondary | ICD-10-CM

## 2022-04-14 ENCOUNTER — Encounter (HOSPITAL_COMMUNITY): Payer: Medicare Other

## 2022-04-16 ENCOUNTER — Encounter (HOSPITAL_COMMUNITY)
Admission: RE | Admit: 2022-04-16 | Discharge: 2022-04-16 | Disposition: A | Discharge status: Home / Self Care | Payer: Medicare Other | Source: Ambulatory Visit | Attending: Cardiovascular Disease | Admitting: Cardiovascular Disease

## 2022-04-16 DIAGNOSIS — Z951 Presence of aortocoronary bypass graft: Secondary | ICD-10-CM | POA: Diagnosis not present

## 2022-04-18 ENCOUNTER — Encounter (HOSPITAL_COMMUNITY)
Admission: RE | Admit: 2022-04-18 | Discharge: 2022-04-18 | Disposition: A | Discharge status: Home / Self Care | Payer: Medicare Other | Source: Ambulatory Visit | Attending: Cardiovascular Disease | Admitting: Cardiovascular Disease

## 2022-04-18 ENCOUNTER — Telehealth: Payer: Self-pay | Admitting: Cardiovascular Disease

## 2022-04-18 DIAGNOSIS — Z951 Presence of aortocoronary bypass graft: Secondary | ICD-10-CM | POA: Diagnosis not present

## 2022-04-18 NOTE — Telephone Encounter (Signed)
Pt c/o medication issue:  1. Name of Medication: Evolocumab (REPATHA SURECLICK) 140 MG/ML SOAJ   2. How are you currently taking this medication (dosage and times per day)?   3. Are you having a reaction (difficulty breathing--STAT)?   4. What is your medication issue? Mega with [employer] called in regards to a PA on the medication. She states the pharmacy in which the medication was called in to, did the PA on 4/9, but states the pharmacy has had trouble faxing it to our office. Mega states the pharmacy was faxing it to [phone]. Provided her with fax number for the office, [phone]. Mega stated that she provide that fax number so it can be run again. She stated that if the office wants to check on the PA, the office can call [phone] and provide reference number - [id number]. She stated that if nothing is heard back from the office, then [employer] will call the pharmacy to see if the medication can be run through at the time.

## 2022-04-21 ENCOUNTER — Other Ambulatory Visit: Payer: Self-pay | Admitting: Thoracic Surgery (Cardiothoracic Vascular Surgery)

## 2022-04-21 ENCOUNTER — Encounter (HOSPITAL_COMMUNITY)
Admission: RE | Admit: 2022-04-21 | Discharge: 2022-04-21 | Disposition: A | Discharge status: Home / Self Care | Payer: Medicare Other | Source: Ambulatory Visit | Attending: Cardiovascular Disease | Admitting: Cardiovascular Disease

## 2022-04-21 DIAGNOSIS — Z951 Presence of aortocoronary bypass graft: Secondary | ICD-10-CM

## 2022-04-21 NOTE — Progress Notes (Signed)
Cardiac Individual Treatment Plan  Patient Details  Name: Keith Murillo MRN: [id number] Date of Birth: 11/02/1950 Referring Provider:   Flowsheet Row INTENSIVE CARDIAC REHAB ORIENT from 02/13/2022 in [facility]  Referring Provider Dr. Julien Nordmann, MD       Initial Encounter Date:  Flowsheet Row INTENSIVE CARDIAC REHAB ORIENT from 02/13/2022 in [facility] Heart, [facility], [facility]  Date 02/13/22       Visit Diagnosis: 12/06/21 S/P CABG x 3  Patient's Home Medications on Admission:  Current Outpatient Medications:    acetaminophen (TYLENOL) 500 MG tablet, Take 2 tablets (1,000 mg total) by mouth every 6 (six) hours as needed., Disp: 30 tablet, Rfl: 0   aspirin EC 81 MG tablet, Take 1 tablet (81 mg total) by mouth daily., Disp: , Rfl:    Cholecalciferol (VITAMIN D-3) 125 MCG (5000 UT) TABS, Take 5,000 Units by mouth daily. , Disp: , Rfl:    clopidogrel (PLAVIX) 75 MG tablet, Take 1 tablet (75 mg total) by mouth daily., Disp: 90 tablet, Rfl: 0   Coenzyme Q10 ([employer] COQ-10) 200 MG capsule, Take 200 mg by mouth daily., Disp: , Rfl:    DHEA 25 MG CAPS, Take 25 mg by mouth daily. , Disp: , Rfl:    diltiazem (CARDIZEM SR) 120 MG 12 hr capsule, Take 1 capsule (120 mg total) by mouth every 12 (twelve) hours., Disp: 60 capsule, Rfl: 3   Evolocumab (REPATHA SURECLICK) 140 MG/ML SOAJ, Inject 140 mg into the skin every 14 (fourteen) days., Disp: 2 mL, Rfl: 5   ezetimibe (ZETIA) 10 MG tablet, Take 1 tablet (10 mg total) by mouth daily., Disp: 30 tablet, Rfl: 3   furosemide (LASIX) 40 MG tablet, Take 1 tablet (40 mg total) by mouth daily as needed. For weight gain of 3 lbs in 24 hours, Disp: 30 tablet, Rfl: 3   loperamide (IMODIUM A-D) 2 MG tablet, Take 1 tablet (2 mg total) by mouth 4 (four) times daily as needed for diarrhea or loose stools., Disp: 30 tablet, Rfl: 0   Magnesium Citrate POWD, Take 1  Scoop by mouth daily as needed (regularity)., Disp: , Rfl:    melatonin 5 MG TABS, Take 5 mg by mouth at bedtime., Disp: , Rfl:    Menaquinone-7 (VITAMIN K2 PO), Take 150 mcg by mouth daily. MK-7, Disp: , Rfl:    Multiple Vitamins-Minerals (IMMUNE SUPPORT PO), Take 520 mg by mouth daily. Vitac-ld Liposomal Vitamin, Disp: , Rfl:    NON FORMULARY, Take 1 capsule by mouth daily. HAIOTB Circulation Syn3rgy (Beet Root, L-Arginine, Horse Chestnut), Disp: , Rfl:    NP THYROID 60 MG tablet, Take 60 mg by mouth every morning., Disp: , Rfl:    OVER THE COUNTER MEDICATION, Take 2 capsules by mouth daily. [place] grape capsules - 650 mg each - take 2 capsules by mouth daily, Disp: , Rfl:    potassium chloride (KLOR-CON M) 10 MEQ tablet, Take 2 tablets (20 mEq total) by mouth daily as needed. Take when you take a Lasix (Furosemide), Disp: 60 tablet, Rfl: 3   Pregnenolone Micronized (PREGNENOLONE PO), Take 75 mg by mouth daily., Disp: , Rfl:    Probiotic Product (PROBIOTIC PO), Take 1 capsule by mouth daily. 20 billion, Disp: , Rfl:    QUERCETIN PO, Take 1 capsule by mouth daily. Bio-Quercetin, Disp: , Rfl:    sildenafil (REVATIO) 20 MG tablet, take 2 to 5  tablets by mouth 1 hour prior to intercourse, Disp: 90 tablet, Rfl: 1   testosterone cypionate (DEPOTESTOSTERONE CYPIONATE) 200 MG/ML injection, Inject 0.5 mLs into the skin once a week. Once a week, Disp: , Rfl:    traMADol (ULTRAM) 50 MG tablet, Take 1 tablet (50 mg total) by mouth every 4 (four) hours as needed for moderate pain., Disp: 30 tablet, Rfl: 0   traZODone (DESYREL) 50 MG tablet, Take 0.5-1 tablets (25-50 mg total) by mouth at bedtime as needed for sleep., Disp: 30 tablet, Rfl: 0   Turmeric 500 MG TABS, Take 500 mg by mouth daily., Disp: , Rfl:    UNABLE TO FIND, Take 1 drop by mouth daily as needed. Med Name: Cortexi for tinnitus, Disp: , Rfl:    Zinc 50 MG TABS, Take 50 mg by mouth daily., Disp: , Rfl:   Past Medical History: Past Medical  History:  Diagnosis Date   Cancer ([place])    prostate   Chest pain    Complication of anesthesia    fighting with waking up   Coronary artery disease    Heart attack ([place])    Hypercholesterolemia    Hyperlipidemia    Hypertension    Ischemic heart disease September 2004   Known--with non-Q-wave myocardial infarction    Obesity    Sleep apnea     Tobacco Use: Social History   Tobacco Use  Smoking Status Never  Smokeless Tobacco Never    Labs: Review Flowsheet  More data exists      Latest Ref Rng & Units 07/30/2021 11/28/2021 12/05/2021 12/06/2021 12/26/2021  Labs for ITP Cardiac and Pulmonary Rehab  Cholestrol 0 - 200 mg/dL 186  179  - - 158   LDL (calc) 0 - 99 mg/dL 114  82  - - 84   HDL-C >39.00 mg/dL 47  33  - - 51.30   Trlycerides 0.0 - 149.0 mg/dL 142  322  - - 111.0   Hemoglobin A1c 4.8 - 5.6 % - 5.7  - - -  PH, Arterial 7.35 - 7.45 - - 7.43  7.322  7.327  7.319  7.206  7.284  7.330  -  PCO2 arterial 32 - 48 mmHg - - 33  38.1  42.1  39.8  59.9  48.2  46.2  -  Bicarbonate 20.0 - 28.0 mmol/L - - 21.9  19.5  21.8  20.3  23.8  22.9  23.8  24.3  -  TCO2 22 - 32 mmol/L - - - '21  23  21  26  22  24  25  23  25  26  24  26  '$ -  Acid-base deficit 0.0 - 2.0 mmol/L - - 1.7  6.0  4.0  5.0  5.0  4.0  3.0  2.0  -  O2 Saturation % - - 98.2  95  97  99  86  98  81  100  -    Capillary Blood Glucose: Lab Results  Component Value Date   GLUCAP 111 (H) 12/09/2021   GLUCAP 113 (H) 12/09/2021   GLUCAP 179 (H) 12/09/2021   GLUCAP 131 (H) 12/09/2021   GLUCAP 126 (H) 12/09/2021     Exercise Target Goals: Exercise Program Goal: Individual exercise prescription set using results from initial 6 min walk test and THRR while considering  patient's activity barriers and safety.   Exercise Prescription Goal: Initial exercise prescription builds to 30-45 minutes a day of aerobic activity, 2-3 days per week.  Home exercise guidelines will be given to patient during program as part of  exercise prescription that the participant will acknowledge.  Activity Barriers & Risk Stratification:  Activity Barriers & Cardiac Risk Stratification - 02/13/22 1512       Activity Barriers & Cardiac Risk Stratification   Activity Barriers Shortness of Breath;Balance Concerns    Cardiac Risk Stratification High   under 5 MET's on            6 Minute Walk:  6 Minute Walk     Row Name 02/13/22 1510 04/18/22 1040       6 Minute Walk   Phase Initial Discharge    Distance 1530 feet 1770 feet    Distance % Change -- 15.69 %    Distance Feet Change -- 240 ft    Walk Time 6 minutes 6 minutes    # of Rest Breaks 0 0    MPH 2.9 3.76    METS 3.37 3.76    RPE 11 12    Perceived Dyspnea  0 0    VO2 Peak 11.78 13.17    Symptoms Yes (comment) No    Comments Mild chest pressure post walk test, resolved with rest, --    Resting HR 105 bpm 81 bpm    Resting BP 124/76 132/60    Resting Oxygen Saturation  97 % --    Exercise Oxygen Saturation  during 6 min walk 97 % --    Max Ex. HR 117 bpm 116 bpm    Max Ex. BP 158/90 162/60    2 Minute Post BP 138/82 102/62             Oxygen Initial Assessment:   Oxygen Re-Evaluation:   Oxygen Discharge (Final Oxygen Re-Evaluation):   Initial Exercise Prescription:  Initial Exercise Prescription - 02/13/22 1500       Date of Initial Exercise RX and Referring Provider   Date 02/13/22    Referring Provider Dr. Julien Nordmann, MD    Expected Discharge Date 04/25/22      Bike   Level 2    Minutes 15    METs 2.3      NuStep   Level 2    SPM 85    Minutes 15    METs 2.3      Prescription Details   Frequency (times per week) 3    Duration Progress to 30 minutes of continuous aerobic without signs/symptoms of physical distress      Intensity   THRR 40-80% of Max Heartrate 60-120    Ratings of Perceived Exertion 11-13    Perceived Dyspnea 0-4      Progression   Progression Continue progressive overload as per  policy without signs/symptoms or physical distress.      Resistance Training   Training Prescription Yes    Weight 4    Reps 10-15             Perform Capillary Blood Glucose checks as needed.  Exercise Prescription Changes:   Exercise Prescription Changes     Row Name 02/26/22 1021 03/19/22 1029 04/09/22 1026 04/18/22 1023       Response to Exercise   Blood Pressure (Admit) 122/68 120/80 138/70 132/60    Blood Pressure (Exercise) 146/78 138/80 148/82 162/60    Blood Pressure (Exit) 124/78 130/74 122/70 102/62    Heart Rate (Admit) 92 bpm 101 bpm 89 bpm 81 bpm    Heart Rate (Exercise) 104 bpm 130 bpm  126 bpm 116 bpm    Heart Rate (Exit) 88 bpm 98 bpm 96 bpm 88 bpm    Rating of Perceived Exertion (Exercise) 13 13 12 12     Symptoms None None None None    Comments Off to a good start with exercise. -- -- --    Duration Continue with 30 min of aerobic exercise without signs/symptoms of physical distress. Continue with 30 min of aerobic exercise without signs/symptoms of physical distress. Continue with 30 min of aerobic exercise without signs/symptoms of physical distress. Continue with 30 min of aerobic exercise without signs/symptoms of physical distress.    Intensity THRR unchanged THRR unchanged THRR unchanged THRR unchanged      Progression   Progression Continue to progress workloads to maintain intensity without signs/symptoms of physical distress. Continue to progress workloads to maintain intensity without signs/symptoms of physical distress. Continue to progress workloads to maintain intensity without signs/symptoms of physical distress. Continue to progress workloads to maintain intensity without signs/symptoms of physical distress.    Average METs 2.1 2.6 3.8 3.9      Resistance Training   Training Prescription No  Relaxation day, no weights No  Relaxation day, no weights No  Relaxation day, no weights Yes    Weight -- -- -- 4 lbs    Reps -- -- -- 10-15    Time --  -- -- 10 Minutes      Interval Training   Interval Training No No No No      Bike   Level 2 2 4 4     Watts 19 33 -- 74    Minutes 15 15 15 15     METs 2.5 3 4.5 4.2      NuStep   Level 2 3 4 4     SPM 67 93 90 90    Minutes 15 15 15 15     METs 1.8 2.3 3.1 4      Track   Laps -- -- -- 15  1770 ft    Minutes -- -- -- 6  6-minute walk test    METs -- -- -- 3.57      Home Exercise Plan   Plans to continue exercise at -- Home (comment)  Walking Home (comment)  Walking Home (comment)  Walking    Frequency -- Add 2 additional days to program exercise sessions. Add 2 additional days to program exercise sessions. Add 2 additional days to program exercise sessions.    Initial Home Exercises Provided -- 03/19/22 03/19/22 03/19/22             Exercise Comments:   Exercise Comments     Row Name 02/26/22 1123 03/19/22 1117 04/09/22 1101 04/21/22 1022     Exercise Comments Patient tolerated first session of exercise well without symptoms. Reviewed home exercise guidelines, METs, and goals with patient. Reviewed METs with patient. Reviewed goals with patient.             Exercise Goals and Review:   Exercise Goals     Row Name 02/13/22 1521             Exercise Goals   Increase Physical Activity Yes       Intervention Provide advice, education, support and counseling about physical activity/exercise needs.;Develop an individualized exercise prescription for aerobic and resistive training based on initial evaluation findings, risk stratification, comorbidities and participant's personal goals.       Expected Outcomes Short Term: Attend rehab on a regular basis to increase  amount of physical activity.;Long Term: Exercising regularly at least 3-5 days a week.;Long Term: Add in home exercise to make exercise part of routine and to increase amount of physical activity.       Increase Strength and Stamina Yes       Intervention Provide advice, education, support and counseling  about physical activity/exercise needs.;Develop an individualized exercise prescription for aerobic and resistive training based on initial evaluation findings, risk stratification, comorbidities and participant's personal goals.       Expected Outcomes Short Term: Increase workloads from initial exercise prescription for resistance, speed, and METs.;Short Term: Perform resistance training exercises routinely during rehab and add in resistance training at home;Long Term: Improve cardiorespiratory fitness, muscular endurance and strength as measured by increased METs and functional capacity ( )       Able to understand and use rate of perceived exertion (RPE) scale Yes       Intervention Provide education and explanation on how to use RPE scale       Expected Outcomes Short Term: Able to use RPE daily in rehab to express subjective intensity level;Long Term:  Able to use RPE to guide intensity level when exercising independently       Knowledge and understanding of Target Heart Rate Range (THRR) Yes       Intervention Provide education and explanation of THRR including how the numbers were predicted and where they are located for reference       Expected Outcomes Short Term: Able to state/look up THRR;Long Term: Able to use THRR to govern intensity when exercising independently;Short Term: Able to use daily as guideline for intensity in rehab       Understanding of Exercise Prescription Yes       Intervention Provide education, explanation, and written materials on patient's individual exercise prescription       Expected Outcomes Short Term: Able to explain program exercise prescription;Long Term: Able to explain home exercise prescription to exercise independently                Exercise Goals Re-Evaluation :  Exercise Goals Re-Evaluation     Row Name 02/26/22 1123 03/19/22 1117 04/18/22 1050 04/21/22 1022       Exercise Goal Re-Evaluation   Exercise Goals Review Increase Physical  Activity;Able to understand and use rate of perceived exertion (RPE) scale Increase Physical Activity;Able to understand and use rate of perceived exertion (RPE) scale;Increase Strength and Stamina;Knowledge and understanding of Target Heart Rate Range (THRR);Able to check pulse independently;Understanding of Exercise Prescription Increase Physical Activity;Able to understand and use rate of perceived exertion (RPE) scale;Increase Strength and Stamina;Knowledge and understanding of Target Heart Rate Range (THRR);Able to check pulse independently;Understanding of Exercise Prescription Increase Physical Activity;Able to understand and use rate of perceived exertion (RPE) scale;Increase Strength and Stamina;Knowledge and understanding of Target Heart Rate Range (THRR);Able to check pulse independently;Understanding of Exercise Prescription    Comments Patient able to understand and use RPE scale appropriately. Reviewed exercise prescription with patient. Patient is walking and has 5-6 lb hand weights at home that he can use for his resistance training. Discussed increasing hand weights from 4 to 5 lbs, and patient is amenable to this. Patient's functional capacity increased 16% as measured by 6-minute walk test. Patient is progressing well and is scheduled to complete cardiac rehab next Friday. Patient's grip strength increased 19% as measured by grip strength test. Patient plans to continue walking 30 minutes at least 3 days/ week. Patient has 6 lb hand  weights that he plans to use for his resistance training. Increased hand weights at cardiac rehab today from 4 to 5 lbs.    Expected Outcomes Progress workloads as tolerated to help increase cardiorespiratory fitness. Patient will walk at home at least 30 minutes 2-3 days/week in addition to exercise at cardiac rehab to help achieve 150 minutes of aerobic exercise/week. Increase hand weights from 4 to 5 lbs at CR. Continue to progress workloads as tolerated. Patient  will continue home exercise routine upon completion of the cardiac rehab program.             Discharge Exercise Prescription (Final Exercise Prescription Changes):  Exercise Prescription Changes - 04/18/22 1023       Response to Exercise   Blood Pressure (Admit) 132/60    Blood Pressure (Exercise) 162/60    Blood Pressure (Exit) 102/62    Heart Rate (Admit) 81 bpm    Heart Rate (Exercise) 116 bpm    Heart Rate (Exit) 88 bpm    Rating of Perceived Exertion (Exercise) 12    Symptoms None    Duration Continue with 30 min of aerobic exercise without signs/symptoms of physical distress.    Intensity THRR unchanged      Progression   Progression Continue to progress workloads to maintain intensity without signs/symptoms of physical distress.    Average METs 3.9      Resistance Training   Training Prescription Yes    Weight 4 lbs    Reps 10-15    Time 10 Minutes      Interval Training   Interval Training No      Bike   Level 4    Watts 74    Minutes 15    METs 4.2      NuStep   Level 4    SPM 90    Minutes 15    METs 4      Track   Laps 15   1770 ft   Minutes 6   6-minute walk test   METs 3.57      Home Exercise Plan   Plans to continue exercise at Home (comment)   Walking   Frequency Add 2 additional days to program exercise sessions.    Initial Home Exercises Provided 03/19/22             Nutrition:  Target Goals: Understanding of nutrition guidelines, daily intake of sodium 1500mg , cholesterol 200mg , calories 30% from fat and 7% or less from saturated fats, daily to have 5 or more servings of fruits and vegetables.  Biometrics:  Pre Biometrics - 02/13/22 1521       Pre Biometrics   Waist Circumference 43 inches    Hip Circumference 44 inches    Waist to Hip Ratio 0.98 %    Triceps Skinfold 14 mm    % Body Fat 30.9 %    Grip Strength 42 kg    Flexibility --   pt unable to reach   Single Leg Stand 6.7 seconds             Post  Biometrics - 04/21/22 1022        Post  Biometrics   Waist Circumference 42 inches    Hip Circumference 43.25 inches    Waist to Hip Ratio 0.97 %    Triceps Skinfold 13 mm    Grip Strength 50 kg    Flexibility 9 in    Single Leg Stand 24 seconds  Nutrition Therapy Plan and Nutrition Goals:  Nutrition Therapy & Goals - 03/24/22 1141       Nutrition Therapy   Diet Heart Healthy Diet      Personal Nutrition Goals   Nutrition Goal Patient to identify strategies for reducing cardiovascular risk by attending the weekly [employer] education and nutrition series    Personal Goal #2 Patient to improve diet quality by using the plate method as a daily guide for meal planning to include lean protein/plant protein, fruits, vegetables, whole grains, nonfat dairy as part of well balanced diet    Comments Goals in progress. Lipids improved but LDL is not at goal (<55); Repatha/PCSK9 was started following cardiology follow-up on 02/25/22. Phylliss Bob does continue to attend the [employer] education and nutrition series. Phylliss Bob will benefit from participation in intensive cardiac rehab for nutrition, exercise, and lifestyle modification.      Intervention Plan   Intervention Prescribe, educate and counsel regarding individualized specific dietary modifications aiming towards targeted core components such as weight, hypertension, lipid management, diabetes, heart failure and other comorbidities.;Nutrition handout(s) given to patient.    Expected Outcomes Short Term Goal: Understand basic principles of dietary content, such as calories, fat, sodium, cholesterol and nutrients.;Long Term Goal: Adherence to prescribed nutrition plan.             Nutrition Assessments:  MEDIFICTS Score Key: ?70 Need to make dietary changes  40-70 Heart Healthy Diet ? 40 Therapeutic Level Cholesterol Diet    Picture Your Plate Scores: <16 Unhealthy dietary pattern with much room for improvement. 41-50 Dietary  pattern unlikely to meet recommendations for good health and room for improvement. 51-60 More healthful dietary pattern, with some room for improvement.  >60 Healthy dietary pattern, although there may be some specific behaviors that could be improved.    Nutrition Goals Re-Evaluation:  Nutrition Goals Re-Evaluation     Row Name 02/26/22 1603 03/24/22 1141           Goals   Current Weight 220 lb 3.8 oz (99.9 kg) 220 lb 10.9 oz (100.1 kg)      Comment lipids improved- LDL 84, A1c 5.7 No new labs; most recent labs  lipids improved- LDL 84, A1c 5.7      Expected Outcome Lipids improved but LDL is not at goal (<55). It was recommended to add PCSK9 inhibitor at cardiology follow-up on 02/25/22; he is not currently on a statin per documentation. Phylliss Bob will benefit from participation in intensive cardiac rehab for nutrition, exercise, and lifestyle modification. Goals in progress. Lipids improved but LDL is not at goal (<55); Repatha/PCSK9 was started following cardiology follow-up on 02/25/22. Phylliss Bob does continue to attend the [employer] education and nutrition series. Phylliss Bob will benefit from participation in intensive cardiac rehab for nutrition, exercise, and lifestyle modification.               Nutrition Goals Re-Evaluation:  Nutrition Goals Re-Evaluation     Row Name 02/26/22 1603 03/24/22 1141           Goals   Current Weight 220 lb 3.8 oz (99.9 kg) 220 lb 10.9 oz (100.1 kg)      Comment lipids improved- LDL 84, A1c 5.7 No new labs; most recent labs  lipids improved- LDL 84, A1c 5.7      Expected Outcome Lipids improved but LDL is not at goal (<55). It was recommended to add PCSK9 inhibitor at cardiology follow-up on 02/25/22; he is not currently on a statin per documentation. Rowe will  benefit from participation in intensive cardiac rehab for nutrition, exercise, and lifestyle modification. Goals in progress. Lipids improved but LDL is not at goal (<55); Repatha/PCSK9 was started  following cardiology follow-up on 02/25/22. Phylliss Bob does continue to attend the [employer] education and nutrition series. Phylliss Bob will benefit from participation in intensive cardiac rehab for nutrition, exercise, and lifestyle modification.               Nutrition Goals Discharge (Final Nutrition Goals Re-Evaluation):  Nutrition Goals Re-Evaluation - 03/24/22 1141       Goals   Current Weight 220 lb 10.9 oz (100.1 kg)    Comment No new labs; most recent labs  lipids improved- LDL 84, A1c 5.7    Expected Outcome Goals in progress. Lipids improved but LDL is not at goal (<55); Repatha/PCSK9 was started following cardiology follow-up on 02/25/22. Phylliss Bob does continue to attend the [employer] education and nutrition series. Phylliss Bob will benefit from participation in intensive cardiac rehab for nutrition, exercise, and lifestyle modification.             Psychosocial: Target Goals: Acknowledge presence or absence of significant depression and/or stress, maximize coping skills, provide positive support system. Participant is able to verbalize types and ability to use techniques and skills needed for reducing stress and depression.  Initial Review & Psychosocial Screening:  Initial Psych Review & Screening - 02/13/22 1448       Initial Review   Current issues with None Identified      Family Dynamics   Good Support System? Yes      Barriers   Psychosocial barriers to participate in program There are no identifiable barriers or psychosocial needs.      Screening Interventions   Interventions Encouraged to exercise             Quality of Life Scores:  Quality of Life - 04/21/22 1507       Quality of Life   Select Quality of Life      Quality of Life Scores   Health/Function Pre 18.1 %    Health/Function Post 20.63 %    Health/Function % Change 13.98 %    Socioeconomic Pre 22.14 %    Socioeconomic Post 21.5 %    Socioeconomic % Change  -2.89 %    Psych/Spiritual Pre 20.57 %     Psych/Spiritual Post 21 %    Psych/Spiritual % Change 2.09 %    Family Pre 28.8 %    Family Post 27.1 %    Family % Change -5.9 %    GLOBAL Pre 21.01 %    GLOBAL Post 21.84 %    GLOBAL % Change 3.95 %            Scores of 19 and below usually indicate a poorer quality of life in these areas.  A difference of  2-3 points is a clinically meaningful difference.  A difference of 2-3 points in the total score of the Quality of Life Index has been associated with significant improvement in overall quality of life, self-image, physical symptoms, and general health in studies assessing change in quality of life.  PHQ-9: Review Flowsheet  More data may exist      03/27/2022 02/13/2022 12/26/2021 01/30/2017 10/31/2016  Depression screen PHQ 2/9  Decreased Interest 0 0 0 0 0  Down, Depressed, Hopeless 0 0 0 0 0  PHQ - 2 Score 0 0 0 0 0  Altered sleeping 0 0 - - -  Tired, decreased  energy 0 1 - - -  Change in appetite 0 0 - - -  Feeling bad or failure about yourself  0 0 - - -  Trouble concentrating 0 0 - - -  Moving slowly or fidgety/restless 0 0 - - -  Suicidal thoughts 0 0 - - -  PHQ-9 Score 0 1 - - -  Difficult doing work/chores Not difficult at all Not difficult at all - - -   Interpretation of Total Score  Total Score Depression Severity:  1-4 = Minimal depression, 5-9 = Mild depression, 10-14 = Moderate depression, 15-19 = Moderately severe depression, 20-27 = Severe depression   Psychosocial Evaluation and Intervention:   Psychosocial Re-Evaluation:  Psychosocial Re-Evaluation     Row Name 02/26/22 1448 03/25/22 1623 04/17/22 1258         Psychosocial Re-Evaluation   Current issues with None Identified None Identified None Identified     Comments -- Quality of life questionnaire reviewed. Rowe reports feeling stronger and less short of breath --     Interventions Stress management education;Relaxation education;Encouraged to attend Cardiac Rehabilitation for the exercise  Stress management education;Relaxation education;Encouraged to attend Cardiac Rehabilitation for the exercise Encouraged to attend Cardiac Rehabilitation for the exercise     Continue Psychosocial Services  No Follow up required No Follow up required No Follow up required              Psychosocial Discharge (Final Psychosocial Re-Evaluation):  Psychosocial Re-Evaluation - 04/17/22 1258       Psychosocial Re-Evaluation   Current issues with None Identified    Interventions Encouraged to attend Cardiac Rehabilitation for the exercise    Continue Psychosocial Services  No Follow up required             Vocational Rehabilitation: Provide vocational rehab assistance to qualifying candidates.   Vocational Rehab Evaluation & Intervention:  Vocational Rehab - 02/13/22 1459       Initial Vocational Rehab Evaluation & Intervention   Assessment shows need for Vocational Rehabilitation No   Phylliss Bob is retired and does not need vocational rehab at this time.            Education: Education Goals: Education classes will be provided on a weekly basis, covering required topics. Participant will state understanding/return demonstration of topics presented.    Education     Row Name 02/26/22 1215     Education   Cardiac Education Topics Pritikin   Orthoptist   Educator Dietitian   Weekly Topic Fast Evening Meals   Instruction Review Code 1- Verbalizes Understanding   Class Start Time 1140   Class Stop Time 1215   Class Time Calculation (min) 35 min    Row Name 03/10/22 1600     Education   Cardiac Education Topics Pritikin   [employer] Workshops     Workshops   Educator Exercise Physiologist   Select Psychosocial   Psychosocial Workshop Other  From [employer] to Heart   Instruction Review Code 1- Verbalizes Understanding   Class Start Time 1145   Class Stop Time 1231   Class Time Calculation (min) 46 min    Row Name 03/12/22 1600      Education   Cardiac Education Topics Pritikin   Armed forces operational officer   Weekly Topic Adding Flavor - Sodium-Free   Instruction Review Code 1- Verbalizes Understanding   Class Start Time  1150   Class Stop Time 1223   Class Time Calculation (min) 33 min    Row Name 03/14/22 1200     Education   Cardiac Education Topics Pritikin   Hospital doctor Education   General Education Heart Disease Risk Reduction   Instruction Review Code 1- Verbalizes Understanding   Class Start Time 1147   Class Stop Time 1222   Class Time Calculation (min) 35 min    Row Name 03/17/22 1400     Education   Cardiac Education Topics Pritikin   Geographical information systems officer Exercise   Exercise Workshop Location manager and Fall Prevention   Instruction Review Code 1- Verbalizes Understanding   Class Start Time 1145   Class Stop Time 1230   Class Time Calculation (min) 45 min    Row Name 03/19/22 1300     Education   Cardiac Education Topics Pritikin   Customer service manager   Weekly Topic Fast and Healthy Breakfasts   Instruction Review Code 1- Verbalizes Understanding   Class Start Time 1145   Class Stop Time 1226   Class Time Calculation (min) 41 min    Row Name 03/21/22 1200     Education   Cardiac Education Topics Pritikin   Licensed conveyancer Nutrition   Nutrition Fueling a Healthy Body   Instruction Review Code 1- Verbalizes Understanding   Class Start Time 1150   Class Stop Time 1230   Class Time Calculation (min) 40 min    Row Name 03/24/22 1700     Education   Cardiac Education Topics Pritikin   [employer]     Workshops   Educator Exercise Physiologist   [employer] Psychosocial   Psychosocial Workshop Recognizing  and Reducing Stress   Instruction Review Code 1- Verbalizes Understanding   Class Start Time 1148   Class Stop Time 1233   Class Time Calculation (min) 45 min    Row Name 03/26/22 1200     Education   Cardiac Education Topics Pritikin   Customer service manager   Weekly Topic Personalizing Your Pritikin Plate   Instruction Review Code 1- Verbalizes Understanding   Class Start Time 1140   Class Stop Time 1220   Class Time Calculation (min) 40 min    Row Name 03/28/22 1300     Education   Cardiac Education Topics Pritikin   Nurse, children's Exercise Physiologist   Select Psychosocial   Psychosocial Healthy Minds, Bodies, Hearts   Instruction Review Code 1- Verbalizes Understanding   Class Start Time 1151   Class Stop Time 1230   Class Time Calculation (min) 39 min    Row Name 03/31/22 1300     Education   Cardiac Education Topics Pritikin   Glass blower/designer Nutrition   Nutrition Workshop Label Reading   Instruction Review Code 1- Tax inspector   Class Start Time 1150   Class Stop Time 1233   Class Time Calculation (min) 43 min    Row Name 04/02/22 1230     Education  Cardiac Education Topics Pritikin   Customer service manager   Weekly Topic Tasty Appetizers and Snacks   Instruction Review Code 1- Verbalizes Understanding   Class Start Time 1150   Class Stop Time 1226   Class Time Calculation (min) 36 min    Row Name 04/04/22 1200     Education   Cardiac Education Topics Pritikin   Select Core Videos     Core Videos   Educator Dietitian   Nutrition Other   Instruction Review Code 1- Verbalizes Understanding   Class Start Time 1150   Class Stop Time 1226   Class Time Calculation (min) 36 min    Row Name 04/07/22 1300     Education   Cardiac Education Topics Pritikin   Customer service manager Exercise   Exercise Workshop Exercise Basics: Diplomatic Services operational officer   Instruction Review Code 1- Verbalizes Understanding   Class Start Time 1150   Class Stop Time 1235   Class Time Calculation (min) 45 min    Row Name 04/09/22 1500     Education   Cardiac Education Topics Pritikin   Orthoptist   Educator Dietitian   Weekly Topic Tasty Appetizers and Snacks   Instruction Review Code 1- Verbalizes Understanding   Class Start Time 1145   Class Stop Time 1223   Class Time Calculation (min) 38 min    Row Name 04/11/22 1300     Education   Cardiac Education Topics Pritikin   Licensed conveyancer Nutrition   Nutrition Calorie Density   Instruction Review Code 1- Verbalizes Understanding   Class Start Time 1145   Class Stop Time 1225   Class Time Calculation (min) 40 min    Row Name 04/16/22 1300     Education   Cardiac Education Topics Pritikin   Customer service manager   Weekly Topic Efficiency Cooking - Meals in a Snap   Instruction Review Code 1- Verbalizes Understanding   Class Start Time 1140   Class Stop Time 1220   Class Time Calculation (min) 40 min    Row Name 04/18/22 1400     Education   Cardiac Education Topics Pritikin   Psychologist, forensic Exercise Education   Exercise Education Move It!   Instruction Review Code 1- Verbalizes Understanding   Class Start Time 1150   Class Stop Time 1230   Class Time Calculation (min) 40 min            Core Videos: Exercise    Move It!  Clinical staff conducted group or individual video education with verbal and written material and guidebook.  Patient learns the recommended Pritikin exercise program. Exercise with the goal of living a long, healthy life. Some of  the health benefits of exercise include controlled diabetes, healthier blood pressure levels, improved cholesterol levels, improved heart and lung capacity, improved sleep, and better body composition. Everyone should speak with their doctor before starting or changing an exercise routine.  Biomechanical Limitations Clinical staff conducted group or individual video education with verbal and written material and guidebook.  Patient learns how biomechanical limitations can impact  exercise and how we can mitigate and possibly overcome limitations to have an impactful and balanced exercise routine.  Body Composition Clinical staff conducted group or individual video education with verbal and written material and guidebook.  Patient learns that body composition (ratio of muscle mass to fat mass) is a key component to assessing overall fitness, rather than body weight alone. Increased fat mass, especially visceral belly fat, can put [place] at increased risk for metabolic syndrome, type 2 diabetes, heart disease, and even death. It is recommended to combine diet and exercise (cardiovascular and resistance training) to improve your body composition. Seek guidance from your physician and exercise physiologist before implementing an exercise routine.  Exercise Action Plan Clinical staff conducted group or individual video education with verbal and written material and guidebook.  Patient learns the recommended strategies to achieve and enjoy long-term exercise adherence, including variety, self-motivation, self-efficacy, and positive decision making. Benefits of exercise include fitness, good health, weight management, more energy, better sleep, less stress, and overall well-being.  Medical   Heart Disease Risk Reduction Clinical staff conducted group or individual video education with verbal and written material and guidebook.  Patient learns our heart is our most vital organ as it circulates oxygen, nutrients,  white blood cells, and hormones throughout the entire body, and carries waste away. Data supports a plant-based eating plan like the Pritikin Program for its effectiveness in slowing progression of and reversing heart disease. The video provides a number of recommendations to address heart disease.   Metabolic Syndrome and Belly Fat  Clinical staff conducted group or individual video education with verbal and written material and guidebook.  Patient learns what metabolic syndrome is, how it leads to heart disease, and how one can reverse it and keep it from coming back. You have metabolic syndrome if you have 3 of the following 5 criteria: abdominal obesity, high blood pressure, high triglycerides, low HDL cholesterol, and high blood sugar.  Hypertension and Heart Disease Clinical staff conducted group or individual video education with verbal and written material and guidebook.  Patient learns that high blood pressure, or hypertension, is very common in the [place]. Hypertension is largely due to excessive salt intake, but other important risk factors include being overweight, physical inactivity, drinking too much alcohol, smoking, and not eating enough potassium from fruits and vegetables. High blood pressure is a leading risk factor for heart attack, stroke, congestive heart failure, dementia, kidney failure, and premature death. Long-term effects of excessive salt intake include stiffening of the arteries and thickening of heart muscle and organ damage. Recommendations include ways to reduce hypertension and the risk of heart disease.  Diseases of Our Time - Focusing on Diabetes Clinical staff conducted group or individual video education with verbal and written material and guidebook.  Patient learns why the best way to stop diseases of our time is prevention, through food and other lifestyle changes. Medicine (such as prescription pills and surgeries) is often only a Band-Aid on the problem,  not a long-term solution. Most common diseases of our time include obesity, type 2 diabetes, hypertension, heart disease, and cancer. The Pritikin Program is recommended and has been proven to help reduce, reverse, and/or prevent the damaging effects of metabolic syndrome.  Nutrition   Overview of the Pritikin Eating Plan  Clinical staff conducted group or individual video education with verbal and written material and guidebook.  Patient learns about the Pritikin Eating Plan for disease risk reduction. The Pritikin Eating Plan emphasizes a wide  variety of unrefined, minimally-processed carbohydrates, like fruits, vegetables, whole grains, and legumes. Go, Caution, and Stop food choices are explained. Plant-based and lean animal proteins are emphasized. Rationale provided for low sodium intake for blood pressure control, low added sugars for blood sugar stabilization, and low added fats and oils for coronary artery disease risk reduction and weight management.  Calorie Density  Clinical staff conducted group or individual video education with verbal and written material and guidebook.  Patient learns about calorie density and how it impacts the Pritikin Eating Plan. Knowing the characteristics of the food you choose will help you decide whether those foods will lead to weight gain or weight loss, and whether you want to consume more or less of them. Weight loss is usually a side effect of the Pritikin Eating Plan because of its focus on low calorie-dense foods.  Label Reading  Clinical staff conducted group or individual video education with verbal and written material and guidebook.  Patient learns about the Pritikin recommended label reading guidelines and corresponding recommendations regarding calorie density, added sugars, sodium content, and whole grains.  Dining Out - Part 1  Clinical staff conducted group or individual video education with verbal and written material and guidebook.  Patient  learns that restaurant meals can be sabotaging because they can be so high in calories, fat, sodium, and/or sugar. Patient learns recommended strategies on how to positively address this and avoid unhealthy pitfalls.  Facts on Fats  Clinical staff conducted group or individual video education with verbal and written material and guidebook.  Patient learns that lifestyle modifications can be just as effective, if not more so, as many medications for lowering your risk of heart disease. A Pritikin lifestyle can help to reduce your risk of inflammation and atherosclerosis (cholesterol build-up, or plaque, in the artery walls). Lifestyle interventions such as dietary choices and physical activity address the cause of atherosclerosis. A review of the types of fats and their impact on blood cholesterol levels, along with dietary recommendations to reduce fat intake is also included.  Nutrition Action Plan  Clinical staff conducted group or individual video education with verbal and written material and guidebook.  Patient learns how to incorporate Pritikin recommendations into their lifestyle. Recommendations include planning and keeping personal health goals in mind as an important part of their success.  Healthy Mind-Set    Healthy Minds, Bodies, Hearts  Clinical staff conducted group or individual video education with verbal and written material and guidebook.  Patient learns how to identify when they are stressed. Video will discuss the impact of that stress, as well as the many benefits of stress management. Patient will also be introduced to stress management techniques. The way we think, act, and feel has an impact on our hearts.  How Our Thoughts Can Heal Our Hearts  Clinical staff conducted group or individual video education with verbal and written material and guidebook.  Patient learns that negative thoughts can cause depression and anxiety. This can result in negative lifestyle behavior and  serious health problems. Cognitive behavioral therapy is an effective method to help control our thoughts in order to change and improve our emotional outlook.  Additional Videos:  Exercise    Improving Performance  Clinical staff conducted group or individual video education with verbal and written material and guidebook.  Patient learns to use a non-linear approach by alternating intensity levels and lengths of time spent exercising to help burn more calories and lose more body fat. Cardiovascular exercise helps improve  heart health, metabolism, hormonal balance, blood sugar control, and recovery from fatigue. Resistance training improves strength, endurance, balance, coordination, reaction time, metabolism, and muscle mass. Flexibility exercise improves circulation, posture, and balance. Seek guidance from your physician and exercise physiologist before implementing an exercise routine and learn your capabilities and proper form for all exercise.  Introduction to Yoga  Clinical staff conducted group or individual video education with verbal and written material and guidebook.  Patient learns about yoga, a discipline of the coming together of mind, breath, and body. The benefits of yoga include improved flexibility, improved range of motion, better posture and core strength, increased lung function, weight loss, and positive self-image. Yoga's heart health benefits include lowered blood pressure, healthier heart rate, decreased cholesterol and triglyceride levels, improved immune function, and reduced stress. Seek guidance from your physician and exercise physiologist before implementing an exercise routine and learn your capabilities and proper form for all exercise.  Medical   Aging: Enhancing Your Quality of Life  Clinical staff conducted group or individual video education with verbal and written material and guidebook.  Patient learns key strategies and recommendations to stay in good physical  health and enhance quality of life, such as prevention strategies, having an advocate, securing a Health Care Proxy and Power of Attorney, and keeping a list of medications and system for tracking them. It also discusses how to avoid risk for bone loss.  Biology of Weight Control  Clinical staff conducted group or individual video education with verbal and written material and guidebook.  Patient learns that weight gain occurs because we consume more calories than we burn (eating more, moving less). Even if your body weight is normal, you may have higher ratios of fat compared to muscle mass. Too much body fat puts you at increased risk for cardiovascular disease, heart attack, stroke, type 2 diabetes, and obesity-related cancers. In addition to exercise, following the Pritikin Eating Plan can help reduce your risk.  Decoding Lab Results  Clinical staff conducted group or individual video education with verbal and written material and guidebook.  Patient learns that lab test reflects one measurement whose values change over time and are influenced by many factors, including medication, stress, sleep, exercise, food, hydration, pre-existing medical conditions, and more. It is recommended to use the knowledge from this video to become more involved with your lab results and evaluate your numbers to speak with your doctor.   Diseases of Our Time - Overview  Clinical staff conducted group or individual video education with verbal and written material and guidebook.  Patient learns that according to the CDC, 50% to 70% of chronic diseases (such as obesity, type 2 diabetes, elevated lipids, hypertension, and heart disease) are avoidable through lifestyle improvements including healthier food choices, listening to satiety cues, and increased physical activity.  Sleep Disorders Clinical staff conducted group or individual video education with verbal and written material and guidebook.  Patient learns how  good quality and duration of sleep are important to overall health and well-being. Patient also learns about sleep disorders and how they impact health along with recommendations to address them, including discussing with a physician.  Nutrition  Dining Out - Part 2 Clinical staff conducted group or individual video education with verbal and written material and guidebook.  Patient learns how to plan ahead and communicate in order to maximize their dining experience in a healthy and nutritious manner. Included are recommended food choices based on the type of restaurant the patient is visiting.  Fueling a Banker conducted group or individual video education with verbal and written material and guidebook.  There is a strong connection between our food choices and our health. Diseases like obesity and type 2 diabetes are very prevalent and are in large-part due to lifestyle choices. The Pritikin Eating Plan provides plenty of food and hunger-curbing satisfaction. It is easy to follow, affordable, and helps reduce health risks.  Menu Workshop  Clinical staff conducted group or individual video education with verbal and written material and guidebook.  Patient learns that restaurant meals can sabotage health goals because they are often packed with calories, fat, sodium, and sugar. Recommendations include strategies to plan ahead and to communicate with the manager, chef, or server to help order a healthier meal.  Planning Your Eating Strategy  Clinical staff conducted group or individual video education with verbal and written material and guidebook.  Patient learns about the Pritikin Eating Plan and its benefit of reducing the risk of disease. The Pritikin Eating Plan does not focus on calories. Instead, it emphasizes high-quality, nutrient-rich foods. By knowing the characteristics of the foods, we choose, we can determine their calorie density and make informed  decisions.  Targeting Your Nutrition Priorities  Clinical staff conducted group or individual video education with verbal and written material and guidebook.  Patient learns that lifestyle habits have a tremendous impact on disease risk and progression. This video provides eating and physical activity recommendations based on your personal health goals, such as reducing LDL cholesterol, losing weight, preventing or controlling type 2 diabetes, and reducing high blood pressure.  Vitamins and Minerals  Clinical staff conducted group or individual video education with verbal and written material and guidebook.  Patient learns different ways to obtain key vitamins and minerals, including through a recommended healthy diet. It is important to discuss all supplements you take with your doctor.   Healthy Mind-Set    Smoking Cessation  Clinical staff conducted group or individual video education with verbal and written material and guidebook.  Patient learns that cigarette smoking and tobacco addiction pose a serious health risk which affects millions of people. Stopping smoking will significantly reduce the risk of heart disease, lung disease, and many forms of cancer. Recommended strategies for quitting are covered, including working with your doctor to develop a successful plan.  Culinary   Becoming a Set designer conducted group or individual video education with verbal and written material and guidebook.  Patient learns that cooking at home can be healthy, cost-effective, quick, and puts them in control. Keys to cooking healthy recipes will include looking at your recipe, assessing your equipment needs, planning ahead, making it simple, choosing cost-effective seasonal ingredients, and limiting the use of added fats, salts, and sugars.  Cooking - Breakfast and Snacks  Clinical staff conducted group or individual video education with verbal and written material and guidebook.   Patient learns how important breakfast is to satiety and nutrition through the entire day. Recommendations include key foods to eat during breakfast to help stabilize blood sugar levels and to prevent overeating at meals later in the day. Planning ahead is also a key component.  Cooking - Educational psychologist conducted group or individual video education with verbal and written material and guidebook.  Patient learns eating strategies to improve overall health, including an approach to cook more at home. Recommendations include thinking of animal protein as a side on your plate rather than center stage  and focusing instead on lower calorie dense options like vegetables, fruits, whole grains, and plant-based proteins, such as beans. Making sauces in large quantities to freeze for later and leaving the skin on your vegetables are also recommended to maximize your experience.  Cooking - Healthy Salads and Dressing Clinical staff conducted group or individual video education with verbal and written material and guidebook.  Patient learns that vegetables, fruits, whole grains, and legumes are the foundations of the Pritikin Eating Plan. Recommendations include how to incorporate each of these in flavorful and healthy salads, and how to create homemade salad dressings. Proper handling of ingredients is also covered. Cooking - Soups and [employer] - Soups and Desserts Clinical staff conducted group or individual video education with verbal and written material and guidebook.  Patient learns that Pritikin soups and desserts make for easy, nutritious, and delicious snacks and meal components that are low in sodium, fat, sugar, and calorie density, while high in vitamins, minerals, and filling fiber. Recommendations include simple and healthy ideas for soups and desserts.   Overview     The Pritikin Solution Program Overview Clinical staff conducted group or individual video education with  verbal and written material and guidebook.  Patient learns that the results of the Pritikin Program have been documented in more than 100 articles published in peer-reviewed journals, and the benefits include reducing risk factors for (and, in some cases, even reversing) high cholesterol, high blood pressure, type 2 diabetes, obesity, and more! An overview of the three key pillars of the Pritikin Program will be covered: eating well, doing regular exercise, and having a healthy mind-set.  WORKSHOPS  Exercise: Exercise Basics: Building Your Action Plan Clinical staff led group instruction and group discussion with PowerPoint presentation and patient guidebook. To enhance the learning environment the use of posters, models and videos may be added. At the conclusion of this workshop, patients will comprehend the difference between physical activity and exercise, as well as the benefits of incorporating both, into their routine. Patients will understand the FITT (Frequency, Intensity, Time, and Type) principle and how to use it to build an exercise action plan. In addition, safety concerns and other considerations for exercise and cardiac rehab will be addressed by the presenter. The purpose of this lesson is to promote a comprehensive and effective weekly exercise routine in order to improve patients' overall level of fitness.   Managing Heart Disease: Your Path to a Healthier Heart Clinical staff led group instruction and group discussion with PowerPoint presentation and patient guidebook. To enhance the learning environment the use of posters, models and videos may be added.At the conclusion of this workshop, patients will understand the anatomy and physiology of the heart. Additionally, they will understand how Pritikin's three pillars impact the risk factors, the progression, and the management of heart disease.  The purpose of this lesson is to provide a high-level overview of the heart, heart  disease, and how the Pritikin lifestyle positively impacts risk factors.  Exercise Biomechanics Clinical staff led group instruction and group discussion with PowerPoint presentation and patient guidebook. To enhance the learning environment the use of posters, models and videos may be added. Patients will learn how the structural parts of their bodies function and how these functions impact their daily activities, movement, and exercise. Patients will learn how to promote a neutral spine, learn how to manage pain, and identify ways to improve their physical movement in order to promote healthy living. The purpose of this lesson is  to expose patients to common physical limitations that impact physical activity. Participants will learn practical ways to adapt and manage aches and pains, and to minimize their effect on regular exercise. Patients will learn how to maintain good posture while sitting, walking, and lifting.  Balance Training and Fall Prevention  Clinical staff led group instruction and group discussion with PowerPoint presentation and patient guidebook. To enhance the learning environment the use of posters, models and videos may be added. At the conclusion of this workshop, patients will understand the importance of their sensorimotor skills (vision, proprioception, and the vestibular system) in maintaining their ability to balance as they age. Patients will apply a variety of balancing exercises that are appropriate for their current level of function. Patients will understand the common causes for poor balance, possible solutions to these problems, and ways to modify their physical environment in order to minimize their fall risk. The purpose of this lesson is to teach patients about the importance of maintaining balance as they age and ways to minimize their risk of falling.  WORKSHOPS   Nutrition:  Fueling a Ship broker led group instruction and group  discussion with PowerPoint presentation and patient guidebook. To enhance the learning environment the use of posters, models and videos may be added. Patients will review the foundational principles of the Pritikin Eating Plan and understand what constitutes a serving size in each of the food groups. Patients will also learn Pritikin-friendly foods that are better choices when away from home and review make-ahead meal and snack options. Calorie density will be reviewed and applied to three nutrition priorities: weight maintenance, weight loss, and weight gain. The purpose of this lesson is to reinforce (in a group setting) the key concepts around what patients are recommended to eat and how to apply these guidelines when away from home by planning and selecting Pritikin-friendly options. Patients will understand how calorie density may be adjusted for different weight management goals.  Mindful Eating  Clinical staff led group instruction and group discussion with PowerPoint presentation and patient guidebook. To enhance the learning environment the use of posters, models and videos may be added. Patients will briefly review the concepts of the Pritikin Eating Plan and the importance of low-calorie dense foods. The concept of mindful eating will be introduced as well as the importance of paying attention to internal hunger signals. Triggers for non-hunger eating and techniques for dealing with triggers will be explored. The purpose of this lesson is to provide patients with the opportunity to review the basic principles of the Pritikin Eating Plan, discuss the value of eating mindfully and how to measure internal cues of hunger and fullness using the Hunger Scale. Patients will also discuss reasons for non-hunger eating and learn strategies to use for controlling emotional eating.  Targeting Your Nutrition Priorities Clinical staff led group instruction and group discussion with PowerPoint presentation and  patient guidebook. To enhance the learning environment the use of posters, models and videos may be added. Patients will learn how to determine their genetic susceptibility to disease by reviewing their family history. Patients will gain insight into the importance of diet as part of an overall healthy lifestyle in mitigating the impact of genetics and other environmental insults. The purpose of this lesson is to provide patients with the opportunity to assess their personal nutrition priorities by looking at their family history, their own health history and current risk factors. Patients will also be able to discuss ways of prioritizing and modifying  the Pritikin Eating Plan for their highest risk areas  Menu  Clinical staff led group instruction and group discussion with PowerPoint presentation and patient guidebook. To enhance the learning environment the use of posters, models and videos may be added. Using menus brought in from [employer], or printed from [employer], patients will apply the Pritikin dining out guidelines that were presented in the [employer] video. Patients will also be able to practice these guidelines in a variety of provided scenarios. The purpose of this lesson is to provide patients with the opportunity to practice hands-on learning of the Pritikin Dining Out guidelines with actual menus and practice scenarios.  Label Reading Clinical staff led group instruction and group discussion with PowerPoint presentation and patient guidebook. To enhance the learning environment the use of posters, models and videos may be added. Patients will review and discuss the Pritikin label reading guidelines presented in Pritikin's Label Reading Educational series video. Using fool labels brought in from local grocery stores and markets, patients will apply the label reading guidelines and determine if the packaged food meet the Pritikin guidelines. The purpose of this  lesson is to provide patients with the opportunity to review, discuss, and practice hands-on learning of the Pritikin Label Reading guidelines with actual packaged food labels. Cooking School  Pritikin's [employer] are designed to teach patients ways to prepare quick, simple, and affordable recipes at home. The importance of nutrition's role in chronic disease risk reduction is reflected in its emphasis in the overall Pritikin program. By learning how to prepare essential core Pritikin Eating Plan recipes, patients will increase control over what they eat; be able to customize the flavor of foods without the use of added salt, sugar, or fat; and improve the quality of the food they consume. By learning a set of core recipes which are easily assembled, quickly prepared, and affordable, patients are more likely to prepare more healthy foods at home. These workshops focus on convenient breakfasts, simple entres, side dishes, and desserts which can be prepared with minimal effort and are consistent with nutrition recommendations for cardiovascular risk reduction. Cooking [employer] are taught by a Armed forces logistics/support/administrative officer (RD) who has been trained by the [employer]. The chef or RD has a clear understanding of the importance of minimizing - if not completely eliminating - added fat, sugar, and sodium in recipes. Throughout the series of Cooking School Workshop sessions, patients will learn about healthy ingredients and efficient methods of cooking to build confidence in their capability to prepare    Cooking School weekly topics:  Adding Flavor- Sodium-Free  Fast and Healthy Breakfasts  Powerhouse Plant-Based Proteins  Satisfying Salads and Dressings  Simple Sides and Sauces  International Cuisine-Spotlight on the [employer] Zones  Delicious Desserts  Savory Soups  [employer] - Meals in a Astronomer Appetizers and Snacks  Comforting Weekend Breakfasts  One-Pot  Wonders   Fast Evening Meals  Landscape architect Your Pritikin Plate  WORKSHOPS   Healthy Mindset (Psychosocial):  Focused Goals, Sustainable Changes Clinical staff led group instruction and group discussion with PowerPoint presentation and patient guidebook. To enhance the learning environment the use of posters, models and videos may be added. Patients will be able to apply effective goal setting strategies to establish at least one personal goal, and then take consistent, meaningful action toward that goal. They will learn to identify common barriers to achieving personal goals and develop strategies to overcome  them. Patients will also gain an understanding of how our mind-set can impact our ability to achieve goals and the importance of cultivating a positive and growth-oriented mind-set. The purpose of this lesson is to provide patients with a deeper understanding of how to set and achieve personal goals, as well as the tools and strategies needed to overcome common obstacles which may arise along the way.  From Head to Heart: The Power of a Healthy Outlook  Clinical staff led group instruction and group discussion with PowerPoint presentation and patient guidebook. To enhance the learning environment the use of posters, models and videos may be added. Patients will be able to recognize and describe the impact of emotions and mood on physical health. They will discover the importance of self-care and explore self-care practices which may work for them. Patients will also learn how to utilize the 4 C's to cultivate a healthier outlook and better manage stress and challenges. The purpose of this lesson is to demonstrate to patients how a healthy outlook is an essential part of maintaining good health, especially as they continue their cardiac rehab journey.  Healthy Sleep for a Healthy Heart Clinical staff led group instruction and group discussion with PowerPoint presentation and  patient guidebook. To enhance the learning environment the use of posters, models and videos may be added. At the conclusion of this workshop, patients will be able to demonstrate knowledge of the importance of sleep to overall health, well-being, and quality of life. They will understand the symptoms of, and treatments for, common sleep disorders. Patients will also be able to identify daytime and nighttime behaviors which impact sleep, and they will be able to apply these tools to help manage sleep-related challenges. The purpose of this lesson is to provide patients with a general overview of sleep and outline the importance of quality sleep. Patients will learn about a few of the most common sleep disorders. Patients will also be introduced to the concept of "sleep hygiene," and discover ways to self-manage certain sleeping problems through simple daily behavior changes. Finally, the workshop will motivate patients by clarifying the links between quality sleep and their goals of heart-healthy living.   Recognizing and Reducing Stress Clinical staff led group instruction and group discussion with PowerPoint presentation and patient guidebook. To enhance the learning environment the use of posters, models and videos may be added. At the conclusion of this workshop, patients will be able to understand the types of stress reactions, differentiate between acute and chronic stress, and recognize the impact that chronic stress has on their health. They will also be able to apply different coping mechanisms, such as reframing negative self-talk. Patients will have the opportunity to practice a variety of stress management techniques, such as deep abdominal breathing, progressive muscle relaxation, and/or guided imagery.  The purpose of this lesson is to educate patients on the role of stress in their lives and to provide healthy techniques for coping with it.  Learning Barriers/Preferences:  Learning  Barriers/Preferences - 02/13/22 1525       Learning Barriers/Preferences   Learning Barriers Hearing    Learning Preferences Audio;Computer/Internet;Group Instruction;Individual Instruction;Pictoral;Skilled Demonstration;Verbal Instruction;Video;Written Material             Education Topics:  Knowledge Questionnaire Score:  Knowledge Questionnaire Score - 04/21/22 1502       Knowledge Questionnaire Score   Pre Score 24/24    Post Score 23/24             Core Components/Risk  Factors/Patient Goals at Admission:  Personal Goals and Risk Factors at Admission - 02/13/22 1526       Core Components/Risk Factors/Patient Goals on Admission    Weight Management Yes;Weight Loss    Intervention Weight Management: Develop a combined nutrition and exercise program designed to reach desired caloric intake, while maintaining appropriate intake of nutrient and fiber, sodium and fats, and appropriate energy expenditure required for the weight goal.;Weight Management: Provide education and appropriate resources to help participant work on and attain dietary goals.;Weight Management/Obesity: Establish reasonable short term and long term weight goals.;Obesity: Provide education and appropriate resources to help participant work on and attain dietary goals.    Admit Weight 219 lb 11.2 oz (99.7 kg)    Goal Weight: Long Term 190 lb (86.2 kg)    Expected Outcomes Short Term: Continue to assess and modify interventions until short term weight is achieved;Long Term: Adherence to nutrition and physical activity/exercise program aimed toward attainment of established weight goal;Weight Loss: Understanding of general recommendations for a balanced deficit meal plan, which promotes 1-2 lb weight loss per week and includes a negative energy balance of [phone] kcal/d;Understanding recommendations for meals to include 15-35% energy as protein, 25-35% energy from fat, 35-60% energy from carbohydrates, less than  200mg  of dietary cholesterol, 20-35 gm of total fiber daily;Understanding of distribution of calorie intake throughout the day with the consumption of 4-5 meals/snacks    Hypertension Yes    Intervention Monitor prescription use compliance.;Provide education on lifestyle modifcations including regular physical activity/exercise, weight management, moderate sodium restriction and increased consumption of fresh fruit, vegetables, and low fat dairy, alcohol moderation, and smoking cessation.    Expected Outcomes Short Term: Continued assessment and intervention until BP is < 140/78mm HG in hypertensive participants. < 130/15mm HG in hypertensive participants with diabetes, heart failure or chronic kidney disease.;Long Term: Maintenance of blood pressure at goal levels.    Lipids Yes    Intervention Provide education and support for participant on nutrition & aerobic/resistive exercise along with prescribed medications to achieve LDL 70mg , HDL >40mg .    Expected Outcomes Short Term: Participant states understanding of desired cholesterol values and is compliant with medications prescribed. Participant is following exercise prescription and nutrition guidelines.;Long Term: Cholesterol controlled with medications as prescribed, with individualized exercise RX and with personalized nutrition plan. Value goals: LDL < 70mg , HDL > 40 mg.    Personal Goal Other Yes    Personal Goal long and short the same: Flexibility, stamina and wt loss (goal 190 lbs), wants RD contult and more variety with food    Intervention Will continue to monitor pt and progress workloads as tolerated without sign or symptom    Expected Outcomes Pt will achieve his goals             Core Components/Risk Factors/Patient Goals Review:   Goals and Risk Factor Review     Row Name 02/26/22 1449 03/25/22 1626 04/17/22 1300         Core Components/Risk Factors/Patient Goals Review   Personal Goals Review Weight  Management/Obesity;Hypertension;Lipids Weight Management/Obesity;Hypertension;Lipids Weight Management/Obesity;Hypertension;Lipids     Review Rowe started intensive cardiac rehab on 02/26/22 and did well with exercise. Vital signs were stable Phylliss Bob is doing  well with exercise at intensive cardiac rehab . Vital signs have been  stable Rowe continues to do  well with exercise at intensive cardiac rehab . Vital signs have been  stable. Phylliss Bob will complete intensive cardiac rehab on 04/28/22     Expected  Outcomes Roew will continue to participate in intensive cardiac rehab for exercise, nutrition and lifestyle modifications Roew will continue to participate in intensive cardiac rehab for exercise, nutrition and lifestyle modifications Roew will continue to participate in intensive cardiac rehab for exercise, nutrition and lifestyle modifications              Core Components/Risk Factors/Patient Goals at Discharge (Final Review):   Goals and Risk Factor Review - 04/17/22 1300       Core Components/Risk Factors/Patient Goals Review   Personal Goals Review Weight Management/Obesity;Hypertension;Lipids    Review Phylliss Bob continues to do  well with exercise at intensive cardiac rehab . Vital signs have been  stable. Phylliss Bob will complete intensive cardiac rehab on 04/28/22    Expected Outcomes Roew will continue to participate in intensive cardiac rehab for exercise, nutrition and lifestyle modifications             ITP Comments:  ITP Comments     Row Name 02/13/22 1445 02/26/22 1447 03/25/22 1622 04/17/22 1255     ITP Comments Dr Driscilla Moats MD, Medical Director. Introduction to Pritikin Education/Intensive cardiac rehab. Initial Orientation Packet Reviewed with the patient 30 Day ITP Review. Rowe started intensive caridac rehab on 02/26/22 and did well with exercise 30 Day ITP Review. Phylliss Bob has good attendance and participation in  intensive caridac rehab 30 Day ITP Review. Phylliss Bob continues to  have   good attendance and participation in  intensive cardiac rehab. Phylliss Bob will complete intensive cardiac rehab on 04/28/22             Comments: See ITP Comments

## 2022-04-22 ENCOUNTER — Ambulatory Visit (INDEPENDENT_AMBULATORY_CARE_PROVIDER_SITE_OTHER): Payer: Medicare Other | Admitting: Thoracic Surgery (Cardiothoracic Vascular Surgery)

## 2022-04-22 ENCOUNTER — Encounter: Payer: Self-pay | Admitting: Thoracic Surgery (Cardiothoracic Vascular Surgery)

## 2022-04-22 ENCOUNTER — Ambulatory Visit
Admission: RE | Admit: 2022-04-22 | Discharge: 2022-04-22 | Disposition: A | Discharge status: Home / Self Care | Payer: Medicare Other | Source: Ambulatory Visit | Attending: Thoracic Surgery (Cardiothoracic Vascular Surgery) | Admitting: Thoracic Surgery (Cardiothoracic Vascular Surgery)

## 2022-04-22 ENCOUNTER — Other Ambulatory Visit: Payer: Self-pay | Admitting: Thoracic Surgery (Cardiothoracic Vascular Surgery)

## 2022-04-22 VITALS — BP 150/83 | HR 82 | Resp 20 | Ht 67.0 in | Wt 222.0 lb

## 2022-04-22 DIAGNOSIS — J90 Pleural effusion, not elsewhere classified: Secondary | ICD-10-CM

## 2022-04-22 DIAGNOSIS — Z951 Presence of aortocoronary bypass graft: Secondary | ICD-10-CM

## 2022-04-22 NOTE — Progress Notes (Signed)
[address]       [place] [address]             [phone]     HPI: [name] returns for follow-up of his elevated right hemidiaphragm.  Keith Murillo is a 72 year old man with a history of CAD, MI, CABG, PCI, hypertension, hyperlipidemia, hypothyroidism, obesity, sleep apnea, and prostate cancer.  He presented with a non-ST elevation MI around Thanksgiving.  He had coronary bypass grafting x 3 on 12/06/2021.  Postoperatively he had an elevated right hemidiaphragm.  Last saw him in the office on 02/20/2022.  He was still having some shortness of breath and orthopnea.  We did a CT which showed some atelectasis in the right lung base and elevation of the right hemidiaphragm.  He has been doing well.  No recurrent anginal symptoms.  He finishes cardiac rehab this week.  No anginal symptoms.  Does still have some limitations with his breathing although it is better than it was at his last visit.  Ran out of Lasix about a week ago.  Has not noticed any significant weight gain or lower swelling in his legs.  Past Medical History:  Diagnosis Date   Cancer    prostate   Chest pain    Complication of anesthesia    fighting with waking up   Coronary artery disease    Heart attack    Hypercholesterolemia    Hyperlipidemia    Hypertension    Ischemic heart disease September 2004   Known--with non-Q-wave myocardial infarction    Obesity    Sleep apnea      Current Outpatient Medications  Medication Sig Dispense Refill   acetaminophen (TYLENOL) 500 MG tablet Take 2 tablets (1,000 mg total) by mouth every 6 (six) hours as needed. 30 tablet 0   aspirin EC 81 MG tablet Take 1 tablet (81 mg total) by mouth daily.     Cholecalciferol (VITAMIN D-3) 125 MCG (5000 UT) TABS Take 5,000 Units by mouth daily.      clopidogrel (PLAVIX) 75 MG tablet Take 1 tablet (75 mg total) by mouth daily. 90 tablet 0   Coenzyme Q10 ([employer] COQ-10) 200 MG capsule Take 200 mg by  mouth daily.     DHEA 25 MG CAPS Take 25 mg by mouth daily.      diltiazem (CARDIZEM SR) 120 MG 12 hr capsule Take 1 capsule (120 mg total) by mouth every 12 (twelve) hours. 60 capsule 3   Evolocumab (REPATHA SURECLICK) 140 MG/ML SOAJ Inject 140 mg into the skin every 14 (fourteen) days. 2 mL 5   ezetimibe (ZETIA) 10 MG tablet Take 1 tablet (10 mg total) by mouth daily. 30 tablet 3   furosemide (LASIX) 40 MG tablet Take 1 tablet (40 mg total) by mouth daily as needed. For weight gain of 3 lbs in 24 hours 30 tablet 3   loperamide (IMODIUM A-D) 2 MG tablet Take 1 tablet (2 mg total) by mouth 4 (four) times daily as needed for diarrhea or loose stools. 30 tablet 0   Magnesium Citrate POWD Take 1 Scoop by mouth daily as needed (regularity).     melatonin 5 MG TABS Take 5 mg by mouth at bedtime.     Menaquinone-7 (VITAMIN K2 PO) Take 150 mcg by mouth daily. MK-7     Multiple Vitamins-Minerals (IMMUNE SUPPORT PO) Take 520 mg by mouth daily. Vitac-ld Liposomal Vitamin     NON FORMULARY Take 1  capsule by mouth daily. HAIOTB Circulation Syn3rgy (Beet Root, L-Arginine, Horse Chestnut)     NP THYROID 60 MG tablet Take 60 mg by mouth every morning.     OVER THE COUNTER MEDICATION Take 2 capsules by mouth daily. Muscadine grape capsules - 650 mg each - take 2 capsules by mouth daily     potassium chloride (KLOR-CON M) 10 MEQ tablet Take 2 tablets (20 mEq total) by mouth daily as needed. Take when you take a Lasix (Furosemide) 60 tablet 3   Pregnenolone Micronized (PREGNENOLONE PO) Take 75 mg by mouth daily.     Probiotic Product (PROBIOTIC PO) Take 1 capsule by mouth daily. 20 billion     QUERCETIN PO Take 1 capsule by mouth daily. Bio-Quercetin     sildenafil (REVATIO) 20 MG tablet take 2 to 5 tablets by mouth 1 hour prior to intercourse 90 tablet 1   testosterone cypionate (DEPOTESTOSTERONE CYPIONATE) 200 MG/ML injection Inject 0.5 mLs into the skin once a week. Once a week     traMADol (ULTRAM) 50 MG  tablet Take 1 tablet (50 mg total) by mouth every 4 (four) hours as needed for moderate pain. 30 tablet 0   traZODone (DESYREL) 50 MG tablet Take 0.5-1 tablets (25-50 mg total) by mouth at bedtime as needed for sleep. 30 tablet 0   Turmeric 500 MG TABS Take 500 mg by mouth daily.     UNABLE TO FIND Take 1 drop by mouth daily as needed. Med Name: Cortexi for tinnitus     Zinc 50 MG TABS Take 50 mg by mouth daily.     No current facility-administered medications for this visit.    Physical Exam BP (!) 150/83   Pulse 82   Resp 20   Ht  (1.702 m)   Wt 222 lb (100.7 kg)   SpO2 97% Comment: RA  BMI 34.45 kg/m  72 year old man in no acute stress Alert and oriented x 3 with no focal deficits Lungs diminished at right base otherwise clear Cardiac regular rate and rhythm Sternum stable Trace edema both lower extremities  Diagnostic Tests: CHEST - 2 VIEW   COMPARISON:  February 11, 2022.   FINDINGS: The heart size and mediastinal contours are within normal limits. Status post coronary bypass graft. Left lung is clear. Mild right middle lobe subsegmental atelectasis or scarring is again noted. The visualized skeletal structures are unremarkable.   IMPRESSION: Mild right basilar subsegmental atelectasis or scarring.     Electronically Signed   By: Lupita Raider M.D.   On: 04/22/2022 15:30 I personally reviewed the chest x-ray images.  Improved aeration at the right base.  Right hemidiaphragm still elevated but slightly better than his last chest x-ray.  Impression: Keith Murillo is a 72 year old man with a history of CAD, MI, CABG, PCI, hypertension, hyperlipidemia, hypothyroidism, obesity, sleep apnea, and prostate cancer.  He presented with a non-ST elevation MI and underwent coronary bypass grafting on 12/06/2021.  From a cardiac standpoint is doing well.  No recurrent angina.  Has nearly completed cardiac rehabilitation.  Still some limitation to physical  activity due to breathing.  I think his chest x-ray looks better than it did at his last visit but right hemidiaphragm still elevated relative to baseline.  I will plan to see him back in 2 months with a sniff test to see if there is any paradoxical movement of the diaphragm contracting more normally.  He ran out of Lasix about a week  ago.  He has not noticed any increase in swelling or weight gain.  I think at this point we can just watch him and see if he starts having fluid retention.  He knows to call if he notices increased swelling or weight gain of greater than 3 pounds in a day or 5 pounds in 3 days.  Also if he were to have any worsening shortness of breath.  Plan: Return in 2 months with sniff test  Loreli Slot, MD [facility] [phone]

## 2022-04-23 ENCOUNTER — Encounter (HOSPITAL_COMMUNITY)
Admission: RE | Admit: 2022-04-23 | Discharge: 2022-04-23 | Disposition: A | Discharge status: Home / Self Care | Payer: Medicare Other | Source: Ambulatory Visit | Attending: Cardiovascular Disease | Admitting: Cardiovascular Disease

## 2022-04-23 DIAGNOSIS — Z951 Presence of aortocoronary bypass graft: Secondary | ICD-10-CM | POA: Diagnosis not present

## 2022-04-25 ENCOUNTER — Encounter (HOSPITAL_COMMUNITY)
Admission: RE | Admit: 2022-04-25 | Discharge: 2022-04-25 | Disposition: A | Discharge status: Home / Self Care | Payer: Medicare Other | Source: Ambulatory Visit | Attending: Cardiovascular Disease | Admitting: Cardiovascular Disease

## 2022-04-25 VITALS — BP 128/80 | HR 98 | Ht 68.0 in | Wt 224.0 lb

## 2022-04-25 DIAGNOSIS — Z951 Presence of aortocoronary bypass graft: Secondary | ICD-10-CM

## 2022-04-25 NOTE — Progress Notes (Signed)
Discharge Progress Report  Patient Details  Name: KINGSTYN DERUITER MRN: [id number] Date of Birth: 1950-11-12 Referring Provider:   Flowsheet Row INTENSIVE CARDIAC REHAB ORIENT from 02/13/2022 in [facility]  Referring Provider Dr. Julien Nordmann, MD        Number of Visits: 64  Reason for Discharge:  Patient reached a stable level of exercise. Patient independent in their exercise. Patient has met program and personal goals.  Smoking History:  Social History   Tobacco Use  Smoking Status Never  Smokeless Tobacco Never    Diagnosis:  12/06/21 S/P CABG x 3  ADL UCSD:   Initial Exercise Prescription:  Initial Exercise Prescription - 02/13/22 1500       Date of Initial Exercise RX and Referring Provider   Date 02/13/22    Referring Provider Dr. Julien Nordmann, MD    Expected Discharge Date 04/25/22      Bike   Level 2    Minutes 15    METs 2.3      NuStep   Level 2    SPM 85    Minutes 15    METs 2.3      Prescription Details   Frequency (times per week) 3    Duration Progress to 30 minutes of continuous aerobic without signs/symptoms of physical distress      Intensity   THRR 40-80% of Max Heartrate 60-120    Ratings of Perceived Exertion 11-13    Perceived Dyspnea 0-4      Progression   Progression Continue progressive overload as per policy without signs/symptoms or physical distress.      Resistance Training   Training Prescription Yes    Weight 4    Reps 10-15             Discharge Exercise Prescription (Final Exercise Prescription Changes):  Exercise Prescription Changes - 04/18/22 1023       Response to Exercise   Blood Pressure (Admit) 132/60    Blood Pressure (Exercise) 162/60    Blood Pressure (Exit) 102/62    Heart Rate (Admit) 81 bpm    Heart Rate (Exercise) 116 bpm    Heart Rate (Exit) 88 bpm    Rating of Perceived Exertion (Exercise) 12    Symptoms None     Duration Continue with 30 min of aerobic exercise without signs/symptoms of physical distress.    Intensity THRR unchanged      Progression   Progression Continue to progress workloads to maintain intensity without signs/symptoms of physical distress.    Average METs 3.9      Resistance Training   Training Prescription Yes    Weight 4 lbs    Reps 10-15    Time 10 Minutes      Interval Training   Interval Training No      Bike   Level 4    Watts 74    Minutes 15    METs 4.2      NuStep   Level 4    SPM 90    Minutes 15    METs 4      Track   Laps 15   1770 ft   Minutes 6   6-minute walk test   METs 3.57      Home Exercise Plan   Plans to continue exercise at Home (comment)   Walking   Frequency Add 2 additional days to program exercise sessions.  Initial Home Exercises Provided 03/19/22             Functional Capacity:  6 Minute Walk     Row Name 02/13/22 1510 04/18/22 1040       6 Minute Walk   Phase Initial Discharge    Distance 1530 feet 1770 feet    Distance % Change -- 15.69 %    Distance Feet Change -- 240 ft    Walk Time 6 minutes 6 minutes    # of Rest Breaks 0 0    MPH 2.9 3.76    METS 3.37 3.76    RPE 11 12    Perceived Dyspnea  0 0    VO2 Peak 11.78 13.17    Symptoms Yes (comment) No    Comments Mild chest pressure post walk test, resolved with rest, --    Resting HR 105 bpm 81 bpm    Resting BP 124/76 132/60    Resting Oxygen Saturation  97 % --    Exercise Oxygen Saturation  during 6 min walk 97 % --    Max Ex. HR 117 bpm 116 bpm    Max Ex. BP 158/90 162/60    2 Minute Post BP 138/82 102/62             Psychological, QOL, Others - Outcomes: PHQ 2/9:    04/25/2022   11:47 AM 03/27/2022    1:58 PM 02/13/2022    2:59 PM 12/26/2021    8:45 AM 01/30/2017    8:24 AM  Depression screen PHQ 2/9  Decreased Interest 0 0 0 0 0  Down, Depressed, Hopeless 0 0 0 0 0  PHQ - 2 Score 0 0 0 0 0  Altered sleeping 0 0 0    Tired,  decreased energy 1 0 1    Change in appetite 0 0 0    Feeling bad or failure about yourself  0 0 0    Trouble concentrating 0 0 0    Moving slowly or fidgety/restless 0 0 0    Suicidal thoughts 0 0 0    PHQ-9 Score 1 0 1    Difficult doing work/chores Not difficult at all Not difficult at all Not difficult at all      Quality of Life:  Quality of Life - 04/21/22 1507       Quality of Life   Select Quality of Life      Quality of Life Scores   Health/Function Pre 18.1 %    Health/Function Post 20.63 %    Health/Function % Change 13.98 %    Socioeconomic Pre 22.14 %    Socioeconomic Post 21.5 %    Socioeconomic % Change  -2.89 %    Psych/Spiritual Pre 20.57 %    Psych/Spiritual Post 21 %    Psych/Spiritual % Change 2.09 %    Family Pre 28.8 %    Family Post 27.1 %    Family % Change -5.9 %    GLOBAL Pre 21.01 %    GLOBAL Post 21.84 %    GLOBAL % Change 3.95 %             Personal Goals: Goals established at orientation with interventions provided to work toward goal.  Personal Goals and Risk Factors at Admission - 02/13/22 1526       Core Components/Risk Factors/Patient Goals on Admission    Weight Management Yes;Weight Loss    Intervention Weight Management: Develop a combined nutrition and  exercise program designed to reach desired caloric intake, while maintaining appropriate intake of nutrient and fiber, sodium and fats, and appropriate energy expenditure required for the weight goal.;Weight Management: Provide education and appropriate resources to help participant work on and attain dietary goals.;Weight Management/Obesity: Establish reasonable short term and long term weight goals.;Obesity: Provide education and appropriate resources to help participant work on and attain dietary goals.    Admit Weight 219 lb 11.2 oz (99.7 kg)    Goal Weight: Long Term 190 lb (86.2 kg)    Expected Outcomes Short Term: Continue to assess and modify interventions until short term  weight is achieved;Long Term: Adherence to nutrition and physical activity/exercise program aimed toward attainment of established weight goal;Weight Loss: Understanding of general recommendations for a balanced deficit meal plan, which promotes 1-2 lb weight loss per week and includes a negative energy balance of [phone] kcal/d;Understanding recommendations for meals to include 15-35% energy as protein, 25-35% energy from fat, 35-60% energy from carbohydrates, less than 200mg  of dietary cholesterol, 20-35 gm of total fiber daily;Understanding of distribution of calorie intake throughout the day with the consumption of 4-5 meals/snacks    Hypertension Yes    Intervention Monitor prescription use compliance.;Provide education on lifestyle modifcations including regular physical activity/exercise, weight management, moderate sodium restriction and increased consumption of fresh fruit, vegetables, and low fat dairy, alcohol moderation, and smoking cessation.    Expected Outcomes Short Term: Continued assessment and intervention until BP is < 140/68mm HG in hypertensive participants. < 130/47mm HG in hypertensive participants with diabetes, heart failure or chronic kidney disease.;Long Term: Maintenance of blood pressure at goal levels.    Lipids Yes    Intervention Provide education and support for participant on nutrition & aerobic/resistive exercise along with prescribed medications to achieve LDL 70mg , HDL >40mg .    Expected Outcomes Short Term: Participant states understanding of desired cholesterol values and is compliant with medications prescribed. Participant is following exercise prescription and nutrition guidelines.;Long Term: Cholesterol controlled with medications as prescribed, with individualized exercise RX and with personalized nutrition plan. Value goals: LDL < 70mg , HDL > 40 mg.    Personal Goal Other Yes    Personal Goal long and short the same: Flexibility, stamina and wt loss (goal 190  lbs), wants RD contult and more variety with food    Intervention Will continue to monitor pt and progress workloads as tolerated without sign or symptom    Expected Outcomes Pt will achieve his goals              Personal Goals Discharge:  Goals and Risk Factor Review     Row Name 02/26/22 1449 03/25/22 1626 04/17/22 1300         Core Components/Risk Factors/Patient Goals Review   Personal Goals Review Weight Management/Obesity;Hypertension;Lipids Weight Management/Obesity;Hypertension;Lipids Weight Management/Obesity;Hypertension;Lipids     Review Rowe started intensive cardiac rehab on 02/26/22 and did well with exercise. Vital signs were stable Phylliss Bob is doing  well with exercise at intensive cardiac rehab . Vital signs have been  stable Rowe continues to do  well with exercise at intensive cardiac rehab . Vital signs have been  stable. Phylliss Bob will complete intensive cardiac rehab on 04/28/22     Expected Outcomes Roew will continue to participate in intensive cardiac rehab for exercise, nutrition and lifestyle modifications Roew will continue to participate in intensive cardiac rehab for exercise, nutrition and lifestyle modifications [place]ew will continue to participate in intensive cardiac rehab for exercise, nutrition and lifestyle modifications  Exercise Goals and Review:  Exercise Goals     Row Name 02/13/22 1521             Exercise Goals   Increase Physical Activity Yes       Intervention Provide advice, education, support and counseling about physical activity/exercise needs.;Develop an individualized exercise prescription for aerobic and resistive training based on initial evaluation findings, risk stratification, comorbidities and participant's personal goals.       Expected Outcomes Short Term: Attend rehab on a regular basis to increase amount of physical activity.;Long Term: Exercising regularly at least 3-5 days a week.;Long Term: Add in home exercise  to make exercise part of routine and to increase amount of physical activity.       Increase Strength and Stamina Yes       Intervention Provide advice, education, support and counseling about physical activity/exercise needs.;Develop an individualized exercise prescription for aerobic and resistive training based on initial evaluation findings, risk stratification, comorbidities and participant's personal goals.       Expected Outcomes Short Term: Increase workloads from initial exercise prescription for resistance, speed, and METs.;Short Term: Perform resistance training exercises routinely during rehab and add in resistance training at home;Long Term: Improve cardiorespiratory fitness, muscular endurance and strength as measured by increased METs and functional capacity ( )       Able to understand and use rate of perceived exertion (RPE) scale Yes       Intervention Provide education and explanation on how to use RPE scale       Expected Outcomes Short Term: Able to use RPE daily in rehab to express subjective intensity level;Long Term:  Able to use RPE to guide intensity level when exercising independently       Knowledge and understanding of Target Heart Rate Range (THRR) Yes       Intervention Provide education and explanation of THRR including how the numbers were predicted and where they are located for reference       Expected Outcomes Short Term: Able to state/look up THRR;Long Term: Able to use THRR to govern intensity when exercising independently;Short Term: Able to use daily as guideline for intensity in rehab       Understanding of Exercise Prescription Yes       Intervention Provide education, explanation, and written materials on patient's individual exercise prescription       Expected Outcomes Short Term: Able to explain program exercise prescription;Long Term: Able to explain home exercise prescription to exercise independently                Exercise Goals  Re-Evaluation:  Exercise Goals Re-Evaluation     Row Name 02/26/22 1123 03/19/22 1117 04/18/22 1050 04/21/22 1022       Exercise Goal Re-Evaluation   Exercise Goals Review Increase Physical Activity;Able to understand and use rate of perceived exertion (RPE) scale Increase Physical Activity;Able to understand and use rate of perceived exertion (RPE) scale;Increase Strength and Stamina;Knowledge and understanding of Target Heart Rate Range (THRR);Able to check pulse independently;Understanding of Exercise Prescription Increase Physical Activity;Able to understand and use rate of perceived exertion (RPE) scale;Increase Strength and Stamina;Knowledge and understanding of Target Heart Rate Range (THRR);Able to check pulse independently;Understanding of Exercise Prescription Increase Physical Activity;Able to understand and use rate of perceived exertion (RPE) scale;Increase Strength and Stamina;Knowledge and understanding of Target Heart Rate Range (THRR);Able to check pulse independently;Understanding of Exercise Prescription    Comments Patient able to understand and use RPE scale appropriately. Reviewed  exercise prescription with patient. Patient is walking and has 5-6 lb hand weights at home that he can use for his resistance training. Discussed increasing hand weights from 4 to 5 lbs, and patient is amenable to this. Patient's functional capacity increased 16% as measured by 6-minute walk test. Patient is progressing well and is scheduled to complete cardiac rehab next Friday. Patient's grip strength increased 19% as measured by grip strength test. Patient plans to continue walking 30 minutes at least 3 days/ week. Patient has 6 lb hand weights that he plans to use for his resistance training. Increased hand weights at cardiac rehab today from 4 to 5 lbs.    Expected Outcomes Progress workloads as tolerated to help increase cardiorespiratory fitness. Patient will walk at home at least 30 minutes 2-3  days/week in addition to exercise at cardiac rehab to help achieve 150 minutes of aerobic exercise/week. Increase hand weights from 4 to 5 lbs at CR. Continue to progress workloads as tolerated. Patient will continue home exercise routine upon completion of the cardiac rehab program.             Nutrition & Weight - Outcomes:  Pre Biometrics - 02/13/22 1521       Pre Biometrics   Waist Circumference 43 inches    Hip Circumference 44 inches    Waist to Hip Ratio 0.98 %    Triceps Skinfold 14 mm    % Body Fat 30.9 %    Grip Strength 42 kg    Flexibility --   pt unable to reach   Single Leg Stand 6.7 seconds             Post Biometrics - 04/21/22 1022        Post  Biometrics   Waist Circumference 42 inches    Hip Circumference 43.25 inches    Waist to Hip Ratio 0.97 %    Triceps Skinfold 13 mm    Grip Strength 50 kg    Flexibility 9 in    Single Leg Stand 24 seconds             Nutrition:  Nutrition Therapy & Goals - 04/23/22 0854       Nutrition Therapy   Diet Heart Healthy Diet      Personal Nutrition Goals   Nutrition Goal Patient to identify strategies for reducing cardiovascular risk by attending the weekly [employer] education and nutrition series    Personal Goal #2 Patient to improve diet quality by using the plate method as a daily guide for meal planning to include lean protein/plant protein, fruits, vegetables, whole grains, nonfat dairy as part of well balanced diet    Comments Goals in progress. Lipids improved but LDL is not at goal (<55); Repatha/PCSK9 was started following cardiology follow-up on 02/25/22. Phylliss Bob does continue to attend the [employer] education and nutrition series. He is up 3# since starting with our program. Phylliss Bob graduates this week. Phylliss Bob will benefit from participation in the [employer] eating plan and exercise longterm.      Intervention Plan   Intervention Prescribe, educate and counsel regarding individualized specific dietary  modifications aiming towards targeted core components such as weight, hypertension, lipid management, diabetes, heart failure and other comorbidities.;Nutrition handout(s) given to patient.    Expected Outcomes Short Term Goal: Understand basic principles of dietary content, such as calories, fat, sodium, cholesterol and nutrients.;Long Term Goal: Adherence to prescribed nutrition plan.             Nutrition  Discharge:  Nutrition Assessments - 04/24/22 1420       Rate Your Plate Scores   Post Score 63             Education Questionnaire Score:  Knowledge Questionnaire Score - 04/21/22 1502       Knowledge Questionnaire Score   Pre Score 24/24    Post Score 23/24             Goals reviewed with patient; copy given to patient.Pt graduates from  Intensive cardiac rehab program on 04/25/22 with completion of  22 exercise and 20 education sessions. Pt maintained good attendance and progressed nicely during their participation in rehab as evidenced by increased MET level.   Medication list reconciled. Repeat  PHQ score- 1 .  Pt has made significant lifestyle changes and should be commended for their success. Rowe achieved their goals during cardiac rehab.   Pt plans to continue exercise at by walking and doing yard work. Rowe increased his distance on his post exercise walk test by 240 feet. We are proud of Rowe's progress! Thayer Headings RN BSN

## 2022-04-28 ENCOUNTER — Encounter (HOSPITAL_COMMUNITY): Payer: Medicare Other

## 2022-05-10 DIAGNOSIS — H1089 Other conjunctivitis: Secondary | ICD-10-CM | POA: Diagnosis not present

## 2022-05-10 DIAGNOSIS — J0140 Acute pansinusitis, unspecified: Secondary | ICD-10-CM | POA: Diagnosis not present

## 2022-05-14 ENCOUNTER — Telehealth: Payer: Self-pay | Admitting: Nurse Practitioner

## 2022-05-14 DIAGNOSIS — I8393 Asymptomatic varicose veins of bilateral lower extremities: Secondary | ICD-10-CM

## 2022-05-14 NOTE — Telephone Encounter (Signed)
   Reason for Referral Request: Having some issues with veins in both legs   Has patient been seen PCP for this complaint? Yes   No,  please schedule patient for appointment for complaint.  Patient scheduled on:   Yes, please find out following information.  Referral for which specialty: Vascular/ Vein specialist   Preferred office/provider: [facility], patient has been seen at this office previously

## 2022-05-15 NOTE — Telephone Encounter (Signed)
Referral placed.

## 2022-05-15 NOTE — Telephone Encounter (Signed)
Called pt and he stated that the vein came to the surface and made a scab. Patient states that they are not swollen, and does not hurt.

## 2022-05-15 NOTE — Telephone Encounter (Signed)
Can we find out if they are hurting him or he is having swelling? Just need to have the reason for the referral

## 2022-05-16 ENCOUNTER — Other Ambulatory Visit (INDEPENDENT_AMBULATORY_CARE_PROVIDER_SITE_OTHER): Payer: Self-pay | Admitting: Nurse Practitioner

## 2022-05-16 DIAGNOSIS — I83893 Varicose veins of bilateral lower extremities with other complications: Secondary | ICD-10-CM

## 2022-05-19 ENCOUNTER — Other Ambulatory Visit: Payer: Self-pay | Admitting: Thoracic Surgery (Cardiothoracic Vascular Surgery)

## 2022-05-19 ENCOUNTER — Other Ambulatory Visit (HOSPITAL_COMMUNITY): Payer: Self-pay

## 2022-05-19 ENCOUNTER — Telehealth: Payer: Self-pay

## 2022-05-19 DIAGNOSIS — Z951 Presence of aortocoronary bypass graft: Secondary | ICD-10-CM

## 2022-05-19 NOTE — Telephone Encounter (Signed)
Pharmacy Patient Advocate Encounter   Received notification from [facility] that prior authorization for REPATHA 140MG /ML is required/requested.   PA submitted on 5.13.24 to [facility] Medicare (ins) via CoverMyMeds Key or (Medicaid) confirmation # [id number]   Status is pending

## 2022-05-20 ENCOUNTER — Ambulatory Visit (INDEPENDENT_AMBULATORY_CARE_PROVIDER_SITE_OTHER): Payer: Medicare Other

## 2022-05-20 ENCOUNTER — Ambulatory Visit (INDEPENDENT_AMBULATORY_CARE_PROVIDER_SITE_OTHER): Payer: Medicare Other | Admitting: Nurse Practitioner

## 2022-05-20 VITALS — BP 119/77 | HR 86 | Resp 18 | Ht 67.0 in | Wt 222.0 lb

## 2022-05-20 DIAGNOSIS — E782 Mixed hyperlipidemia: Secondary | ICD-10-CM

## 2022-05-20 DIAGNOSIS — I83893 Varicose veins of bilateral lower extremities with other complications: Secondary | ICD-10-CM | POA: Diagnosis not present

## 2022-05-20 DIAGNOSIS — I83892 Varicose veins of left lower extremities with other complications: Secondary | ICD-10-CM | POA: Diagnosis not present

## 2022-05-21 ENCOUNTER — Encounter (INDEPENDENT_AMBULATORY_CARE_PROVIDER_SITE_OTHER): Payer: Self-pay | Admitting: Nurse Practitioner

## 2022-05-21 NOTE — Progress Notes (Signed)
Subjective:    Patient ID: Keith Murillo, male    DOB: 1950/12/06, 72 y.o.   MRN: [id number] Chief Complaint  Patient presents with   Establish Care    Keith Murillo is a 72 year old male who presents today approximately 5 years since his last visit.  The patient previously had issues with bleeding of his varicose veins lower extremity and was treated with sclerotherapy in 2019.  Since that time he has not had any significant issues until recently.  He recently had an episode where he had some significant bleeding from the left lower extremity again.  Since his last visit he does have some pain and discomfort and large varicosities in the left calf area.  He notes that these can be somewhat tender and uncomfortable at times.  Graduated compression stockings, Class I (20-30 mmHg), have been worn but the stockings do not eliminate the leg pain, nor have they stop the risk of bleeding.  Over-the-counter analgesics do not improve the symptoms. The degree of discomfort continues to interfere with daily activities. The patient notes the pain in the legs is causing problems with daily exercise, at the workplace and even with household activities and maintenance such as standing in the kitchen preparing meals and doing dishes.  There is also significant concern about recurrent bleeding episodes understandably.  Venous ultrasound shows normal deep venous system, no evidence of acute or chronic DVT.  Superficial reflux is present throughout the great saphenous vein in the left lower extremity beginning at the saphenofemoral junction.  He also has reflux in the small saphenous vein as well.  This is a notable change from the previous studies done in 2019.    Review of Systems  Hematological:  Bruises/bleeds easily.  All other systems reviewed and are negative.      Objective:   Physical Exam Vitals reviewed.  HENT:     Head: Normocephalic.  Cardiovascular:     Rate and Rhythm: Normal rate.      Pulses: Normal pulses.  Pulmonary:     Effort: Pulmonary effort is normal.  Skin:    General: Skin is warm and dry.     Comments: Very large varicosity in left calf  Neurological:     Mental Status: He is alert and oriented to person, place, and time.  Psychiatric:        Mood and Affect: Mood normal.        Behavior: Behavior normal.        Thought Content: Thought content normal.        Judgment: Judgment normal.     BP 119/77 (BP Location: Left Arm)   Pulse 86   Resp 18   Ht 5\' 7"  (1.702 m)   Wt 222 lb (100.7 kg)   BMI 34.77 kg/m   Past Medical History:  Diagnosis Date   Cancer [facility])    prostate   Chest pain    Complication of anesthesia    fighting with waking up   Coronary artery disease    Heart attack (HCC)    Hypercholesterolemia    Hyperlipidemia    Hypertension    Ischemic heart disease September 2004   Known--with non-Q-wave myocardial infarction    Obesity    Sleep apnea     Social History   Socioeconomic History   Marital status: Married    Spouse name: Keith Murillo   Number of children: 3   Years of education: 16   Highest education level: Bachelor's degree (e.g.,  BA, AB, BS)  Occupational History   Not on file  Tobacco Use   Smoking status: Never   Smokeless tobacco: Never  Vaping Use   Vaping Use: Never used  Substance and Sexual Activity   Alcohol use: Yes    Comment: occas   Drug use: No   Sexual activity: Yes  Other Topics Concern   Not on file  Social History Narrative   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   Social Determinants of Corporate investment banker Strain: Not on file  Food Insecurity: No Food Insecurity (11/28/2021)   Hunger Vital Sign    Worried About Running Out of Food in the Last Year: Never true    Ran Out of Food in the Last Year: Never true  Transportation Needs: No Transportation Needs (11/28/2021)   PRAPARE - Administrator, Civil Service (Medical): No    Lack of Transportation  (Non-Medical): No  Physical Activity: Not on file  Stress: Not on file  Social Connections: Not on file  Intimate Partner Violence: Not At Risk (11/28/2021)   Humiliation, Afraid, Rape, and Kick questionnaire    Fear of Current or Ex-Partner: No    Emotionally Abused: No    Physically Abused: No    Sexually Abused: No    Past Surgical History:  Procedure Laterality Date   CARDIAC CATHETERIZATION  11/2007   CORONARY ARTERY BYPASS GRAFT N/A 12/06/2021   Procedure: CORONARY ARTERY BYPASS GRAFTING (CABG) X 3 BYPASSES USING LEFT INTERNAL MAMMARY ARTERY AND RIGHT LEG GREATER SAPHENOUS VEIN HARVESTED ENDOSCOPICALLY;  Surgeon: Loreli Slot, MD;  Location: [facility] OR;  Service: Open Heart Surgery;  Laterality: N/A;   INGUINAL HERNIA REPAIR Right 09/22/2019   Procedure: RIGHT INGUINAL HERNIA WITH MESH;  Surgeon: Harriette Bouillon, MD;  Location: [facility] OR;  Service: General;  Laterality: Right;  TAP BLOCK   LEFT HEART CATH AND CORONARY ANGIOGRAPHY N/A 11/29/2021   Procedure: LEFT HEART CATH AND CORONARY ANGIOGRAPHY;  Surgeon: Marykay Lex, MD;  Location: [facility] INVASIVE CV LAB;  Service: Cardiovascular;  Laterality: N/A;   TEE WITHOUT CARDIOVERSION N/A 03/16/2018   Procedure: TRANSESOPHAGEAL ECHOCARDIOGRAM (TEE);  Surgeon: Elease Hashimoto Deloris Ping, MD;  Location: [facility] ENDOSCOPY;  Service: Cardiovascular;  Laterality: N/A;   TEE WITHOUT CARDIOVERSION N/A 12/06/2021   Procedure: TRANSESOPHAGEAL ECHOCARDIOGRAM (TEE);  Surgeon: Loreli Slot, MD;  Location: [facility] OR;  Service: Open Heart Surgery;  Laterality: N/A;    Family History  Problem Relation Age of Onset   Cancer Mother        larynx   Hearing loss Father    Cancer Maternal Grandmother    Parkinson's disease Maternal Grandfather    Stroke Maternal Grandfather    Stroke Paternal Grandmother    Heart attack Paternal Grandfather     Allergies  Allergen Reactions   Ace Inhibitors     cough   Beta Adrenergic Blockers     Fatigue   Codeine  Other (See Comments)    Other reaction(s): Unknown   Lisinopril Cough   Statins     myalgias       Latest Ref Rng & Units 12/26/2021    9:58 AM 12/09/2021    1:48 AM 12/08/2021    4:39 AM  CBC  WBC 4.0 - 10.5 K/uL 10.4  18.0  16.9   Hemoglobin 13.0 - 17.0 g/dL 16.1  09.6  04.5   Hematocrit 39.0 - 52.0 % 43.9  41.0  43.5   Platelets 150.0 -  400.0 K/uL 499.0  274  261       CMP     Component Value Date/Time   NA 133 (L) 12/09/2021 0148   K 4.2 12/09/2021 0148   CL 99 12/09/2021 0148   CO2 25 12/09/2021 0148   GLUCOSE 146 (H) 12/09/2021 0148   BUN 28 (H) 12/09/2021 0148   CREATININE 1.06 12/09/2021 0148   CALCIUM 8.2 (L) 12/09/2021 0148   PROT 6.4 12/26/2021 0958   PROT 6.3 03/03/2019 0840   ALBUMIN 4.0 12/26/2021 0958   ALBUMIN 4.2 03/03/2019 0840   AST 14 12/26/2021 0958   ALT 14 12/26/2021 0958   ALKPHOS 89 12/26/2021 0958   BILITOT 0.8 12/26/2021 0958   BILITOT 0.5 03/03/2019 0840   GFRNONAA >60 12/09/2021 0148   GFRAA >60 09/19/2019 1100     No results found.     Assessment & Plan:   1. Hemorrhage of varicose veins of lower extremity, left Recommend  I have reviewed my previous  discussion with the patient regarding  varicose veins and why they cause symptoms. Patient will continue  wearing graduated compression stockings class 1 on a daily basis, beginning first thing in the morning and removing them in the evening.  The patient is CEAP [id number].  The patient has been wearing compression for more than 12 weeks with no or little benefit.  The patient has been exercising daily for more than 12 weeks. The patient has been elevating and taking OTC pain medications for more than 12 weeks.  None of these have have eliminated the pain related to the varicose veins and venous reflux or the discomfort regarding venous congestion.    In addition, behavioral modification including elevation during the day was again discussed and this will continue.  The patient has  utilized over the counter pain medications and has been exercising.  However, at this time conservative therapy has not alleviated the patient's symptoms of leg pain and swelling.  Additionally the conservative symptoms have not helped to stop him from having episodes of extensive bleeding with the most recent being several weeks ago.  Recommend: laser ablation   left great saphenous veins to eliminate the symptoms of pain and swelling of the lower extremities caused by the severe superficial venous reflux disease.   2. Mixed hyperlipidemia Continue statin as ordered and reviewed, no changes at this time   Current Outpatient Medications on File Prior to Visit  Medication Sig Dispense Refill   acetaminophen (TYLENOL) 500 MG tablet Take 2 tablets (1,000 mg total) by mouth every 6 (six) hours as needed. 30 tablet 0   aspirin EC 81 MG tablet Take 1 tablet (81 mg total) by mouth daily.     Cholecalciferol (VITAMIN D-3) 125 MCG (5000 UT) TABS Take 5,000 Units by mouth daily.      clopidogrel (PLAVIX) 75 MG tablet Take 1 tablet (75 mg total) by mouth daily. 90 tablet 0   Coenzyme Q10 ([facility] COQ-10) 200 MG capsule Take 200 mg by mouth daily.     DHEA 25 MG CAPS Take 25 mg by mouth daily.      diltiazem (CARDIZEM SR) 120 MG 12 hr capsule Take 1 capsule (120 mg total) by mouth every 12 (twelve) hours. 60 capsule 3   ezetimibe (ZETIA) 10 MG tablet Take 1 tablet (10 mg total) by mouth daily. 30 tablet 3   Magnesium Citrate POWD Take 1 Scoop by mouth daily as needed (regularity).     melatonin 5 MG TABS  Take 5 mg by mouth at bedtime.     Menaquinone-7 (VITAMIN K2 PO) Take 150 mcg by mouth daily. MK-7     NON FORMULARY Take 1 capsule by mouth daily. HAIOTB Circulation Syn3rgy (Beet Root, L-Arginine, Horse Chestnut)     NP THYROID 60 MG tablet Take 60 mg by mouth every morning.     OVER THE COUNTER MEDICATION Take 2 capsules by mouth daily. Muscadine grape capsules - 650 mg each - take 2 capsules by mouth  daily     Pregnenolone Micronized (PREGNENOLONE PO) Take 75 mg by mouth daily.     Probiotic Product (PROBIOTIC PO) Take 1 capsule by mouth daily. 20 billion     QUERCETIN PO Take 1 capsule by mouth daily. Bio-Quercetin     sildenafil (REVATIO) 20 MG tablet take 2 to 5 tablets by mouth 1 hour prior to intercourse 90 tablet 1   testosterone cypionate (DEPOTESTOSTERONE CYPIONATE) 200 MG/ML injection Inject 0.5 mLs into the skin once a week. Once a week     traMADol (ULTRAM) 50 MG tablet Take 1 tablet (50 mg total) by mouth every 4 (four) hours as needed for moderate pain. 30 tablet 0   traZODone (DESYREL) 50 MG tablet Take 0.5-1 tablets (25-50 mg total) by mouth at bedtime as needed for sleep. 30 tablet 0   Turmeric 500 MG TABS Take 500 mg by mouth daily.     UNABLE TO FIND Take 1 drop by mouth daily as needed. Med Name: Cortexi for tinnitus     Zinc 50 MG TABS Take 50 mg by mouth daily.     No current facility-administered medications on file prior to visit.    There are no Patient Instructions on file for this visit. No follow-ups on file.   Georgiana Spinner, NP

## 2022-05-29 ENCOUNTER — Other Ambulatory Visit: Payer: Self-pay | Admitting: Physician Assistant

## 2022-05-29 ENCOUNTER — Telehealth: Payer: Self-pay | Admitting: Nurse Practitioner

## 2022-05-29 MED ORDER — DILTIAZEM HCL ER 120 MG PO CP12: 120.0000 mg | ORAL_CAPSULE | Freq: Two times a day (BID) | ORAL | 3 refills | Status: DC

## 2022-05-29 NOTE — Telephone Encounter (Signed)
Patient would like to know if he can have a prescription for rx   diltiazem (CARDIZEM SR) 120 MG 12 hr capsule sent in for him.He said that he was prescribed this medication after his surgery ,when he was discharged from the hospital,for his blood pressure.  [id number] [place], [place] - [address] AT [facility] [address] Phone: [phone]  Fax: [phone]

## 2022-05-29 NOTE — Telephone Encounter (Signed)
reFill provided

## 2022-06-11 DIAGNOSIS — J029 Acute pharyngitis, unspecified: Secondary | ICD-10-CM | POA: Diagnosis not present

## 2022-06-11 DIAGNOSIS — B349 Viral infection, unspecified: Secondary | ICD-10-CM | POA: Diagnosis not present

## 2022-06-23 ENCOUNTER — Telehealth (INDEPENDENT_AMBULATORY_CARE_PROVIDER_SITE_OTHER): Payer: Self-pay | Admitting: Vascular Surgery

## 2022-06-23 NOTE — Telephone Encounter (Signed)
LVM for pt asking for a return call regarding laser ablation. I have scheduled him for 7.12.24 which is the earliest date I have. I have called [employer] to extend the expiration date and should hear within 72 hours. Need to speak with pt regarding scheduling and details.

## 2022-06-24 ENCOUNTER — Ambulatory Visit: Payer: Medicare Other | Admitting: Thoracic Surgery (Cardiothoracic Vascular Surgery)

## 2022-06-27 ENCOUNTER — Other Ambulatory Visit: Payer: Self-pay | Admitting: Cardiovascular Disease

## 2022-06-27 ENCOUNTER — Ambulatory Visit
Admission: RE | Admit: 2022-06-27 | Discharge: 2022-06-27 | Disposition: A | Discharge status: Home / Self Care | Payer: Medicare Other | Source: Ambulatory Visit | Attending: Thoracic Surgery (Cardiothoracic Vascular Surgery) | Admitting: Thoracic Surgery (Cardiothoracic Vascular Surgery)

## 2022-06-27 DIAGNOSIS — J986 Disorders of diaphragm: Secondary | ICD-10-CM | POA: Diagnosis not present

## 2022-06-27 DIAGNOSIS — Z951 Presence of aortocoronary bypass graft: Secondary | ICD-10-CM

## 2022-07-01 ENCOUNTER — Ambulatory Visit: Payer: Medicare Other | Admitting: Thoracic Surgery (Cardiothoracic Vascular Surgery)

## 2022-07-01 ENCOUNTER — Encounter: Payer: Self-pay | Admitting: Thoracic Surgery (Cardiothoracic Vascular Surgery)

## 2022-07-01 VITALS — BP 146/78 | HR 72 | Resp 18 | Ht 67.0 in | Wt 225.0 lb

## 2022-07-01 DIAGNOSIS — J986 Disorders of diaphragm: Secondary | ICD-10-CM | POA: Diagnosis not present

## 2022-07-01 DIAGNOSIS — Z951 Presence of aortocoronary bypass graft: Secondary | ICD-10-CM

## 2022-07-01 NOTE — Progress Notes (Signed)
[address]       [place] [address]             [phone]     HPI: Keith Murillo returns for follow-up of his elevated right hemidiaphragm.  Keith Murillo is a 72 year old man with a history of CAD, MI, CABG, PCI, hypertension, hyperlipidemia, hypothyroidism, obesity, sleep apnea, and prostate cancer.  He presented with a non-STEMI around Thanksgiving.  Underwent coronary bypass grafting x 3 on 12/06/2021.  Postoperatively had an elevated right hemidiaphragm.  I last saw him in the office in April.  He was doing well from a cardiac standpoint but was still having some limitations with his breathing.  Recommended a follow-up with a sniff test.  In the interim since his last visit he has had some improvement with his respiratory status.  Overall feels well.  No recurrent chest pain.  Past Medical History:  Diagnosis Date   Cancer [facility])    prostate   Chest pain    Complication of anesthesia    fighting with waking up   Coronary artery disease    Heart attack [facility])    Hypercholesterolemia    Hyperlipidemia    Hypertension    Ischemic heart disease September 2004   Known--with non-Q-wave myocardial infarction    Obesity    Sleep apnea      Current Outpatient Medications  Medication Sig Dispense Refill   acetaminophen (TYLENOL) 500 MG tablet Take 2 tablets (1,000 mg total) by mouth every 6 (six) hours as needed. 30 tablet 0   aspirin EC 81 MG tablet Take 1 tablet (81 mg total) by mouth daily.     Cholecalciferol (VITAMIN D-3) 125 MCG (5000 UT) TABS Take 5,000 Units by mouth daily.      clopidogrel (PLAVIX) 75 MG tablet TAKE 1 TABLET BY MOUTH EVERY DAY 90 tablet 0   Coenzyme Q10 ([facility] COQ-10) 200 MG capsule Take 200 mg by mouth daily.     DHEA 25 MG CAPS Take 25 mg by mouth daily.      diltiazem (CARDIZEM SR) 120 MG 12 hr capsule Take 1 capsule (120 mg total) by mouth every 12 (twelve) hours. 60 capsule 3   ezetimibe (ZETIA) 10 MG tablet Take 1  tablet (10 mg total) by mouth daily. 30 tablet 3   Magnesium Citrate POWD Take 1 Scoop by mouth daily as needed (regularity).     melatonin 5 MG TABS Take 5 mg by mouth at bedtime.     Menaquinone-7 (VITAMIN K2 PO) Take 150 mcg by mouth daily. MK-7     NON FORMULARY Take 1 capsule by mouth daily. HAIOTB Circulation Syn3rgy (Beet Root, L-Arginine, Horse Chestnut)     NP THYROID 60 MG tablet Take 60 mg by mouth every morning.     OVER THE COUNTER MEDICATION Take 2 capsules by mouth daily. Muscadine grape capsules - 650 mg each - take 2 capsules by mouth daily     Pregnenolone Micronized (PREGNENOLONE PO) Take 75 mg by mouth daily.     Probiotic Product (PROBIOTIC PO) Take 1 capsule by mouth daily. 20 billion     QUERCETIN PO Take 1 capsule by mouth daily. Bio-Quercetin     sildenafil (REVATIO) 20 MG tablet take 2 to 5 tablets by mouth 1 hour prior to intercourse 90 tablet 1   testosterone cypionate (DEPOTESTOSTERONE CYPIONATE) 200 MG/ML injection Inject 0.5 mLs into the skin once a week. Once a week  traMADol (ULTRAM) 50 MG tablet Take 1 tablet (50 mg total) by mouth every 4 (four) hours as needed for moderate pain. 30 tablet 0   traZODone (DESYREL) 50 MG tablet Take 0.5-1 tablets (25-50 mg total) by mouth at bedtime as needed for sleep. 30 tablet 0   Turmeric 500 MG TABS Take 500 mg by mouth daily.     UNABLE TO FIND Take 1 drop by mouth daily as needed. Med Name: Cortexi for tinnitus     Zinc 50 MG TABS Take 50 mg by mouth daily.     No current facility-administered medications for this visit.    Physical Exam BP (!) 146/78   Pulse 72   Resp 18   Ht 5\' 7"  (1.702 m)   Wt 225 lb (102.1 kg)   SpO2 95% Comment: ra  BMI 35.54 kg/m  72 year old man in no acute distress Alert and oriented x 3 with no focal deficits Lungs very slightly diminished breath sounds at right base, almost equal Cardiac regular rate and rhythm  Diagnostic Tests: CHEST FLUOROSCOPY   TECHNIQUE: Real-time  fluoroscopic evaluation of the chest was performed.   FLUOROSCOPY: Radiation Exposure Index (as provided by the fluoroscopic device): 6.4 mGy reference air Kerma.   Fluoroscopy time: 20 seconds.   COMPARISON:  Chest radiograph 04/22/2022.   FINDINGS: Mild elevation of the right hemidiaphragm. Limited excursion of the right hemidiaphragm with inspiration, greater along the medial aspect. Slight paradoxical motion of the medial right hemidiaphragm.   IMPRESSION: Partial paresis of the right hemidiaphragm, greater along the medial aspect.     Electronically Signed   By: Orvan Falconer M.D.   On: 06/27/2022 10:18 I personally reviewed the sniff images.  There is slight paradoxical movement medially and likely anteriorly.  Impression: Keith Murillo is a 72 year old man with a history of CAD, MI, CABG, PCI, hypertension, hyperlipidemia, hypothyroidism, obesity, sleep apnea, and prostate cancer.   CAD-status post non-STEMI, status post CABG.  No recurrent angina.  Elevated right hemidiaphragm-there is movement of the diaphragm bilaterally likely posteriorly based on his chest x-ray but some paradoxical motion medially.  Chest x-ray is improved compared to the early preoperative films.  Symptomatically he is better so I do not think there is any indication for any surgical intervention at this point in time.  Plan: I will plan to see him back in about 6 months with a PA lateral chest x-ray He knows to call if he has any concerns in the meantime  Loreli Slot, MD [facility] [phone]

## 2022-07-18 ENCOUNTER — Other Ambulatory Visit (INDEPENDENT_AMBULATORY_CARE_PROVIDER_SITE_OTHER): Payer: Medicare Other | Admitting: Vascular Surgery

## 2022-08-12 ENCOUNTER — Ambulatory Visit (INDEPENDENT_AMBULATORY_CARE_PROVIDER_SITE_OTHER): Payer: Medicare Other

## 2022-08-12 ENCOUNTER — Other Ambulatory Visit (INDEPENDENT_AMBULATORY_CARE_PROVIDER_SITE_OTHER): Payer: Medicare Other | Admitting: Vascular Surgery

## 2022-08-12 VITALS — BP 146/78 | Ht 67.0 in | Wt 225.0 lb

## 2022-08-12 DIAGNOSIS — Z0000 Encounter for general adult medical examination without abnormal findings: Secondary | ICD-10-CM

## 2022-08-12 DIAGNOSIS — Z5320 Procedure and treatment not carried out because of patient's decision for unspecified reasons: Secondary | ICD-10-CM

## 2022-08-12 NOTE — Progress Notes (Signed)
Because this visit was a virtual/telehealth visit,  certain criteria was not obtained, such a blood pressure, CBG if patient is a diabetic, and timed up and go. Any medications not marked as "taking" was not mentioned during the medication reconciliation part of the visit. Any vitals not documented were not able to be obtained due to this being a telehealth visit. Vitals documented are verbally provided by the patient.   Per patient no change in vitals since last visit, unable to obtain new vitals due to telehealth visit  Subjective:   Keith Murillo is a 72 y.o. male who presents for Medicare Annual/Subsequent preventive examination.  Visit Complete: Virtual  I connected with  Keith Murillo on 08/12/22 by a audio enabled telemedicine application and verified that I am speaking with the correct person using two identifiers.  Patient Location: Home  Provider Location: Home Office  I discussed the limitations of evaluation and management by telemedicine. The patient expressed understanding and agreed to proceed.  Patient Medicare AWV questionnaire was completed by the patient on n/a; I have confirmed that all information answered by patient is correct and no changes since this date.  Review of Systems     Cardiac Risk Factors include: advanced age (>96men, >22 women);dyslipidemia;hypertension;obesity (BMI >30kg/m2);male gender     Objective:    Today's Vitals   08/12/22 0843  BP: (!) 146/78  Weight: 225 lb (102.1 kg)  Height: 5\' 7"  (1.702 m)   Body mass index is 35.24 kg/m.     08/12/2022    8:45 AM 12/03/2021    4:05 PM 11/27/2021   11:00 PM 08/03/2021    2:04 PM 01/04/2021    1:27 PM 09/19/2019   10:48 AM 03/16/2018   10:44 AM  Advanced Directives  Does Patient Have a Medical Advance Directive? No No No No No No No  Would patient like information on creating a medical advance directive? No - Patient declined Yes (Inpatient - patient defers creating a medical  advance directive at this time - Information given) Yes (Inpatient - patient requests chaplain consult to create a medical advance directive) No - Patient declined   No - Patient declined    Current Medications (verified) Outpatient Encounter Medications as of 08/12/2022  Medication Sig   acetaminophen (TYLENOL) 500 MG tablet Take 2 tablets (1,000 mg total) by mouth every 6 (six) hours as needed.   aspirin EC 81 MG tablet Take 1 tablet (81 mg total) by mouth daily.   Cholecalciferol (VITAMIN D-3) 125 MCG (5000 UT) TABS Take 5,000 Units by mouth daily.    clopidogrel (PLAVIX) 75 MG tablet TAKE 1 TABLET BY MOUTH EVERY DAY   Coenzyme Q10 ([employer] COQ-10) 200 MG capsule Take 200 mg by mouth daily.   DHEA 25 MG CAPS Take 25 mg by mouth daily.    diltiazem (CARDIZEM SR) 120 MG 12 hr capsule Take 1 capsule (120 mg total) by mouth every 12 (twelve) hours.   ezetimibe (ZETIA) 10 MG tablet Take 1 tablet (10 mg total) by mouth daily.   Magnesium Citrate POWD Take 1 Scoop by mouth daily as needed (regularity).   melatonin 5 MG TABS Take 5 mg by mouth at bedtime.   Menaquinone-7 (VITAMIN K2 PO) Take 150 mcg by mouth daily. MK-7   NON FORMULARY Take 1 capsule by mouth daily. HAIOTB Circulation Syn3rgy (Beet Root, L-Arginine, Horse Chestnut)   NP THYROID 60 MG tablet Take 60 mg by mouth every morning.   OVER THE COUNTER  MEDICATION Take 2 capsules by mouth daily. [place]cadine grape capsules - 650 mg each - take 2 capsules by mouth daily   Pregnenolone Micronized (PREGNENOLONE PO) Take 75 mg by mouth daily.   Probiotic Product (PROBIOTIC PO) Take 1 capsule by mouth daily. 20 billion   QUERCETIN PO Take 1 capsule by mouth daily. Bio-Quercetin   sildenafil (REVATIO) 20 MG tablet take 2 to 5 tablets by mouth 1 hour prior to intercourse   testosterone cypionate (DEPOTESTOSTERONE CYPIONATE) 200 MG/ML injection Inject 0.5 mLs into the skin once a week. Once a week   traMADol (ULTRAM) 50 MG tablet Take 1 tablet (50 mg  total) by mouth every 4 (four) hours as needed for moderate pain.   traZODone (DESYREL) 50 MG tablet Take 0.5-1 tablets (25-50 mg total) by mouth at bedtime as needed for sleep.   Turmeric 500 MG TABS Take 500 mg by mouth daily.   UNABLE TO FIND Take 1 drop by mouth daily as needed. Med Name: Cortexi for tinnitus   Zinc 50 MG TABS Take 50 mg by mouth daily.   No facility-administered encounter medications on file as of 08/12/2022.    Allergies (verified) Ace inhibitors, Beta adrenergic blockers, Codeine, Lisinopril, and Statins   History: Past Medical History:  Diagnosis Date   Cancer [facility])    prostate   Chest pain    Complication of anesthesia    fighting with waking up   Coronary artery disease    Heart attack (HCC)    Hypercholesterolemia    Hyperlipidemia    Hypertension    Ischemic heart disease September 2004   Known--with non-Q-wave myocardial infarction    Obesity    Sleep apnea    Past Surgical History:  Procedure Laterality Date   CARDIAC CATHETERIZATION  11/2007   CORONARY ARTERY BYPASS GRAFT N/A 12/06/2021   Procedure: CORONARY ARTERY BYPASS GRAFTING (CABG) X 3 BYPASSES USING LEFT INTERNAL MAMMARY ARTERY AND RIGHT LEG GREATER SAPHENOUS VEIN HARVESTED ENDOSCOPICALLY;  Surgeon: Loreli Slot, MD;  Location: [facility] OR;  Service: Open Heart Surgery;  Laterality: N/A;   INGUINAL HERNIA REPAIR Right 09/22/2019   Procedure: RIGHT INGUINAL HERNIA WITH MESH;  Surgeon: Harriette Bouillon, MD;  Location: [facility] OR;  Service: General;  Laterality: Right;  TAP BLOCK   LEFT HEART CATH AND CORONARY ANGIOGRAPHY N/A 11/29/2021   Procedure: LEFT HEART CATH AND CORONARY ANGIOGRAPHY;  Surgeon: Marykay Lex, MD;  Location: [facility] INVASIVE CV LAB;  Service: Cardiovascular;  Laterality: N/A;   TEE WITHOUT CARDIOVERSION N/A 03/16/2018   Procedure: TRANSESOPHAGEAL ECHOCARDIOGRAM (TEE);  Surgeon: Elease Hashimoto Deloris Ping, MD;  Location: [facility] ENDOSCOPY;  Service: Cardiovascular;  Laterality: N/A;   TEE  WITHOUT CARDIOVERSION N/A 12/06/2021   Procedure: TRANSESOPHAGEAL ECHOCARDIOGRAM (TEE);  Surgeon: Loreli Slot, MD;  Location: [facility] OR;  Service: Open Heart Surgery;  Laterality: N/A;   Family History  Problem Relation Age of Onset   Cancer Mother        larynx   Hearing loss Father    Cancer Maternal Grandmother    Parkinson's disease Maternal Grandfather    Stroke Maternal Grandfather    Stroke Paternal Grandmother    Heart attack Paternal Grandfather    Social History   Socioeconomic History   Marital status: Married    Spouse name: [name]   Number of children: 3   Years of education: 16   Highest education level: Bachelor's degree (e.g., BA, AB, BS)  Occupational History   Not on file  Tobacco  Use   Smoking status: Never   Smokeless tobacco: Never  Vaping Use   Vaping status: Never Used  Substance and Sexual Activity   Alcohol use: Yes    Comment: occas   Drug use: No   Sexual activity: Yes  Other Topics Concern   Not on file  Social History Narrative   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   Social Determinants of Corporate investment banker Strain: Low Risk  (08/12/2022)   Overall Financial Resource Strain (CARDIA)    Difficulty of Paying Living Expenses: Not hard at all  Food Insecurity: No Food Insecurity (08/12/2022)   Hunger Vital Sign    Worried About Running Out of Food in the Last Year: Never true    Ran Out of Food in the Last Year: Never true  Transportation Needs: No Transportation Needs (08/12/2022)   PRAPARE - Administrator, Civil Service (Medical): No    Lack of Transportation (Non-Medical): No  Physical Activity: Sufficiently Active (08/12/2022)   Exercise Vital Sign    Days of Exercise per Week: 7 days    Minutes of Exercise per Session: 30 min  Stress: No Stress Concern Present (08/12/2022)   [employer] of Occupational Health - Occupational Stress Questionnaire    Feeling of Stress : Not at all  Social Connections:  Moderately Integrated (08/12/2022)   Social Connection and Isolation Panel [NHANES]    Frequency of Communication with Friends and Family: More than three times a week    Frequency of Social Gatherings with Friends and Family: More than three times a week    Attends Religious Services: More than 4 times per year    Active Member of [employer] or Organizations: No    Attends Engineer, structural: Never    Marital Status: Married    Tobacco Counseling Counseling given: Yes   Clinical Intake:  Pre-visit preparation completed: Yes  Pain : No/denies pain     BMI - recorded: 35.24 Nutritional Status: BMI > 30  Obese Nutritional Risks: None Diabetes: No  How often do you need to have someone help you when you read instructions, pamphlets, or other written materials from your doctor or pharmacy?: 1 - Never  Interpreter Needed?: No  Information entered by :: Abby , CMA   Activities of Daily Living    08/12/2022    8:44 AM 11/27/2021   11:00 PM  In your present state of health, do you have any difficulty performing the following activities:  Hearing? 0 1  Comment has tinnitus and wears hearing aids   Vision? 0 0  Difficulty concentrating or making decisions? 0 0  Walking or climbing stairs? 0 1  Dressing or bathing? 0 0  Doing errands, shopping? 0 0  Preparing Food and eating ? N   Using the Toilet? N   In the past six months, have you accidently leaked urine? N   Do you have problems with loss of bowel control? N   Managing your Medications? N   Managing your Finances? N   Housekeeping or managing your Housekeeping? N     Patient Care Team: Eden Emms, NP as PCP - General (Nurse Practitioner) Antonieta Iba, MD as PCP - Cardiology (Cardiology)  Indicate any recent Medical Services you may have received from other than [facility] providers in the past year (date may be approximate).     Assessment:   This is a routine wellness examination for  Antwyne.  Hearing/Vision screen  Hearing Screening - Comments:: Patient states he wears hearing aids and has tinnitus.  Dietary issues and exercise activities discussed:     Goals Addressed             This Visit's Progress    Patient Stated       "Get my weight under control"       Depression Screen    08/12/2022    8:48 AM 04/25/2022   11:47 AM 03/27/2022    1:58 PM 02/13/2022    2:59 PM 12/26/2021    8:45 AM 01/30/2017    8:24 AM 10/31/2016    8:24 AM  PHQ 2/9 Scores  PHQ - 2 Score 0 0 0 0 0 0 0  PHQ- 9 Score  1 0 1       Fall Risk    08/12/2022    8:46 AM 04/25/2022   10:49 AM 04/23/2022   10:00 AM 04/18/2022   10:48 AM 04/16/2022   10:43 AM  Fall Risk   Falls in the past year? 0 0 0 0 0  Number falls in past yr: 0 0 0 0 0  Injury with Fall? 0 0 0 0 0  Risk for fall due to : No Fall Risks No Fall Risks No Fall Risks No Fall Risks No Fall Risks  Follow up Falls prevention discussed Falls evaluation completed Falls evaluation completed;Education provided;Falls prevention discussed Falls evaluation completed Falls evaluation completed    MEDICARE RISK AT HOME:  Medicare Risk at Home - 08/12/22 0846     Any stairs in or around the home? No    If so, are there any without handrails? No    Home free of loose throw rugs in walkways, pet beds, electrical cords, etc? Yes    Adequate lighting in your home to reduce risk of falls? Yes    Life alert? No    Use of a cane, walker or w/c? No    Grab bars in the bathroom? No    Shower chair or bench in shower? Yes    Elevated toilet seat or a handicapped toilet? No             TIMED UP AND GO:  Was the test performed?  No    Cognitive Function:        08/12/2022    8:46 AM  6CIT Screen  What Year? 0 points  What month? 0 points  What time? 0 points  Count back from 20 0 points  Months in reverse 0 points  Repeat phrase 0 points  Total Score 0 points    Immunizations There is no immunization history for  the selected administration types on file for this patient.  TDAP status: Due, Education has been provided regarding the importance of this vaccine. Advised may receive this vaccine at local pharmacy or Health Dept. Aware to provide a copy of the vaccination record if obtained from local pharmacy or Health Dept. Verbalized acceptance and understanding.  Flu Vaccine status: Due, Education has been provided regarding the importance of this vaccine. Advised may receive this vaccine at local pharmacy or Health Dept. Aware to provide a copy of the vaccination record if obtained from local pharmacy or Health Dept. Verbalized acceptance and understanding.  Pneumococcal vaccine status: Due, Education has been provided regarding the importance of this vaccine. Advised may receive this vaccine at local pharmacy or Health Dept. Aware to provide a copy of the vaccination record if obtained from local pharmacy or  Health Dept. Verbalized acceptance and understanding.  Covid-19 vaccine status: Declined, Education has been provided regarding the importance of this vaccine but patient still declined. Advised may receive this vaccine at local pharmacy or Health Dept.or vaccine clinic. Aware to provide a copy of the vaccination record if obtained from local pharmacy or Health Dept. Verbalized acceptance and understanding.  Qualifies for Shingles Vaccine? Yes   Zostavax completed No   Shingrix Completed?: No.    Education has been provided regarding the importance of this vaccine. Patient has been advised to call insurance company to determine out of pocket expense if they have not yet received this vaccine. Advised may also receive vaccine at local pharmacy or Health Dept. Verbalized acceptance and understanding.  Screening Tests Health Maintenance  Topic Date Due   Hepatitis C Screening  Never done   DTaP/Tdap/Td (1 - Tdap) Never done   Zoster Vaccines- Shingrix (1 of 2) Never done   Pneumonia Vaccine 65+ Years  old (1 of 1 - PCV) Never done   Colonoscopy  10/18/2016   Medicare Annual Wellness (AWV)  01/30/2018   INFLUENZA VACCINE  08/07/2022   HPV VACCINES  Aged Out   COVID-19 Vaccine  Discontinued    Health Maintenance  Health Maintenance Due  Topic Date Due   Hepatitis C Screening  Never done   DTaP/Tdap/Td (1 - Tdap) Never done   Zoster Vaccines- Shingrix (1 of 2) Never done   Pneumonia Vaccine 13+ Years old (1 of 1 - PCV) Never done   Colonoscopy  10/18/2016   Medicare Annual Wellness (AWV)  01/30/2018   INFLUENZA VACCINE  08/07/2022    Colorectal cancer screening: patient declined referral to GI  Lung Cancer Screening: (Low Dose CT Chest recommended if Age 70-80 years, 20 pack-year currently smoking OR have quit w/in 15years.) does not qualify.    Additional Screening:  Hepatitis C Screening: does qualify; Ordered 08/12/2022  Vision Screening: Recommended annual ophthalmology exams for early detection of glaucoma and other disorders of the eye. Is the patient up to date with their annual eye exam?  Yes  Who is the provider or what is the name of the office in which the patient attends annual eye exams? Dr. Senaida Ores If pt is not established with a provider, would they like to be referred to a provider to establish care? No .   Dental Screening: Recommended annual dental exams for proper oral hygiene  Diabetic Foot Exam: n/a  Community Resource Referral / Chronic Care Management: CRR required this visit?  No   CCM required this visit?  No     Plan:     I have personally reviewed and noted the following in the patient's chart:   Medical and social history Use of alcohol, tobacco or illicit drugs  Current medications and supplements including opioid prescriptions. Patient is not currently taking opioid prescriptions. Functional ability and status Nutritional status Physical activity Advanced directives List of other physicians Hospitalizations, surgeries, and ER  visits in previous 12 months Vitals Screenings to include cognitive, depression, and falls Referrals and appointments  In addition, I have reviewed and discussed with patient certain preventive protocols, quality metrics, and best practice recommendations. A written personalized care plan for preventive services as well as general preventive health recommendations were provided to patient.     Jordan Hawks , CMA   08/12/2022   After Visit Summary: (MyChart) Due to this being a telephonic visit, the after visit summary with patients personalized plan was offered to  patient via MyChart   Nurse Notes:

## 2022-08-12 NOTE — Patient Instructions (Signed)
Keith Murillo , Thank you for taking time to come for your Medicare Wellness Visit. I appreciate your ongoing commitment to your health goals. Please review the following plan we discussed and let me know if I can assist you in the future.   These are the goals we discussed:  Goals      Patient Stated     "Get my weight under control"        This is a list of the screening recommended for you and due dates:  Health Maintenance  Topic Date Due   Hepatitis C Screening  Never done   DTaP/Tdap/Td vaccine (1 - Tdap) Never done   Zoster (Shingles) Vaccine (1 of 2) Never done   Pneumonia Vaccine (1 of 1 - PCV) Never done   Colon Cancer Screening  10/18/2016   Flu Shot  08/07/2022   Medicare Annual Wellness Visit  08/12/2023   HPV Vaccine  Aged Out   COVID-19 Vaccine  Discontinued    Advanced directives: Advance directive discussed with you today. Even though you declined this today, please call our office should you change your mind, and we can give you the proper paperwork for you to fill out. Advance care planning is a way to make decisions about medical care that fits your values in case you are ever unable to make these decisions for yourself.  Information on Advanced Care Planning can be found at [facility] Advance Health Care Directives Advance Health Care Directives ([email or web address]    Conditions/risks identified:  You are due for the vaccines checked below. You may have these done at your preferred pharmacy. Please have them fax the office proof of the vaccines so that we can update your chart.   [x]  Flu (due annually) [x]  Shingrix (Shingles vaccine) [x]  Pneumonia Vaccines [x]  TDAP (Tetanus) Vaccine every 10 years []  Covid-19    Next appointment: VIRTUAL/ TELEPHONE VISIT Follow up in one year for your annual wellness visit  August 17, 2023 at 8:45 am telephone visit   Preventive Care 22 Years and Older, Male  Preventive care refers to lifestyle  choices and visits with your health care provider that can promote health and wellness. What does preventive care include? A yearly physical exam. This is also called an annual well check. Dental exams once or twice a year. Routine eye exams. Ask your health care provider how often you should have your eyes checked. Personal lifestyle choices, including: Daily care of your teeth and gums. Regular physical activity. Eating a healthy diet. Avoiding tobacco and drug use. Limiting alcohol use. Practicing safe sex. Taking low doses of aspirin every day. Taking vitamin and mineral supplements as recommended by your health care provider. What happens during an annual well check? The services and screenings done by your health care provider during your annual well check will depend on your age, overall health, lifestyle risk factors, and family history of disease. Counseling  Your health care provider may ask you questions about your: Alcohol use. Tobacco use. Drug use. Emotional well-being. Home and relationship well-being. Sexual activity. Eating habits. History of falls. Memory and ability to understand (cognition). Work and work Astronomer. Screening  You may have the following tests or measurements: Height, weight, and BMI. Blood pressure. Lipid and cholesterol levels. These may be checked every 5 years, or more frequently if you are over 50 years old. Skin check. Lung cancer screening. You may have this screening every year starting at age 42 if you have  a 30-pack-year history of smoking and currently smoke or have quit within the past 15 years. Fecal occult blood test (FOBT) of the stool. You may have this test every year starting at age 29. Flexible sigmoidoscopy or colonoscopy. You may have a sigmoidoscopy every 5 years or a colonoscopy every 10 years starting at age 34. Prostate cancer screening. Recommendations will vary depending on your family history and other  risks. Hepatitis C blood test. Hepatitis B blood test. Sexually transmitted disease (STD) testing. Diabetes screening. This is done by checking your blood sugar (glucose) after you have not eaten for a while (fasting). You may have this done every 1-3 years. Abdominal aortic aneurysm (AAA) screening. You may need this if you are a current or former smoker. Osteoporosis. You may be screened starting at age 68 if you are at high risk. Talk with your health care provider about your test results, treatment options, and if necessary, the need for more tests. Vaccines  Your health care provider may recommend certain vaccines, such as: Influenza vaccine. This is recommended every year. Tetanus, diphtheria, and acellular pertussis (Tdap, Td) vaccine. You may need a Td booster every 10 years. Zoster vaccine. You may need this after age 57. Pneumococcal 13-valent conjugate (PCV13) vaccine. One dose is recommended after age 110. Pneumococcal polysaccharide (PPSV23) vaccine. One dose is recommended after age 38. Talk to your health care provider about which screenings and vaccines you need and how often you need them. This information is not intended to replace advice given to you by your health care provider. Make sure you discuss any questions you have with your health care provider. Document Released: 01/19/2015 Document Revised: 09/12/2015 Document Reviewed: 10/24/2014 Elsevier Interactive Patient Education  2017 [employer].  Fall Prevention in the Home Falls can cause injuries. They can happen to people of all ages. There are many things you can do to make your home safe and to help prevent falls. What can I do on the outside of my home? Regularly fix the edges of walkways and driveways and fix any cracks. Remove anything that might make you trip as you walk through a door, such as a raised step or threshold. Trim any bushes or trees on the path to your home. Use bright outdoor lighting. Clear  any walking paths of anything that might make someone trip, such as rocks or tools. Regularly check to see if handrails are loose or broken. Make sure that both sides of any steps have handrails. Any raised decks and porches should have guardrails on the edges. Have any leaves, snow, or ice cleared regularly. Use sand or salt on walking paths during winter. Clean up any spills in your garage right away. This includes oil or grease spills. What can I do in the bathroom? Use night lights. Install grab bars by the toilet and in the tub and shower. Do not use towel bars as grab bars. Use non-skid mats or decals in the tub or shower. If you need to sit down in the shower, use a plastic, non-slip stool. Keep the floor dry. Clean up any water that spills on the floor as soon as it happens. Remove soap buildup in the tub or shower regularly. Attach bath mats securely with double-sided non-slip rug tape. Do not have throw rugs and other things on the floor that can make you trip. What can I do in the bedroom? Use night lights. Make sure that you have a light by your bed that is easy to  reach. Do not use any sheets or blankets that are too big for your bed. They should not hang down onto the floor. Have a firm chair that has side arms. You can use this for support while you get dressed. Do not have throw rugs and other things on the floor that can make you trip. What can I do in the kitchen? Clean up any spills right away. Avoid walking on wet floors. Keep items that you use a lot in easy-to-reach places. If you need to reach something above you, use a strong step stool that has a grab bar. Keep electrical cords out of the way. Do not use floor polish or wax that makes floors slippery. If you must use wax, use non-skid floor wax. Do not have throw rugs and other things on the floor that can make you trip. What can I do with my stairs? Do not leave any items on the stairs. Make sure that there  are handrails on both sides of the stairs and use them. Fix handrails that are broken or loose. Make sure that handrails are as long as the stairways. Check any carpeting to make sure that it is firmly attached to the stairs. Fix any carpet that is loose or worn. Avoid having throw rugs at the top or bottom of the stairs. If you do have throw rugs, attach them to the floor with carpet tape. Make sure that you have a light switch at the top of the stairs and the bottom of the stairs. If you do not have them, ask someone to add them for you. What else can I do to help prevent falls? Wear shoes that: Do not have high heels. Have rubber bottoms. Are comfortable and fit you well. Are closed at the toe. Do not wear sandals. If you use a stepladder: Make sure that it is fully opened. Do not climb a closed stepladder. Make sure that both sides of the stepladder are locked into place. Ask someone to hold it for you, if possible. Clearly mark and make sure that you can see: Any grab bars or handrails. First and last steps. Where the edge of each step is. Use tools that help you move around (mobility aids) if they are needed. These include: Canes. Walkers. Scooters. Crutches. Turn on the lights when you go into a dark area. Replace any light bulbs as soon as they burn out. Set up your furniture so you have a clear path. Avoid moving your furniture around. If any of your floors are uneven, fix them. If there are any pets around you, be aware of where they are. Review your medicines with your doctor. Some medicines can make you feel dizzy. This can increase your chance of falling. Ask your doctor what other things that you can do to help prevent falls. This information is not intended to replace advice given to you by your health care provider. Make sure you discuss any questions you have with your health care provider. Document Released: 10/19/2008 Document Revised: 05/31/2015 Document Reviewed:  01/27/2014 Elsevier Interactive Patient Education  2017 [employer].

## 2022-08-15 ENCOUNTER — Other Ambulatory Visit (INDEPENDENT_AMBULATORY_CARE_PROVIDER_SITE_OTHER): Payer: Medicare Other | Admitting: Vascular Surgery

## 2022-08-21 ENCOUNTER — Encounter (INDEPENDENT_AMBULATORY_CARE_PROVIDER_SITE_OTHER): Payer: Self-pay

## 2022-08-22 ENCOUNTER — Encounter (INDEPENDENT_AMBULATORY_CARE_PROVIDER_SITE_OTHER): Payer: Medicare Other

## 2022-09-12 ENCOUNTER — Ambulatory Visit (INDEPENDENT_AMBULATORY_CARE_PROVIDER_SITE_OTHER): Payer: Medicare Other | Admitting: Nurse Practitioner

## 2022-09-22 ENCOUNTER — Other Ambulatory Visit: Payer: Self-pay | Admitting: Nurse Practitioner

## 2022-09-23 ENCOUNTER — Other Ambulatory Visit: Payer: Self-pay | Admitting: Cardiovascular Disease

## 2022-09-23 NOTE — Telephone Encounter (Signed)
Pt scheduled on 1/11

## 2022-09-23 NOTE — Telephone Encounter (Signed)
Good Morning,  Could you please schedule this patient a 6 month follow up visit? The patient was last seen by Dr. Mariah Milling on 02-25-22. Thank you so much.

## 2022-10-02 ENCOUNTER — Ambulatory Visit: Payer: Medicare Other | Admitting: Nurse Practitioner

## 2022-10-02 ENCOUNTER — Encounter: Payer: Self-pay | Admitting: Nurse Practitioner

## 2022-10-02 ENCOUNTER — Telehealth: Payer: Self-pay

## 2022-10-02 VITALS — BP 124/78 | HR 72 | Temp 98.2°F | Ht 67.0 in | Wt 230.2 lb

## 2022-10-02 DIAGNOSIS — C61 Malignant neoplasm of prostate: Secondary | ICD-10-CM | POA: Diagnosis not present

## 2022-10-02 DIAGNOSIS — E291 Testicular hypofunction: Secondary | ICD-10-CM

## 2022-10-02 DIAGNOSIS — E785 Hyperlipidemia, unspecified: Secondary | ICD-10-CM | POA: Diagnosis not present

## 2022-10-02 DIAGNOSIS — Z1211 Encounter for screening for malignant neoplasm of colon: Secondary | ICD-10-CM

## 2022-10-02 DIAGNOSIS — R7303 Prediabetes: Secondary | ICD-10-CM | POA: Diagnosis not present

## 2022-10-02 DIAGNOSIS — I2510 Atherosclerotic heart disease of native coronary artery without angina pectoris: Secondary | ICD-10-CM

## 2022-10-02 DIAGNOSIS — I83893 Varicose veins of bilateral lower extremities with other complications: Secondary | ICD-10-CM

## 2022-10-02 DIAGNOSIS — Z1159 Encounter for screening for other viral diseases: Secondary | ICD-10-CM

## 2022-10-02 DIAGNOSIS — Z0000 Encounter for general adult medical examination without abnormal findings: Secondary | ICD-10-CM | POA: Insufficient documentation

## 2022-10-02 DIAGNOSIS — N5201 Erectile dysfunction due to arterial insufficiency: Secondary | ICD-10-CM

## 2022-10-02 HISTORY — DX: Encounter for general adult medical examination without abnormal findings: Z00.00

## 2022-10-02 LAB — COMPREHENSIVE METABOLIC PANEL
ALT: 34 U/L (ref 0–53)
AST: 25 U/L (ref 0–37)
Albumin: 4 g/dL (ref 3.5–5.2)
Alkaline Phosphatase: 60 U/L (ref 39–117)
BUN: 12 mg/dL (ref 6–23)
CO2: 28 mEq/L (ref 19–32)
Calcium: 9.1 mg/dL (ref 8.4–10.5)
Chloride: 102 mEq/L (ref 96–112)
Creatinine, Ser: 0.94 mg/dL (ref 0.40–1.50)
GFR: 81.78 mL/min (ref >60.00)
Glucose, Bld: 96 mg/dL (ref 70–99)
Potassium: 4.1 mEq/L (ref 3.5–5.1)
Sodium: 138 mEq/L (ref 135–145)
Total Bilirubin: 0.8 mg/dL (ref 0.2–1.2)
Total Protein: 6.3 g/dL (ref 6.0–8.3)

## 2022-10-02 LAB — LIPID PANEL
Cholesterol: 195 mg/dL (ref 0–200)
HDL: 39.9 mg/dL (ref >39.00)
LDL Cholesterol: 116 mg/dL — ABNORMAL HIGH (ref 0–99)
NonHDL: 154.87
Total CHOL/HDL Ratio: 5
Triglycerides: 193 mg/dL — ABNORMAL HIGH (ref 0.0–149.0)
VLDL: 38.6 mg/dL (ref 0.0–40.0)

## 2022-10-02 LAB — CBC
HCT: 53.7 % — ABNORMAL HIGH (ref 39.0–52.0)
Hemoglobin: 18 g/dL (ref 13.0–17.0)
MCHC: 33.4 g/dL (ref 30.0–36.0)
MCV: 88.6 fl (ref 78.0–100.0)
Platelets: 303 10*3/uL (ref 150.0–400.0)
RBC: 6.07 Mil/uL — ABNORMAL HIGH (ref 4.22–5.81)
RDW: 15 % (ref 11.5–15.5)
WBC: 8.2 10*3/uL (ref 4.0–10.5)

## 2022-10-02 LAB — PSA, MEDICARE: PSA: 8.36 ng/ml — ABNORMAL HIGH (ref 0.10–4.00)

## 2022-10-02 LAB — TSH: TSH: 1.76 u[IU]/mL (ref 0.35–5.50)

## 2022-10-02 LAB — HEMOGLOBIN A1C: Hgb A1c MFr Bld: 5.6 % (ref 4.6–6.5)

## 2022-10-02 NOTE — Assessment & Plan Note (Signed)
History of same.  Patient has gained some weight pending A1c

## 2022-10-02 NOTE — Patient Instructions (Signed)
Nice to see you today I will be in touch with the labs once I have reviewed them Follow up with me in 1 year, sooner if you  need me

## 2022-10-02 NOTE — Assessment & Plan Note (Signed)
History of the same status post CABG.  On ezetimibe.  Continue pending lipid panel

## 2022-10-02 NOTE — Assessment & Plan Note (Signed)
Discussed age-appropriate immunizations and screening exams.  Did review patient's personal, surgical, social, family histories.  Patient refused all vaccinations.  Patient is overdue for CRC screening.  Ambulatory furl to gastroenterology placed today.  Did do PSA for prostate cancer screening.  Patient was given information at discharge about preventative healthcare maintenance with anticipatory guidance.

## 2022-10-02 NOTE — Telephone Encounter (Signed)
CRITICAL VALUE STICKER  CRITICAL VALUE: hgb 18  RECEIVER (on-site recipient of call):Keith Murillo  DATE & TIME NOTIFIED:  2:22 PM    MESSENGER (representative from lab): Henderson Cloud   MD NOTIFIED:  Red book filled out as well as hight priority notes sent   TIME OF NOTIFICATION: 2:23 PM   RESPONSE: will await answer from [facility] after review.

## 2022-10-02 NOTE — Assessment & Plan Note (Signed)
Patient currently maintained on tadalafil 20 mg as written through [facility] medicine [facility]

## 2022-10-02 NOTE — Assessment & Plan Note (Signed)
Patient currently on testosterone injections written through integrative medicine clinic

## 2022-10-02 NOTE — Assessment & Plan Note (Signed)
Status post CABG.  He is followed by cardiology and cardiothoracic surgery.  He is on ezetimibe for cholesterol continue medications prescribed continue following with specialist as recommended

## 2022-10-02 NOTE — Progress Notes (Signed)
Established Patient Office Visit  Subjective   Patient ID: Keith Murillo, male    DOB: Jun 16, 1950  Age: 72 y.o. MRN: [id number]  Chief Complaint  Patient presents with   Annual Exam    HPI  ED: tadalafil, testosterone this is managed by Tomma Rakers  Prstate Cancer: hx of the same. Was followed by urology overdue for follow-up  Dr. Carollee Leitz at [facility]  HLD: zetia. Cannot tolerate statins.  NSTEMI/CAD: Dr Mariah Milling and Dr. Dorris Fetch (CT surgeon). Diltiazem, zetia, pavix.  OSA: states that he is doing the CPAP since he has been gaining   for complete physical and follow up of chronic conditions.  Immunizations: -Tetanus: Refused -Influenza: Refused -Shingles: Refused -Pneumonia: Refused  Diet: Fair diet. States that he eats approx 3 meals a day with some snacking. States that coffee and water primarly. Juice on ocassion  Exercise: No regular exercise. Yard work around [employer] exam: within a year  Dental exam: Needs updating    Colonoscopy: Completed in 2008 would like to see Dr. Loreta Ave Lung Cancer Screening: N/A  PSA: Due       Review of Systems  Constitutional:  Negative for chills and fever.  Respiratory:  Negative for shortness of breath.   Cardiovascular:  Negative for chest pain and leg swelling.  Gastrointestinal:  Negative for abdominal pain, blood in stool, constipation, diarrhea, nausea and vomiting.       Bm daily   Genitourinary:  Negative for dysuria and hematuria.       Nocturia x1  Neurological:  Negative for tingling and headaches.  Psychiatric/Behavioral:  Negative for hallucinations and suicidal ideas.       Objective:     BP 124/78   Pulse 72   Temp 98.2 F (36.8 C) (Temporal)   Ht 5\' 7"  (1.702 m)   Wt 230 lb 3.2 oz (104.4 kg)   SpO2 94%   BMI 36.05 kg/m  BP Readings from Last 3 Encounters:  10/02/22 124/78  08/12/22 (!) 146/78  07/01/22 (!) 146/78   Wt Readings from Last 3 Encounters:   10/02/22 230 lb 3.2 oz (104.4 kg)  08/12/22 225 lb (102.1 kg)  07/01/22 225 lb (102.1 kg)   SpO2 Readings from Last 3 Encounters:  10/02/22 94%  07/01/22 95%  04/22/22 97%      Physical Exam Vitals and nursing note reviewed.  Constitutional:      Appearance: Normal appearance.  HENT:     Right Ear: Tympanic membrane, ear canal and external ear normal.     Left Ear: Tympanic membrane, ear canal and external ear normal.     Mouth/Throat:     Mouth: Mucous membranes are moist.     Pharynx: Oropharynx is clear.  Eyes:     Extraocular Movements: Extraocular movements intact.     Pupils: Pupils are equal, round, and reactive to light.  Cardiovascular:     Rate and Rhythm: Normal rate and regular rhythm.     Pulses: Normal pulses.     Heart sounds: Normal heart sounds.  Pulmonary:     Effort: Pulmonary effort is normal.     Breath sounds: Normal breath sounds.  Abdominal:     General: Bowel sounds are normal. There is no distension.     Palpations: There is no mass.     Tenderness: There is no abdominal tenderness.     Hernia: No hernia is present.  Musculoskeletal:     Right lower leg: No  edema.     Left lower leg: No edema.  Lymphadenopathy:     Cervical: No cervical adenopathy.  Skin:    General: Skin is warm.  Neurological:     General: No focal deficit present.     Mental Status: He is alert.     Deep Tendon Reflexes:     Reflex Scores:      Bicep reflexes are 2+ on the right side and 2+ on the left side.      Patellar reflexes are 2+ on the right side and 2+ on the left side.    Comments: Bilateral upper and lower extremity strength 5/5  Psychiatric:        Mood and Affect: Mood normal.        Behavior: Behavior normal.        Thought Content: Thought content normal.        Judgment: Judgment normal.      No results found for any visits on 10/02/22.    The ASCVD Risk score (Arnett DK, et al., 2019) failed to calculate for the following reasons:   The  patient has a prior MI or stroke diagnosis    Assessment & Plan:   Problem List Items Addressed This Visit       Cardiovascular and Mediastinum   Coronary artery disease involving native heart without angina pectoris    Status post CABG.  He is followed by cardiology and cardiothoracic surgery.  He is on ezetimibe for cholesterol continue medications prescribed continue following with specialist as recommended      Relevant Medications   tadalafil (CIALIS) 20 MG tablet   Varicose veins of both lower extremities with complications    Patient does have some lower extremity edema that he traditionally uses compression socks for.  No pain at this juncture stable      Relevant Medications   tadalafil (CIALIS) 20 MG tablet   Erectile dysfunction due to arterial insufficiency    Patient currently maintained on tadalafil 20 mg as written through [facility] clinic        Relevant Medications   tadalafil (CIALIS) 20 MG tablet     Endocrine   Hypogonadism in male    Patient currently on testosterone injections written through integrative medicine clinic        Genitourinary   Prostate cancer [facility])    History of the same was followed by urology overdue for follow-up.  Will check PSA today      Relevant Orders   PSA, Medicare     Other   Hyperlipidemia    History of the same status post CABG.  On ezetimibe.  Continue pending lipid panel      Relevant Medications   tadalafil (CIALIS) 20 MG tablet   Other Relevant Orders   Lipid panel   Preventative health care - Primary    Discussed age-appropriate immunizations and screening exams.  Did review patient's personal, surgical, social, family histories.  Patient refused all vaccinations.  Patient is overdue for CRC screening.  Ambulatory furl to gastroenterology placed today.  Did do PSA for prostate cancer screening.  Patient was given information at discharge about preventative healthcare maintenance with anticipatory  guidance.      Relevant Orders   CBC   Comprehensive metabolic panel   TSH   Prediabetes    History of same.  Patient has gained some weight pending A1c      Relevant Orders   Hemoglobin A1c   Other Visit  Diagnoses     Encounter for hepatitis C screening test for low risk patient       Relevant Orders   Hepatitis C Antibody   Screening for colon cancer       Relevant Orders   Ambulatory referral to Gastroenterology       Return in about 1 year (around 10/02/2023) for CPE and Labs.    Audria Nine, NP

## 2022-10-02 NOTE — Assessment & Plan Note (Signed)
Patient does have some lower extremity edema that he traditionally uses compression socks for.  No pain at this juncture stable

## 2022-10-02 NOTE — Telephone Encounter (Signed)
Contacted pt and relayed information.  Pt verbalized understanding and stated that he does not donate blood but has in the past.  Advised pt to follow up with Dr.Lantelme with [facility]. Pt agreed and has no questions or concerns.

## 2022-10-02 NOTE — Assessment & Plan Note (Signed)
History of the same was followed by urology overdue for follow-up.  Will check PSA today

## 2022-10-02 NOTE — Telephone Encounter (Signed)
Can we call and let the patient know that his hemoglobin is high. This is likely secondary to the testosterone use.  Does he donate blood. If so that is a simple solution to help with the thick blood. If he does not he needs to follow up with Dr. Lelon Huh with [facility] who prescribes the testosterone

## 2022-10-03 LAB — HEPATITIS C ANTIBODY: Hepatitis C Ab: NONREACTIVE

## 2022-10-06 ENCOUNTER — Encounter: Payer: Self-pay | Admitting: Nurse Practitioner

## 2022-10-10 ENCOUNTER — Other Ambulatory Visit: Payer: Self-pay | Admitting: Nurse Practitioner

## 2022-10-10 DIAGNOSIS — Z1212 Encounter for screening for malignant neoplasm of rectum: Secondary | ICD-10-CM

## 2022-10-10 DIAGNOSIS — Z1211 Encounter for screening for malignant neoplasm of colon: Secondary | ICD-10-CM

## 2022-10-16 NOTE — Progress Notes (Signed)
Cardiology Office Note  Date:  10/17/2022   ID:  Keith Murillo, DOB 1950/01/13, MRN [id number]  PCP:  Eden Emms, NP   Chief Complaint  Patient presents with   6 month follow up     Patient c/o bilateral LE edema & chest pain when first starting to walk but then the pain will subside into the walk. Medications reviewed by the patient verbally.     HPI:  72 yo male with history of CAD s/p remote PCI to LAD and RCA (2004, 2009), (4 stents total)  Hx of CABG x 3 HLD,  OSA not on CPAP prostate cancer, CAD, 2004 with NSTEMI, RCA stenting  cath in 2009 with PCI to the LAD Off statins "since 2010" Non-STEMI November 2023, attempted circumflex intervention unsuccessful, occluded RCA with left-to-right collaterals, 60 to 70% ostial LAD Who presents for f/u of his CAD.  Last seen in clinic by myself February 2024 In follow-up today reports having occasional chest discomfort Not sure if it is from recent stress, daughter getting divorced, lives in [place], frequently in the car traveling back-and-forth, she has several children  Reports he is not on Zetia, only takes diltiazem SR 120 once a day not twice a day Continue on his aspirin and Plavix  Did not want Repatha, does not want statins Prefers less medication Feels his cholesterol is adequate, was told they were normal  No regular exercise program but stays active  EKG personally reviewed by myself on todays visit EKG Interpretation Date/Time:  Friday October 17 2022 08:50:23 EDT Ventricular Rate:  72 PR Interval:  174 QRS Duration:  94 QT Interval:  400 QTC Calculation: 438 R Axis:   19  Text Interpretation: Normal sinus rhythm Normal ECG When compared with ECG of 07-Dec-2021 06:57, ST no longer elevated in Inferior leads ST no longer elevated in Anterolateral leads Confirmed by Julien Nordmann [phone]) on 10/17/2022 9:16:06 AM   Past medical history reviewed non-ST elevation MI troponin 4200 around Thanksgiving  November 2023  Normal LV function,  attempt circumflex intervention by Dr. Herbie Baltimore which was unsuccessful. He does have an occluded RCA with left-to-right collaterals. He has 60 to 70% ostial LAD disease and significant calcified ostial circumflex disease, first obtuse marginal branch disease and AV groove circumflex disease just beyond the marginal takeoff. This supplies several small posterolateral branches.    had coronary bypass grafting x 3 on 12/06/2021. Was noted to have elevated right hemidiaphragm on postoperative chest x-rays.   CT scan recently performed to rule out pleural effusion No pleural effusion noted It was felt his elevated right hemidiaphragm would take 6 to 9 months to resolve  03/2018,enterococcus bacteremia/sepsis TEE without signs of vegetation/endocarditis.  EF by echo 60-65%. Crestor 10mg  daily (now discontinued 2/2 myalgias).    cough on ACEi   stress test in 07/2018.EF 56% and NRWMA.  small defect of mild severity noted in the mid inferolateral and apical inferior location and findings found consistent with a small area of apical inferolateral ischemia.   Lab Results  Component Value Date   CHOL 195 10/02/2022   HDL 39.90 10/02/2022   LDLCALC 116 (H) 10/02/2022   TRIG 193.0 (H) 10/02/2022    PMH:   has a past medical history of Cancer [facility]), Chest pain, Complication of anesthesia, Coronary artery disease, Heart attack (HCC), Hypercholesterolemia, Hyperlipidemia, Hypertension, Ischemic heart disease (09/2002), Obesity, Preventative health care (10/02/2022), and Sleep apnea.  PSH:    Past Surgical History:  Procedure Laterality Date  CARDIAC CATHETERIZATION  11/07/2007   CORONARY ARTERY BYPASS GRAFT N/A 12/06/2021   Procedure: CORONARY ARTERY BYPASS GRAFTING (CABG) X 3 BYPASSES USING LEFT INTERNAL MAMMARY ARTERY AND RIGHT LEG GREATER SAPHENOUS VEIN HARVESTED ENDOSCOPICALLY;  Surgeon: Loreli Slot, MD;  Location: [facility] OR;  Service: Open Heart Surgery;   Laterality: N/A;   HERNIA REPAIR     INGUINAL HERNIA REPAIR Right 09/22/2019   Procedure: RIGHT INGUINAL HERNIA WITH MESH;  Surgeon: Harriette Bouillon, MD;  Location: [facility] OR;  Service: General;  Laterality: Right;  TAP BLOCK   LEFT HEART CATH AND CORONARY ANGIOGRAPHY N/A 11/29/2021   Procedure: LEFT HEART CATH AND CORONARY ANGIOGRAPHY;  Surgeon: Marykay Lex, MD;  Location[facility] INVASIVE CV LAB;  Service: Cardiovascular;  Laterality: N/A;   TEE WITHOUT CARDIOVERSION N/A 03/16/2018   Procedure: TRANSESOPHAGEAL ECHOCARDIOGRAM (TEE);  Surgeon: Elease Hashimoto Deloris Ping, MD;  Location: [facility] ENDOSCOPY;  Service: Cardiovascular;  Laterality: N/A;   TEE WITHOUT CARDIOVERSION N/A 12/06/2021   Procedure: TRANSESOPHAGEAL ECHOCARDIOGRAM (TEE);  Surgeon: Loreli Slot, MD;  Location: [facility] OR;  Service: Open Heart Surgery;  Laterality: N/A;    Current Outpatient Medications  Medication Sig Dispense Refill   acetaminophen (TYLENOL) 500 MG tablet Take 2 tablets (1,000 mg total) by mouth every 6 (six) hours as needed. 30 tablet 0   aspirin EC 81 MG tablet Take 1 tablet (81 mg total) by mouth daily.     Cholecalciferol (VITAMIN D-3) 125 MCG (5000 UT) TABS Take 5,000 Units by mouth daily.      clopidogrel (PLAVIX) 75 MG tablet TAKE 1 TABLET BY MOUTH EVERY DAY 90 tablet 0   Coenzyme Q10 (CVS COQ-10) 200 MG capsule Take 200 mg by mouth daily.     DHEA 25 MG CAPS Take 25 mg by mouth daily.      diltiazem (CARDIZEM SR) 120 MG 12 hr capsule Take 1 capsule (120 mg total) by mouth every 12 (twelve) hours. 60 capsule 3   ezetimibe (ZETIA) 10 MG tablet Take 1 tablet (10 mg total) by mouth daily. 30 tablet 3   Magnesium Citrate POWD Take 1 Scoop by mouth daily as needed (regularity).     melatonin 5 MG TABS Take 5 mg by mouth at bedtime.     Menaquinone-7 (VITAMIN K2 PO) Take 150 mcg by mouth daily. MK-7     NON FORMULARY Take 1 capsule by mouth daily. HAIOTB Circulation Syn3rgy (Beet Root, L-Arginine, Horse Chestnut)      NP THYROID 60 MG tablet Take 60 mg by mouth every morning.     OVER THE COUNTER MEDICATION Take 2 capsules by mouth daily. Muscadine grape capsules - 650 mg each - take 2 capsules by mouth daily     Pregnenolone Micronized (PREGNENOLONE PO) Take 75 mg by mouth daily.     Probiotic Product (PROBIOTIC PO) Take 1 capsule by mouth daily. 20 billion     QUERCETIN PO Take 1 capsule by mouth daily. Bio-Quercetin     tadalafil (CIALIS) 20 MG tablet Take 20 mg by mouth daily as needed for erectile dysfunction.     testosterone cypionate (DEPOTESTOSTERONE CYPIONATE) 200 MG/ML injection Inject 0.5 mLs into the skin once a week. Once a week     traMADol (ULTRAM) 50 MG tablet Take 1 tablet (50 mg total) by mouth every 4 (four) hours as needed for moderate pain. 30 tablet 0   traZODone (DESYREL) 50 MG tablet Take 0.5-1 tablets (25-50 mg total) by mouth at bedtime as needed for  sleep. 30 tablet 0   Turmeric 500 MG TABS Take 500 mg by mouth daily.     UNABLE TO FIND Take 1 drop by mouth daily as needed. Med Name: Cortexi for tinnitus     Zinc 50 MG TABS Take 50 mg by mouth daily.     No current facility-administered medications for this visit.    Allergies:   Ace inhibitors, Beta adrenergic blockers, Codeine, Lisinopril, and Statins   Social History:  The patient  reports that he has never smoked. He has never used smokeless tobacco. He reports current alcohol use of about 2.0 standard drinks of alcohol per week. He reports that he does not use drugs.   Family History:   family history includes Cancer in his maternal grandmother and mother; Hearing loss in his father; Heart attack in his paternal grandfather; Parkinson's disease in his maternal grandfather; Stroke in his maternal grandfather and paternal grandmother; Varicose Veins in his father.    Review of Systems: Review of Systems  Constitutional: Negative.   HENT: Negative.    Respiratory: Negative.    Cardiovascular: Negative.   Gastrointestinal:  Negative.   Musculoskeletal: Negative.   Neurological: Negative.   Psychiatric/Behavioral: Negative.    All other systems reviewed and are negative.  PHYSICAL EXAM: VS:  BP 130/80 (BP Location: Left Arm, Patient Position: Sitting, Cuff Size: Normal)   Pulse 72   Ht 5\' 7"  (1.702 m)   Wt 230 lb (104.3 kg)   SpO2 95%   BMI 36.02 kg/m  , BMI Body mass index is 36.02 kg/m. Constitutional:  oriented to person, place, and time. No distress.  HENT:  Head: Grossly normal Eyes:  no discharge. No scleral icterus.  Neck: No JVD, no carotid bruits  Cardiovascular: Regular rate and rhythm, no murmurs appreciated Pulmonary/Chest: Clear to auscultation bilaterally, no wheezes or rails Abdominal: Soft.  no distension.  no tenderness.  Musculoskeletal: Normal range of motion Neurological:  normal muscle tone. Coordination normal. No atrophy Skin: Skin warm and dry Psychiatric: normal affect, pleasant   Recent Labs: 12/07/2021: Magnesium 2.3 10/02/2022: ALT 34; BUN 12; Creatinine, Ser 0.94; Hemoglobin 18.0 Repeated and verified X2.; Platelets 303.0; Potassium 4.1; Sodium 138; TSH 1.76    Lipid Panel Lab Results  Component Value Date   CHOL 195 10/02/2022   HDL 39.90 10/02/2022   LDLCALC 116 (H) 10/02/2022   TRIG 193.0 (H) 10/02/2022      Wt Readings from Last 3 Encounters:  10/17/22 230 lb (104.3 kg)  10/02/22 230 lb 3.2 oz (104.4 kg)  08/12/22 225 lb (102.1 kg)     ASSESSMENT AND PLAN:  Problem List Items Addressed This Visit       Cardiology Problems   Aortic atherosclerosis (HCC)   Relevant Orders   EKG 12-Lead (Completed)     Other   Prediabetes   S/P CABG x 3   Relevant Orders   EKG 12-Lead (Completed)   Other Visit Diagnoses     Coronary artery disease of native artery of native heart with stable angina pectoris (HCC)    -  Primary   Relevant Orders   EKG 12-Lead (Completed)   Essential hypertension       Relevant Orders   EKG 12-Lead (Completed)    Hyperlipidemia LDL goal <70       OSA on CPAP       BMI 33.0-33.9,adult          Coronary artery disease with stable angina History of CABG  prior stenting 2004, 2009 Stopped statin 2010, does not want Zetia or Repatha Worsening chest discomfort, does not want isosorbide Reports he will call if it gets worse, does not want sublingual  Recommended he call us if symptoms get worse, medications and possibly intervention might be needed  Sleep apnea not using his CPAP Previously reported symptoms better after weight loss  History of prostate cancer Followed by urology, PSA  Sildenafil refilled at his request  Obesity We have encouraged continued exercise, careful diet management in an effort to lose weight.  Hyperlipidemia As above feels his cholesterol is fine does not want medication Discussed Zetia, declined Discussed statins, declined, does not want PCSK9 inhibitor  Essential hypertension Reports he is taking diltiazem SR once a day, recommend he change to diltiazem ER 120 mg daily for 24-hour coverage   Total encounter time more than 30 minutes  Greater than 50% was spent in counseling and coordination of care with the patient   Signed, Dossie Arbour, M.D., Ph.D. [facility] [place], [place] [phone]

## 2022-10-17 ENCOUNTER — Ambulatory Visit: Payer: Medicare Other | Attending: Cardiovascular Disease | Admitting: Cardiovascular Disease

## 2022-10-17 ENCOUNTER — Encounter: Payer: Self-pay | Admitting: Cardiovascular Disease

## 2022-10-17 VITALS — BP 130/80 | HR 72 | Ht 67.0 in | Wt 230.0 lb

## 2022-10-17 DIAGNOSIS — I700 Atherosclerosis of aorta: Secondary | ICD-10-CM

## 2022-10-17 DIAGNOSIS — E785 Hyperlipidemia, unspecified: Secondary | ICD-10-CM | POA: Diagnosis not present

## 2022-10-17 DIAGNOSIS — R7303 Prediabetes: Secondary | ICD-10-CM

## 2022-10-17 DIAGNOSIS — I10 Essential (primary) hypertension: Secondary | ICD-10-CM

## 2022-10-17 DIAGNOSIS — Z951 Presence of aortocoronary bypass graft: Secondary | ICD-10-CM

## 2022-10-17 DIAGNOSIS — Z6833 Body mass index (BMI) 33.0-33.9, adult: Secondary | ICD-10-CM

## 2022-10-17 DIAGNOSIS — I25118 Atherosclerotic heart disease of native coronary artery with other forms of angina pectoris: Secondary | ICD-10-CM

## 2022-10-17 DIAGNOSIS — G4733 Obstructive sleep apnea (adult) (pediatric): Secondary | ICD-10-CM

## 2022-10-17 MED ORDER — DILTIAZEM HCL ER COATED BEADS 120 MG PO CP24: 120.0000 mg | ORAL_CAPSULE | Freq: Every day | ORAL | 3 refills | Status: DC

## 2022-10-17 MED ORDER — CLOPIDOGREL BISULFATE 75 MG PO TABS: 75.0000 mg | ORAL_TABLET | Freq: Every day | ORAL | 3 refills | Status: DC

## 2022-10-17 NOTE — Patient Instructions (Addendum)
Medication Instructions:  Hold the diltiazem 12 hr, Start diltiazem 24 hr pill once a day  Call if you would like long acting nitroglycerin for chest pain  Call if you would like Zetia for cholesterol  If you need a refill on your cardiac medications before your next appointment, please call your pharmacy.   Lab work: No new labs needed  Testing/Procedures: No new testing needed  Follow-Up: At [facility], you and your health needs are our priority.  As part of our continuing mission to provide you with exceptional heart care, we have created designated Provider Care Teams.  These Care Teams include your primary Cardiologist (physician) and Advanced Practice Providers (APPs -  Physician Assistants and Nurse Practitioners) who all work together to provide you with the care you need, when you need it.  You will need a follow up appointment in 12 months  Providers on your designated Care Team:   Nicolasa Ducking, NP Eula Listen, PA-C Cadence Fransico [place], [place]  COVID-19 Vaccine Information can be found at: [email or web address] For questions related to vaccine distribution or appointments, please email [email or web address] or call [phone].

## 2022-10-23 DIAGNOSIS — Z1211 Encounter for screening for malignant neoplasm of colon: Secondary | ICD-10-CM | POA: Diagnosis not present

## 2022-10-23 DIAGNOSIS — Z1212 Encounter for screening for malignant neoplasm of rectum: Secondary | ICD-10-CM | POA: Diagnosis not present

## 2022-10-30 LAB — COLOGUARD: COLOGUARD: NEGATIVE

## 2022-11-07 ENCOUNTER — Ambulatory Visit: Payer: Medicare Other | Admitting: Cardiovascular Disease

## 2022-11-19 ENCOUNTER — Encounter: Payer: Self-pay | Admitting: Nurse Practitioner

## 2022-12-22 ENCOUNTER — Other Ambulatory Visit: Payer: Self-pay | Admitting: Thoracic Surgery (Cardiothoracic Vascular Surgery)

## 2022-12-22 DIAGNOSIS — Z951 Presence of aortocoronary bypass graft: Secondary | ICD-10-CM

## 2022-12-23 ENCOUNTER — Ambulatory Visit: Payer: Medicare Other | Admitting: Thoracic Surgery (Cardiothoracic Vascular Surgery)

## 2022-12-23 ENCOUNTER — Ambulatory Visit
Admission: RE | Admit: 2022-12-23 | Discharge: 2022-12-23 | Disposition: A | Discharge status: Home / Self Care | Payer: Medicare Other | Source: Ambulatory Visit | Attending: Thoracic Surgery (Cardiothoracic Vascular Surgery) | Admitting: Thoracic Surgery (Cardiothoracic Vascular Surgery)

## 2022-12-23 VITALS — BP 165/85 | HR 74 | Resp 20 | Ht 67.0 in | Wt 233.0 lb

## 2022-12-23 DIAGNOSIS — J986 Disorders of diaphragm: Secondary | ICD-10-CM | POA: Diagnosis not present

## 2022-12-23 DIAGNOSIS — Z951 Presence of aortocoronary bypass graft: Secondary | ICD-10-CM

## 2022-12-23 NOTE — Progress Notes (Signed)
[address]       [place] [address]             [phone]     HPI: [name] returns for follow-up of an elevated right hemidiaphragm.  Tannor Pretorius is a 72 year old man with a history of coronary disease, MI, CABG, PCI, hypertension, hyperlipidemia, hypothyroidism, obesity, sleep apnea, prostate cancer, and an elevated hemidiaphragm.  He had coronary bypass grafting x 3 on 12/06/2021.  Postoperatively he had a elevated right hemidiaphragm.  It was of unclear origin as we have not done a right mammary harvest.  I last saw him in June 2024.  He had a sniff test which showed some movement of the diaphragm but there was some paradoxical motion of the right hemidiaphragm medially.  In the interim since that visit he has been doing well.  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  He is not having any chest pain, pressure, or tightness.  Does get short of breath when he is extremely active but not with routine activities.  Past Medical History:  Diagnosis Date   Cancer [facility])    prostate   Chest pain    Complication of anesthesia    fighting with waking up   Coronary artery disease    Heart attack (HCC)    Hypercholesterolemia    Hyperlipidemia    Hypertension    Ischemic heart disease 09/2002   Known--with non-Q-wave myocardial infarction    Obesity    Preventative health care 10/02/2022   Sleep apnea     Current Outpatient Medications  Medication Sig Dispense Refill   acetaminophen (TYLENOL) 500 MG tablet Take 2 tablets (1,000 mg total) by mouth every 6 (six) hours as needed. 30 tablet 0   aspirin EC 81 MG tablet Take 1 tablet (81 mg total) by mouth daily.     Cholecalciferol (VITAMIN D-3) 125 MCG (5000 UT) TABS Take 5,000 Units by mouth daily.      clopidogrel (PLAVIX) 75 MG tablet Take 1 tablet (75 mg total) by mouth daily. 90 tablet 3   Coenzyme Q10 (CVS COQ-10) 200 MG capsule Take 200 mg by mouth daily.     DHEA 25 MG CAPS Take  25 mg by mouth daily.      diltiazem (CARDIZEM CD) 120 MG 24 hr capsule Take 1 capsule (120 mg total) by mouth daily. 90 capsule 3   ezetimibe (ZETIA) 10 MG tablet Take 1 tablet (10 mg total) by mouth daily. 30 tablet 3   Magnesium Citrate POWD Take 1 Scoop by mouth daily as needed (regularity).     melatonin 5 MG TABS Take 5 mg by mouth at bedtime.     Menaquinone-7 (VITAMIN K2 PO) Take 150 mcg by mouth daily. MK-7     NON FORMULARY Take 1 capsule by mouth daily. HAIOTB Circulation Syn3rgy (Beet Root, L-Arginine, Horse Chestnut)     NP THYROID 60 MG tablet Take 60 mg by mouth every morning.     OVER THE COUNTER MEDICATION Take 2 capsules by mouth daily. Muscadine grape capsules - 650 mg each - take 2 capsules by mouth daily     Pregnenolone Micronized (PREGNENOLONE PO) Take 75 mg by mouth daily.     Probiotic Product (PROBIOTIC PO) Take 1 capsule by mouth daily. 20 billion     QUERCETIN PO Take 1 capsule by mouth daily. Bio-Quercetin     tadalafil (CIALIS) 20 MG tablet Take 20 mg  by mouth daily as needed for erectile dysfunction.     testosterone cypionate (DEPOTESTOSTERONE CYPIONATE) 200 MG/ML injection Inject 0.5 mLs into the skin once a week. Once a week     traZODone (DESYREL) 50 MG tablet Take 0.5-1 tablets (25-50 mg total) by mouth at bedtime as needed for sleep. 30 tablet 0   Turmeric 500 MG TABS Take 500 mg by mouth daily.     UNABLE TO FIND Take 1 drop by mouth daily as needed. Med Name: Cortexi for tinnitus     Zinc 50 MG TABS Take 50 mg by mouth daily.     traMADol (ULTRAM) 50 MG tablet Take 1 tablet (50 mg total) by mouth every 4 (four) hours as needed for moderate pain. (Patient not taking: Reported on 12/23/2022) 30 tablet 0   No current facility-administered medications for this visit.    Physical Exam BP (!) 165/85   Pulse 74   Resp 20   Ht 5\' 7"  (1.702 m)   Wt 233 lb (105.7 kg)   SpO2 96% Comment: RA  BMI 36.71 kg/m  72 year old man in no acute distress Alert and  oriented x 3 with no focal deficits Lungs clear with equal breath sounds bilaterally Cardiac regular rate and rhythm  Diagnostic Tests: I personally reviewed his chest x-ray images.  Further improvement in aeration of right base and excursion of the diaphragm and comparison to his films from June.  Impression: Keith Murillo is a 72 year old man with a history of coronary disease, MI, CABG, PCI, hypertension, hyperlipidemia, hypothyroidism, obesity, sleep apnea, prostate cancer, and an elevated hemidiaphragm.  Status post CABG-doing well from a cardiac standpoint.  Elevated right hemidiaphragm-diaphragm has essentially returned to normal.  There might be minimal elevation, but appears to be within normal limits.  No need for further follow-up  Plan: I will be happy to see Mr. Jourdan back anytime in the future if I can be of any further assistance with his care.  Loreli Slot, MD [facility] [phone]

## 2023-01-27 ENCOUNTER — Telehealth: Payer: Self-pay | Admitting: Cardiovascular Disease

## 2023-01-27 NOTE — Telephone Encounter (Signed)
Calling to get Statin history or the patient. Please advise

## 2023-01-27 NOTE — Telephone Encounter (Signed)
Called and spoke with patient. Informed patient that [employer] was calling to get history on Statins. Patient gave verbal permission to call [employer].  Called and left a message for Deedra from [facility] to call back.

## 2023-01-27 NOTE — Telephone Encounter (Signed)
Spoke with Deedra. Deedra inquiring about patient intolerance to statins for approval for Repatha. Deedra informed that per the patient's chart that he did not tolerate statins due to Myalgias.

## 2023-05-26 ENCOUNTER — Encounter (INDEPENDENT_AMBULATORY_CARE_PROVIDER_SITE_OTHER): Payer: Self-pay

## 2023-08-17 ENCOUNTER — Ambulatory Visit: Payer: Medicare Other

## 2023-08-17 VITALS — BP 130/80 | Ht 67.0 in | Wt 218.0 lb

## 2023-08-17 DIAGNOSIS — Z2821 Immunization not carried out because of patient refusal: Secondary | ICD-10-CM | POA: Diagnosis not present

## 2023-08-17 DIAGNOSIS — Z0000 Encounter for general adult medical examination without abnormal findings: Secondary | ICD-10-CM

## 2023-08-17 NOTE — Patient Instructions (Signed)
 Mr. Rackley , Thank you for taking time out of your busy schedule to complete your Annual Wellness Visit with me. I enjoyed our conversation and look forward to speaking with you again next year. I, as well as your care team,  appreciate your ongoing commitment to your health goals. Please review the following plan we discussed and let me know if I can assist you in the future. Your Game plan/ To Do List    Referrals: If you haven't heard from the office you've been referred to, please reach out to them at the phone provided.   Follow up Visits: We will see or speak with you next year for your Next Medicare AWV with our clinical staff Have you seen your provider in the last 6 months (3 months if uncontrolled diabetes)? Yes  Clinician Recommendations:  Aim for 30 minutes of exercise or brisk walking, 6-8 glasses of water, and 5 servings of fruits and vegetables each day.       This is a list of the screenings recommended for you:  Health Maintenance  Topic Date Due   DTaP/Tdap/Td vaccine (1 - Tdap) Never done   Pneumococcal Vaccine for age over 43 (1 of 2 - PCV) Never done   Zoster (Shingles) Vaccine (1 of 2) Never done   Flu Shot  08/07/2023   Medicare Annual Wellness Visit  08/16/2024   Cologuard (Stool DNA test)  10/22/2025   Hepatitis C Screening  Completed   Hepatitis B Vaccine  Aged Out   HPV Vaccine  Aged Out   Meningitis B Vaccine  Aged Out   Colon Cancer Screening  Discontinued   COVID-19 Vaccine  Discontinued    Advanced directives: (Declined) Advance directive discussed with you today. Even though you declined this today, please call our office should you change your mind, and we can give you the proper paperwork for you to fill out. Advance Care Planning is important because it:  [x]  Makes sure you receive the medical care that is consistent with your values, goals, and preferences  [x]  It provides guidance to your family and loved ones and reduces their decisional  burden about whether or not they are making the right decisions based on your wishes.  Follow the link provided in your after visit summary or read over the paperwork we have mailed to you to help you started getting your Advance Directives in place. If you need assistance in completing these, please reach out to us  so that we can help you!  See attachments for Preventive Care and Fall Prevention Tips.

## 2023-08-17 NOTE — Progress Notes (Signed)
 Because this visit was a virtual/telehealth visit,  certain criteria was not obtained, such a blood pressure, CBG if applicable, and timed get up and go. Any medications not marked as taking were not mentioned during the medication reconciliation part of the visit. Any vitals not documented were not able to be obtained due to this being a telehealth visit or patient was unable to self-report a recent blood pressure reading due to a lack of equipment at home via telehealth. Vitals that have been documented are verbally provided by the patient.  This visit was performed by a medical professional under my direct supervision. I was immediately available for consultation/collaboration. I have reviewed and agree with the Annual Wellness Visit documentation.  Subjective:   Keith Murillo is a 73 y.o. who presents for a Medicare Wellness preventive visit.  As a reminder, Annual Wellness Visits don't include a physical exam, and some assessments may be limited, especially if this visit is performed virtually. We may recommend an in-person follow-up visit with your provider if needed.  Visit Complete: Virtual I connected with  [name] on 08/17/23 by a audio enabled telemedicine application and verified that I am speaking with the correct person using two identifiers.  Patient Location: Home  Provider Location: Home Office  I discussed the limitations of evaluation and management by telemedicine. The patient expressed understanding and agreed to proceed.  Vital Signs: Because this visit was a virtual/telehealth visit, some criteria may be missing or patient reported. Any vitals not documented were not able to be obtained and vitals that have been documented are patient reported.  VideoDeclined- This patient declined Librarian, academic. Therefore the visit was completed with audio only.  Persons Participating in Visit: Patient.  AWV Questionnaire: No: Patient  Medicare AWV questionnaire was not completed prior to this visit.  Cardiac Risk Factors include: advanced age (>24men, >76 women);obesity (BMI >30kg/m2);dyslipidemia     Objective:    Today's Vitals   08/17/23 0901  BP: 130/80  Weight: 218 lb (98.9 kg)  Height: 5' 7 (1.702 m)   Body mass index is 34.14 kg/m.     08/17/2023    9:07 AM 08/12/2022    8:45 AM 12/03/2021    4:05 PM 11/27/2021   11:00 PM 08/03/2021    2:04 PM 01/04/2021    1:27 PM 09/19/2019   10:48 AM  Advanced Directives  Does Patient Have a Medical Advance Directive? No No No No No No No  Would patient like information on creating a medical advance directive? No - Patient declined No - Patient declined Yes (Inpatient - patient defers creating a medical advance directive at this time - Information given) Yes (Inpatient - patient requests chaplain consult to create a medical advance directive) No - Patient declined      Current Medications (verified) Outpatient Encounter Medications as of 08/17/2023  Medication Sig   acetaminophen  (TYLENOL ) 500 MG tablet Take 2 tablets (1,000 mg total) by mouth every 6 (six) hours as needed.   aspirin  EC 81 MG tablet Take 1 tablet (81 mg total) by mouth daily.   Cholecalciferol (VITAMIN D -3) 125 MCG (5000 UT) TABS Take 5,000 Units by mouth daily.    clopidogrel  (PLAVIX ) 75 MG tablet Take 1 tablet (75 mg total) by mouth daily.   Coenzyme Q10 ([employer] COQ-10) 200 MG capsule Take 200 mg by mouth daily.   DHEA 25 MG CAPS Take 25 mg by mouth daily.    diltiazem  (CARDIZEM  CD) 120 MG  24 hr capsule Take 1 capsule (120 mg total) by mouth daily.   ezetimibe  (ZETIA ) 10 MG tablet Take 1 tablet (10 mg total) by mouth daily.   Magnesium  Citrate POWD Take 1 Scoop by mouth daily as needed (regularity).   melatonin 5 MG TABS Take 5 mg by mouth at bedtime.   Menaquinone-7 (VITAMIN K2 PO) Take 150 mcg by mouth daily. MK-7   NON FORMULARY Take 1 capsule by mouth daily. HAIOTB Circulation Syn3rgy (Beet  Root, L-Arginine, Horse Chestnut)   NP THYROID  60 MG tablet Take 60 mg by mouth every morning.   OVER THE COUNTER MEDICATION Take 2 capsules by mouth daily. Muscadine grape capsules - 650 mg each - take 2 capsules by mouth daily   Pregnenolone Micronized (PREGNENOLONE PO) Take 75 mg by mouth daily.   Probiotic Product (PROBIOTIC PO) Take 1 capsule by mouth daily. 20 billion   QUERCETIN PO Take 1 capsule by mouth daily. Bio-Quercetin   tadalafil (CIALIS) 20 MG tablet Take 20 mg by mouth daily as needed for erectile dysfunction.   testosterone  cypionate (DEPOTESTOSTERONE CYPIONATE) 200 MG/ML injection Inject 0.5 mLs into the skin once a week. Once a week   traMADol  (ULTRAM ) 50 MG tablet Take 1 tablet (50 mg total) by mouth every 4 (four) hours as needed for moderate pain.   traZODone  (DESYREL ) 50 MG tablet Take 0.5-1 tablets (25-50 mg total) by mouth at bedtime as needed for sleep.   Turmeric 500 MG TABS Take 500 mg by mouth daily.   UNABLE TO FIND Take 1 drop by mouth daily as needed. Med Name: Cortexi for tinnitus   Zinc 50 MG TABS Take 50 mg by mouth daily.   No facility-administered encounter medications on file as of 08/17/2023.    Allergies (verified) Ace inhibitors, Beta adrenergic blockers, Codeine, Lisinopril , and Statins   History: Past Medical History:  Diagnosis Date   Cancer (HCC)    prostate   Chest pain    Complication of anesthesia    fighting with waking up   Coronary artery disease    Heart attack (HCC)    Hypercholesterolemia    Hyperlipidemia    Hypertension    Ischemic heart disease 09/2002   Known--with non-Q-wave myocardial infarction    Obesity    Preventative health care 10/02/2022   Sleep apnea    Past Surgical History:  Procedure Laterality Date   CARDIAC CATHETERIZATION  11/07/2007   CORONARY ARTERY BYPASS GRAFT N/A 12/06/2021   Procedure: CORONARY ARTERY BYPASS GRAFTING (CABG) X 3 BYPASSES USING LEFT INTERNAL MAMMARY ARTERY AND RIGHT LEG GREATER  SAPHENOUS VEIN HARVESTED ENDOSCOPICALLY;  Surgeon: Kerrin Elspeth BROCKS, MD;  Location: [facility] OR;  Service: Open Heart Surgery;  Laterality: N/A;   HERNIA REPAIR     INGUINAL HERNIA REPAIR Right 09/22/2019   Procedure: RIGHT INGUINAL HERNIA WITH MESH;  Surgeon: Vanderbilt Ned, MD;  Location: [facility] OR;  Service: General;  Laterality: Right;  TAP BLOCK   LEFT HEART CATH AND CORONARY ANGIOGRAPHY N/A 11/29/2021   Procedure: LEFT HEART CATH AND CORONARY ANGIOGRAPHY;  Surgeon: Anner Alm ORN, MD;  Location: [facility] INVASIVE CV LAB;  Service: Cardiovascular;  Laterality: N/A;   TEE WITHOUT CARDIOVERSION N/A 03/16/2018   Procedure: TRANSESOPHAGEAL ECHOCARDIOGRAM (TEE);  Surgeon: Alveta Aleene PARAS, MD;  Location: [facility] ENDOSCOPY;  Service: Cardiovascular;  Laterality: N/A;   TEE WITHOUT CARDIOVERSION N/A 12/06/2021   Procedure: TRANSESOPHAGEAL ECHOCARDIOGRAM (TEE);  Surgeon: Kerrin Elspeth BROCKS, MD;  Location: [facility] OR;  Service: Open Heart Surgery;  Laterality: N/A;   Family History  Problem Relation Age of Onset   Cancer Mother        larynx   Hearing loss Father    Varicose Veins Father    Cancer Maternal Grandmother        intestional   Parkinson's disease Maternal Grandfather    Stroke Maternal Grandfather    Stroke Paternal Grandmother    Heart attack Paternal Grandfather    Social History   Socioeconomic History   Marital status: Married    Spouse name: Doyal   Number of children: 3   Years of education: 16   Highest education level: Bachelor's degree (e.g., BA, AB, BS)  Occupational History   Not on file  Tobacco Use   Smoking status: Never   Smokeless tobacco: Never  Vaping Use   Vaping status: Never Used  Substance and Sexual Activity   Alcohol use: Yes    Alcohol/week: 2.0 standard drinks of alcohol    Types: 2 Cans of beer per week    Comment: occas   Drug use: No   Sexual activity: Yes    Birth control/protection: None  Other Topics Concern   Not on file  Social History  Narrative   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   Social Drivers of Corporate investment banker Strain: Low Risk  (08/17/2023)   Overall Financial Resource Strain (CARDIA)    Difficulty of Paying Living Expenses: Not hard at all  Food Insecurity: No Food Insecurity (08/17/2023)   Hunger Vital Sign    Worried About Running Out of Food in the Last Year: Never true    Ran Out of Food in the Last Year: Never true  Transportation Needs: No Transportation Needs (08/17/2023)   PRAPARE - Administrator, Civil Service (Medical): No    Lack of Transportation (Non-Medical): No  Physical Activity: Sufficiently Active (08/17/2023)   Exercise Vital Sign    Days of Exercise per Week: 7 days    Minutes of Exercise per Session: 30 min  Stress: No Stress Concern Present (08/17/2023)   [employer] of Occupational Health - Occupational Stress Questionnaire    Feeling of Stress: Not at all  Social Connections: Moderately Isolated (08/17/2023)   Social Connection and Isolation Panel    Frequency of Communication with Friends and Family: More than three times a week    Frequency of Social Gatherings with Friends and Family: More than three times a week    Attends Religious Services: Never    Database administrator or Organizations: No    Attends Engineer, structural: Never    Marital Status: Married    Tobacco Counseling Counseling given: Not Answered    Clinical Intake:  Pre-visit preparation completed: Yes  Pain : 0-10 Pain Type: Acute pain Pain Location: Other (Comment) (right lung area) Pain Orientation: Right Pain Descriptors / Indicators: Shooting Pain Onset: Today Pain Frequency: Occasional     BMI - recorded: 34.14 Nutritional Status: BMI > 30  Obese Nutritional Risks: None Diabetes: No  Lab Results  Component Value Date   HGBA1C 5.6 10/02/2022   HGBA1C 5.7 (H) 11/28/2021   HGBA1C 5.9 03/26/2018     How often do you need to have someone help  you when you read instructions, pamphlets, or other written materials from your doctor or pharmacy?: 1 - Never What is the last grade level you completed in school?: college  Interpreter Needed?: No  Information entered by ::  Tavarious Freel,CMA   Activities of Daily Living     08/17/2023    9:06 AM  In your present state of health, do you have any difficulty performing the following activities:  Hearing? 0  Vision? 0  Difficulty concentrating or making decisions? 0  Walking or climbing stairs? 0  Dressing or bathing? 0  Doing errands, shopping? 0  Preparing Food and eating ? N  Using the Toilet? N  In the past six months, have you accidently leaked urine? N  Do you have problems with loss of bowel control? N  Managing your Medications? N  Managing your Finances? N  Housekeeping or managing your Housekeeping? N    Patient Care Team: Wendee Lynwood HERO, NP as PCP - General (Nurse Practitioner) Perla Evalene PARAS, MD as PCP - Cardiology (Cardiology)  I have updated your Care Teams any recent Medical Services you may have received from other providers in the past year.     Assessment:   This is a routine wellness examination for Rennie.  Hearing/Vision screen Hearing Screening - Comments:: Patient has some difficulties and has some hearing aids  Vision Screening - Comments:: Patient wears glasses    Goals Addressed             This Visit's Progress    Patient Stated   On track    Get my weight under control       Depression Screen     08/17/2023    9:08 AM 10/02/2022   10:14 AM 08/12/2022    8:48 AM 04/25/2022   11:47 AM 03/27/2022    1:58 PM 02/13/2022    2:59 PM 12/26/2021    8:45 AM  PHQ 2/9 Scores  PHQ - 2 Score 0 0 0 0 0 0 0  PHQ- 9 Score 0 0  1 0 1     Fall Risk     08/17/2023    9:07 AM 10/02/2022   10:14 AM 08/12/2022    8:46 AM 04/25/2022   10:49 AM 04/23/2022   10:00 AM  Fall Risk   Falls in the past year? 0 0 0 0 0  Number falls in past yr: 0 0 0  0 0  Injury with Fall? 0 0 0 0 0  Risk for fall due to : No Fall Risks No Fall Risks No Fall Risks No Fall Risks No Fall Risks  Follow up Falls evaluation completed Falls evaluation completed Falls prevention discussed Falls evaluation completed Falls evaluation completed;Education provided;Falls prevention discussed    MEDICARE RISK AT HOME:  Medicare Risk at Home Any stairs in or around the home?: Yes If so, are there any without handrails?: No Home free of loose throw rugs in walkways, pet beds, electrical cords, etc?: Yes Adequate lighting in your home to reduce risk of falls?: Yes Life alert?: No Use of a cane, walker or w/c?: No Grab bars in the bathroom?: No Shower chair or bench in shower?: Yes Elevated toilet seat or a handicapped toilet?: No  TIMED UP AND GO:  Was the test performed?  No  Cognitive Function: 6CIT completed        08/17/2023    9:04 AM 08/12/2022    8:46 AM  6CIT Screen  What Year? 0 points 0 points  What month? 0 points 0 points  What time? 0 points 0 points  Count back from 20 0 points 0 points  Months in reverse 0 points 0 points  Repeat phrase 0 points 0  points  Total Score 0 points 0 points    Immunizations There is no immunization history for the selected administration types on file for this patient.  Screening Tests Health Maintenance  Topic Date Due   DTaP/Tdap/Td (1 - Tdap) Never done   Pneumococcal Vaccine: 50+ Years (1 of 2 - PCV) Never done   Zoster Vaccines- Shingrix (1 of 2) Never done   INFLUENZA VACCINE  08/07/2023   Medicare Annual Wellness (AWV)  08/16/2024   Fecal DNA (Cologuard)  10/22/2025   Hepatitis C Screening  Completed   Hepatitis B Vaccines  Aged Out   HPV VACCINES  Aged Out   Meningococcal B Vaccine  Aged Out   Colonoscopy  Discontinued   COVID-19 Vaccine  Discontinued    Health Maintenance  Health Maintenance Due  Topic Date Due   DTaP/Tdap/Td (1 - Tdap) Never done   Pneumococcal Vaccine: 50+ Years (1  of 2 - PCV) Never done   Zoster Vaccines- Shingrix (1 of 2) Never done   INFLUENZA VACCINE  08/07/2023   Health Maintenance Items Addressed:patient declined health maintenance  Additional Screening:  Vision Screening: Recommended annual ophthalmology exams for early detection of glaucoma and other disorders of the eye. Would you like a referral to an eye doctor? No    Dental Screening: Recommended annual dental exams for proper oral hygiene  Community Resource Referral / Chronic Care Management: CRR required this visit?  No   CCM required this visit?  No   Plan:    I have personally reviewed and noted the following in the patient's chart:   Medical and social history Use of alcohol, tobacco or illicit drugs  Current medications and supplements including opioid prescriptions. Patient is not currently taking opioid prescriptions. Functional ability and status Nutritional status Physical activity Advanced directives List of other physicians Hospitalizations, surgeries, and ER visits in previous 12 months Vitals Screenings to include cognitive, depression, and falls Referrals and appointments  In addition, I have reviewed and discussed with patient certain preventive protocols, quality metrics, and best practice recommendations. A written personalized care plan for preventive services as well as general preventive health recommendations were provided to patient.   Lyle MARLA Right, [place]   08/17/2023   After Visit Summary: (MyChart) Due to this being a telephonic visit, the after visit summary with patients personalized plan was offered to patient via MyChart   Notes: Nothing significant to report at this time.

## 2023-10-06 ENCOUNTER — Other Ambulatory Visit

## 2023-10-06 ENCOUNTER — Other Ambulatory Visit (INDEPENDENT_AMBULATORY_CARE_PROVIDER_SITE_OTHER): Payer: Self-pay | Admitting: Nurse Practitioner

## 2023-10-06 ENCOUNTER — Other Ambulatory Visit (INDEPENDENT_AMBULATORY_CARE_PROVIDER_SITE_OTHER)

## 2023-10-06 DIAGNOSIS — E785 Hyperlipidemia, unspecified: Secondary | ICD-10-CM

## 2023-10-06 DIAGNOSIS — I2510 Atherosclerotic heart disease of native coronary artery without angina pectoris: Secondary | ICD-10-CM | POA: Diagnosis not present

## 2023-10-06 DIAGNOSIS — Z125 Encounter for screening for malignant neoplasm of prostate: Secondary | ICD-10-CM | POA: Diagnosis not present

## 2023-10-06 DIAGNOSIS — N401 Enlarged prostate with lower urinary tract symptoms: Secondary | ICD-10-CM | POA: Diagnosis not present

## 2023-10-06 DIAGNOSIS — R7303 Prediabetes: Secondary | ICD-10-CM

## 2023-10-06 DIAGNOSIS — I700 Atherosclerosis of aorta: Secondary | ICD-10-CM

## 2023-10-06 LAB — CBC
HCT: 52.9 % — ABNORMAL HIGH (ref 39.0–52.0)
Hemoglobin: 17.6 g/dL — ABNORMAL HIGH (ref 13.0–17.0)
MCHC: 33.3 g/dL (ref 30.0–36.0)
MCV: 90.1 fl (ref 78.0–100.0)
Platelets: 274 K/uL (ref 150.0–400.0)
RBC: 5.87 Mil/uL — ABNORMAL HIGH (ref 4.22–5.81)
RDW: 14.7 % (ref 11.5–15.5)
WBC: 7 K/uL (ref 4.0–10.5)

## 2023-10-06 LAB — COMPREHENSIVE METABOLIC PANEL WITH GFR
ALT: 23 U/L (ref 0–53)
AST: 20 U/L (ref 0–37)
Albumin: 4 g/dL (ref 3.5–5.2)
Alkaline Phosphatase: 51 U/L (ref 39–117)
BUN: 11 mg/dL (ref 6–23)
CO2: 30 meq/L (ref 19–32)
Calcium: 9.2 mg/dL (ref 8.4–10.5)
Chloride: 101 meq/L (ref 96–112)
Creatinine, Ser: 0.89 mg/dL (ref 0.40–1.50)
GFR: 85.84 mL/min (ref >60.00)
Glucose, Bld: 108 mg/dL — ABNORMAL HIGH (ref 70–99)
Potassium: 3.9 meq/L (ref 3.5–5.1)
Sodium: 136 meq/L (ref 135–145)
Total Bilirubin: 0.6 mg/dL (ref 0.2–1.2)
Total Protein: 6.1 g/dL (ref 6.0–8.3)

## 2023-10-06 LAB — LIPID PANEL
Cholesterol: 198 mg/dL (ref 0–200)
HDL: 31.7 mg/dL — ABNORMAL LOW (ref >39.00)
LDL Cholesterol: 132 mg/dL — ABNORMAL HIGH (ref 0–99)
NonHDL: 166.41
Total CHOL/HDL Ratio: 6
Triglycerides: 174 mg/dL — ABNORMAL HIGH (ref 0.0–149.0)
VLDL: 34.8 mg/dL (ref 0.0–40.0)

## 2023-10-06 LAB — HEMOGLOBIN A1C: Hgb A1c MFr Bld: 5.8 % (ref 4.6–6.5)

## 2023-10-06 LAB — TSH: TSH: 1.5 u[IU]/mL (ref 0.35–5.50)

## 2023-10-06 LAB — PSA, MEDICARE: PSA: 8.32 ng/mL — ABNORMAL HIGH (ref 0.10–4.00)

## 2023-10-07 ENCOUNTER — Ambulatory Visit: Payer: Self-pay | Admitting: Nurse Practitioner

## 2023-10-08 ENCOUNTER — Telehealth: Payer: Self-pay | Admitting: Nurse Practitioner

## 2023-10-08 ENCOUNTER — Encounter: Payer: Self-pay | Admitting: Nurse Practitioner

## 2023-10-08 ENCOUNTER — Ambulatory Visit: Payer: Medicare Other | Admitting: Nurse Practitioner

## 2023-10-08 VITALS — BP 128/82 | HR 76 | Temp 97.9°F | Ht 67.0 in | Wt 220.8 lb

## 2023-10-08 DIAGNOSIS — R82998 Other abnormal findings in urine: Secondary | ICD-10-CM

## 2023-10-08 DIAGNOSIS — E782 Mixed hyperlipidemia: Secondary | ICD-10-CM | POA: Diagnosis not present

## 2023-10-08 DIAGNOSIS — E291 Testicular hypofunction: Secondary | ICD-10-CM

## 2023-10-08 DIAGNOSIS — R7303 Prediabetes: Secondary | ICD-10-CM

## 2023-10-08 DIAGNOSIS — Z0000 Encounter for general adult medical examination without abnormal findings: Secondary | ICD-10-CM

## 2023-10-08 DIAGNOSIS — C61 Malignant neoplasm of prostate: Secondary | ICD-10-CM

## 2023-10-08 DIAGNOSIS — Z8546 Personal history of malignant neoplasm of prostate: Secondary | ICD-10-CM

## 2023-10-08 DIAGNOSIS — Z951 Presence of aortocoronary bypass graft: Secondary | ICD-10-CM

## 2023-10-08 DIAGNOSIS — Z1211 Encounter for screening for malignant neoplasm of colon: Secondary | ICD-10-CM

## 2023-10-08 DIAGNOSIS — N401 Enlarged prostate with lower urinary tract symptoms: Secondary | ICD-10-CM

## 2023-10-08 NOTE — Assessment & Plan Note (Signed)
 History of the same status post CABG x 3 patient stopped taking ezetimibe .  He is statin intolerant.  Encourage patient to take medication as prescribed follow-up cardiology as recommended

## 2023-10-08 NOTE — Assessment & Plan Note (Signed)
 Status post CABG x 3 patient is intolerant to statins but stopped ezetimibe .  Cholesterol has elevated.  Encourage patient to start back on ezetimibe 

## 2023-10-08 NOTE — Assessment & Plan Note (Signed)
 History of the same with intermittent symptoms.  Follow-up with urology

## 2023-10-08 NOTE — Assessment & Plan Note (Signed)
 History of same.  Continue working on healthy lifestyle modifications

## 2023-10-08 NOTE — Assessment & Plan Note (Signed)
 History of the same still on TRT.  PSA still elevated as it was last year.  Was followed by urology but has not followed up.  Encouraged follow-up with urology

## 2023-10-08 NOTE — Telephone Encounter (Signed)
 Dr. Gollan,  Keith Murillo in office he states that he has stopped taking the Zetia . His cholesterol is elevated and he is s/p CABG. Encouraged to resume Zetia  but wanted to keep you in the loop.  Thanks [name]

## 2023-10-08 NOTE — Progress Notes (Signed)
 Established Patient Office Visit  Subjective   Patient ID: Keith Murillo, male    DOB: 1950-06-05  Age: 73 y.o. MRN: [id number]  Chief Complaint  Patient presents with   Annual Exam    HPI  TRT/ED: Patient is followed by [facility] currently maintained on testosterone  100 mg weekly and tadalafil 20 mg daily as needed. Wolm Butter through [facility]   Thyroid : Currently maintained on NP thyroid  60 mg daily  NSTEMI/CAD: Maintained on Plavix  75 mg daily, baby aspirin , ezetimibe .  Patient is followed by cardiology.  Patient is intolerant to statins. States that he has stopped the zetia    OSA: does not use it currently. States that it was ineffective   Insomnia: Currently maintained on trazodone  25 to 50 mg nightly as needed. States that he no longer needs the trazodone .   Prostate cancer: Patient has history of the same was followed by urology. Has not followed up as requested  for complete physical and follow up of chronic conditions.  Immunizations: -Tetanus: Refused -Influenza: Refused -Shingles: Refused -Pneumonia: Refused  Diet: Fair diet. He is eating 3 meals a day. States that he is trying to curt back and will eat his firs tmeal around 12 noon. He will snack on ocassion. He does coffee water and sometimes jices  Exercise: No regular exercise. He is doing walking this week. Daily depending on the weather. He has been increasing and doing a couple of miles at a time   Eye exam: Dr. Estelle as needed not yearly.  Dental exam: Completes semi-annually. Needs updating.    Colonoscopy: Completed in 2008?  Followed by Dr. Kristie? He has not folowed up with  Lung Cancer Screening: N/A  PSA: Up-to-date with elevated PSA. He has a history of prostate cancer. States that he is having some pressure. He was being followed by uroloyg    Advanced directive: does not have       Review of Systems  Constitutional:  Negative for chills and  fever.  Respiratory:  Negative for shortness of breath.   Cardiovascular:  Negative for chest pain (tightness with exercise and then resolves) and leg swelling.  Gastrointestinal:  Negative for abdominal pain, blood in stool, constipation, diarrhea, nausea and vomiting.       BM daily   Genitourinary:  Negative for dysuria and hematuria.       Pressure with dark urine  States that he was having some stream issues.  Nocturia x1  Neurological:  Negative for dizziness, tingling and headaches.  Psychiatric/Behavioral:  Negative for hallucinations and suicidal ideas.       Objective:     BP 128/82   Pulse 76   Temp 97.9 F (36.6 C) (Oral)   Ht 5' 7 (1.702 m)   Wt 220 lb 12.8 oz (100.2 kg)   SpO2 97%   BMI 34.58 kg/m  BP Readings from Last 3 Encounters:  10/08/23 128/82  08/17/23 130/80  12/23/22 (!) 165/85   Wt Readings from Last 3 Encounters:  10/08/23 220 lb 12.8 oz (100.2 kg)  08/17/23 218 lb (98.9 kg)  12/23/22 233 lb (105.7 kg)   SpO2 Readings from Last 3 Encounters:  10/08/23 97%  12/23/22 96%  10/17/22 95%      Physical Exam Vitals and nursing note reviewed.  Constitutional:      Appearance: Normal appearance.  HENT:     Right Ear: Tympanic membrane, ear canal and external ear normal.     Left Ear: Tympanic  membrane, ear canal and external ear normal.     Mouth/Throat:     Mouth: Mucous membranes are moist.     Pharynx: Oropharynx is clear.  Eyes:     Extraocular Movements: Extraocular movements intact.     Pupils: Pupils are equal, round, and reactive to light.  Cardiovascular:     Rate and Rhythm: Normal rate and regular rhythm.     Pulses: Normal pulses.     Heart sounds: Normal heart sounds.  Pulmonary:     Effort: Pulmonary effort is normal.     Breath sounds: Normal breath sounds.  Abdominal:     General: Bowel sounds are normal. There is no distension.     Palpations: There is no mass.     Tenderness: There is no abdominal tenderness.      Hernia: No hernia is present.  Musculoskeletal:     Right lower leg: No edema.     Left lower leg: No edema.  Lymphadenopathy:     Cervical: No cervical adenopathy.  Skin:    General: Skin is warm.  Neurological:     General: No focal deficit present.     Mental Status: He is alert.     Deep Tendon Reflexes:     Reflex Scores:      Bicep reflexes are 2+ on the right side and 2+ on the left side.      Patellar reflexes are 2+ on the right side and 2+ on the left side.    Comments: Bilateral upper and lower extremity strength 5/5  Psychiatric:        Mood and Affect: Mood normal.        Behavior: Behavior normal.        Thought Content: Thought content normal.        Judgment: Judgment normal.      No results found for any visits on 10/08/23.    The ASCVD Risk score (Arnett DK, et al., 2019) failed to calculate for the following reasons:   Risk score cannot be calculated because patient has a medical history suggesting prior/existing ASCVD    Assessment & Plan:   Problem List Items Addressed This Visit       Endocrine   Hypogonadism in male   Patient testosterone  IM 100 mg weekly and followed by [facility].  Patient hemoglobin slightly elevated encourage patient to donate blood        Genitourinary   Prostate cancer (HCC)   History of the same still on TRT.  PSA still elevated as it was last year.  Was followed by urology but has not followed up.  Encouraged follow-up with urology      Benign prostatic hyperplasia with lower urinary tract symptoms   History of the same with intermittent symptoms.  Follow-up with urology        Other   Hyperlipidemia   History of the same status post CABG x 3 patient stopped taking ezetimibe .  He is statin intolerant.  Encourage patient to take medication as prescribed follow-up cardiology as recommended      S/P CABG x 3   Status post CABG x 3 patient is intolerant to statins but stopped ezetimibe .  Cholesterol has  elevated.  Encourage patient to start back on ezetimibe       Preventative health care - Primary   Discussed age-appropriate immunizations and screening plans.  Did review patient's personal, surgical, social, family histories.  Patient is up-to-date on all age-appropriate vaccinations he would  like.  Patient declined all vaccines.  Ambulatory referral to GI for CRC screening.  Recently PSA was performed and elevated.  Patient was given information at discharge about preventative healthcare maintenance with anticipatory guidance      Prediabetes   History of same.  Continue working on healthy lifestyle modifications      Dark urine   Concern for dark urine along with flow issues pending UA with reflexive culture.      Relevant Orders   Urinalysis w microscopic + reflex cultur   Other Visit Diagnoses       Screening for colon cancer       Relevant Orders   Ambulatory referral to Gastroenterology       Return in about 1 year (around 10/07/2024) for CPE and Labs.    Adina Crandall, NP

## 2023-10-08 NOTE — Assessment & Plan Note (Signed)
 Patient testosterone  IM 100 mg weekly and followed by [facility].  Patient hemoglobin slightly elevated encourage patient to donate blood

## 2023-10-08 NOTE — Assessment & Plan Note (Signed)
 Concern for dark urine along with flow issues pending UA with reflexive culture.

## 2023-10-08 NOTE — Telephone Encounter (Signed)
 Dr. Twylla,  Keith Murillo in office. He has elevated PSA x2. He mentions he was following with you in the past. Encouraged him to follow up with you. Wanted to keep you in the loop.  Thanks,  [name]

## 2023-10-08 NOTE — Patient Instructions (Signed)
 Nice to see you today I recommend that you start taking the Zetia  (cholesterol) medication again Also recommend that you follow up with Urolpgy I have referred you to Dr. Nola office

## 2023-10-08 NOTE — Assessment & Plan Note (Signed)
 Discussed age-appropriate immunizations and screening plans.  Did review patient's personal, surgical, social, family histories.  Patient is up-to-date on all age-appropriate vaccinations he would like.  Patient declined all vaccines.  Ambulatory referral to GI for CRC screening.  Recently PSA was performed and elevated.  Patient was given information at discharge about preventative healthcare maintenance with anticipatory guidance

## 2023-10-09 LAB — URINALYSIS W MICROSCOPIC + REFLEX CULTURE
Bilirubin Urine: NEGATIVE
Glucose, UA: NEGATIVE
Hyaline Cast: NONE SEEN /LPF
Ketones, ur: NEGATIVE
Leukocyte Esterase: NEGATIVE
Nitrites, Initial: NEGATIVE
Specific Gravity, Urine: 1.019 (ref 1.001–1.035)
Squamous Epithelial / HPF: NONE SEEN /HPF (ref <=5)
pH: 6.5 (ref 5.0–8.0)

## 2023-10-09 LAB — NO CULTURE INDICATED

## 2023-10-12 ENCOUNTER — Ambulatory Visit: Payer: Self-pay | Admitting: Nurse Practitioner

## 2023-10-12 NOTE — Telephone Encounter (Signed)
 Called patient and left message for call back.

## 2023-10-13 NOTE — Telephone Encounter (Signed)
 Called patient and left message for call back.

## 2023-10-14 NOTE — Telephone Encounter (Signed)
 Called patient and left message for call back.

## 2023-11-04 ENCOUNTER — Ambulatory Visit (INDEPENDENT_AMBULATORY_CARE_PROVIDER_SITE_OTHER): Admitting: Urology

## 2023-11-04 ENCOUNTER — Encounter: Payer: Self-pay | Admitting: Urology

## 2023-11-04 VITALS — BP 164/90 | HR 80 | Ht 67.0 in | Wt 220.0 lb

## 2023-11-04 DIAGNOSIS — R399 Unspecified symptoms and signs involving the genitourinary system: Secondary | ICD-10-CM

## 2023-11-04 DIAGNOSIS — C61 Malignant neoplasm of prostate: Secondary | ICD-10-CM

## 2023-11-04 DIAGNOSIS — R972 Elevated prostate specific antigen [PSA]: Secondary | ICD-10-CM | POA: Diagnosis not present

## 2023-11-04 DIAGNOSIS — R310 Gross hematuria: Secondary | ICD-10-CM | POA: Diagnosis not present

## 2023-11-04 LAB — MICROSCOPIC EXAMINATION: RBC, Urine: >30 /HPF — AB (ref 0–2)

## 2023-11-04 LAB — URINALYSIS, COMPLETE
Bilirubin, UA: NEGATIVE
Glucose, UA: NEGATIVE
Ketones, UA: NEGATIVE
Leukocytes,UA: NEGATIVE
Nitrite, UA: NEGATIVE
Specific Gravity, UA: 1.015 (ref 1.005–1.030)
Urobilinogen, Ur: 0.2 mg/dL (ref 0.2–1.0)
pH, UA: 6.5 (ref 5.0–7.5)

## 2023-11-04 MED ORDER — TAMSULOSIN HCL 0.4 MG PO CAPS: 0.4000 mg | ORAL_CAPSULE | Freq: Every day | ORAL | 0 refills | Status: DC

## 2023-11-04 NOTE — Patient Instructions (Signed)
 Scheduling number: [phone]

## 2023-11-04 NOTE — Progress Notes (Signed)
 11/04/2023 9:19 AM   Keith Murillo [birth year] [id number]  Referring provider: Wendee Lynwood HERO, NP [address] [place],  [place] [address]  Chief Complaint  Patient presents with   Prostate Cancer   Urologic history:  1. cT1c prostate cancer -DX 07/2017 -PSA 4.6; volume 62 g; 1/12 cores 3 +3 adenocarcinoma left lateral apex (23%) -Elected active surveillance -MRI 3/20; 57 cc volume; no suspicious high-grade lesions   2.  Hypogonadism -TRT discontinued   3.  Erectile dysfunction -Generic sildenafil , venous compression band  HPI: Keith Murillo is a 73 y.o. male presents for follow-up office visit.  Last seen 09/12/2020 PSA checked by PCP 10/02/2022 was 8.36 and 8.32 on 10/06/2023 56-month history of cloudy and darker appearing urine.  He passed a small stone over a month ago.  Is having intermittent gross hematuria and urinary frequency, urgency with occasional episodes of urge incontinence UA 10/08/2023 showed 10-20 RBC and moderate calcium  oxalate crystals  PSA trend    Prostate Specific Ag, Serum  Latest Ref Rng 0.0 - 4.0 ng/mL  11/21/2016 3.04  04/10/2017 3.9  05/29/2017 4.6  11/04/2017 4.1  03/26/2018 3.5  07/19/2018 2.8  02/28/2019 3.4  08/29/2019 2.8  03/02/2020 3.5  09/07/2020 3.1  10/02/2022 8.36  10/06/2022 8.32     PMH: Past Medical History:  Diagnosis Date   Cancer (HCC) 2018   prostate   Chest pain    Complication of anesthesia    fighting with waking up   Coronary artery disease    Heart attack (HCC)    Hypercholesterolemia    Hyperlipidemia    Hypertension 2004   Ischemic heart disease 09/2002   Known--with non-Q-wave myocardial infarction    Obesity    Preventative health care 10/02/2022   Sleep apnea 2004    Surgical History: Past Surgical History:  Procedure Laterality Date   CARDIAC CATHETERIZATION  11/07/2007   CORONARY ARTERY BYPASS GRAFT N/A 12/06/2021   Procedure: CORONARY ARTERY BYPASS GRAFTING (CABG) X 3 BYPASSES USING  LEFT INTERNAL MAMMARY ARTERY AND RIGHT LEG GREATER SAPHENOUS VEIN HARVESTED ENDOSCOPICALLY;  Surgeon: Kerrin Elspeth BROCKS, MD;  Location: [facility] OR;  Service: Open Heart Surgery;  Laterality: N/A;   HERNIA REPAIR     INGUINAL HERNIA REPAIR Right 09/22/2019   Procedure: RIGHT INGUINAL HERNIA WITH MESH;  Surgeon: Vanderbilt Ned, MD;  Location: [facility] OR;  Service: General;  Laterality: Right;  TAP BLOCK   LEFT HEART CATH AND CORONARY ANGIOGRAPHY N/A 11/29/2021   Procedure: LEFT HEART CATH AND CORONARY ANGIOGRAPHY;  Surgeon: Anner Alm ORN, MD;  Location: [facility] INVASIVE CV LAB;  Service: Cardiovascular;  Laterality: N/A;   TEE WITHOUT CARDIOVERSION N/A 03/16/2018   Procedure: TRANSESOPHAGEAL ECHOCARDIOGRAM (TEE);  Surgeon: Alveta Aleene PARAS, MD;  Location: [facility] ENDOSCOPY;  Service: Cardiovascular;  Laterality: N/A;   TEE WITHOUT CARDIOVERSION N/A 12/06/2021   Procedure: TRANSESOPHAGEAL ECHOCARDIOGRAM (TEE);  Surgeon: Kerrin Elspeth BROCKS, MD;  Location: [facility] OR;  Service: Open Heart Surgery;  Laterality: N/A;    Home Medications:  Allergies as of 11/04/2023       Reactions   Ace Inhibitors    cough   Beta Adrenergic Blockers    Fatigue   Codeine Other (See Comments)   Other reaction(s): Unknown   Lisinopril  Cough   Statins    myalgias        Medication List        Accurate as of November 04, 2023  9:19 AM. If you have any questions,  ask your nurse or doctor.          STOP taking these medications    PROBIOTIC PO Stopped by: Glendia JAYSON Barba   traZODone  50 MG tablet Commonly known as: DESYREL  Stopped by: Glendia JAYSON Barba       TAKE these medications    aspirin  EC 81 MG tablet Take 1 tablet (81 mg total) by mouth daily.   clopidogrel  75 MG tablet Commonly known as: PLAVIX  Take 1 tablet (75 mg total) by mouth daily.   [facility] CoQ-10 200 MG capsule Generic drug: Coenzyme Q10 Take 200 mg by mouth daily.   DHEA 25 MG Caps Take 25 mg by mouth daily.   ezetimibe  10 MG  tablet Commonly known as: ZETIA  Take 1 tablet (10 mg total) by mouth daily.   melatonin 5 MG Tabs Take 5 mg by mouth at bedtime.   NON FORMULARY Take 1 capsule by mouth daily. HAIOTB Circulation Syn3rgy (Beet Root, L-Arginine, Horse Chestnut)   NP Thyroid  60 MG tablet Generic drug: thyroid  Take 60 mg by mouth every morning.   OVER THE COUNTER MEDICATION Take 2 capsules by mouth daily. [place] grape capsules - 650 mg each - take 2 capsules by mouth daily   PREGNENOLONE PO Take 75 mg by mouth daily.   QUERCETIN PO Take 1 capsule by mouth daily. Bio-Quercetin   tadalafil 20 MG tablet Commonly known as: CIALIS Take 20 mg by mouth daily as needed for erectile dysfunction.   tamsulosin  0.4 MG Caps capsule Commonly known as: FLOMAX  Take 1 capsule (0.4 mg total) by mouth daily. Started by: Glendia JAYSON Barba   testosterone  cypionate 200 MG/ML injection Commonly known as: DEPOTESTOSTERONE CYPIONATE Inject 0.5 mLs into the skin once a week. Once a week   Turmeric 500 MG Tabs Take 500 mg by mouth daily.   Vitamin D -3 125 MCG (5000 UT) Tabs Take 5,000 Units by mouth daily.   VITAMIN K2 PO Take 150 mcg by mouth daily. MK-7   Zinc 50 MG Tabs Take 50 mg by mouth daily.        Allergies:  Allergies  Allergen Reactions   Ace Inhibitors     cough   Beta Adrenergic Blockers     Fatigue   Codeine Other (See Comments)    Other reaction(s): Unknown   Lisinopril  Cough   Statins     myalgias    Family History: Family History  Problem Relation Age of Onset   Cancer Mother        larynx   Hearing loss Father    Varicose Veins Father    Cancer Maternal Grandmother        intestional   Parkinson's disease Maternal Grandfather    Stroke Maternal Grandfather    Stroke Paternal Grandmother    Heart attack Paternal Grandfather     Social History:  reports that he has never smoked. He has never used smokeless tobacco. He reports current alcohol use of about 2.0 standard  drinks of alcohol per week. He reports that he does not use drugs.   Physical Exam: BP (!) 164/90   Pulse 80   Ht 5' 7 (1.702 m)   Wt 220 lb (99.8 kg)   BMI 34.46 kg/m   Constitutional:  Alert, No acute distress. HEENT: [place] AT Respiratory: Normal respiratory effort, no increased work of breathing. GU: Prostate 50 g, smooth without nodules Psychiatric: Normal mood and affect.  Laboratory Data:  Urinalysis Pending    Assessment & Plan:  1.  Gross hematuria AUA risk stratification: High We discussed the recommended evaluation of high risk hematuria which consists of CT urogram and cystoscopy.  The procedures were discussed in detail and he/she has elected to proceed with further evaluation All questions were answered CTU order placed and will review prior to cystoscopy  2.  Prostate cancer PSA has doubled but was stable over the last year Recommend further evaluation with prostate MRI  3.  Lower urinary tract symptoms Trial tamsulosin  0.4 mg daily   Glendia JAYSON Barba, MD  [facility] [address] [place], [place] [address] [phone]

## 2023-11-13 ENCOUNTER — Other Ambulatory Visit

## 2023-11-13 ENCOUNTER — Other Ambulatory Visit: Payer: Self-pay

## 2023-11-13 ENCOUNTER — Ambulatory Visit
Admission: RE | Admit: 2023-11-13 | Discharge: 2023-11-13 | Disposition: A | Discharge status: Home / Self Care | Source: Ambulatory Visit | Attending: Urology | Admitting: Urology

## 2023-11-13 DIAGNOSIS — R319 Hematuria, unspecified: Secondary | ICD-10-CM | POA: Diagnosis not present

## 2023-11-13 DIAGNOSIS — C61 Malignant neoplasm of prostate: Secondary | ICD-10-CM | POA: Insufficient documentation

## 2023-11-13 DIAGNOSIS — N138 Other obstructive and reflux uropathy: Secondary | ICD-10-CM

## 2023-11-13 DIAGNOSIS — N200 Calculus of kidney: Secondary | ICD-10-CM | POA: Diagnosis not present

## 2023-11-13 MED ORDER — IOHEXOL 300 MG/ML  SOLN
100.0000 mL | Freq: Once | INTRAMUSCULAR | Status: AC | PRN
  Administered 2023-11-13: 100 mL via INTRAVENOUS

## 2023-11-18 ENCOUNTER — Ambulatory Visit: Payer: Self-pay | Admitting: Urology

## 2023-11-20 ENCOUNTER — Ambulatory Visit
Admission: RE | Admit: 2023-11-20 | Discharge: 2023-11-20 | Disposition: A | Discharge status: Home / Self Care | Source: Ambulatory Visit | Attending: Urology | Admitting: Urology

## 2023-11-20 DIAGNOSIS — N4289 Other specified disorders of prostate: Secondary | ICD-10-CM | POA: Diagnosis not present

## 2023-11-20 DIAGNOSIS — K5730 Diverticulosis of large intestine without perforation or abscess without bleeding: Secondary | ICD-10-CM | POA: Diagnosis not present

## 2023-11-20 DIAGNOSIS — C61 Malignant neoplasm of prostate: Secondary | ICD-10-CM | POA: Insufficient documentation

## 2023-11-20 MED ORDER — GADOBUTROL 1 MMOL/ML IV SOLN
10.0000 mL | Freq: Once | INTRAVENOUS | Status: AC | PRN
  Administered 2023-11-20: 10 mL via INTRAVENOUS

## 2023-11-24 LAB — STONE ANALYSIS
Calcium Oxalate Dihydrate: 90 %
Calcium Phosphate (Hydroxyl): 10 %
Weight Calculi: 21 mg

## 2023-11-30 ENCOUNTER — Telehealth: Payer: Self-pay | Admitting: Cardiovascular Disease

## 2023-11-30 NOTE — Telephone Encounter (Signed)
   Pre-operative Risk Assessment    Patient Name: Keith Murillo  DOB: May 10, 1950 MRN: [id number]   Date of last office visit: 10/17/22 Date of next office visit: n/a   Request for Surgical Clearance    Procedure:  fusion biopsy  Date of Surgery:  Clearance 12/24/23                                Surgeon:  debara Surgeon's Group or Practice Name:  [facility] Phone number:  [phone] Fax number:  [phone]   Type of Clearance Requested:   - Pharmacy:  Hold Aspirin  81 mg and plavix  5 to 7 days prior   Type of Anesthesia:  Not Indicated   Additional requests/questions:    [name]   11/30/2023, 3:29 PM

## 2023-12-01 ENCOUNTER — Other Ambulatory Visit: Payer: Self-pay | Admitting: Urology

## 2023-12-01 NOTE — Telephone Encounter (Signed)
   Patient Name: Keith Murillo  DOB: Apr 26, 1950 MRN: [id number]  Primary Cardiologist: Evalene Lunger, MD  Chart reviewed as part of pre-operative protocol coverage. Per Dr. Gollan, Okay to hold Plavix  5 days prior to procedure Would prefer he stay on aspirin  81 daily given extensive coronary disease Thx TGollan  Please resume Plavix  as soon as possible postprocedure, at the discretion of the surgeon.   I will route this recommendation to the requesting party via Epic fax function and remove from pre-op pool.  Please call with questions.  Damien JAYSON Braver, NP 12/01/2023, 1:54 PM

## 2023-12-06 IMAGING — DX DG CHEST 2V
2 series · 2 of 2 positions shown · non-contrast
Comparison: 03/12/2018

CLINICAL DATA: Fever, cough, nausea, diarrhea, and chest pain for 5
days

EXAM:
CHEST - 2 VIEW

[chest pa]
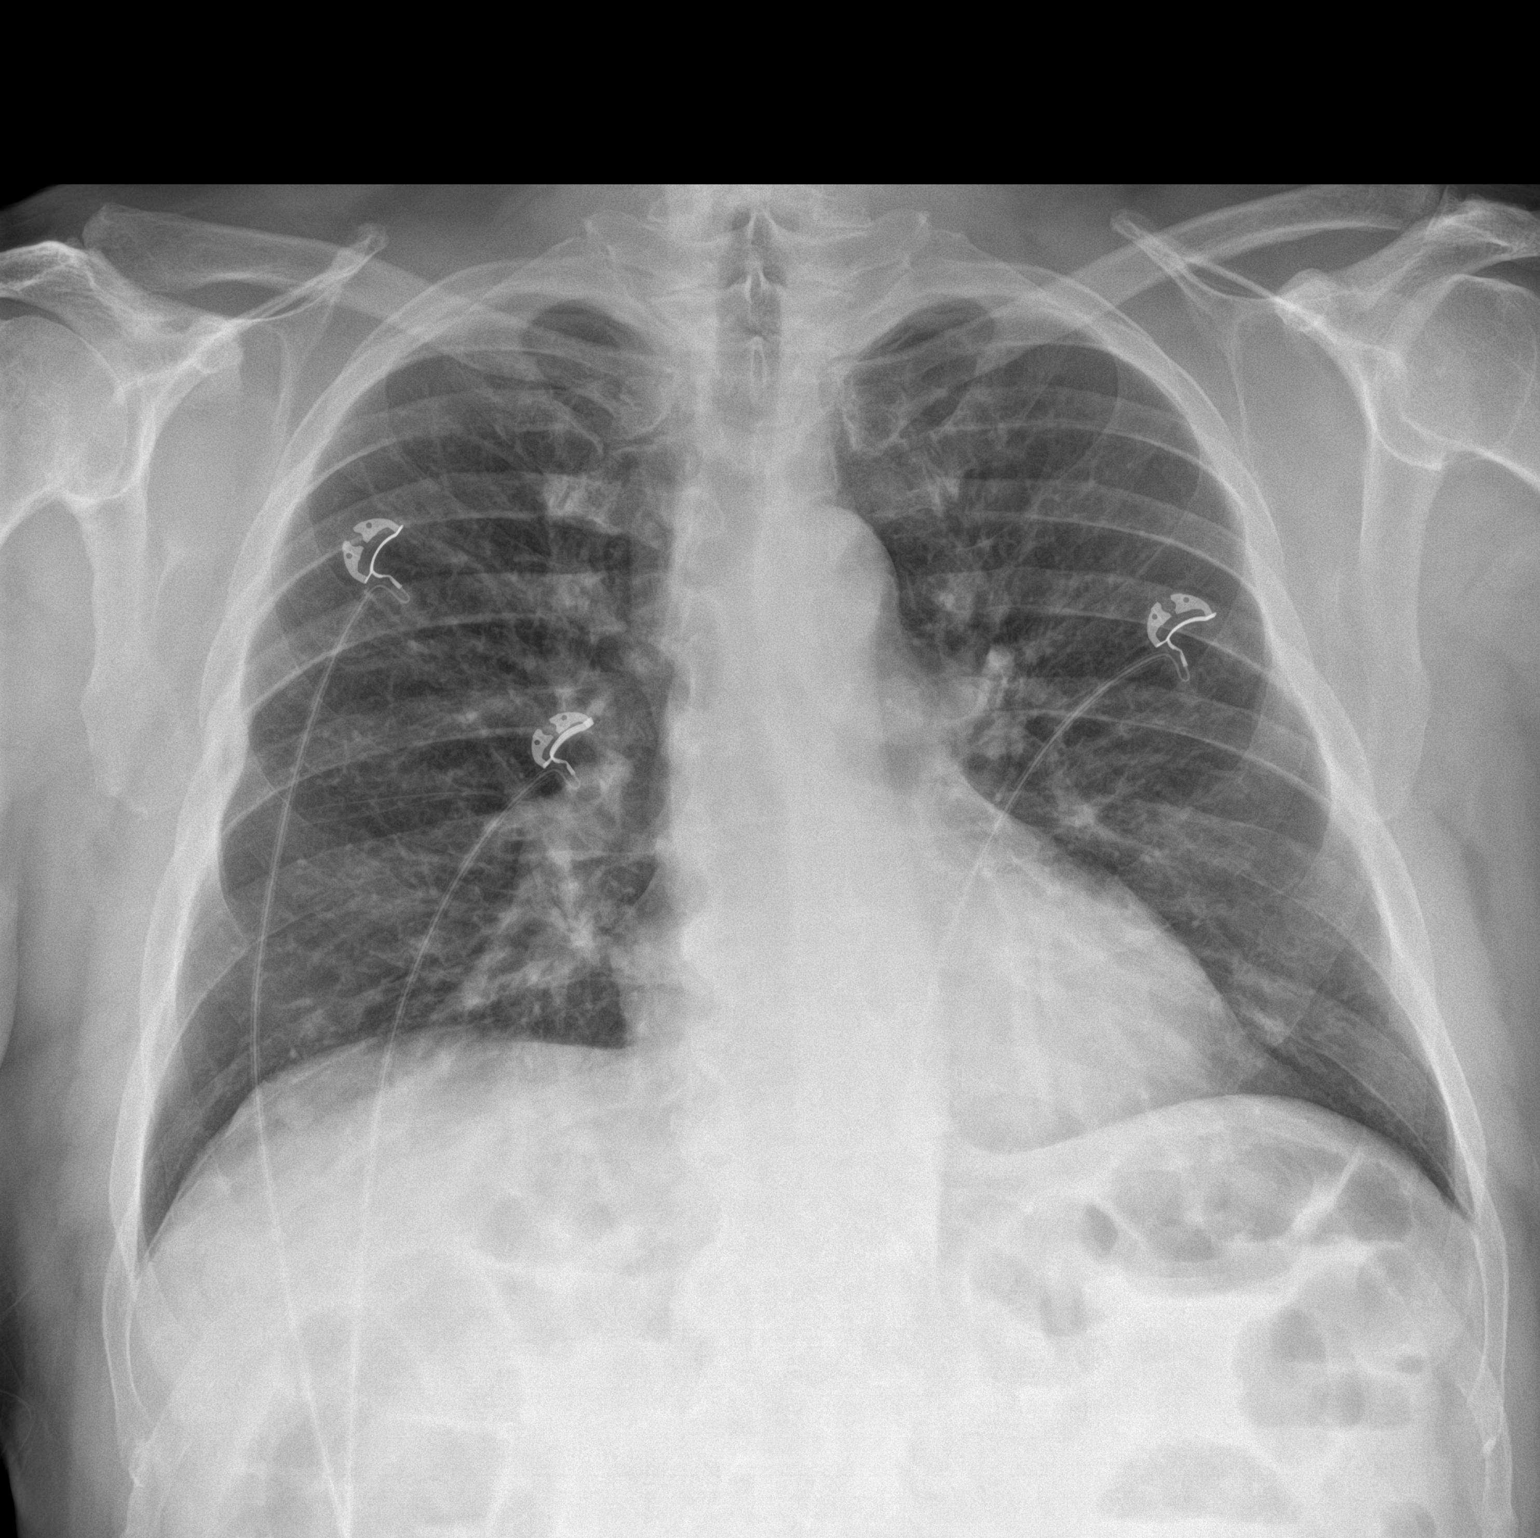

[chest lat]
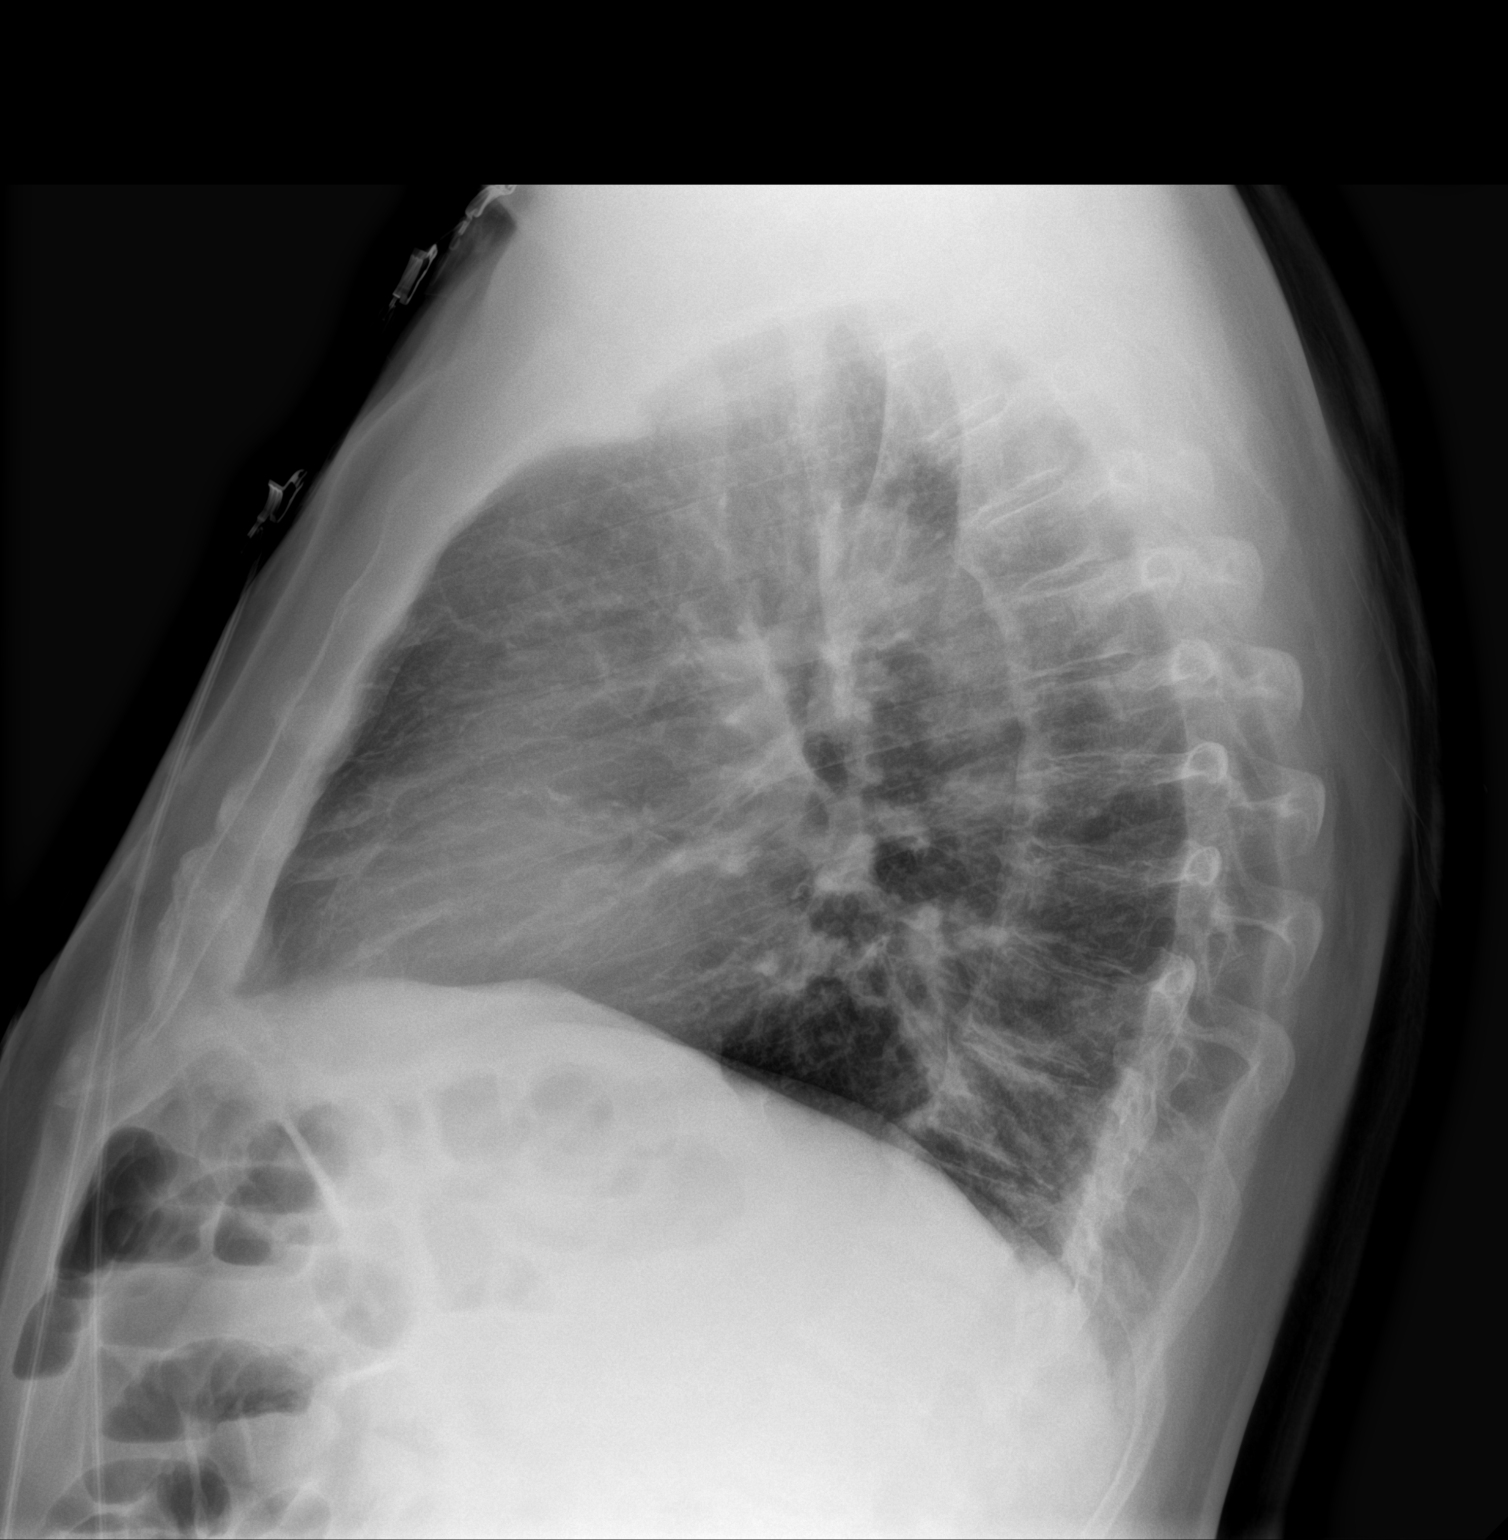

[2 of 2 positions shown; findings below may reference images not displayed]

FINDINGS: The heart size and mediastinal contours are within normal limits.
Both lungs are clear. The visualized skeletal structures are
unremarkable.
IMPRESSION: No active cardiopulmonary disease.

## 2023-12-06 IMAGING — CT CT CHEST-ABD-PELV W/ CM
2 of 5 series · 14 of 46 positions shown, 16 images · IV contrast (APPLIED)
Comparison: None.

CLINICAL DATA: Respiratory illness, nondiagnostic xray. flulike
symptons, cough, fever , diarrhea x 1 week.

EXAM:
CT CHEST, ABDOMEN, AND PELVIS WITH CONTRAST
TECHNIQUE: Multidetector CT imaging of the chest, abdomen and pelvis was
performed following the standard protocol during bolus
administration of intravenous contrast.
CONTRAST:  100mL OMNIPAQUE IOHEXOL 300 MG/ML  SOLN

[Series 2: cap with · axial · 0.88mm/px · z∈[+729,+1264]mm · 11 of 129 slices shown, 13 images]
[im 11/129  soft-tissue]
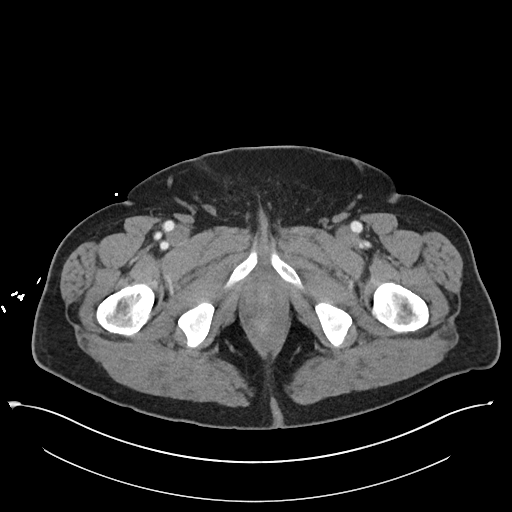
[im 11/129  bone]
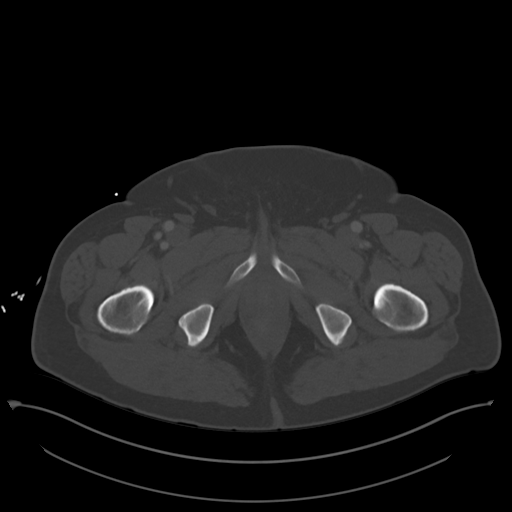
[im 22/129  soft-tissue]
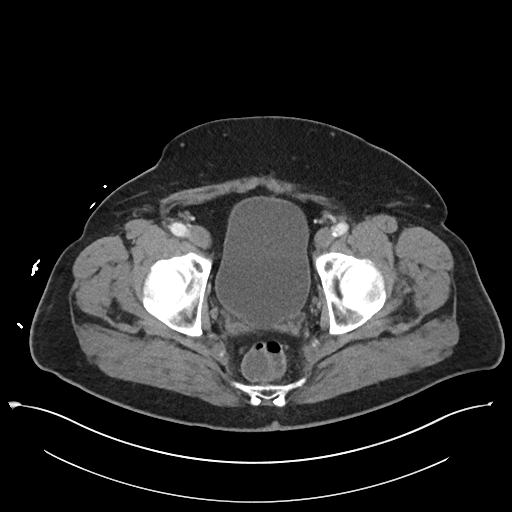
[im 33/129  soft-tissue]
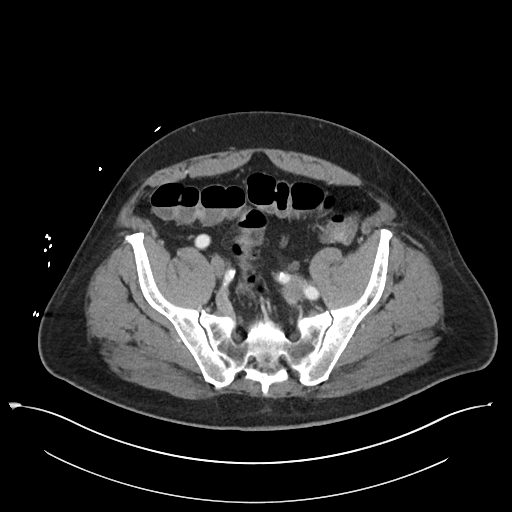
[im 43/129  soft-tissue]
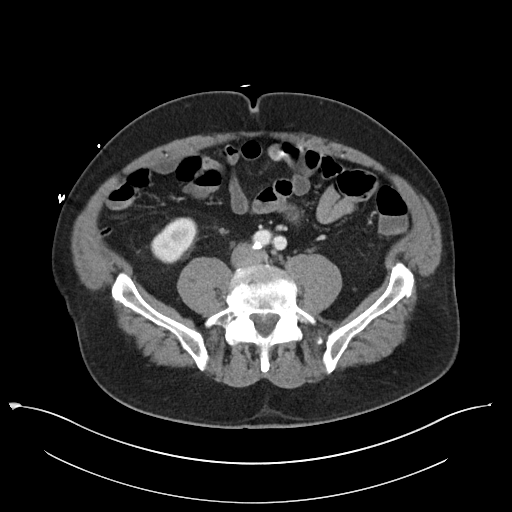
[im 54/129  soft-tissue]
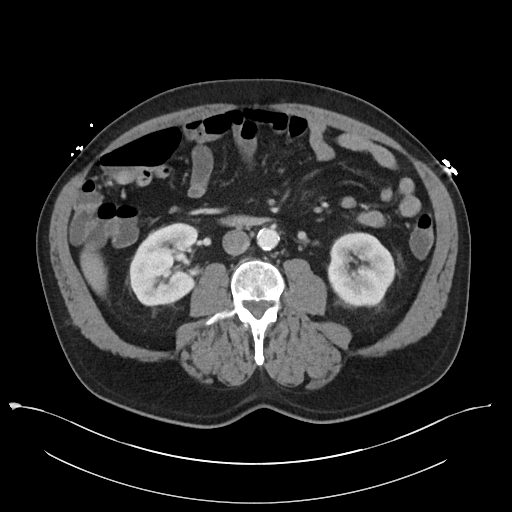
[im 65/129  soft-tissue]
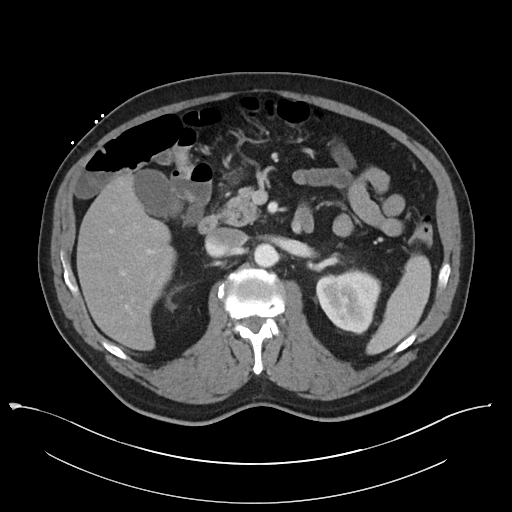
[im 75/129  soft-tissue]
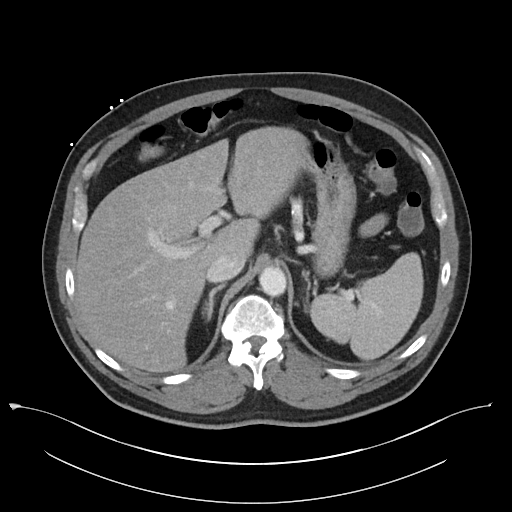
[im 86/129  soft-tissue]
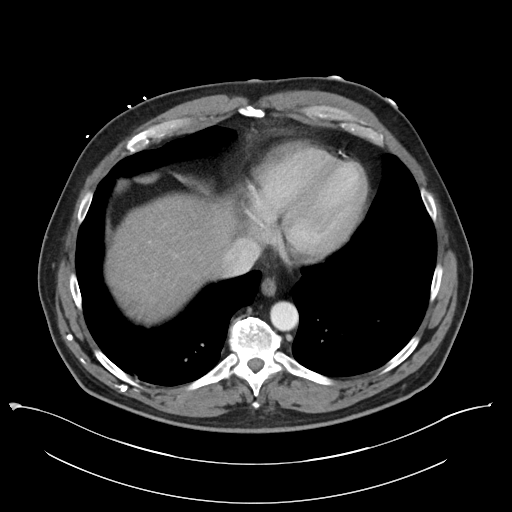
[im 97/129  soft-tissue]
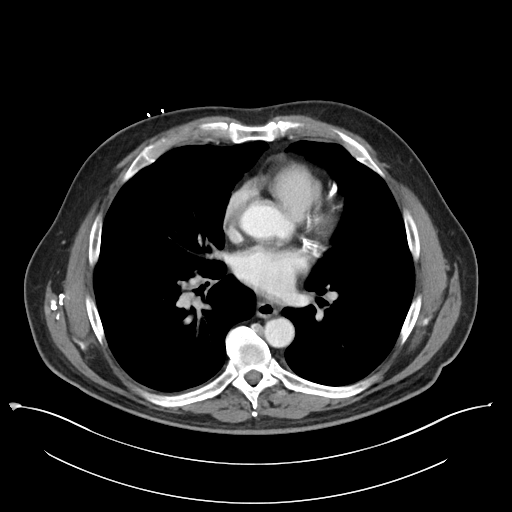
[im 97/129  bone]
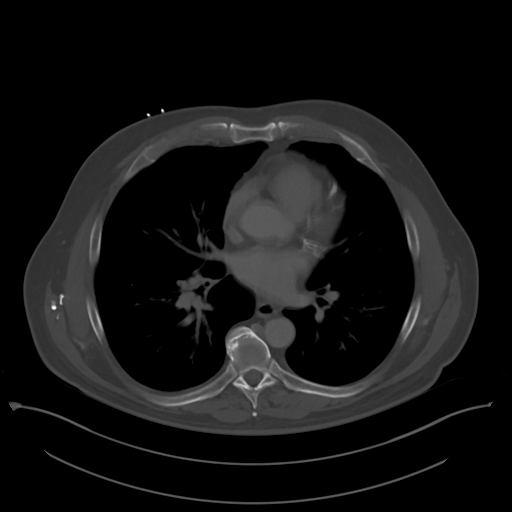
[im 107/129  soft-tissue]
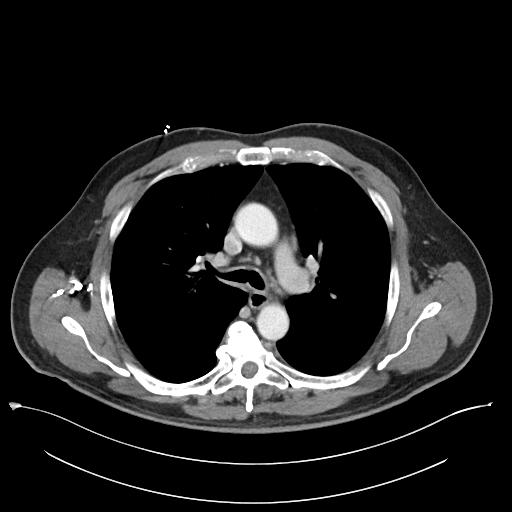
[im 118/129  soft-tissue]
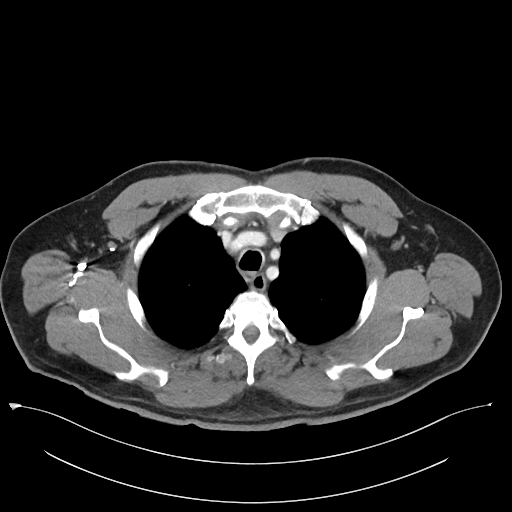

[Series 5: coronal · coronal · 1.01mm/px · 3 of 110 slices shown]
[im 37/110  soft-tissue]
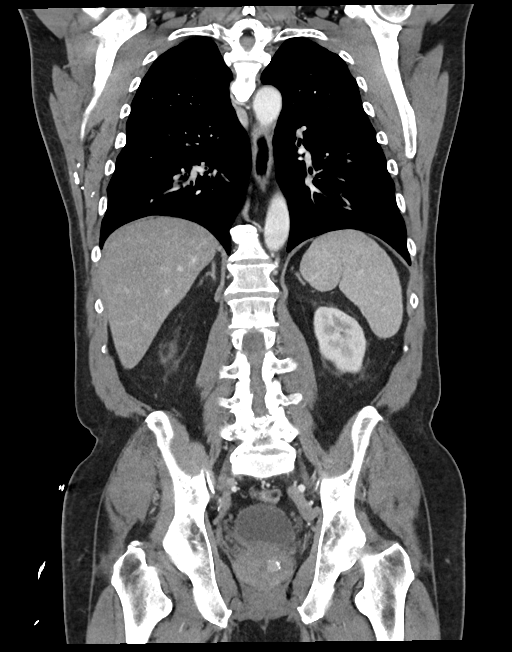
[im 49/110  soft-tissue]
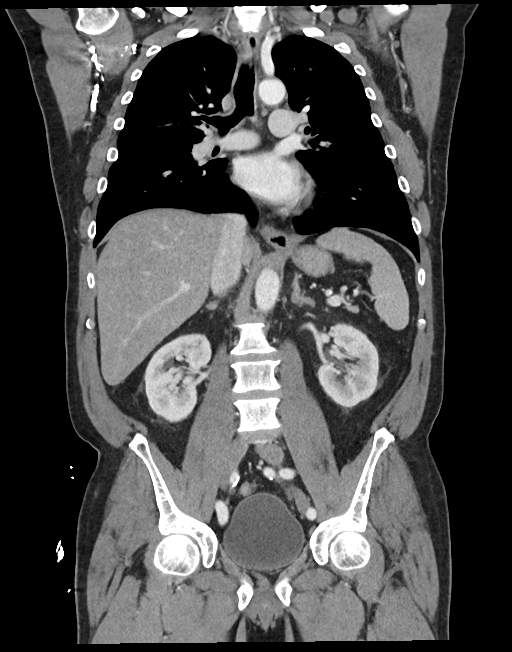
[im 61/110  soft-tissue]
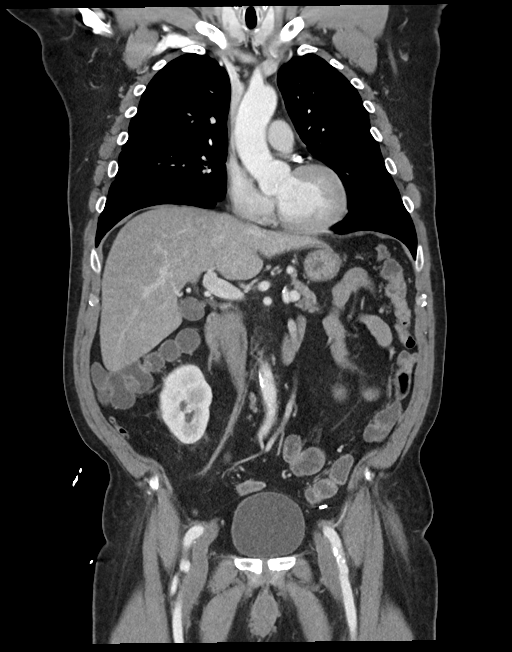

[14 of 46 positions shown; findings below may reference images not displayed]

FINDINGS: CT CHEST FINDINGS

Cardiovascular: Normal heart size. No significant pericardial
effusion. The thoracic aorta is normal in caliber. No
atherosclerotic plaque of the thoracic aorta. Three-vessel coronary
artery calcifications.

Mediastinum/Nodes: No enlarged mediastinal, hilar, or axillary lymph
nodes. Thyroid gland, trachea, and esophagus demonstrate no
significant findings. Query tiny hiatal hernia.

Lungs/Pleura: Right lower lobe peribronchovascular tree-in-bud
nodularity. To slightly lesser degree similar findings within the
left lower lobe. No pulmonary mass. No pleural effusion. No
pneumothorax. Diffuse mild bronchial wall thickening.

Musculoskeletal:

No chest wall abnormality.

No suspicious lytic or blastic osseous lesions. No acute displaced
fracture. Multilevel degenerative changes of the spine.

CT ABDOMEN PELVIS FINDINGS

Hepatobiliary: No focal liver abnormality. No gallstones,
gallbladder wall thickening, or pericholecystic fluid. No biliary
dilatation.

Pancreas: No focal lesion. Normal pancreatic contour. No surrounding
inflammatory changes. No main pancreatic ductal dilatation.

Spleen: Normal in size without focal abnormality.

Adrenals/Urinary Tract:

No adrenal nodule bilaterally.

Bilateral kidneys enhance symmetrically. Subcentimeter hypodensities
are too small to characterize.

No hydronephrosis. No hydroureter.

The urinary bladder is unremarkable.

Stomach/Bowel: Stomach is within normal limits. No evidence of bowel
wall thickening or dilatation. Appendix appears normal.

Vascular/Lymphatic: No abdominal aorta or iliac aneurysm. Moderate
atherosclerotic plaque of the aorta and its branches. No abdominal,
pelvic, or inguinal lymphadenopathy.

Reproductive: The prostate is enlarged measuring up to 6 cm.

Other: No intraperitoneal free fluid. No intraperitoneal free gas.
No organized fluid collection.

Musculoskeletal:

No abdominal wall hernia or abnormality.

No suspicious lytic or blastic osseous lesions. No acute displaced
fracture. Multilevel degenerative changes of the spine.
IMPRESSION: 1. Bilateral lower lobe, right greater than left, findings
suggestive of atypical infection. No follow-up needed if patient is
low-risk (and has no known or suspected primary neoplasm).
Non-contrast chest CT can be considered in 12 months if patient is
high-risk. This recommendation follows the consensus statement:
Guidelines for Management of Incidental Pulmonary Nodules Detected
2. Prostatomegaly.
3.  Aortic Atherosclerosis ([id number]-0CB.B).

## 2023-12-11 ENCOUNTER — Encounter: Payer: Self-pay | Admitting: *Deleted

## 2023-12-16 ENCOUNTER — Ambulatory Visit: Admitting: Urology

## 2023-12-16 ENCOUNTER — Encounter: Payer: Self-pay | Admitting: Urology

## 2023-12-16 VITALS — BP 167/88 | HR 80 | Ht 67.0 in | Wt 218.0 lb

## 2023-12-16 DIAGNOSIS — N138 Other obstructive and reflux uropathy: Secondary | ICD-10-CM

## 2023-12-16 DIAGNOSIS — R310 Gross hematuria: Secondary | ICD-10-CM | POA: Diagnosis not present

## 2023-12-16 DIAGNOSIS — C61 Malignant neoplasm of prostate: Secondary | ICD-10-CM | POA: Diagnosis not present

## 2023-12-16 LAB — URINALYSIS, COMPLETE
Bilirubin, UA: NEGATIVE
Glucose, UA: NEGATIVE
Leukocytes,UA: NEGATIVE
Nitrite, UA: NEGATIVE
Specific Gravity, UA: 1.025 (ref 1.005–1.030)
Urobilinogen, Ur: 0.2 mg/dL (ref 0.2–1.0)
pH, UA: 6 (ref 5.0–7.5)

## 2023-12-16 LAB — MICROSCOPIC EXAMINATION: RBC, Urine: >30 /HPF — AB (ref 0–2)

## 2023-12-16 NOTE — Patient Instructions (Signed)
 Transrectal Prostate Biopsy/Fusion Biopsy Patient Education and Post Procedure Instructions    -Definition A prostate biopsy is the removal of a small amount of tissue from the prostate gland. The tissue is examined to determine whether there is cancer.  -Reasons for Procedure A prostate biopsy is usually done after an abnormal finding by: Digital rectal exam Prostate specific antigen (PSA) blood test A prostate biopsy is the only way to find out if cancer cells are present.  -Possible Complications Problems from the procedure are rare, but all procedures have some risk including: Infection Bruising or lengthy bleeding from the rectum, or in urine or semen Difficulty urinating Reactions to anesthesia Factors that may increase the risk of complications include: Smoking History of bleeding disorders or easy bruising Use of any medications, over-the-counter medications, or herbal supplements Sensitivity or allergy to latex, medications, or anesthesia.  -Prior to Procedure Talk to your doctor about your medications. Blood thinning medications including aspirin  should be stopped 1 week prior to procedure. If prescribed by your cardiologist we may need approval before stopping medications. Use a Fleets enema 2 hours before the procedure. Can be purchased at your pharmacy. Antibiotics will be administered in the clinic prior to procedure.  Please make sure you eat a light meal prior to coming in for your appointment. This can help prevent lightheadedness during the procedure and upset stomach from antibiotics. Please bring someone with you to the procedure to drive you home.  -Anesthesia Transrectal biopsy: Local anesthesia--Just the area that is being operated on is numbed using an injectable anesthetic.  -Description of the Procedure Transrectal biopsy--Your doctor will insert a small ultrasound device into the rectum. This device will produce sound waves to create an image of the  prostate. These images will help guide placement of the needle. Your doctor will then insert the needle through the wall of the rectum and into the prostate gland. The procedure should take approximately 15-30 minutes.  -Will It Hurt? You may have discomfort and soreness at the biopsy site. Pain and discomfort after the procedure can be managed with medications.  -Postoperative Care When you return home after the procedure, do the following to help ensure a smooth recovery: Stay hydrated. Drink plenty of fluids for the next few days. Avoid difficult physical activity the day and evening of the procedure. Keep in mind that you may see blood in your urine, stool, or semen for several days. Resume any medications that were stopped when you are advised to do so.  After the sample is taken, it will be sent to a pathologist for examination under a microscope. This doctor will analyze the sample for cancer. You will be scheduled for an appointment to discuss results. If cancer is present, your doctor will work with you to develop a treatment plan.   -Call Your Doctor or Seek Immediate Medical Attention It is important to monitor your recovery. Alert your doctor to any problems. If any of the following occur, call your doctor or go to the emergency room: Fever 100.5 or greater within 1 week post procedure go directly to ER Call the office for: Blood in the urine more than 1 week or in semen for more than 6 weeks post-biopsy Pain that you cannot control with the medications you have been given Pain, burning, urgency, or frequency of urination Cough, shortness of breath, or chest pain- if severe go to ER Heavy rectal bleeding or bleeding that lasts more than 1 week after the biopsy If you have  any questions or concerns please contact our office at [phone]  [facility]  [address]  [place], [place] [address] [phone]

## 2023-12-16 NOTE — Progress Notes (Signed)
° °  12/16/23  CC:  Chief Complaint  Patient presents with   Cysto    HPI: 73 y.o. male with intermittent gross hematuria and persistent microhematuria.  CTU with nonobstructing renal calculi and BPH.  UA today >30 RBCs on microscopy.  Has an elevated PSA with abnormal prostate MRI and is scheduled for MR fusion biopsy next week  Blood pressure (!) 167/88, pulse 80, height 5' 7 (1.702 m), weight 218 lb (98.9 kg). NED. A&Ox3.   No respiratory distress   Abd soft, NT, ND Normal phallus with bilateral descended testicles  Cystoscopy Procedure Note  Patient identification was confirmed, informed consent was obtained, and patient was prepped using Betadine solution.  Lidocaine  jelly was administered per urethral meatus.     Pre-Procedure: - Inspection reveals a normal caliber urethral meatus.  Procedure: The flexible cystoscope was introduced without difficulty - No urethral strictures/lesions are present. - Prominent lateral lobe enlargement prostate with hypervascularity - Normal bladder neck - Bilateral ureteral orifices identified - Bladder mucosa  reveals no ulcers, tumors, or lesions - No bladder stones - Mild trabeculation  Retroflexion shows no intravesical median lobe or lesion   Post-Procedure: - Patient tolerated the procedure well  Assessment/ Plan: No bladder mucosal abnormalities Nephrolithiasis Scheduled MR fusion prostate biopsy next week Rx Valium  sent to pharmacy preprocedure.  He will have a driver    Glendia JAYSON Barba, MD

## 2023-12-23 ENCOUNTER — Telehealth: Payer: Self-pay

## 2023-12-23 NOTE — Addendum Note (Signed)
 Addended by: TWYLLA GLENDIA BROCKS on: 12/23/2023 05:54 PM   Modules accepted: Orders

## 2023-12-23 NOTE — Telephone Encounter (Signed)
 Pt aware [facility] will send in Valium  rx. If he has not heard from [facility] by 11am He will contact office.

## 2023-12-23 NOTE — Telephone Encounter (Signed)
 Pt LM on triage line requesting Valium  for fusion bx.   Pls advise.

## 2023-12-24 ENCOUNTER — Ambulatory Visit (INDEPENDENT_AMBULATORY_CARE_PROVIDER_SITE_OTHER): Admitting: Urology

## 2023-12-24 VITALS — BP 150/71 | HR 63

## 2023-12-24 DIAGNOSIS — C61 Malignant neoplasm of prostate: Secondary | ICD-10-CM | POA: Diagnosis not present

## 2023-12-24 DIAGNOSIS — Z2989 Encounter for other specified prophylactic measures: Secondary | ICD-10-CM

## 2023-12-24 MED ORDER — LEVOFLOXACIN 500 MG PO TABS
500.0000 mg | ORAL_TABLET | Freq: Once | ORAL | Status: AC
  Administered 2023-12-24: 15:00:00 500 mg via ORAL

## 2023-12-24 MED ORDER — GENTAMICIN SULFATE 40 MG/ML IJ SOLN
80.0000 mg | Freq: Once | INTRAMUSCULAR | Status: AC
  Administered 2023-12-24: 14:00:00 80 mg via INTRAMUSCULAR

## 2023-12-24 NOTE — Addendum Note (Signed)
 Addended by: ELOUISE SANTA BROCKS on: 12/24/2023 03:02 PM   Modules accepted: Orders

## 2023-12-24 NOTE — Progress Notes (Signed)
° °  12/24/2023  Indication: Elevated PSA, 8.3, abnormal prostate MRI, low risk prostate cancer on active surveillance  MRI Fusion Prostate Biopsy Procedure   Informed consent was obtained, and we discussed the risks of bleeding and infection/sepsis. A time out was performed to ensure correct patient identity.  Pre-Procedure: - Last PSA Level: 8.3 - Gentamicin  and levaquin  given for antibiotic prophylaxis -Prostate measured 90 g on MRI, PSA density 0.09 -ROI#1 hypoechoic  Procedure: - Prostate block performed using 10 cc 1% lidocaine   - MRI fusion biopsy was performed:  ROI #1: 4 cores ROI #2: 4 cores  - Total of 8 cores taken  Post-Procedure: - Patient tolerated the procedure well - He was counseled to seek immediate medical attention if experiences significant bleeding, fevers, or severe pain - Return in one week to discuss biopsy results  Assessment/ Plan: Will follow up in 1-2 weeks to discuss pathology with Dr. Twylla Redell Burnet, MD 12/24/2023

## 2023-12-24 NOTE — Patient Instructions (Signed)

## 2023-12-25 ENCOUNTER — Other Ambulatory Visit: Payer: Self-pay | Admitting: Cardiovascular Disease

## 2023-12-29 LAB — PROSTATE CORE NEEDLE BIOPSY

## 2024-01-06 ENCOUNTER — Telehealth: Payer: Self-pay | Admitting: Urology

## 2024-01-06 ENCOUNTER — Other Ambulatory Visit: Payer: Self-pay | Admitting: Urology

## 2024-01-06 ENCOUNTER — Encounter: Payer: Self-pay | Admitting: Urology

## 2024-01-06 DIAGNOSIS — C61 Malignant neoplasm of prostate: Secondary | ICD-10-CM

## 2024-01-06 NOTE — Telephone Encounter (Signed)
 I contacted [name] to discuss his prostate biopsy report on 01/05/2024 at 1834.  No postbiopsy complaints.  History low risk prostate cancer on active surveillance.  Confirmatory biopsy was performed 12/24/2022.  He underwent 4 cores at each ROI.  ROI 1 did show Gleason 3+4 adenocarcinoma (20%).  ROI 2 biopsies showed benign prostate tissue.  We discussed risk stratification now considered favorable intermediate.  CT did show possible evidence of extracapsular extension at the left posterolateral apex and neurovascular bundle  Recommend radiation oncology appointment.  Will also send for GPS

## 2024-01-11 ENCOUNTER — Other Ambulatory Visit: Payer: Self-pay | Admitting: Urology

## 2024-01-11 NOTE — Telephone Encounter (Signed)
 Sent today  01/11/2024

## 2024-01-22 ENCOUNTER — Telehealth: Payer: Self-pay

## 2024-01-22 NOTE — Telephone Encounter (Signed)
 Pt stopped by the clinic.   He informed me that he is seeing is internal med Dr in [place] and this provider is requesting additional information.   Advised pt to have provider send over a MRR and we are happy to fax him all infor needed.   Gave pt a bus card with fax number for clinic. Pt voiced understanding.

## 2024-01-22 NOTE — Telephone Encounter (Signed)
 Pt LM on triage line requesting a specimen ID# for his prostate bx???   LMTRC- need more info.

## 2024-01-25 ENCOUNTER — Encounter: Payer: Self-pay | Admitting: Urology

## 2024-01-26 ENCOUNTER — Ambulatory Visit
Admission: RE | Admit: 2024-01-26 | Discharge: 2024-01-26 | Disposition: A | Discharge status: Home / Self Care | Source: Ambulatory Visit | Attending: Radiation Oncology | Admitting: Radiation Oncology

## 2024-01-26 ENCOUNTER — Encounter: Payer: Self-pay | Admitting: Radiation Oncology

## 2024-01-26 VITALS — BP 159/87 | HR 64 | Temp 96.8°F | Resp 15 | Ht 67.0 in | Wt 212.5 lb

## 2024-01-26 DIAGNOSIS — C61 Malignant neoplasm of prostate: Secondary | ICD-10-CM | POA: Insufficient documentation

## 2024-01-26 DIAGNOSIS — Z7982 Long term (current) use of aspirin: Secondary | ICD-10-CM | POA: Insufficient documentation

## 2024-01-26 DIAGNOSIS — E7800 Pure hypercholesterolemia, unspecified: Secondary | ICD-10-CM | POA: Insufficient documentation

## 2024-01-26 DIAGNOSIS — Z9079 Acquired absence of other genital organ(s): Secondary | ICD-10-CM | POA: Insufficient documentation

## 2024-01-26 DIAGNOSIS — Z809 Family history of malignant neoplasm, unspecified: Secondary | ICD-10-CM | POA: Diagnosis not present

## 2024-01-26 DIAGNOSIS — I10 Essential (primary) hypertension: Secondary | ICD-10-CM | POA: Diagnosis not present

## 2024-01-26 DIAGNOSIS — E079 Disorder of thyroid, unspecified: Secondary | ICD-10-CM | POA: Diagnosis not present

## 2024-01-26 DIAGNOSIS — I259 Chronic ischemic heart disease, unspecified: Secondary | ICD-10-CM | POA: Diagnosis not present

## 2024-01-26 DIAGNOSIS — E785 Hyperlipidemia, unspecified: Secondary | ICD-10-CM | POA: Insufficient documentation

## 2024-01-26 DIAGNOSIS — I252 Old myocardial infarction: Secondary | ICD-10-CM | POA: Diagnosis not present

## 2024-01-26 DIAGNOSIS — I2510 Atherosclerotic heart disease of native coronary artery without angina pectoris: Secondary | ICD-10-CM | POA: Diagnosis not present

## 2024-01-26 DIAGNOSIS — Z79890 Hormone replacement therapy (postmenopausal): Secondary | ICD-10-CM | POA: Insufficient documentation

## 2024-01-26 DIAGNOSIS — R319 Hematuria, unspecified: Secondary | ICD-10-CM | POA: Insufficient documentation

## 2024-01-26 NOTE — Consult Note (Signed)
 " NEW PATIENT EVALUATION  Name: Keith Murillo  MRN: [id number]  Date:   01/26/2024     DOB: 1950/06/24   This 74 y.o. male patient presents to the clinic for initial evaluation of clinical stage IIb (cT2b N0 M0) adenocarcinoma the prostate Gleason score of 7 (3+4) presenting with a PSA of.  8.3  REFERRING PHYSICIAN: Stoioff, Glendia BROCKS, MD  CHIEF COMPLAINT:  Chief Complaint  Patient presents with   Prostate Cancer    DIAGNOSIS: The encounter diagnosis was Prostate cancer (HCC).   PREVIOUS INVESTIGATIONS:  MRI scan reviewed Pathology reports reviewed Clinical notes reviewed  HPI: Patient is a 74 year old male followed by Dr. Twylla for hematuria.  He was noted to have a PSA of 8.3.  He underwent an MRI of his prostate which I have reviewed.  There was a 1.8 peripheral zone nodule in the left posterior lateral apex.  There was signs of extracapsular extension and possible left neurovascular bundle involvement although these were soft findings on my review.  Also had a lesion at the transitional zone in the anterior mid gland 2.3 cm center to the right of midline.  There was no evidence of pelvic lymphadenopathy or bone metastasis.  There was a focal capsular bulge with indistinct margins which is what they are calling possible extracapsular extension although this is soft in my evaluation.  Patient is fairly asymptomatic specifically denies any increased lower urinary tract symptoms any bowel problems or bone pain.  He is seen today for radiation oncology opinion.  PLANNED TREATMENT REGIMEN: Image guided IMRT radiation therapy  PAST MEDICAL HISTORY:  has a past medical history of Cancer (HCC) (2018), Chest pain, Complication of anesthesia, Coronary artery disease, Heart attack (HCC), Hypercholesterolemia, Hyperlipidemia, Hypertension (2004), Ischemic heart disease (09/2002), Obesity, Preventative health care (10/02/2022), and Sleep apnea (2004).    PAST SURGICAL HISTORY:  Past Surgical  History:  Procedure Laterality Date   CARDIAC CATHETERIZATION  11/07/2007   CORONARY ARTERY BYPASS GRAFT N/A 12/06/2021   Procedure: CORONARY ARTERY BYPASS GRAFTING (CABG) X 3 BYPASSES USING LEFT INTERNAL MAMMARY ARTERY AND RIGHT LEG GREATER SAPHENOUS VEIN HARVESTED ENDOSCOPICALLY;  Surgeon: Kerrin Elspeth BROCKS, MD;  Location: [facility] OR;  Service: Open Heart Surgery;  Laterality: N/A;   HERNIA REPAIR     INGUINAL HERNIA REPAIR Right 09/22/2019   Procedure: RIGHT INGUINAL HERNIA WITH MESH;  Surgeon: Vanderbilt Ned, MD;  Location: [facility] OR;  Service: General;  Laterality: Right;  TAP BLOCK   LEFT HEART CATH AND CORONARY ANGIOGRAPHY N/A 11/29/2021   Procedure: LEFT HEART CATH AND CORONARY ANGIOGRAPHY;  Surgeon: Anner Alm ORN, MD;  Location: [facility] INVASIVE CV LAB;  Service: Cardiovascular;  Laterality: N/A;   TEE WITHOUT CARDIOVERSION N/A 03/16/2018   Procedure: TRANSESOPHAGEAL ECHOCARDIOGRAM (TEE);  Surgeon: Alveta Aleene PARAS, MD;  Location: [facility] ENDOSCOPY;  Service: Cardiovascular;  Laterality: N/A;   TEE WITHOUT CARDIOVERSION N/A 12/06/2021   Procedure: TRANSESOPHAGEAL ECHOCARDIOGRAM (TEE);  Surgeon: Kerrin Elspeth BROCKS, MD;  Location: [facility] OR;  Service: Open Heart Surgery;  Laterality: N/A;    FAMILY HISTORY: family history includes Cancer in his maternal grandmother and mother; Hearing loss in his father; Heart attack in his paternal grandfather; Parkinson's disease in his maternal grandfather; Stroke in his maternal grandfather and paternal grandmother; Varicose Veins in his father.  SOCIAL HISTORY:  reports that he has never smoked. He has never used smokeless tobacco. He reports current alcohol use of about 2.0 standard drinks of alcohol per week. He reports that he does  not use drugs.  ALLERGIES: Ace inhibitors, Beta adrenergic blockers, Codeine, Lisinopril , and Statins  MEDICATIONS:  Current Outpatient Medications  Medication Sig Dispense Refill   aspirin  EC 81 MG tablet Take 1 tablet (81 mg  total) by mouth daily.     clopidogrel  (PLAVIX ) 75 MG tablet TAKE 1 TABLET BY MOUTH EVERY DAY 30 tablet 0   ezetimibe  (ZETIA ) 10 MG tablet Take 1 tablet (10 mg total) by mouth daily. 30 tablet 3   tadalafil (CIALIS) 20 MG tablet Take 20 mg by mouth daily as needed for erectile dysfunction.     tamsulosin  (FLOMAX ) 0.4 MG CAPS capsule TAKE 1 CAPSULE BY MOUTH EVERY DAY 30 capsule 11   Cholecalciferol (VITAMIN D -3) 125 MCG (5000 UT) TABS Take 5,000 Units by mouth daily.      Coenzyme Q10 ([employer] COQ-10) 200 MG capsule Take 200 mg by mouth daily.     DHEA 25 MG CAPS Take 25 mg by mouth daily.      diazepam  (VALIUM ) 2 MG tablet 1 tab po 30 min prior to procedure (Patient not taking: Reported on 01/26/2024) 1 tablet 0   melatonin 5 MG TABS Take 5 mg by mouth at bedtime.     Menaquinone-7 (VITAMIN K2 PO) Take 150 mcg by mouth daily. MK-7     NON FORMULARY Take 1 capsule by mouth daily. HAIOTB Circulation Syn3rgy (Beet Root, L-Arginine, Horse Chestnut)     NP THYROID  60 MG tablet Take 60 mg by mouth every morning.     OVER THE COUNTER MEDICATION Take 2 capsules by mouth daily. [place]cadine grape capsules - 650 mg each - take 2 capsules by mouth daily     Pregnenolone Micronized (PREGNENOLONE PO) Take 75 mg by mouth daily.     QUERCETIN PO Take 1 capsule by mouth daily. Bio-Quercetin     testosterone  cypionate (DEPOTESTOSTERONE CYPIONATE) 200 MG/ML injection Inject 0.5 mLs into the skin once a week. Once a week     Turmeric 500 MG TABS Take 500 mg by mouth daily.     Zinc 50 MG TABS Take 50 mg by mouth daily.     No current facility-administered medications for this encounter.    ECOG PERFORMANCE STATUS:  0 - Asymptomatic  REVIEW OF SYSTEMS: Patient denies any weight loss, fatigue, weakness, fever, chills or night sweats. Patient denies any loss of vision, blurred vision. Patient denies any ringing  of the ears or hearing loss. No irregular heartbeat. Patient denies heart murmur or history of fainting.  Patient denies any chest pain or pain radiating to her upper extremities. Patient denies any shortness of breath, difficulty breathing at night, cough or hemoptysis. Patient denies any swelling in the lower legs. Patient denies any nausea vomiting, vomiting of blood, or coffee ground material in the vomitus. Patient denies any stomach pain. Patient states has had normal bowel movements no significant constipation or diarrhea. Patient denies any dysuria, hematuria or significant nocturia. Patient denies any problems walking, swelling in the joints or loss of balance. Patient denies any skin changes, loss of hair or loss of weight. Patient denies any excessive worrying or anxiety or significant depression. Patient denies any problems with insomnia. Patient denies excessive thirst, polyuria, polydipsia. Patient denies any swollen glands, patient denies easy bruising or easy bleeding. Patient denies any recent infections, allergies or URI. Patient s visual fields have not changed significantly in recent time.   PHYSICAL EXAM: BP (!) 159/87   Pulse 64   Temp (!) 96.8 F (36  C) (Tympanic)   Resp 15   Ht 5' 7 (1.702 m)   Wt 212 lb 8 oz (96.4 kg)   BMI 33.28 kg/m  Well-developed well-nourished patient in NAD. HEENT reveals PERLA, EOMI, discs not visualized.  Oral cavity is clear. No oral mucosal lesions are identified. Neck is clear without evidence of cervical or supraclavicular adenopathy. Lungs are clear to A&P. Cardiac examination is essentially unremarkable with regular rate and rhythm without murmur rub or thrill. Abdomen is benign with no organomegaly or masses noted. Motor sensory and DTR levels are equal and symmetric in the upper and lower extremities. Cranial nerves II through XII are grossly intact. Proprioception is intact. No peripheral adenopathy or edema is identified. No motor or sensory levels are noted. Crude visual fields are within normal range.  LABORATORY DATA: Pathology reports  reviewed    RADIOLOGY RESULTS: MRI scan reviewed compatible with above-stated findings   IMPRESSION: Clinical stage IIb Gleason 7 (3+4) adenocarcinoma the prostate presented with a PSA in the 8 range in 74 year old male  PLAN: I have run the [facility] nomogram showing possibility of 70% chance of extracapsular extension and only 12% chance of possible lymph node involvement.  I would opt to treat his prostate only and avoid his pelvic lymph nodes.  Would plan on delivering 80 Gray over 8 weeks using IMRT image guided treatment.  I would like to add Eligard 62-month depot although patient is reluctant based on what he is read about the i side effects.  I have asked urology to place fiducial markers for daily image guided treatment risks and benefits of treatment including increased lower Neri tract symptoms possible diarrhea fatigue alteration blood counts and slight skin reaction all were reviewed in detail with the patient.  He comprehends my treatment plan well.  We will set up simulation once markers are placed.  I would like to take this opportunity to thank you for allowing me to participate in the care of your patient...  Marcey Penton, MD         "

## 2024-02-09 ENCOUNTER — Encounter: Admitting: Urology

## 2024-02-11 ENCOUNTER — Ambulatory Visit

## 2024-03-08 ENCOUNTER — Encounter: Admitting: Urology

## 2024-03-10 ENCOUNTER — Ambulatory Visit

## 2024-04-12 ENCOUNTER — Ambulatory Visit: Admitting: Cardiovascular Disease

## 2024-08-18 ENCOUNTER — Ambulatory Visit

## 2024-08-19 ENCOUNTER — Ambulatory Visit
